# Patient Record
Sex: Female | Born: 1985 | Race: White | Hispanic: No | Marital: Married | Smoking: Never smoker
Health system: Southern US, Community
[De-identification: ages and names within clinical notes are randomized; demographics above are authoritative.]

## PROBLEM LIST (undated history)

## (undated) DIAGNOSIS — Z8659 Personal history of other mental and behavioral disorders: Secondary | ICD-10-CM

## (undated) DIAGNOSIS — F32A Depression, unspecified: Secondary | ICD-10-CM

## (undated) DIAGNOSIS — Z905 Acquired absence of kidney: Secondary | ICD-10-CM

## (undated) DIAGNOSIS — O24419 Gestational diabetes mellitus in pregnancy, unspecified control: Secondary | ICD-10-CM

## (undated) DIAGNOSIS — J353 Hypertrophy of tonsils with hypertrophy of adenoids: Secondary | ICD-10-CM

## (undated) DIAGNOSIS — A3790 Whooping cough, unspecified species without pneumonia: Secondary | ICD-10-CM

## (undated) DIAGNOSIS — F419 Anxiety disorder, unspecified: Secondary | ICD-10-CM

## (undated) DIAGNOSIS — Z862 Personal history of diseases of the blood and blood-forming organs and certain disorders involving the immune mechanism: Secondary | ICD-10-CM

## (undated) DIAGNOSIS — M8580 Other specified disorders of bone density and structure, unspecified site: Secondary | ICD-10-CM

## (undated) DIAGNOSIS — I10 Essential (primary) hypertension: Secondary | ICD-10-CM

## (undated) DIAGNOSIS — M1990 Unspecified osteoarthritis, unspecified site: Secondary | ICD-10-CM

## (undated) DIAGNOSIS — N898 Other specified noninflammatory disorders of vagina: Secondary | ICD-10-CM

## (undated) DIAGNOSIS — J329 Chronic sinusitis, unspecified: Secondary | ICD-10-CM

## (undated) DIAGNOSIS — F329 Major depressive disorder, single episode, unspecified: Secondary | ICD-10-CM

## (undated) DIAGNOSIS — N930 Postcoital and contact bleeding: Secondary | ICD-10-CM

## (undated) HISTORY — PX: PARTIAL NEPHRECTOMY: SHX414

## (undated) HISTORY — DX: Other specified noninflammatory disorders of vagina: N89.8

## (undated) HISTORY — DX: Whooping cough, unspecified species without pneumonia: A37.90

## (undated) HISTORY — DX: Postcoital and contact bleeding: N93.0

## (undated) HISTORY — DX: Gestational diabetes mellitus in pregnancy, unspecified control: O24.419

## (undated) HISTORY — DX: Other specified disorders of bone density and structure, unspecified site: M85.80

---
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
## 2007-05-30 ENCOUNTER — Ambulatory Visit: Payer: Self-pay | Admitting: Gastroenterology

## 2010-04-14 ENCOUNTER — Emergency Department (HOSPITAL_COMMUNITY): Admission: EM | Admit: 2010-04-14 | Discharge: 2010-04-14 | Payer: Self-pay | Admitting: Emergency Medicine

## 2010-04-23 ENCOUNTER — Ambulatory Visit (HOSPITAL_COMMUNITY)
Admission: RE | Admit: 2010-04-23 | Discharge: 2010-04-23 | Payer: Self-pay | Source: Home / Self Care | Admitting: General Surgery

## 2010-04-23 HISTORY — PX: CHOLECYSTECTOMY: SHX55

## 2010-07-01 ENCOUNTER — Other Ambulatory Visit: Admission: RE | Admit: 2010-07-01 | Discharge: 2010-07-01 | Payer: Self-pay | Admitting: Obstetrics and Gynecology

## 2011-01-27 ENCOUNTER — Emergency Department (HOSPITAL_COMMUNITY)
Admission: EM | Admit: 2011-01-27 | Discharge: 2011-01-27 | Disposition: A | Discharge status: Home / Self Care | Payer: PRIVATE HEALTH INSURANCE | Attending: Emergency Medicine | Admitting: Emergency Medicine

## 2011-01-27 DIAGNOSIS — H109 Unspecified conjunctivitis: Secondary | ICD-10-CM | POA: Insufficient documentation

## 2011-01-27 DIAGNOSIS — J40 Bronchitis, not specified as acute or chronic: Secondary | ICD-10-CM | POA: Insufficient documentation

## 2011-01-27 DIAGNOSIS — R059 Cough, unspecified: Secondary | ICD-10-CM | POA: Insufficient documentation

## 2011-01-27 DIAGNOSIS — J029 Acute pharyngitis, unspecified: Secondary | ICD-10-CM | POA: Insufficient documentation

## 2011-01-27 DIAGNOSIS — R05 Cough: Secondary | ICD-10-CM | POA: Insufficient documentation

## 2011-01-27 LAB — RAPID STREP SCREEN (MED CTR MEBANE ONLY): Streptococcus, Group A Screen (Direct): NEGATIVE

## 2011-02-21 LAB — BASIC METABOLIC PANEL
BUN: 9 mg/dL (ref 6–23)
CO2: 28 mEq/L (ref 19–32)
Calcium: 9.8 mg/dL (ref 8.4–10.5)
Chloride: 103 mEq/L (ref 96–112)
Creatinine, Ser: 0.73 mg/dL (ref 0.4–1.2)
GFR calc Af Amer: >60 mL/min (ref >60)
GFR calc non Af Amer: >60 mL/min (ref >60)
Glucose, Bld: 93 mg/dL (ref 70–99)
Potassium: 4.2 mEq/L (ref 3.5–5.1)
Sodium: 138 mEq/L (ref 135–145)

## 2011-02-21 LAB — CBC
HCT: 35.7 % — ABNORMAL LOW (ref 36.0–46.0)
Hemoglobin: 12.6 g/dL (ref 12.0–15.0)
MCHC: 35.4 g/dL (ref 30.0–36.0)
MCV: 88.8 fL (ref 78.0–100.0)
Platelets: 279 10*3/uL (ref 150–400)
RBC: 4.02 MIL/uL (ref 3.87–5.11)
RDW: 13.2 % (ref 11.5–15.5)
WBC: 7.9 10*3/uL (ref 4.0–10.5)

## 2011-02-21 LAB — HCG, QUANTITATIVE, PREGNANCY: hCG, Beta Chain, Quant, S: <2 m[IU]/mL (ref <5)

## 2011-02-22 LAB — DIFFERENTIAL
Basophils Absolute: 0 10*3/uL (ref 0.0–0.1)
Basophils Relative: 0 % (ref 0–1)
Eosinophils Absolute: 0 10*3/uL (ref 0.0–0.7)
Eosinophils Relative: 0 % (ref 0–5)
Lymphocytes Relative: 16 % (ref 12–46)
Lymphs Abs: 1.8 10*3/uL (ref 0.7–4.0)
Monocytes Absolute: 0.8 10*3/uL (ref 0.1–1.0)
Monocytes Relative: 7 % (ref 3–12)
Neutro Abs: 8.6 10*3/uL — ABNORMAL HIGH (ref 1.7–7.7)
Neutrophils Relative %: 77 % (ref 43–77)

## 2011-02-22 LAB — URINALYSIS, ROUTINE W REFLEX MICROSCOPIC
Bilirubin Urine: NEGATIVE
Glucose, UA: NEGATIVE mg/dL
Hgb urine dipstick: NEGATIVE
Ketones, ur: NEGATIVE mg/dL
Nitrite: NEGATIVE
Protein, ur: NEGATIVE mg/dL
Specific Gravity, Urine: 1.02 (ref 1.005–1.030)
Urobilinogen, UA: 0.2 mg/dL (ref 0.0–1.0)
pH: 7 (ref 5.0–8.0)

## 2011-02-22 LAB — COMPREHENSIVE METABOLIC PANEL
ALT: 150 U/L — ABNORMAL HIGH (ref 0–35)
AST: 314 U/L — ABNORMAL HIGH (ref 0–37)
Albumin: 4 g/dL (ref 3.5–5.2)
Alkaline Phosphatase: 185 U/L — ABNORMAL HIGH (ref 39–117)
BUN: 16 mg/dL (ref 6–23)
CO2: 23 mEq/L (ref 19–32)
Calcium: 9.3 mg/dL (ref 8.4–10.5)
Chloride: 107 mEq/L (ref 96–112)
Creatinine, Ser: 0.87 mg/dL (ref 0.4–1.2)
GFR calc Af Amer: >60 mL/min (ref >60)
GFR calc non Af Amer: >60 mL/min (ref >60)
Glucose, Bld: 119 mg/dL — ABNORMAL HIGH (ref 70–99)
Potassium: 3.7 mEq/L (ref 3.5–5.1)
Sodium: 140 mEq/L (ref 135–145)
Total Bilirubin: 0.5 mg/dL (ref 0.3–1.2)
Total Protein: 6.7 g/dL (ref 6.0–8.3)

## 2011-02-22 LAB — CBC
HCT: 36.5 % (ref 36.0–46.0)
Hemoglobin: 12.7 g/dL (ref 12.0–15.0)
MCHC: 34.9 g/dL (ref 30.0–36.0)
MCV: 88.8 fL (ref 78.0–100.0)
Platelets: 231 10*3/uL (ref 150–400)
RBC: 4.11 MIL/uL (ref 3.87–5.11)
RDW: 13 % (ref 11.5–15.5)
WBC: 11.2 10*3/uL — ABNORMAL HIGH (ref 4.0–10.5)

## 2011-02-22 LAB — POCT PREGNANCY, URINE: Preg Test, Ur: NEGATIVE

## 2011-02-22 LAB — LIPASE, BLOOD: Lipase: 32 U/L (ref 11–59)

## 2011-04-19 NOTE — Assessment & Plan Note (Signed)
NAMEFYNLEE, ROWLANDS                CHART#:  [id number]   DATE:  05/30/2007                       DOB:  [birth year]   REASON FOR VISIT:  Constipation.   HISTORY OF PRESENT ILLNESS:  [name] is an 25 year old female who has  had constipation since her early teens.  She has no particular triggers.  She has 1-2 bowel movements a week unless she has her menstrual cycle.  Her menstrual cycle occurs every 4 weeks. It usually lasts 5 days.  During her menstrual cycle she has soft stool to watery stool.  No  particular foods cause her to have watery stool.  She only sees blood in  her stool when she strains very hard to have a bowel movement.  When she  does have a bowel movement it comes out like little itty bitty balls.  She has never been tried on any fiber supplements.  She drinks alot of  juices.  She drinks 2 glasses of water a day.  She eats a lot of fresh  fruit like pineapples, plums, peaches and vegetables as well.  She  denies any weight loss.  She has no problem swallowing. She has  heartburn 2 times a week.  She does not take any medicine for it.  She  has no nausea or vomiting.  The constipation can cause moderate to  severe abdominal cramping.  She can have abdominal cramping at night  time.  She has never tried MiraLax.  She did try Dulcolax once.  She  took 2 at night time and had a watery stool the next day.  Otherwise she  does not have any problems with stools at night time.  She has not had  any change in her level of stress.  She does not have any black stools.   PAST MEDICAL HISTORY:  She denies any medical problems or any history of  thyroid disease.   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   ALLERGIES:  Morphine caused rash.   MEDICATIONS:  1. Ortho Tri-Cyclen lo.  2. Claritin.   She denies any consumption of over-the-counter medicines or herbal  supplements.   FAMILY HISTORY:  Her mother has irritable bowel and diverticulitis.  Her  aunts have irritable bowel.  Her grandmother has diverticulosis.  She is  any only child.  Father does not have any medical problems.   SOCIAL HISTORY:  She is engaged.  Her fiance told her that she should  not take laxatives.  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  She denies  any tobacco or alcohol products.   REVIEW OF SYSTEMS:  Per HPI otherwise all systems are negative.   PHYSICAL EXAMINATION:  VITALS:  Weight 128-1/2 pounds.  Height 5 feet 3  inches.  BMI 22.7 (healthy), temperature 98.5, blood pressure 120/70,  pulse 56.  GENERAL:  She is in no apparent distress.  Alert and oriented x4.  HEENT:  Atraumatic, normocephalic.  Pupils equal and reactive to light.  Mouth, no oral lesions.  Posterior pharynx without erythema or exudate.  Mouth has moist mucosa.  NECK:  Has full range of motion, no  lymphadenopathy.  LUNGS:  Clear to auscultation bilaterally.  CARDIOVASCULAR:  Regular rate and rhythm, no murmur, normal S1/S2.  ABDOMEN:  Bowel sounds are present, soft, nontender, nondistended, no  rebound or guarding.  No hepatosplenomegaly, abdominal bruits or  pulsatile masses.  EXTREMITIES:  Without cyanosis, clubbing or edema.  NEURO:  She has no focal or neurologic deficits.   ASSESSMENT:  [name] is an 25 year old female with constipation which  gets better around her menstrual cycle which is consistent with  irritable bowel syndrome.  The differential diagnosis includes bowel  irregularities associated with thyroid disease and a low likelihood of  celiac disease.  It was a pleasure seeing Ms. Hyler in the office today.   PLAN:  1. [name] is given a hand out on high fiber diet.  She is asked to      achieve a goal of 20-30 grams of fiber a day.  She is warned that      some fiber may cause bloating.  She is asked to avoid those fiber      items that may cause bloating.  She is asked  to initiate MiraLax      daily.  If she is not having a bowel movement every other day then      is to increase it to twice a day.  2. She is to ensure that she is drinking 6-8 cups of water or juice a      day.  3. She is to call me in 3 weeks.  If she is not achieving a      satisfactory bowel movement then I will consider adding Amitiza.  4. She has a return patient visit to see me in 6 weeks.  I will call      her with results  of her thyroid test and her serum quantitative      immunoglobulin as well as tissue transglutaminase IGA.       Kassie Mends, M.D.  Electronically Signed     SM/MEDQ  D:  05/30/2007  T:  05/31/2007  Job:  [id number]

## 2011-12-06 DIAGNOSIS — Z862 Personal history of diseases of the blood and blood-forming organs and certain disorders involving the immune mechanism: Secondary | ICD-10-CM

## 2011-12-06 HISTORY — DX: Personal history of diseases of the blood and blood-forming organs and certain disorders involving the immune mechanism: Z86.2

## 2012-07-03 ENCOUNTER — Other Ambulatory Visit (HOSPITAL_COMMUNITY)
Admission: RE | Admit: 2012-07-03 | Discharge: 2012-07-03 | Disposition: A | Discharge status: Home / Self Care | Payer: PRIVATE HEALTH INSURANCE | Source: Ambulatory Visit | Attending: Obstetrics and Gynecology | Admitting: Obstetrics and Gynecology

## 2012-07-03 ENCOUNTER — Other Ambulatory Visit: Payer: Self-pay | Admitting: Adult Health

## 2012-07-03 DIAGNOSIS — Z01419 Encounter for gynecological examination (general) (routine) without abnormal findings: Secondary | ICD-10-CM | POA: Insufficient documentation

## 2013-01-05 DIAGNOSIS — J353 Hypertrophy of tonsils with hypertrophy of adenoids: Secondary | ICD-10-CM

## 2013-01-05 HISTORY — DX: Hypertrophy of tonsils with hypertrophy of adenoids: J35.3

## 2013-01-17 ENCOUNTER — Ambulatory Visit (INDEPENDENT_AMBULATORY_CARE_PROVIDER_SITE_OTHER): Payer: PRIVATE HEALTH INSURANCE | Admitting: Otolaryngology

## 2013-01-24 ENCOUNTER — Ambulatory Visit (INDEPENDENT_AMBULATORY_CARE_PROVIDER_SITE_OTHER): Payer: No Typology Code available for payment source | Admitting: Otolaryngology

## 2013-01-24 DIAGNOSIS — J3501 Chronic tonsillitis: Secondary | ICD-10-CM

## 2013-01-24 DIAGNOSIS — J0100 Acute maxillary sinusitis, unspecified: Secondary | ICD-10-CM

## 2013-01-24 DIAGNOSIS — J310 Chronic rhinitis: Secondary | ICD-10-CM

## 2013-01-25 ENCOUNTER — Encounter (HOSPITAL_BASED_OUTPATIENT_CLINIC_OR_DEPARTMENT_OTHER): Payer: Self-pay | Admitting: *Deleted

## 2013-01-25 DIAGNOSIS — J329 Chronic sinusitis, unspecified: Secondary | ICD-10-CM

## 2013-01-25 HISTORY — DX: Chronic sinusitis, unspecified: J32.9

## 2013-01-29 ENCOUNTER — Ambulatory Visit (HOSPITAL_BASED_OUTPATIENT_CLINIC_OR_DEPARTMENT_OTHER): Payer: No Typology Code available for payment source | Admitting: Anesthesiology

## 2013-01-29 ENCOUNTER — Encounter (HOSPITAL_BASED_OUTPATIENT_CLINIC_OR_DEPARTMENT_OTHER)
Admission: RE | Disposition: A | Discharge status: Home / Self Care | Payer: Self-pay | Source: Ambulatory Visit | Attending: Otolaryngology

## 2013-01-29 ENCOUNTER — Encounter (HOSPITAL_BASED_OUTPATIENT_CLINIC_OR_DEPARTMENT_OTHER): Payer: Self-pay

## 2013-01-29 ENCOUNTER — Ambulatory Visit (HOSPITAL_BASED_OUTPATIENT_CLINIC_OR_DEPARTMENT_OTHER)
Admission: RE | Admit: 2013-01-29 | Discharge: 2013-01-29 | Disposition: A | Discharge status: Home / Self Care | Payer: No Typology Code available for payment source | Source: Ambulatory Visit | Attending: Otolaryngology | Admitting: Otolaryngology

## 2013-01-29 ENCOUNTER — Encounter (HOSPITAL_BASED_OUTPATIENT_CLINIC_OR_DEPARTMENT_OTHER): Payer: Self-pay | Admitting: Anesthesiology

## 2013-01-29 DIAGNOSIS — F3289 Other specified depressive episodes: Secondary | ICD-10-CM | POA: Insufficient documentation

## 2013-01-29 DIAGNOSIS — J3501 Chronic tonsillitis: Secondary | ICD-10-CM | POA: Insufficient documentation

## 2013-01-29 DIAGNOSIS — J029 Acute pharyngitis, unspecified: Secondary | ICD-10-CM | POA: Insufficient documentation

## 2013-01-29 DIAGNOSIS — F329 Major depressive disorder, single episode, unspecified: Secondary | ICD-10-CM | POA: Insufficient documentation

## 2013-01-29 DIAGNOSIS — D649 Anemia, unspecified: Secondary | ICD-10-CM | POA: Insufficient documentation

## 2013-01-29 DIAGNOSIS — J329 Chronic sinusitis, unspecified: Secondary | ICD-10-CM | POA: Insufficient documentation

## 2013-01-29 DIAGNOSIS — Z9089 Acquired absence of other organs: Secondary | ICD-10-CM

## 2013-01-29 HISTORY — DX: Personal history of other mental and behavioral disorders: Z86.59

## 2013-01-29 HISTORY — DX: Acquired absence of kidney: Z90.5

## 2013-01-29 HISTORY — DX: Hypertrophy of tonsils with hypertrophy of adenoids: J35.3

## 2013-01-29 HISTORY — PX: TONSILLECTOMY AND ADENOIDECTOMY: SHX28

## 2013-01-29 HISTORY — DX: Personal history of diseases of the blood and blood-forming organs and certain disorders involving the immune mechanism: Z86.2

## 2013-01-29 HISTORY — DX: Chronic sinusitis, unspecified: J32.9

## 2013-01-29 LAB — POCT HEMOGLOBIN-HEMACUE: Hemoglobin: 13.6 g/dL (ref 12.0–15.0)

## 2013-01-29 SURGERY — TONSILLECTOMY AND ADENOIDECTOMY
Anesthesia: General | Site: Throat | Wound class: Clean Contaminated

## 2013-01-29 MED ORDER — FENTANYL CITRATE 0.05 MG/ML IJ SOLN
INTRAMUSCULAR | Status: DC | PRN
  Administered 2013-01-29: 100 ug via INTRAVENOUS

## 2013-01-29 MED ORDER — LACTATED RINGERS IV SOLN
INTRAVENOUS | Status: DC
  Administered 2013-01-29: 20 mL/h via INTRAVENOUS
  Administered 2013-01-29: 08:00:00 via INTRAVENOUS

## 2013-01-29 MED ORDER — PROPOFOL 10 MG/ML IV BOLUS
INTRAVENOUS | Status: DC | PRN
  Administered 2013-01-29: 120 mg via INTRAVENOUS

## 2013-01-29 MED ORDER — OXYCODONE HCL 5 MG/5ML PO SOLN
5.0000 mg | Freq: Once | ORAL | Status: AC | PRN
  Administered 2013-01-29: 5 mg via ORAL

## 2013-01-29 MED ORDER — OXYCODONE-ACETAMINOPHEN 5-325 MG/5ML PO SOLN: 5.0000 mL | ORAL | Status: DC | PRN

## 2013-01-29 MED ORDER — SODIUM CHLORIDE 0.9 % IR SOLN
Status: DC | PRN
  Administered 2013-01-29: 1

## 2013-01-29 MED ORDER — BACITRACIN ZINC 500 UNIT/GM EX OINT
TOPICAL_OINTMENT | CUTANEOUS | Status: DC | PRN
  Administered 2013-01-29: 1 via TOPICAL

## 2013-01-29 MED ORDER — MIDAZOLAM HCL 2 MG/ML PO SYRP: 12.0000 mg | ORAL_SOLUTION | Freq: Once | ORAL | Status: DC | PRN

## 2013-01-29 MED ORDER — HYDROMORPHONE HCL PF 1 MG/ML IJ SOLN
0.2500 mg | INTRAMUSCULAR | Status: DC | PRN
  Administered 2013-01-29 (×2): 0.5 mg via INTRAVENOUS

## 2013-01-29 MED ORDER — DEXAMETHASONE SODIUM PHOSPHATE 4 MG/ML IJ SOLN
INTRAMUSCULAR | Status: DC | PRN
  Administered 2013-01-29: 10 mg via INTRAVENOUS

## 2013-01-29 MED ORDER — FENTANYL CITRATE 0.05 MG/ML IJ SOLN: 50.0000 ug | INTRAMUSCULAR | Status: DC | PRN

## 2013-01-29 MED ORDER — ONDANSETRON HCL 4 MG/2ML IJ SOLN: 4.0000 mg | Freq: Once | INTRAMUSCULAR | Status: DC | PRN

## 2013-01-29 MED ORDER — OXYMETAZOLINE HCL 0.05 % NA SOLN
NASAL | Status: DC | PRN
  Administered 2013-01-29: 1 via NASAL

## 2013-01-29 MED ORDER — ONDANSETRON HCL 4 MG/2ML IJ SOLN
INTRAMUSCULAR | Status: DC | PRN
  Administered 2013-01-29: 4 mg via INTRAVENOUS

## 2013-01-29 MED ORDER — MIDAZOLAM HCL 5 MG/5ML IJ SOLN
INTRAMUSCULAR | Status: DC | PRN
  Administered 2013-01-29: 2 mg via INTRAVENOUS

## 2013-01-29 MED ORDER — SUCCINYLCHOLINE CHLORIDE 20 MG/ML IJ SOLN
INTRAMUSCULAR | Status: DC | PRN
  Administered 2013-01-29: 100 mg via INTRAVENOUS

## 2013-01-29 MED ORDER — MIDAZOLAM HCL 2 MG/2ML IJ SOLN: 1.0000 mg | INTRAMUSCULAR | Status: DC | PRN

## 2013-01-29 MED ORDER — OXYCODONE HCL 5 MG PO TABS: 5.0000 mg | ORAL_TABLET | Freq: Once | ORAL | Status: AC | PRN

## 2013-01-29 SURGICAL SUPPLY — 34 items
BANDAGE COBAN STERILE 2 (GAUZE/BANDAGES/DRESSINGS) IMPLANT
CANISTER SUCTION 1200CC (MISCELLANEOUS) ×2 IMPLANT
CATH ROBINSON RED A/P 10FR (CATHETERS) IMPLANT
CATH ROBINSON RED A/P 14FR (CATHETERS) IMPLANT
CLOTH BEACON ORANGE TIMEOUT ST (SAFETY) ×2 IMPLANT
COAGULATOR SUCT SWTCH 10FR 6 (ELECTROSURGICAL) IMPLANT
COVER MAYO STAND STRL (DRAPES) ×2 IMPLANT
ELECT REM PT RETURN 9FT ADLT (ELECTROSURGICAL) ×2
ELECT REM PT RETURN 9FT PED (ELECTROSURGICAL)
ELECTRODE REM PT RETRN 9FT PED (ELECTROSURGICAL) IMPLANT
ELECTRODE REM PT RTRN 9FT ADLT (ELECTROSURGICAL) IMPLANT
GAUZE SPONGE 4X4 12PLY STRL LF (GAUZE/BANDAGES/DRESSINGS) ×2 IMPLANT
GLOVE BIO SURGEON STRL SZ7 (GLOVE) ×1 IMPLANT
GLOVE BIO SURGEON STRL SZ7.5 (GLOVE) ×2 IMPLANT
GLOVE BIOGEL PI IND STRL 7.0 (GLOVE) IMPLANT
GLOVE BIOGEL PI INDICATOR 7.0 (GLOVE) ×1
GLOVE SKINSENSE NS SZ7.0 (GLOVE) ×1
GLOVE SKINSENSE STRL SZ7.0 (GLOVE) IMPLANT
GOWN PREVENTION PLUS XLARGE (GOWN DISPOSABLE) ×4 IMPLANT
IV NS 500ML (IV SOLUTION) ×2
IV NS 500ML BAXH (IV SOLUTION) ×1 IMPLANT
MARKER SKIN DUAL TIP RULER LAB (MISCELLANEOUS) IMPLANT
NS IRRIG 1000ML POUR BTL (IV SOLUTION) ×2 IMPLANT
SHEET MEDIUM DRAPE 40X70 STRL (DRAPES) ×2 IMPLANT
SOLUTION BUTLER CLEAR DIP (MISCELLANEOUS) ×3 IMPLANT
SPONGE TONSIL 1 RF SGL (DISPOSABLE) IMPLANT
SPONGE TONSIL 1.25 RF SGL STRG (GAUZE/BANDAGES/DRESSINGS) ×1 IMPLANT
SYR BULB 3OZ (MISCELLANEOUS) IMPLANT
TOWEL OR 17X24 6PK STRL BLUE (TOWEL DISPOSABLE) ×2 IMPLANT
TUBE CONNECTING 20X1/4 (TUBING) ×2 IMPLANT
TUBE SALEM SUMP 12R W/ARV (TUBING) IMPLANT
TUBE SALEM SUMP 16 FR W/ARV (TUBING) IMPLANT
WAND COBLATOR 70 EVAC XTRA (SURGICAL WAND) ×2 IMPLANT
WATER STERILE IRR 1000ML POUR (IV SOLUTION) ×1 IMPLANT

## 2013-01-29 NOTE — Anesthesia Postprocedure Evaluation (Signed)
  Anesthesia Post-op Note  Patient: Sandra Glover  Procedure(s) Performed: Procedure(s): TONSILLECTOMY AND ADENOIDECTOMY (N/A)  Patient Location: PACU  Anesthesia Type:General  Level of Consciousness: awake and alert   Airway and Oxygen Therapy: Patient Spontanous Breathing and Patient connected to face mask oxygen  Post-op Pain: none  Post-op Assessment: Post-op Vital signs reviewed  Post-op Vital Signs: Reviewed  Complications: No apparent anesthesia complications

## 2013-01-29 NOTE — Transfer of Care (Signed)
Immediate Anesthesia Transfer of Care Note  Patient: Sandra Glover  Procedure(s) Performed: Procedure(s): TONSILLECTOMY AND ADENOIDECTOMY (N/A)  Patient Location: PACU  Anesthesia Type:General  Level of Consciousness: awake, alert  and oriented  Airway & Oxygen Therapy: Patient Spontanous Breathing and Patient connected to face mask oxygen  Post-op Assessment: Report given to PACU RN and Post -op Vital signs reviewed and stable  Post vital signs: Reviewed and stable  Complications: No apparent anesthesia complications

## 2013-01-29 NOTE — H&P (Signed)
  H&P Update  Pt's original H&P dated 01/24/13 reviewed and placed in chart (to be scanned).  I personally examined the patient today.  No change in health. Proceed with adenotonsillectomy.

## 2013-01-29 NOTE — Anesthesia Preprocedure Evaluation (Signed)
Anesthesia Evaluation  Patient identified by MRN, date of birth, ID band Patient awake    Reviewed: Allergy & Precautions, H&P , NPO status , Patient's Chart, lab work & pertinent test results  Airway Mallampati: I TM Distance: >3 FB Neck ROM: Full    Dental  (+) Teeth Intact and Dental Advisory Given   Pulmonary  breath sounds clear to auscultation        Cardiovascular Rhythm:Regular Rate:Normal     Neuro/Psych    GI/Hepatic   Endo/Other    Renal/GU      Musculoskeletal   Abdominal   Peds  Hematology   Anesthesia Other Findings   Reproductive/Obstetrics                           Anesthesia Physical Anesthesia Plan  ASA: I  Anesthesia Plan: General   Post-op Pain Management:    Induction: Intravenous  Airway Management Planned: Oral ETT  Additional Equipment:   Intra-op Plan:   Post-operative Plan: Extubation in OR  Informed Consent: I have reviewed the patients History and Physical, chart, labs and discussed the procedure including the risks, benefits and alternatives for the proposed anesthesia with the patient or authorized representative who has indicated his/her understanding and acceptance.   Dental advisory given  Plan Discussed with: CRNA and Anesthesiologist  Anesthesia Plan Comments:         Anesthesia Quick Evaluation

## 2013-01-29 NOTE — Brief Op Note (Signed)
01/29/2013  9:01 AM  PATIENT:  Sandra Glover  27 y.o. female  PRE-OPERATIVE DIAGNOSIS:  ADENOID/TONSILLAR HYPERTROPHY, CHRONIC TONSILLITIS/PHARYNGITIS  POST-OPERATIVE DIAGNOSIS:  ADENOID/TONSILLAR HYPERTROPHY, CHRONIC TONSILLITIS/PHARYNGITIS  PROCEDURE:  Procedure(s): TONSILLECTOMY AND ADENOIDECTOMY (N/A)  SURGEON:  Surgeon(s) and Role:    * Darletta Moll, MD - Primary  PHYSICIAN ASSISTANT:   ASSISTANTS: none   ANESTHESIA:   general  EBL:  Total I/O In: 700 [I.V.:700] Out: -   BLOOD ADMINISTERED:none  DRAINS: none   LOCAL MEDICATIONS USED:  NONE  SPECIMEN:  Source of Specimen:  bilateral tonsils  DISPOSITION OF SPECIMEN:  PATHOLOGY  COUNTS:  YES  TOURNIQUET:  * No tourniquets in log *  DICTATION: .Note written in EPIC  PLAN OF CARE: Discharge to home after PACU  PATIENT DISPOSITION:  PACU - hemodynamically stable.   Delay start of Pharmacological VTE agent (>24hrs) due to surgical blood loss or risk of bleeding: not applicable

## 2013-01-29 NOTE — Anesthesia Procedure Notes (Signed)
Procedure Name: Intubation Date/Time: 01/29/2013 8:37 AM Performed by: Burna Cash Pre-anesthesia Checklist: Patient identified, Emergency Drugs available, Suction available and Patient being monitored Patient Re-evaluated:Patient Re-evaluated prior to inductionOxygen Delivery Method: Circle System Utilized Preoxygenation: Pre-oxygenation with 100% oxygen Intubation Type: IV induction Ventilation: Mask ventilation without difficulty Laryngoscope Size: Mac and 3 Grade View: Grade I Tube type: Oral Tube size: 7.0 mm Number of attempts: 1 Airway Equipment and Method: stylet and oral airway Placement Confirmation: ETT inserted through vocal cords under direct vision,  positive ETCO2 and breath sounds checked- equal and bilateral Secured at: 20 cm Tube secured with: Tape Dental Injury: Teeth and Oropharynx as per pre-operative assessment

## 2013-01-29 NOTE — Op Note (Signed)
DATE OF PROCEDURE:  01/29/2013                              OPERATIVE REPORT  SURGEON:  Newman Pies, MD  PREOPERATIVE DIAGNOSES: 1. Adenotonsillar hypertrophy. 2. Chronic tonsillitis and pharyngitis  POSTOPERATIVE DIAGNOSES: 1. Adenotonsillar hypertrophy. 2. Chronic tonsillitis and pharyngitis  PROCEDURE PERFORMED:  Adenotonsillectomy.  ANESTHESIA:  General endotracheal tube anesthesia.  COMPLICATIONS:  None.  ESTIMATED BLOOD LOSS:  Minimal.  INDICATION FOR PROCEDURE:  Sandra Glover is a 27 y.o. female with a history of chronic tonsillitis/pharyngitis and halitosis.  According to the patient, she has been experiencing chronic throat discomfort with halitosis for several years. The patient continues to be symptomatic despite medical treatments. On examination, the patient was noted to have bilateral cryptic tonsils, with numerous tonsilloliths. Based on the above findings, the decision was made for the patient to undergo the adenotonsillectomy procedure. Likelihood of success in reducing symptoms was also discussed.  The risks, benefits, alternatives, and details of the procedure were discussed with the mother.  Questions were invited and answered.  Informed consent was obtained.  DESCRIPTION:  The patient was taken to the operating room and placed supine on the operating table.  General endotracheal tube anesthesia was administered by the anesthesiologist.  The patient was positioned and prepped and draped in a standard fashion for adenotonsillectomy.  A Crowe-Davis mouth gag was inserted into the oral cavity for exposure. 2+ cryptic tonsils were noted bilaterally.  No bifidity was noted.  Indirect mirror examination of the nasopharynx revealed mild adenoid hypertrophy. The adenoid was ablated with the Coblator device. Hemostasis was achieved with the Coblator device.  The right tonsil was then grasped with a straight Allis clamp and retracted medially.  It was resected free from the  underlying pharyngeal constrictor muscles with the Coblator device.  The same procedure was repeated on the left side without exception.  The surgical sites were copiously irrigated.  The mouth gag was removed.  The care of the patient was turned over to the anesthesiologist.  The patient was awakened from anesthesia without difficulty.  The patient was extubated and transferred to the recovery room in good condition.  OPERATIVE FINDINGS:  Adenotonsillar hypertrophy.  SPECIMEN:  Bilateral tonsils  FOLLOWUP CARE:  The patient will be discharged home once awake and alert.  She will be placed on amoxicillin 800 mg p.o. b.i.d. for 5 days, and Roxicet 5-1ml po q 4 hours for postop pain control.   The patient will follow up in my office in approximately 2 weeks.  Ananias Kolander,SUI W 01/29/2013 9:03 AM

## 2013-01-30 ENCOUNTER — Encounter (HOSPITAL_BASED_OUTPATIENT_CLINIC_OR_DEPARTMENT_OTHER): Payer: Self-pay | Admitting: Otolaryngology

## 2013-02-14 ENCOUNTER — Ambulatory Visit (INDEPENDENT_AMBULATORY_CARE_PROVIDER_SITE_OTHER): Payer: No Typology Code available for payment source | Admitting: Otolaryngology

## 2013-06-13 ENCOUNTER — Encounter: Payer: Self-pay | Admitting: Obstetrics & Gynecology

## 2013-06-13 ENCOUNTER — Ambulatory Visit (INDEPENDENT_AMBULATORY_CARE_PROVIDER_SITE_OTHER): Payer: No Typology Code available for payment source | Admitting: Obstetrics & Gynecology

## 2013-06-13 VITALS — BP 100/70 | Wt 138.0 lb

## 2013-06-13 DIAGNOSIS — N925 Other specified irregular menstruation: Secondary | ICD-10-CM | POA: Insufficient documentation

## 2013-06-13 DIAGNOSIS — N949 Unspecified condition associated with female genital organs and menstrual cycle: Secondary | ICD-10-CM | POA: Insufficient documentation

## 2013-06-13 DIAGNOSIS — N938 Other specified abnormal uterine and vaginal bleeding: Secondary | ICD-10-CM | POA: Insufficient documentation

## 2013-06-13 LAB — POCT HEMOGLOBIN: Hemoglobin: 11.7 g/dL — AB (ref 12.2–16.2)

## 2013-06-13 MED ORDER — DESOGESTREL-ETHINYL ESTRADIOL 0.15-0.02/0.01 MG (21/5) PO TABS: 1.0000 | ORAL_TABLET | Freq: Every day | ORAL | Status: DC

## 2013-06-13 NOTE — Addendum Note (Signed)
Addended by: Richardson Chiquito on: 06/13/2013 02:52 PM   Modules accepted: Orders

## 2013-06-13 NOTE — Progress Notes (Signed)
Patient ID: Sandra Glover, female   DOB: 03-15-1986, 27 y.o.   MRN: [id number] [name] is in for a followup from bleeding with her nexplanon Essentially for the last 3 months She's been placed on Mircette with positive results over the past 2 weeks She really had been having regular periods prior to that without any problems  She also is having more left lower quadrant pain associated since the bleeding became essentially constant  I can't argue with results if she has not bleeding Mircette or have her continuing for 4 months I would then empirically stop it and see if she'll go back to a more acceptable bleeding pattern  She has any problems with it in the meantime or after she stop she can certainly recontact We will take it out next year in any event at which time she plans and stopping all birth control method

## 2013-06-13 NOTE — Patient Instructions (Signed)

## 2013-07-09 ENCOUNTER — Ambulatory Visit (INDEPENDENT_AMBULATORY_CARE_PROVIDER_SITE_OTHER): Payer: No Typology Code available for payment source | Admitting: Adult Health

## 2013-07-09 ENCOUNTER — Other Ambulatory Visit (HOSPITAL_COMMUNITY)
Admission: RE | Admit: 2013-07-09 | Discharge: 2013-07-09 | Disposition: A | Discharge status: Home / Self Care | Payer: No Typology Code available for payment source | Source: Ambulatory Visit | Attending: Obstetrics and Gynecology | Admitting: Obstetrics and Gynecology

## 2013-07-09 ENCOUNTER — Encounter: Payer: Self-pay | Admitting: Adult Health

## 2013-07-09 VITALS — BP 110/68 | HR 72 | Ht 63.0 in | Wt 138.5 lb

## 2013-07-09 DIAGNOSIS — Z01419 Encounter for gynecological examination (general) (routine) without abnormal findings: Secondary | ICD-10-CM

## 2013-07-09 NOTE — Progress Notes (Signed)
Patient ID: Sandra Glover, female   DOB: 1986/01/27, 27 y.o.   MRN: [id number] History of Present Illness: Sandra Glover is a 27 year old white female in for a pap and physical. No complaints today.   Current Medications, Allergies, Past Medical History, Past Surgical History, Family History and Social History were reviewed in [employer] record.     Review of Systems: Patient denies any headaches, blurred vision, shortness of breath, chest pain, abdominal pain, problems with bowel movements, urination, or intercourse. No joint pain or swelling or mood changes.She had some irregular bleeding with Implanon and was give the pill and the bleeding has stopped.The Implanon is due to be removed in 05/2014.    Physical Exam:BP 110/68  Pulse 72  Ht 5\' 3"  (1.6 m)  Wt 138 lb 8 oz (62.823 kg)  BMI 24.54 kg/m2  LMP 06/22/2013 General:  Well developed, well nourished, no acute distress Skin:  Warm and dry Neck:  Midline trachea, normal thyroid Lungs; Clear to auscultation bilaterally Breast:  No dominant palpable mass, retraction, or nipple discharge Cardiovascular: Regular rate and rhythm Abdomen:  Soft, non tender, no hepatosplenomegaly Pelvic:  External genitalia is normal in appearance.  The vagina is normal in appearance.  The cervix is bulbous.Pap performed with thin prep.  Uterus is felt to be normal size, shape, and contour.  No adnexal masses or tenderness noted. Extremities:  No swelling or varicosities noted Psych: alert and cooperative, seems happy   Impression: Yearly gyn exam    Plan: Physical in 1 year Call prn problems

## 2013-07-09 NOTE — Patient Instructions (Addendum)
Physical in 1 year Get implanon removed 6/15 Call with any problems

## 2013-10-17 ENCOUNTER — Encounter: Payer: Self-pay | Admitting: Obstetrics & Gynecology

## 2013-10-17 ENCOUNTER — Encounter (INDEPENDENT_AMBULATORY_CARE_PROVIDER_SITE_OTHER): Payer: Self-pay

## 2013-10-17 ENCOUNTER — Ambulatory Visit (INDEPENDENT_AMBULATORY_CARE_PROVIDER_SITE_OTHER): Payer: No Typology Code available for payment source | Admitting: Obstetrics & Gynecology

## 2013-10-17 VITALS — BP 100/80 | Wt 138.0 lb

## 2013-10-17 DIAGNOSIS — N925 Other specified irregular menstruation: Secondary | ICD-10-CM

## 2013-10-17 DIAGNOSIS — Z3046 Encounter for surveillance of implantable subdermal contraceptive: Secondary | ICD-10-CM

## 2013-10-17 DIAGNOSIS — N949 Unspecified condition associated with female genital organs and menstrual cycle: Secondary | ICD-10-CM

## 2013-10-17 NOTE — Progress Notes (Signed)
Patient ID: Sandra Glover, female   DOB: [birth year], 27 y.o.   MRN: [id number] Pt wants nexplanon removed, wants to get pregnant!  Left arm prepped 1% lidocaine injected Incision made nexplanon removed without difficulty Dressed and bandaged Follow up prn

## 2013-12-05 NOTE — L&D Delivery Note (Signed)
Attestation of Attending Supervision of Obstetric Fellow: Evaluation and management procedures were performed by the Obstetric Fellow under my supervision and collaboration.  I have reviewed the Obstetric Fellow's note and chart, and I agree with the management and plan.  Williemae Muriel, DO Attending Physician Faculty Practice, [facility]  

## 2013-12-05 NOTE — L&D Delivery Note (Signed)
Delivery Note At 4:03 PM a viable female at [redacted]w[redacted]d after PPROM in clinic was delivered via  (Presentation:LOA  ).  APGAR:  Significant amount of blood noted prior to delivery, concerning for abruption. Placenta status: Intact, Spontaneous Pathology.  Cord:  with the following complications: .   Anesthesia:  None Episiotomy: none Lacerations: none Suture Repair: n/a Est. Blood Loss (mL):   Mom to postpartum.  Baby to NICU. Prior to delivery mother loaded with magnesium, ampicillin started, betamethasone.   Dr. Adrian Blackwater present for entirety of delivery  Kemaria Dedic ROCIO 08/05/2014, 4:21 PM

## 2014-02-17 ENCOUNTER — Other Ambulatory Visit: Payer: Self-pay | Admitting: Obstetrics and Gynecology

## 2014-02-17 DIAGNOSIS — O3680X0 Pregnancy with inconclusive fetal viability, not applicable or unspecified: Secondary | ICD-10-CM

## 2014-02-20 ENCOUNTER — Ambulatory Visit (INDEPENDENT_AMBULATORY_CARE_PROVIDER_SITE_OTHER): Payer: PRIVATE HEALTH INSURANCE

## 2014-02-20 ENCOUNTER — Other Ambulatory Visit: Payer: Self-pay | Admitting: Obstetrics and Gynecology

## 2014-02-20 DIAGNOSIS — O3680X0 Pregnancy with inconclusive fetal viability, not applicable or unspecified: Secondary | ICD-10-CM

## 2014-02-20 NOTE — Progress Notes (Signed)
U/S(6+3wks)-single IUP with +FCA noted, retroverted uterus noted, FHR-119 BPM, cx appears long and closed, bilateral adnexa appears WNL with C.L. Noted on Lt, no free fluid noted within pelvis

## 2014-02-25 ENCOUNTER — Telehealth: Payer: Self-pay | Admitting: Obstetrics and Gynecology

## 2014-02-25 NOTE — Telephone Encounter (Signed)
Pt c/o runny nose and cough, states she has allergies. Per Dr. Despina HiddenEure OK for pt to take claritin at 7 weeks of pregnancy. Pt informed to call our office back if no improvement, verbalized understanding.

## 2014-03-03 ENCOUNTER — Ambulatory Visit (INDEPENDENT_AMBULATORY_CARE_PROVIDER_SITE_OTHER): Payer: PRIVATE HEALTH INSURANCE | Admitting: Women's Health

## 2014-03-03 ENCOUNTER — Encounter: Payer: Self-pay | Admitting: Women's Health

## 2014-03-03 ENCOUNTER — Encounter (INDEPENDENT_AMBULATORY_CARE_PROVIDER_SITE_OTHER): Payer: Self-pay

## 2014-03-03 VITALS — BP 110/68 | Wt 145.0 lb

## 2014-03-03 DIAGNOSIS — Z1389 Encounter for screening for other disorder: Secondary | ICD-10-CM

## 2014-03-03 DIAGNOSIS — Z331 Pregnant state, incidental: Secondary | ICD-10-CM

## 2014-03-03 DIAGNOSIS — F418 Other specified anxiety disorders: Secondary | ICD-10-CM | POA: Insufficient documentation

## 2014-03-03 DIAGNOSIS — Z3480 Encounter for supervision of other normal pregnancy, unspecified trimester: Secondary | ICD-10-CM

## 2014-03-03 LAB — POCT URINALYSIS DIPSTICK
Blood, UA: NEGATIVE
Glucose, UA: NEGATIVE
Ketones, UA: NEGATIVE
Leukocytes, UA: NEGATIVE
Nitrite, UA: NEGATIVE
Protein, UA: NEGATIVE

## 2014-03-03 NOTE — Patient Instructions (Signed)
Pregnancy - First Trimester  During sexual intercourse, millions of sperm go into the vagina. Only 1 sperm will penetrate and fertilize the female egg while it is in the Fallopian tube. One week later, the fertilized egg implants into the wall of the uterus. An embryo begins to develop into a baby. At 6 to 8 weeks, the eyes and face are formed and the heartbeat can be seen on ultrasound. At the end of 12 weeks (first trimester), all the baby's organs are formed. Now that you are pregnant, you will want to do everything you can to have a healthy baby. Two of the most important things are to get good prenatal care and follow your caregiver's instructions. Prenatal care is all the medical care you receive before the baby's birth. It is given to prevent, find, and treat problems during the pregnancy and childbirth.  PRENATAL EXAMS  · During prenatal visits, your weight, blood pressure, and urine are checked. This is done to make sure you are healthy and progressing normally during the pregnancy.  · A pregnant woman should gain 25 to 35 pounds during the pregnancy. However, if you are overweight or underweight, your caregiver will advise you regarding your weight.  · Your caregiver will ask and answer questions for you.  · Blood work, cervical cultures, other necessary tests, and a Pap test are done during your prenatal exams. These tests are done to check on your health and the probable health of your baby. Tests are strongly recommended and done for HIV with your permission. This is the virus that causes AIDS. These tests are done because medicines can be given to help prevent your baby from being born with this infection should you have been infected without knowing it. Blood work is also used to find out your blood type, previous infections, and follow your blood levels (hemoglobin).  · Low hemoglobin (anemia) is common during pregnancy. Iron and vitamins are given to help prevent this. Later in the pregnancy, blood  tests for diabetes will be done along with any other tests if any problems develop.  · You may need other tests to make sure you and the baby are doing well.  CHANGES DURING THE FIRST TRIMESTER   Your body goes through many changes during pregnancy. They vary from person to person. Talk to your caregiver about changes you notice and are concerned about. Changes can include:  · Your menstrual period stops.  · The egg and sperm carry the genes that determine what you look like. Genes from you and your partner are forming a baby. The female genes determine whether the baby is a boy or a girl.  · Your body increases in girth and you may feel bloated.  · Feeling sick to your stomach (nauseous) and throwing up (vomiting). If the vomiting is uncontrollable, call your caregiver.  · Your breasts will begin to enlarge and become tender.  · Your nipples may stick out more and become darker.  · The need to urinate more. Painful urination may mean you have a bladder infection.  · Tiring easily.  · Loss of appetite.  · Cravings for certain kinds of food.  · At first, you may gain or lose a couple of pounds.  · You may have changes in your emotions from day to day (excited to be pregnant or concerned something may go wrong with the pregnancy and baby).  · You may have more vivid and strange dreams.  HOME CARE INSTRUCTIONS   ·   It is very important to avoid all smoking, alcohol and non-prescribed drugs during your pregnancy. These affect the formation and growth of the baby. Avoid chemicals while pregnant to ensure the delivery of a healthy infant.  · Start your prenatal visits by the 12th week of pregnancy. They are usually scheduled monthly at first, then more often in the last 2 months before delivery. Keep your caregiver's appointments. Follow your caregiver's instructions regarding medicine use, blood and lab tests, exercise, and diet.  · During pregnancy, you are providing food for you and your baby. Eat regular, well-balanced  meals. Choose foods such as meat, fish, milk and other low fat dairy products, vegetables, fruits, and whole-grain breads and cereals. Your caregiver will tell you of the ideal weight gain.  · You can help morning sickness by keeping soda crackers at the bedside. Eat a couple before arising in the morning. You may want to use the crackers without salt on them.  · Eating 4 to 5 small meals rather than 3 large meals a day also may help the nausea and vomiting.  · Drinking liquids between meals instead of during meals also seems to help nausea and vomiting.  · A physical sexual relationship may be continued throughout pregnancy if there are no other problems. Problems may be early (premature) leaking of amniotic fluid from the membranes, vaginal bleeding, or belly (abdominal) pain.  · Exercise regularly if there are no restrictions. Check with your caregiver or physical therapist if you are unsure of the safety of some of your exercises. Greater weight gain will occur in the last 2 trimesters of pregnancy. Exercising will help:  · Control your weight.  · Keep you in shape.  · Prepare you for labor and delivery.  · Help you lose your pregnancy weight after you deliver your baby.  · Wear a good support or jogging bra for breast tenderness during pregnancy. This may help if worn during sleep too.  · Ask when prenatal classes are available. Begin classes when they are offered.  · Do not use hot tubs, steam rooms, or saunas.  · Wear your seat belt when driving. This protects you and your baby if you are in an accident.  · Avoid raw meat, uncooked cheese, cat litter boxes, and soil used by cats throughout the pregnancy. These carry germs that can cause birth defects in the baby.  · The first trimester is a good time to visit your dentist for your dental health. Getting your teeth cleaned is okay. Use a softer toothbrush and brush gently during pregnancy.  · Ask for help if you have financial, counseling, or nutritional needs  during pregnancy. Your caregiver will be able to offer counseling for these needs as well as refer you for other special needs.  · Do not take any medicines or herbs unless told by your caregiver.  · Inform your caregiver if there is any mental or physical domestic violence.  · Make a list of emergency phone numbers of family, friends, hospital, and police and fire departments.  · Write down your questions. Take them to your prenatal visit.  · Do not douche.  · Do not cross your legs.  · If you have to stand for long periods of time, rotate you feet or take small steps in a circle.  · You may have more vaginal secretions that may require a sanitary pad. Do not use tampons or scented sanitary pads.  MEDICINES AND DRUG USE IN PREGNANCY  ·   Take prenatal vitamins as directed. The vitamin should contain 1 milligram of folic acid. Keep all vitamins out of reach of children. Only a couple vitamins or tablets containing iron may be fatal to a baby or young child when ingested.  · Avoid use of all medicines, including herbs, over-the-counter medicines, not prescribed or suggested by your caregiver. Only take over-the-counter or prescription medicines for pain, discomfort, or fever as directed by your caregiver. Do not use aspirin, ibuprofen, or naproxen unless directed by your caregiver.  · Let your caregiver also know about herbs you may be using.  · Alcohol is related to a number of birth defects. This includes fetal alcohol syndrome. All alcohol, in any form, should be avoided completely. Smoking will cause low birth rate and premature babies.  · Street or illegal drugs are very harmful to the baby. They are absolutely forbidden. A baby born to an addicted mother will be addicted at birth. The baby will go through the same withdrawal an adult does.  · Let your caregiver know about any medicines that you have to take and for what reason you take them.  SEEK MEDICAL CARE IF:   You have any concerns or worries during your  pregnancy. It is better to call with your questions if you feel they cannot wait, rather than worry about them.  SEEK IMMEDIATE MEDICAL CARE IF:   · An unexplained oral temperature above 102° F (38.9° C) develops, or as your caregiver suggests.  · You have leaking of fluid from the vagina (birth canal). If leaking membranes are suspected, take your temperature and inform your caregiver of this when you call.  · There is vaginal spotting or bleeding. Notify your caregiver of the amount and how many pads are used.  · You develop a bad smelling vaginal discharge with a change in the color.  · You continue to feel sick to your stomach (nauseated) and have no relief from remedies suggested. You vomit blood or coffee ground-like materials.  · You lose more than 2 pounds of weight in 1 week.  · You gain more than 2 pounds of weight in 1 week and you notice swelling of your face, hands, feet, or legs.  · You gain 5 pounds or more in 1 week (even if you do not have swelling of your hands, face, legs, or feet).  · You get exposed to German measles and have never had them.  · You are exposed to fifth disease or chickenpox.  · You develop belly (abdominal) pain. Round ligament discomfort is a common non-cancerous (benign) cause of abdominal pain in pregnancy. Your caregiver still must evaluate this.  · You develop headache, fever, diarrhea, pain with urination, or shortness of breath.  · You fall or are in a car accident or have any kind of trauma.  · There is mental or physical violence in your home.  Document Released: 11/15/2001 Document Revised: 08/15/2012 Document Reviewed: 05/19/2009  [employer]® Patient Information ©2014 [employer].

## 2014-03-03 NOTE — Progress Notes (Signed)
  Subjective:  Sandra Glover is a 28 y.o. 962P1001 Caucasian female at 1730w0d by LMP which correlates exactly w/ 6.3wk u/s, being seen today for her first obstetrical visit.  Her obstetrical history is significant for term uncomplicated SVD x 1.  Pregnancy history fully reviewed. H/O depression/anxiety, on celexa prior to pregnancy, quit w/ +PT. States she feels like she's doing well off meds. Sees mental health provider regularly. To let us know if she feels like she needs to restart.   Patient reports no complaints. Denies vb, cramping, uti s/s, abnormal/malodorous vag d/c, or vulvovaginal itching/irritation.  BP 110/68  Wt 145 lb (65.772 kg)  LMP 01/06/2014  HISTORY: OB History  Gravida Para Term Preterm AB SAB TAB Ectopic Multiple Living  2 1 1       1     # Outcome Date GA Lbr Len/2nd Weight Sex Delivery Anes PTL Lv  2 CUR           1 TRM 02/18/10 2672w0d  7 lb 7 oz (3.374 kg) M SVD EPI  Y     Past Medical History  Diagnosis Date  . History of anemia 2013  . Sinus infection 01/25/2013    started antibiotic 01/24/2013 x 10 days; nasal congestion, clear drainage from nose  . History of depression     states stopped med. in 2013  . Tonsillar and adenoid hypertrophy 01/2013  . 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 Cholecystectomy  04/23/2010    laparoscopic  . Partial nephrectomy Right age 26 mos.  . Tonsillectomy and adenoidectomy N/A 01/29/2013    Procedure: TONSILLECTOMY AND ADENOIDECTOMY;  Surgeon: Darletta MollSui W Teoh, MD;  Location: [facility];  Service: ENT;  Laterality: N/A;   Family History  Problem Relation Age of Onset  . COPD Mother   . Ovarian cancer Maternal Aunt   . Ovarian cancer Maternal Grandmother     Exam   System:     General: Well developed & nourished, no acute distress   Skin: Warm & dry, normal coloration and turgor, no rashes   Neurologic: Alert &  oriented, normal mood   Cardiovascular: Regular rate & rhythm   Respiratory: Effort & rate normal, LCTAB, acyanotic   Abdomen: Soft, non tender   Extremities: normal strength, tone   Thin prep pap smear not obtained, neg 06/2012  FHR: 168 via informal transabdominal u/s   Assessment:   Pregnancy: G2P1001 Patient Active Problem List   Diagnosis Date Noted  . Supervision of other normal pregnancy 03/03/2014    Priority: High    3730w0d G2P1001 New OB visit H/O depression/anxiety- not currently on meds    Plan:  Initial labs drawn Continue prenatal vitamins Problem list reviewed and updated Reviewed n/v relief measures and warning s/s to report Reviewed recommended weight gain based on pre-gravid BMI Encouraged well-balanced diet Genetic Screening discussed Integrated Screen: declined Cystic fibrosis screening discussed requested Ultrasound discussed; fetal survey: requested Follow up in 4 weeks for visit [facility] completed  Marge DuncansBooker, Mikalah Skyles Randall CNM, [facility]NP-BC 03/03/2014 4:34 PM

## 2014-03-04 ENCOUNTER — Encounter: Payer: Self-pay | Admitting: Women's Health

## 2014-03-04 LAB — URINALYSIS
Bilirubin Urine: NEGATIVE
Glucose, UA: NEGATIVE mg/dL
Hgb urine dipstick: NEGATIVE
Ketones, ur: NEGATIVE mg/dL
Leukocytes, UA: NEGATIVE
Nitrite: NEGATIVE
Protein, ur: NEGATIVE mg/dL
Specific Gravity, Urine: 1.029 (ref 1.005–1.030)
Urobilinogen, UA: 1 mg/dL (ref 0.0–1.0)
pH: 7 (ref 5.0–8.0)

## 2014-03-04 LAB — ABO AND RH: Rh Type: POSITIVE

## 2014-03-04 LAB — URINE CULTURE
Colony Count: NO GROWTH
Organism ID, Bacteria: NO GROWTH

## 2014-03-04 LAB — ANTIBODY SCREEN: Antibody Screen: NEGATIVE

## 2014-03-04 LAB — DRUG SCREEN, URINE, NO CONFIRMATION
Amphetamine Screen, Ur: NEGATIVE
Barbiturate Quant, Ur: NEGATIVE
Benzodiazepines.: NEGATIVE
Cocaine Metabolites: NEGATIVE
Creatinine,U: 175.5 mg/dL
Marijuana Metabolite: NEGATIVE
Methadone: NEGATIVE
Opiate Screen, Urine: NEGATIVE
Phencyclidine (PCP): NEGATIVE
Propoxyphene: NEGATIVE

## 2014-03-04 LAB — HIV ANTIBODY (ROUTINE TESTING W REFLEX): HIV: NONREACTIVE

## 2014-03-04 LAB — RPR

## 2014-03-04 LAB — CBC
HCT: 36 % (ref 36.0–46.0)
Hemoglobin: 12.7 g/dL (ref 12.0–15.0)
MCH: 30.9 pg (ref 26.0–34.0)
MCHC: 35.3 g/dL (ref 30.0–36.0)
MCV: 87.6 fL (ref 78.0–100.0)
Platelets: 253 10*3/uL (ref 150–400)
RBC: 4.11 MIL/uL (ref 3.87–5.11)
RDW: 13.3 % (ref 11.5–15.5)
WBC: 11.3 10*3/uL — ABNORMAL HIGH (ref 4.0–10.5)

## 2014-03-04 LAB — GC/CHLAMYDIA PROBE AMP
CT Probe RNA: NEGATIVE
GC Probe RNA: NEGATIVE

## 2014-03-04 LAB — VARICELLA ZOSTER ANTIBODY, IGG: Varicella IgG: 841.7 Index — ABNORMAL HIGH (ref <135.00)

## 2014-03-04 LAB — HEPATITIS B SURFACE ANTIGEN: Hepatitis B Surface Ag: NEGATIVE

## 2014-03-04 LAB — RUBELLA SCREEN: Rubella: 1.91 Index — ABNORMAL HIGH (ref <0.90)

## 2014-03-04 LAB — OXYCODONE SCREEN, UA, RFLX CONFIRM: Oxycodone Screen, Ur: NEGATIVE ng/mL

## 2014-03-05 LAB — CYSTIC FIBROSIS DIAGNOSTIC STUDY: Result Summary:: DETECTED — AB

## 2014-03-08 ENCOUNTER — Encounter: Payer: Self-pay | Admitting: Women's Health

## 2014-03-08 DIAGNOSIS — Z141 Cystic fibrosis carrier: Secondary | ICD-10-CM

## 2014-03-08 DIAGNOSIS — O09899 Supervision of other high risk pregnancies, unspecified trimester: Secondary | ICD-10-CM | POA: Insufficient documentation

## 2014-03-10 ENCOUNTER — Telehealth: Payer: Self-pay | Admitting: Women's Health

## 2014-03-10 DIAGNOSIS — Z141 Cystic fibrosis carrier: Principal | ICD-10-CM

## 2014-03-10 DIAGNOSIS — O09899 Supervision of other high risk pregnancies, unspecified trimester: Secondary | ICD-10-CM

## 2014-03-10 NOTE — Telephone Encounter (Signed)
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  Cheral MarkerKimberly R. Tae Vonada, CNM, [facility]NP-BC 03/10/2014 5:39 PM

## 2014-03-24 ENCOUNTER — Ambulatory Visit (HOSPITAL_COMMUNITY): Payer: No Typology Code available for payment source | Admitting: Psychiatry

## 2014-03-25 ENCOUNTER — Encounter: Payer: Self-pay | Admitting: Adult Health

## 2014-03-25 ENCOUNTER — Ambulatory Visit (INDEPENDENT_AMBULATORY_CARE_PROVIDER_SITE_OTHER): Payer: PRIVATE HEALTH INSURANCE | Admitting: Adult Health

## 2014-03-25 ENCOUNTER — Telehealth: Payer: Self-pay | Admitting: Obstetrics and Gynecology

## 2014-03-25 VITALS — BP 118/76 | Wt 144.5 lb

## 2014-03-25 DIAGNOSIS — Z3480 Encounter for supervision of other normal pregnancy, unspecified trimester: Secondary | ICD-10-CM

## 2014-03-25 DIAGNOSIS — Z141 Cystic fibrosis carrier: Principal | ICD-10-CM

## 2014-03-25 DIAGNOSIS — Z331 Pregnant state, incidental: Secondary | ICD-10-CM

## 2014-03-25 DIAGNOSIS — O09899 Supervision of other high risk pregnancies, unspecified trimester: Secondary | ICD-10-CM

## 2014-03-25 DIAGNOSIS — B373 Candidiasis of vulva and vagina: Secondary | ICD-10-CM

## 2014-03-25 DIAGNOSIS — N898 Other specified noninflammatory disorders of vagina: Secondary | ICD-10-CM | POA: Insufficient documentation

## 2014-03-25 DIAGNOSIS — B3731 Acute candidiasis of vulva and vagina: Secondary | ICD-10-CM

## 2014-03-25 DIAGNOSIS — Z1389 Encounter for screening for other disorder: Secondary | ICD-10-CM

## 2014-03-25 HISTORY — DX: Other specified noninflammatory disorders of vagina: N89.8

## 2014-03-25 LAB — POCT URINALYSIS DIPSTICK
Blood, UA: NEGATIVE
Glucose, UA: NEGATIVE
Ketones, UA: NEGATIVE
Leukocytes, UA: NEGATIVE
Nitrite, UA: NEGATIVE

## 2014-03-25 LAB — POCT WET PREP (WET MOUNT)

## 2014-03-25 NOTE — Telephone Encounter (Signed)
Pt thinks she may have a yeast infection. Pt states that she is having a vaginal discharge for about 3 days. Pt given an appointment for today to see provider.

## 2014-03-25 NOTE — Progress Notes (Signed)
Pt states that she has had a vaginal discharge and irritation for about 2 days. Pt states she is also having burning with urination.

## 2014-03-25 NOTE — Progress Notes (Signed)
Has history of frequent yeast infection in past, has discharge and irritation, on exam labia has some redness and there is a white clumpy discharge, wet prep +few yeast buds and lactobacillus. Try yogurt and bath with bar soap, keep dry,return next week as scheduled.

## 2014-03-25 NOTE — Patient Instructions (Signed)
Monilial Vaginitis Vaginitis in a soreness, swelling and redness (inflammation) of the vagina and vulva. Monilial vaginitis is not a sexually transmitted infection. CAUSES  Yeast vaginitis is caused by yeast (candida) that is normally found in your vagina. With a yeast infection, the candida has overgrown in number to a point that upsets the chemical balance. SYMPTOMS   White, thick vaginal discharge.  Swelling, itching, redness and irritation of the vagina and possibly the lips of the vagina (vulva).  Burning or painful urination.  Painful intercourse. DIAGNOSIS  Things that may contribute to monilial vaginitis are:  Postmenopausal and virginal states.  Pregnancy.  Infections.  Being tired, sick or stressed, especially if you had monilial vaginitis in the past.  Diabetes. Good control will help lower the chance.  Birth control pills.  Tight fitting garments.  Using bubble bath, feminine sprays, douches or deodorant tampons.  Taking certain medications that kill germs (antibiotics).  Sporadic recurrence can occur if you become ill. TREATMENT  Your caregiver will give you medication.  There are several kinds of anti monilial vaginal creams and suppositories specific for monilial vaginitis. For recurrent yeast infections, use a suppository or cream in the vagina 2 times a week, or as directed.  Anti-monilial or steroid cream for the itching or irritation of the vulva may also be used. Get your caregiver's permission.  Painting the vagina with methylene blue solution may help if the monilial cream does not work.  Eating yogurt may help prevent monilial vaginitis. HOME CARE INSTRUCTIONS   Finish all medication as prescribed.  Do not have sex until treatment is completed or after your caregiver tells you it is okay.  Take warm sitz baths.  Do not douche.  Do not use tampons, especially scented ones.  Wear cotton underwear.  Avoid tight pants and panty  hose.  Tell your sexual partner that you have a yeast infection. They should go to their caregiver if they have symptoms such as mild rash or itching.  Your sexual partner should be treated as well if your infection is difficult to eliminate.  Practice safer sex. Use condoms.  Some vaginal medications cause latex condoms to fail. Vaginal medications that harm condoms are:  Cleocin cream.  Butoconazole (Femstat).  Terconazole (Terazol) vaginal suppository.  Miconazole (Monistat) (may be purchased over the counter). SEEK MEDICAL CARE IF:   You have a temperature by mouth above 102 F (38.9 C).  The infection is getting worse after 2 days of treatment.  The infection is not getting better after 3 days of treatment.  You develop blisters in or around your vagina.  You develop vaginal bleeding, and it is not your menstrual period.  You have pain when you urinate.  You develop intestinal problems.  You have pain with sexual intercourse. Document Released: 08/31/2005 Document Revised: 02/13/2012 Document Reviewed: 05/15/2009 [facility] Patient Information 2014 [place], [place]. Eat yogurt Keep dry and use bar soap Return as scheduled

## 2014-03-27 ENCOUNTER — Encounter (HOSPITAL_COMMUNITY): Payer: Self-pay | Admitting: Psychiatry

## 2014-03-27 ENCOUNTER — Ambulatory Visit (INDEPENDENT_AMBULATORY_CARE_PROVIDER_SITE_OTHER): Payer: No Typology Code available for payment source | Admitting: Psychiatry

## 2014-03-27 VITALS — BP 120/84 | Ht 63.0 in | Wt 142.0 lb

## 2014-03-27 DIAGNOSIS — F329 Major depressive disorder, single episode, unspecified: Secondary | ICD-10-CM

## 2014-03-27 DIAGNOSIS — F332 Major depressive disorder, recurrent severe without psychotic features: Secondary | ICD-10-CM

## 2014-03-27 MED ORDER — CITALOPRAM HYDROBROMIDE 20 MG PO TABS: 20.0000 mg | ORAL_TABLET | Freq: Every day | ORAL | Status: DC
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
## 2014-03-31 ENCOUNTER — Ambulatory Visit (INDEPENDENT_AMBULATORY_CARE_PROVIDER_SITE_OTHER): Payer: PRIVATE HEALTH INSURANCE | Admitting: Obstetrics & Gynecology

## 2014-03-31 ENCOUNTER — Telehealth (HOSPITAL_COMMUNITY): Payer: Self-pay | Admitting: *Deleted

## 2014-03-31 VITALS — BP 108/64 | Wt 145.0 lb

## 2014-03-31 DIAGNOSIS — Z331 Pregnant state, incidental: Secondary | ICD-10-CM

## 2014-03-31 DIAGNOSIS — Z1389 Encounter for screening for other disorder: Secondary | ICD-10-CM

## 2014-03-31 DIAGNOSIS — Z3480 Encounter for supervision of other normal pregnancy, unspecified trimester: Secondary | ICD-10-CM

## 2014-03-31 LAB — POCT URINALYSIS DIPSTICK
Blood, UA: NEGATIVE
Glucose, UA: NEGATIVE
Ketones, UA: NEGATIVE
Leukocytes, UA: NEGATIVE
Nitrite, UA: NEGATIVE
Protein, UA: NEGATIVE

## 2014-03-31 NOTE — Progress Notes (Signed)
Dad is doing CF today Declines NT and IT testing  No bleeding no nausea

## 2014-04-17 ENCOUNTER — Ambulatory Visit (INDEPENDENT_AMBULATORY_CARE_PROVIDER_SITE_OTHER): Payer: No Typology Code available for payment source | Admitting: Psychiatry

## 2014-04-17 DIAGNOSIS — F339 Major depressive disorder, recurrent, unspecified: Secondary | ICD-10-CM
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
## 2014-04-17 NOTE — Patient Instructions (Signed)
Discussed orally 

## 2014-04-24 ENCOUNTER — Ambulatory Visit (INDEPENDENT_AMBULATORY_CARE_PROVIDER_SITE_OTHER): Payer: No Typology Code available for payment source | Admitting: Psychiatry

## 2014-04-24 ENCOUNTER — Encounter (HOSPITAL_COMMUNITY): Payer: Self-pay | Admitting: Psychiatry

## 2014-04-24 VITALS — BP 110/68 | Ht 63.0 in | Wt 147.0 lb

## 2014-04-24 DIAGNOSIS — F339 Major depressive disorder, recurrent, unspecified: Secondary | ICD-10-CM

## 2014-04-24 DIAGNOSIS — F332 Major depressive disorder, recurrent severe without psychotic features: Secondary | ICD-10-CM
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
## 2014-04-30 ENCOUNTER — Encounter: Payer: Self-pay | Admitting: Women's Health

## 2014-04-30 ENCOUNTER — Ambulatory Visit (INDEPENDENT_AMBULATORY_CARE_PROVIDER_SITE_OTHER): Payer: PRIVATE HEALTH INSURANCE | Admitting: Women's Health

## 2014-04-30 VITALS — BP 112/60 | Wt 149.0 lb

## 2014-04-30 DIAGNOSIS — Z331 Pregnant state, incidental: Secondary | ICD-10-CM

## 2014-04-30 DIAGNOSIS — F418 Other specified anxiety disorders: Secondary | ICD-10-CM

## 2014-04-30 DIAGNOSIS — Z1389 Encounter for screening for other disorder: Secondary | ICD-10-CM

## 2014-04-30 DIAGNOSIS — O09899 Supervision of other high risk pregnancies, unspecified trimester: Secondary | ICD-10-CM

## 2014-04-30 DIAGNOSIS — Z3480 Encounter for supervision of other normal pregnancy, unspecified trimester: Secondary | ICD-10-CM

## 2014-04-30 DIAGNOSIS — Z141 Cystic fibrosis carrier: Secondary | ICD-10-CM

## 2014-04-30 LAB — POCT URINALYSIS DIPSTICK
Blood, UA: NEGATIVE
Glucose, UA: NEGATIVE
Ketones, UA: NEGATIVE
Leukocytes, UA: NEGATIVE
Nitrite, UA: NEGATIVE
Protein, UA: NEGATIVE

## 2014-04-30 MED ORDER — CONCEPT DHA 53.5-38-1 MG PO CAPS: 1.0000 | ORAL_CAPSULE | Freq: Every day | ORAL | Status: DC

## 2014-04-30 NOTE — Progress Notes (Signed)
Denies cramping, lof, vb, uti s/s.  Feeling some flutters. Occ dizzy spells when in shower. Probable physiological 2nd trimester bp drop combined w/ hot water, to turn water down some.  Reviewed warning s/s to report.  All questions answered. F/U in 4wks for anatomy u/s and visit.

## 2014-04-30 NOTE — Patient Instructions (Signed)
Second Trimester of Pregnancy The second trimester is from week 13 through week 28, months 4 through 6. The second trimester is often a time when you feel your best. Your body has also adjusted to being pregnant, and you begin to feel better physically. Usually, morning sickness has lessened or quit completely, you may have more energy, and you may have an increase in appetite. The second trimester is also a time when the fetus is growing rapidly. At the end of the sixth month, the fetus is about 9 inches long and weighs about 1 pounds. You will likely begin to feel the baby move (quickening) between 18 and 20 weeks of the pregnancy. BODY CHANGES Your body goes through many changes during pregnancy. The changes vary from woman to woman.   Your weight will continue to increase. You will notice your lower abdomen bulging out.  You may begin to get stretch marks on your hips, abdomen, and breasts.  You may develop headaches that can be relieved by medicines approved by your caregiver.  You may urinate more often because the fetus is pressing on your bladder.  You may develop or continue to have heartburn as a result of your pregnancy.  You may develop constipation because certain hormones are causing the muscles that push waste through your intestines to slow down.  You may develop hemorrhoids or swollen, bulging veins (varicose veins).  You may have back pain because of the weight gain and pregnancy hormones relaxing your joints between the bones in your pelvis and as a result of a shift in weight and the muscles that support your balance.  Your breasts will continue to grow and be tender.  Your gums may bleed and may be sensitive to brushing and flossing.  Dark spots or blotches (chloasma, mask of pregnancy) may develop on your face. This will likely fade after the baby is born.  A dark line from your belly button to the pubic area (linea nigra) may appear. This will likely fade after the  baby is born. WHAT TO EXPECT AT YOUR PRENATAL VISITS During a routine prenatal visit:  You will be weighed to make sure you and the fetus are growing normally.  Your blood pressure will be taken.  Your abdomen will be measured to track your baby's growth.  The fetal heartbeat will be listened to.  Any test results from the previous visit will be discussed. Your caregiver may ask you:  How you are feeling.  If you are feeling the baby move.  If you have had any abnormal symptoms, such as leaking fluid, bleeding, severe headaches, or abdominal cramping.  If you have any questions. Other tests that may be performed during your second trimester include:  Blood tests that check for:  Low iron levels (anemia).  Gestational diabetes (between 24 and 28 weeks).  Rh antibodies.  Urine tests to check for infections, diabetes, or protein in the urine.  An ultrasound to confirm the proper growth and development of the baby.  An amniocentesis to check for possible genetic problems.  Fetal screens for spina bifida and Down syndrome. HOME CARE INSTRUCTIONS   Avoid all smoking, herbs, alcohol, and unprescribed drugs. These chemicals affect the formation and growth of the baby.  Follow your caregiver's instructions regarding medicine use. There are medicines that are either safe or unsafe to take during pregnancy.  Exercise only as directed by your caregiver. Experiencing uterine cramps is a good sign to stop exercising.  Continue to eat regular,   healthy meals.  Wear a good support bra for breast tenderness.  Do not use hot tubs, steam rooms, or saunas.  Wear your seat belt at all times when driving.  Avoid raw meat, uncooked cheese, cat litter boxes, and soil used by cats. These carry germs that can cause birth defects in the baby.  Take your prenatal vitamins.  Try taking a stool softener (if your caregiver approves) if you develop constipation. Eat more high-fiber foods,  such as fresh vegetables or fruit and whole grains. Drink plenty of fluids to keep your urine clear or pale yellow.  Take warm sitz baths to soothe any pain or discomfort caused by hemorrhoids. Use hemorrhoid cream if your caregiver approves.  If you develop varicose veins, wear support hose. Elevate your feet for 15 minutes, 3 4 times a day. Limit salt in your diet.  Avoid heavy lifting, wear low heel shoes, and practice good posture.  Rest with your legs elevated if you have leg cramps or low back pain.  Visit your dentist if you have not gone yet during your pregnancy. Use a soft toothbrush to brush your teeth and be gentle when you floss.  A sexual relationship may be continued unless your caregiver directs you otherwise.  Continue to go to all your prenatal visits as directed by your caregiver. SEEK MEDICAL CARE IF:   You have dizziness.  You have mild pelvic cramps, pelvic pressure, or nagging pain in the abdominal area.  You have persistent nausea, vomiting, or diarrhea.  You have a bad smelling vaginal discharge.  You have pain with urination. SEEK IMMEDIATE MEDICAL CARE IF:   You have a fever.  You are leaking fluid from your vagina.  You have spotting or bleeding from your vagina.  You have severe abdominal cramping or pain.  You have rapid weight gain or loss.  You have shortness of breath with chest pain.  You notice sudden or extreme swelling of your face, hands, ankles, feet, or legs.  You have not felt your baby move in over an hour.  You have severe headaches that do not go away with medicine.  You have vision changes. Document Released: 11/15/2001 Document Revised: 07/24/2013 Document Reviewed: 01/22/2013 Exit[employer] Patient Information 2014 [employer].  

## 2014-05-06 ENCOUNTER — Telehealth (HOSPITAL_COMMUNITY): Payer: Self-pay | Admitting: *Deleted

## 2014-05-07 ENCOUNTER — Encounter: Payer: Self-pay | Admitting: Women's Health

## 2014-05-07 DIAGNOSIS — O2342 Unspecified infection of urinary tract in pregnancy, second trimester: Secondary | ICD-10-CM | POA: Insufficient documentation

## 2014-05-16 ENCOUNTER — Ambulatory Visit (INDEPENDENT_AMBULATORY_CARE_PROVIDER_SITE_OTHER): Payer: No Typology Code available for payment source | Admitting: Psychiatry

## 2014-05-16 DIAGNOSIS — F339 Major depressive disorder, recurrent, unspecified: Secondary | ICD-10-CM
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
## 2014-05-16 NOTE — Patient Instructions (Signed)
Discussed orally 

## 2014-05-22 ENCOUNTER — Ambulatory Visit (HOSPITAL_COMMUNITY): Payer: Self-pay | Admitting: Psychiatry

## 2014-05-23 ENCOUNTER — Ambulatory Visit (HOSPITAL_COMMUNITY): Payer: Self-pay | Admitting: Psychiatry

## 2014-05-28 ENCOUNTER — Ambulatory Visit (INDEPENDENT_AMBULATORY_CARE_PROVIDER_SITE_OTHER): Payer: PRIVATE HEALTH INSURANCE

## 2014-05-28 ENCOUNTER — Ambulatory Visit (INDEPENDENT_AMBULATORY_CARE_PROVIDER_SITE_OTHER): Payer: PRIVATE HEALTH INSURANCE | Admitting: Advanced Practice Midwife

## 2014-05-28 ENCOUNTER — Encounter: Payer: Self-pay | Admitting: Advanced Practice Midwife

## 2014-05-28 VITALS — BP 108/60 | Wt 152.0 lb

## 2014-05-28 DIAGNOSIS — Z331 Pregnant state, incidental: Secondary | ICD-10-CM

## 2014-05-28 DIAGNOSIS — Z1389 Encounter for screening for other disorder: Secondary | ICD-10-CM

## 2014-05-28 DIAGNOSIS — Z3480 Encounter for supervision of other normal pregnancy, unspecified trimester: Secondary | ICD-10-CM

## 2014-05-28 DIAGNOSIS — N390 Urinary tract infection, site not specified: Secondary | ICD-10-CM

## 2014-05-28 DIAGNOSIS — O2390 Unspecified genitourinary tract infection in pregnancy, unspecified trimester: Secondary | ICD-10-CM

## 2014-05-28 DIAGNOSIS — Z3482 Encounter for supervision of other normal pregnancy, second trimester: Secondary | ICD-10-CM

## 2014-05-28 LAB — POCT URINALYSIS DIPSTICK
Blood, UA: NEGATIVE
Glucose, UA: NEGATIVE
Ketones, UA: NEGATIVE
Leukocytes, UA: NEGATIVE
Nitrite, UA: NEGATIVE
Protein, UA: NEGATIVE

## 2014-05-28 NOTE — Progress Notes (Signed)
G2P1001 [id number] Estimated Date of Delivery: 10/13/14  Blood pressure 108/60, weight 152 lb (68.947 kg), last menstrual period 01/06/2014.   BP weight and urine results all reviewed and noted.  Please refer to the obstetrical flow sheet for the fundal height and fetal heart rate documentation: Had anatomy scan today, all normal  Patient reports good fetal movement, denies any bleeding and no rupture of membranes symptoms or regular contractions. Patient is without complaints. All questions were answered.  Plan:  Continued routine obstetrical care,   Follow up in 4 weeks for OB appointment,

## 2014-05-28 NOTE — Progress Notes (Signed)
U/S(20+2wks)-single active fetus, meas c/w dates, fluid wnl, anterior Gr 0 placenta, cx appears closed (3.4cm), bilateral adnexa appears WNL, no major abnl noted, FHR-146 bpm, female fetus

## 2014-05-30 LAB — URINE CULTURE
Colony Count: NO GROWTH
Organism ID, Bacteria: NO GROWTH

## 2014-05-31 ENCOUNTER — Encounter: Payer: Self-pay | Admitting: Women's Health

## 2014-06-13 ENCOUNTER — Encounter (HOSPITAL_COMMUNITY): Payer: Self-pay | Admitting: Psychiatry

## 2014-06-13 ENCOUNTER — Ambulatory Visit (INDEPENDENT_AMBULATORY_CARE_PROVIDER_SITE_OTHER): Payer: No Typology Code available for payment source | Admitting: Psychiatry

## 2014-06-13 VITALS — BP 110/62 | Ht 63.0 in | Wt 158.0 lb

## 2014-06-13 DIAGNOSIS — F331 Major depressive disorder, recurrent, moderate: Secondary | ICD-10-CM

## 2014-06-13 MED ORDER — CITALOPRAM HYDROBROMIDE 20 MG PO TABS: 20.0000 mg | ORAL_TABLET | Freq: Every day | ORAL | Status: DC
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
## 2014-06-25 ENCOUNTER — Encounter: Payer: Self-pay | Admitting: Advanced Practice Midwife

## 2014-06-25 ENCOUNTER — Ambulatory Visit (INDEPENDENT_AMBULATORY_CARE_PROVIDER_SITE_OTHER): Payer: PRIVATE HEALTH INSURANCE | Admitting: Advanced Practice Midwife

## 2014-06-25 VITALS — BP 94/58 | Wt 160.5 lb

## 2014-06-25 DIAGNOSIS — Z331 Pregnant state, incidental: Secondary | ICD-10-CM

## 2014-06-25 DIAGNOSIS — Z1389 Encounter for screening for other disorder: Secondary | ICD-10-CM

## 2014-06-25 DIAGNOSIS — Z3480 Encounter for supervision of other normal pregnancy, unspecified trimester: Secondary | ICD-10-CM

## 2014-06-25 LAB — POCT URINALYSIS DIPSTICK
Blood, UA: NEGATIVE
Glucose, UA: NEGATIVE
Ketones, UA: NEGATIVE
Leukocytes, UA: NEGATIVE
Nitrite, UA: NEGATIVE
Protein, UA: NEGATIVE

## 2014-06-25 NOTE — Progress Notes (Signed)
G2P1001 10257w2d Estimated Date of Delivery: 10/13/14  Blood pressure 94/58, weight 160 lb 8 oz (72.802 kg), last menstrual period 01/06/2014.   BP weight and urine results all reviewed and noted.  Please refer to the obstetrical flow sheet for the fundal height and fetal heart rate documentation: Seeing counselor for depression. Feels well  Patient reports good fetal movement, denies any bleeding and no rupture of membranes symptoms or regular contractions. Patient is without complaints. All questions were answered.  Plan:  Continued routine obstetrical care,   Follow up in 4 weeks for OB appointment, PN2

## 2014-06-25 NOTE — Patient Instructions (Signed)
1. Before your test, do not eat or drink anything for 8-10 hours prior to your  appointment (a small amount of water is allowed and you may take any medicines you normally take). Be sure to drink lots of water the day before. 2. When you arrive, your blood will be drawn for a 'fasting' blood sugar level.  Then you will be given a sweetened carbonated beverage to drink. You should  complete drinking this beverage within five minutes. After finishing the  beverage, you will have your blood drawn exactly 1 and 2 hours later. Having  your blood drawn on time is an important part of this test. A total of three blood  samples will be done. 3. The test takes approximately 2  hours. During the test, do not have anything to  eat or drink. Do not smoke, chew gum (not even sugarless gum) or use breath mints.  4. During the test you should remain close by and seated as much as possible and  avoid walking around. You may want to bring a book or something else to  occupy your time.  5. After your test, you may eat and drink as normal. You may want to bring a snack  to eat after the test is finished. Your provider will advise you as to the results of  this test and any follow-up if necessary  You will also be retested for syphilis, HIV and blood levels (anemia):  You were already tested in the first trimester, but [facility] recommends retesting.  Additionally, you will be tested for Type 2 Herpes. MOST people do not know that they have genital herpes, as only around 15% of people have outbreaks.  However, it is still transmittable to other people, including the baby (but only during the birth).  If you test positive for Type 2 Herpes, we place you on a medicine called acyclovir the last 6 weeks of your pregnancy to prevent transmission of the virus to the baby during the birth.    If your sugar test is positive for gestational diabetes, you will be given an phone call and further instructions discussed.   We typically do not call patients with positive herpes results, but will discuss it at your next appointment.  If you wish to know all of your test results before your next appointment, feel free to call the office, or look up your test results on Mychart.  (The range that the lab uses for normal values of the sugar test are not necessarily the range that is used for pregnant women; if your results are within the range, they are definitely normal.  However, if a value is deemed "high" by the lab, it may not be too high for a pregnant woman.  We will need to discuss the normal range if your value(s) fall in the "high" category).     Sometime between 27 and 36 weeks, it is recommended that you and anyone who is going to be in close contact with your baby receive the Tdap booster.  You should receive it EACH pregnancy, regardless of when your last booster was.  You may go to the Health Department (no appointment necessary) or your Primary Care office to receive the vaccine.  If you do not receive the vaccine prior to delivery, it will be offered in the hospital.  However, if you get it at least 2 weeks prior to delivery, you will have the added advantage of passing the immunity to your baby.   

## 2014-06-29 ENCOUNTER — Other Ambulatory Visit (HOSPITAL_COMMUNITY): Payer: Self-pay | Admitting: Psychiatry

## 2014-06-30 ENCOUNTER — Ambulatory Visit (INDEPENDENT_AMBULATORY_CARE_PROVIDER_SITE_OTHER): Payer: No Typology Code available for payment source | Admitting: Psychiatry

## 2014-06-30 DIAGNOSIS — F331 Major depressive disorder, recurrent, moderate: Secondary | ICD-10-CM
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
## 2014-06-30 NOTE — Patient Instructions (Signed)
Discussed orally 

## 2014-07-07 ENCOUNTER — Telehealth: Payer: Self-pay | Admitting: Women's Health

## 2014-07-07 NOTE — Telephone Encounter (Signed)
Pt states keeps getting sinus infections and wanted to know if there was anything to take to help prevent them, she states she is taking Claritin daily but does not seem to help with sinus symptoms.  I suggested she try switching to Zyrtec and see if that helps or can use sudafed or saline nasal spray for congestion and push fluids.  Pt verbalized understanding.

## 2014-07-23 ENCOUNTER — Encounter: Payer: Self-pay | Admitting: Advanced Practice Midwife

## 2014-07-23 ENCOUNTER — Other Ambulatory Visit: Payer: PRIVATE HEALTH INSURANCE

## 2014-07-23 ENCOUNTER — Ambulatory Visit (INDEPENDENT_AMBULATORY_CARE_PROVIDER_SITE_OTHER): Payer: PRIVATE HEALTH INSURANCE | Admitting: Advanced Practice Midwife

## 2014-07-23 VITALS — BP 110/70 | Wt 168.0 lb

## 2014-07-23 DIAGNOSIS — Z331 Pregnant state, incidental: Secondary | ICD-10-CM

## 2014-07-23 DIAGNOSIS — O99810 Abnormal glucose complicating pregnancy: Secondary | ICD-10-CM

## 2014-07-23 DIAGNOSIS — Z1159 Encounter for screening for other viral diseases: Secondary | ICD-10-CM

## 2014-07-23 DIAGNOSIS — Z1389 Encounter for screening for other disorder: Secondary | ICD-10-CM

## 2014-07-23 DIAGNOSIS — Z131 Encounter for screening for diabetes mellitus: Secondary | ICD-10-CM

## 2014-07-23 DIAGNOSIS — Z3402 Encounter for supervision of normal first pregnancy, second trimester: Secondary | ICD-10-CM

## 2014-07-23 DIAGNOSIS — Z0184 Encounter for antibody response examination: Secondary | ICD-10-CM

## 2014-07-23 DIAGNOSIS — Z3480 Encounter for supervision of other normal pregnancy, unspecified trimester: Secondary | ICD-10-CM

## 2014-07-23 DIAGNOSIS — Z3483 Encounter for supervision of other normal pregnancy, third trimester: Secondary | ICD-10-CM

## 2014-07-23 LAB — POCT URINALYSIS DIPSTICK
Blood, UA: NEGATIVE
Glucose, UA: NEGATIVE
Ketones, UA: NEGATIVE
Leukocytes, UA: NEGATIVE
Nitrite, UA: NEGATIVE
Protein, UA: NEGATIVE

## 2014-07-23 LAB — CBC
HCT: 32.4 % — ABNORMAL LOW (ref 36.0–46.0)
Hemoglobin: 11.4 g/dL — ABNORMAL LOW (ref 12.0–15.0)
MCH: 31.7 pg (ref 26.0–34.0)
MCHC: 35.2 g/dL (ref 30.0–36.0)
MCV: 90 fL (ref 78.0–100.0)
Platelets: 196 10*3/uL (ref 150–400)
RBC: 3.6 MIL/uL — ABNORMAL LOW (ref 3.87–5.11)
RDW: 13.4 % (ref 11.5–15.5)
WBC: 14.9 10*3/uL — ABNORMAL HIGH (ref 4.0–10.5)

## 2014-07-23 NOTE — Progress Notes (Signed)
G2P1001 2723w2d Estimated Date of Delivery: 10/13/14  Blood pressure 110/70, weight 168 lb (76.204 kg), last menstrual period 01/06/2014.   BP weight and urine results all reviewed and noted.  Please refer to the obstetrical flow sheet for the fundal height and fetal heart rate documentation:  Patient reports good fetal movement, denies any bleeding and no rupture of membranes symptoms or regular contractions. Patient is without complaints except chronic URI sx.  Nasal drainage thick and green/yellow. Has been seeing an ENT for chronic infections. Feels like it's back.  Will see ENT.  All questions were answered.  Plan:  Continued routine obstetrical care, PN2 today  Follow up in 4 weeks for OB appointment,

## 2014-07-24 DIAGNOSIS — Z8632 Personal history of gestational diabetes: Secondary | ICD-10-CM | POA: Insufficient documentation

## 2014-07-24 LAB — HSV 2 ANTIBODY, IGG: HSV 2 Glycoprotein G Ab, IgG: <0.1 IV

## 2014-07-24 LAB — GLUCOSE TOLERANCE, 2 HOURS W/ 1HR
Glucose, 1 hour: 205 mg/dL — ABNORMAL HIGH (ref 70–170)
Glucose, 2 hour: 116 mg/dL (ref 70–139)
Glucose, Fasting: 72 mg/dL (ref 70–99)

## 2014-07-24 LAB — HIV ANTIBODY (ROUTINE TESTING W REFLEX): HIV 1&2 Ab, 4th Generation: NONREACTIVE

## 2014-07-24 LAB — RPR

## 2014-07-24 LAB — ANTIBODY SCREEN: Antibody Screen: NEGATIVE

## 2014-07-24 NOTE — Progress Notes (Signed)
Failed 2hr gtt.  Pt made aware and dietician referral made

## 2014-07-24 NOTE — Addendum Note (Signed)
Addended by: Jacklyn ShellRESENZO-DISHMON, [place]oe Hazan on: 07/24/2014 01:41 PM   Modules accepted: Orders

## 2014-07-28 ENCOUNTER — Telehealth: Payer: Self-pay | Admitting: Obstetrics and Gynecology

## 2014-07-28 ENCOUNTER — Ambulatory Visit (HOSPITAL_COMMUNITY): Payer: Self-pay | Admitting: Psychiatry

## 2014-07-28 NOTE — Telephone Encounter (Signed)
Pt states over past two days having a thick mucus d/c that is like when she lost her mucus plug with son, no pain, no bleeding, no contractions and no loss of fluids.  Informed pt to monitor dc, if gets odor with it or has color to it or develops any other symptoms notify us or if has sudden gush of fluids report to [facility], pt verbalized understanding.

## 2014-07-30 ENCOUNTER — Encounter: Payer: Self-pay | Admitting: Advanced Practice Midwife

## 2014-07-30 ENCOUNTER — Encounter: Payer: No Typology Code available for payment source | Attending: Advanced Practice Midwife

## 2014-07-30 VITALS — Ht 63.0 in | Wt 168.8 lb

## 2014-07-30 DIAGNOSIS — O99810 Abnormal glucose complicating pregnancy: Secondary | ICD-10-CM | POA: Insufficient documentation

## 2014-07-30 DIAGNOSIS — Z713 Dietary counseling and surveillance: Secondary | ICD-10-CM | POA: Insufficient documentation

## 2014-07-30 DIAGNOSIS — A3790 Whooping cough, unspecified species without pneumonia: Secondary | ICD-10-CM | POA: Insufficient documentation

## 2014-07-31 ENCOUNTER — Telehealth: Payer: Self-pay | Admitting: Obstetrics & Gynecology

## 2014-07-31 NOTE — Telephone Encounter (Signed)
Lancet and stripes called to [facility] [place], [place].

## 2014-07-31 NOTE — Progress Notes (Signed)
  Patient was seen on 07/30/14 for Gestational Diabetes self-management . The following learning objectives were met by the patient :   States the definition of Gestational Diabetes  States why dietary management is important in controlling blood glucose  Describes the effects of carbohydrates on blood glucose levels  Demonstrates ability to create a balanced meal plan  Demonstrates carbohydrate counting   States when to check blood glucose levels  Demonstrates proper blood glucose monitoring techniques  States the effect of stress and exercise on blood glucose levels  States the importance of limiting caffeine and abstaining from alcohol and smoking  Plan:  Aim for 2 Carb Choices per meal (30 grams) +/- 1 either way for breakfast Aim for 3 Carb Choices per meal (45 grams) +/- 1 either way from lunch and dinner Aim for 1-2 Carbs per snack Begin reading food labels for Total Carbohydrate and sugar grams of foods Consider  increasing your activity level by walking daily as tolerated Begin checking BG before breakfast and 1-2 hours after first bit of breakfast, lunch and dinner after  as directed by MD  Take medication  as directed by MD  Blood glucose monitor given: One touch Ultra 2 Lot # [id number] X Exp: 04/2015 Blood glucose reading: 52m/dl  Patient instructed to monitor glucose levels: FBS: 60 - <90 1 hour: <140 2 hour: <120  Patient received the following handouts:  Nutrition Diabetes and Pregnancy  Carbohydrate Counting List  Meal Planning worksheet  Patient will be seen for follow-up as needed.

## 2014-08-01 ENCOUNTER — Telehealth: Payer: Self-pay | Admitting: *Deleted

## 2014-08-01 ENCOUNTER — Ambulatory Visit (INDEPENDENT_AMBULATORY_CARE_PROVIDER_SITE_OTHER): Payer: No Typology Code available for payment source | Admitting: Psychiatry

## 2014-08-01 DIAGNOSIS — F331 Major depressive disorder, recurrent, moderate: Secondary | ICD-10-CM

## 2014-08-01 NOTE — Patient Instructions (Signed)
Discussed orally 

## 2014-08-01 NOTE — Telephone Encounter (Signed)
Accu-check Nano SmartView care kit, 100 ct test strips, lancets called to [facility] [place], [place] per Derrek Monaco, NP.
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
## 2014-08-05 ENCOUNTER — Encounter: Payer: Self-pay | Admitting: Women's Health

## 2014-08-05 ENCOUNTER — Telehealth: Payer: Self-pay | Admitting: Obstetrics & Gynecology

## 2014-08-05 ENCOUNTER — Inpatient Hospital Stay (HOSPITAL_COMMUNITY)
Admission: AD | Admit: 2014-08-05 | Discharge: 2014-08-07 | DRG: 775 | Disposition: A | Discharge status: Home / Self Care | Payer: Medicaid Other | Source: Ambulatory Visit | Attending: Family Medicine | Admitting: Family Medicine

## 2014-08-05 ENCOUNTER — Other Ambulatory Visit: Payer: Self-pay | Admitting: *Deleted

## 2014-08-05 ENCOUNTER — Encounter (HOSPITAL_COMMUNITY): Payer: Self-pay | Admitting: *Deleted

## 2014-08-05 ENCOUNTER — Ambulatory Visit (INDEPENDENT_AMBULATORY_CARE_PROVIDER_SITE_OTHER): Payer: PRIVATE HEALTH INSURANCE | Admitting: Women's Health

## 2014-08-05 VITALS — BP 122/58 | Wt 168.0 lb

## 2014-08-05 DIAGNOSIS — O42019 Preterm premature rupture of membranes, onset of labor within 24 hours of rupture, unspecified trimester: Secondary | ICD-10-CM

## 2014-08-05 DIAGNOSIS — O4290 Premature rupture of membranes, unspecified as to length of time between rupture and onset of labor, unspecified weeks of gestation: Principal | ICD-10-CM | POA: Diagnosis present

## 2014-08-05 DIAGNOSIS — O99810 Abnormal glucose complicating pregnancy: Secondary | ICD-10-CM

## 2014-08-05 DIAGNOSIS — Z1389 Encounter for screening for other disorder: Secondary | ICD-10-CM

## 2014-08-05 DIAGNOSIS — O42013 Preterm premature rupture of membranes, onset of labor within 24 hours of rupture, third trimester: Secondary | ICD-10-CM

## 2014-08-05 DIAGNOSIS — O42919 Preterm premature rupture of membranes, unspecified as to length of time between rupture and onset of labor, unspecified trimester: Secondary | ICD-10-CM | POA: Diagnosis present

## 2014-08-05 DIAGNOSIS — O09891 Supervision of other high risk pregnancies, first trimester: Secondary | ICD-10-CM

## 2014-08-05 DIAGNOSIS — Z141 Cystic fibrosis carrier: Secondary | ICD-10-CM

## 2014-08-05 DIAGNOSIS — A3790 Whooping cough, unspecified species without pneumonia: Secondary | ICD-10-CM

## 2014-08-05 DIAGNOSIS — Z331 Pregnant state, incidental: Secondary | ICD-10-CM

## 2014-08-05 DIAGNOSIS — O2342 Unspecified infection of urinary tract in pregnancy, second trimester: Secondary | ICD-10-CM

## 2014-08-05 DIAGNOSIS — Z8041 Family history of malignant neoplasm of ovary: Secondary | ICD-10-CM

## 2014-08-05 DIAGNOSIS — O42913 Preterm premature rupture of membranes, unspecified as to length of time between rupture and onset of labor, third trimester: Secondary | ICD-10-CM | POA: Insufficient documentation

## 2014-08-05 DIAGNOSIS — O99340 Other mental disorders complicating pregnancy, unspecified trimester: Secondary | ICD-10-CM

## 2014-08-05 DIAGNOSIS — Z3483 Encounter for supervision of other normal pregnancy, third trimester: Secondary | ICD-10-CM

## 2014-08-05 LAB — POCT URINALYSIS DIPSTICK
Blood, UA: NEGATIVE
Glucose, UA: NEGATIVE
Leukocytes, UA: NEGATIVE
Nitrite, UA: NEGATIVE
Protein, UA: NEGATIVE

## 2014-08-05 LAB — GLUCOSE, CAPILLARY: Glucose-Capillary: 170 mg/dL — ABNORMAL HIGH (ref 70–99)

## 2014-08-05 MED ORDER — ZOLPIDEM TARTRATE 5 MG PO TABS: 5.0000 mg | ORAL_TABLET | Freq: Every evening | ORAL | Status: DC | PRN

## 2014-08-05 MED ORDER — LANOLIN HYDROUS EX OINT: TOPICAL_OINTMENT | CUTANEOUS | Status: DC | PRN

## 2014-08-05 MED ORDER — LACTATED RINGERS IV SOLN
INTRAVENOUS | Status: DC
  Administered 2014-08-05: 16:00:00 via INTRAVENOUS

## 2014-08-05 MED ORDER — SODIUM CHLORIDE 0.9 % IV SOLN
2.0000 g | Freq: Once | INTRAVENOUS | Status: AC
  Administered 2014-08-05: 2 g via INTRAVENOUS
  Filled 2014-08-05: qty 2000

## 2014-08-05 MED ORDER — EPHEDRINE 5 MG/ML INJ
10.0000 mg | INTRAVENOUS | Status: DC | PRN
  Filled 2014-08-05: qty 2

## 2014-08-05 MED ORDER — ONDANSETRON HCL 4 MG/2ML IJ SOLN: 4.0000 mg | Freq: Four times a day (QID) | INTRAMUSCULAR | Status: DC | PRN

## 2014-08-05 MED ORDER — CITRIC ACID-SODIUM CITRATE 334-500 MG/5ML PO SOLN: 30.0000 mL | ORAL | Status: DC | PRN

## 2014-08-05 MED ORDER — LIDOCAINE HCL (PF) 1 % IJ SOLN
30.0000 mL | INTRAMUSCULAR | Status: DC | PRN
  Filled 2014-08-05: qty 30

## 2014-08-05 MED ORDER — PHENYLEPHRINE 40 MCG/ML (10ML) SYRINGE FOR IV PUSH (FOR BLOOD PRESSURE SUPPORT)
80.0000 ug | PREFILLED_SYRINGE | INTRAVENOUS | Status: DC | PRN
  Filled 2014-08-05: qty 2

## 2014-08-05 MED ORDER — PHENYLEPHRINE 40 MCG/ML (10ML) SYRINGE FOR IV PUSH (FOR BLOOD PRESSURE SUPPORT)
PREFILLED_SYRINGE | INTRAVENOUS | Status: AC
  Filled 2014-08-05: qty 5

## 2014-08-05 MED ORDER — OXYCODONE-ACETAMINOPHEN 5-325 MG PO TABS: 1.0000 | ORAL_TABLET | ORAL | Status: DC | PRN

## 2014-08-05 MED ORDER — CITALOPRAM HYDROBROMIDE 20 MG PO TABS
20.0000 mg | ORAL_TABLET | Freq: Every day | ORAL | Status: DC
  Administered 2014-08-05 – 2014-08-07 (×3): 20 mg via ORAL
  Filled 2014-08-05 (×3): qty 1

## 2014-08-05 MED ORDER — ONDANSETRON HCL 4 MG PO TABS: 4.0000 mg | ORAL_TABLET | ORAL | Status: DC | PRN

## 2014-08-05 MED ORDER — FENTANYL 2.5 MCG/ML BUPIVACAINE 1/10 % EPIDURAL INFUSION (WH - ANES): 14.0000 mL/h | INTRAMUSCULAR | Status: DC | PRN

## 2014-08-05 MED ORDER — DEXTROSE 5 % IV SOLN
500.0000 mg | INTRAVENOUS | Status: DC
  Filled 2014-08-05: qty 500

## 2014-08-05 MED ORDER — ACETAMINOPHEN 325 MG PO TABS: 650.0000 mg | ORAL_TABLET | ORAL | Status: DC | PRN

## 2014-08-05 MED ORDER — DIBUCAINE 1 % RE OINT: 1.0000 "application " | TOPICAL_OINTMENT | RECTAL | Status: DC | PRN

## 2014-08-05 MED ORDER — BENZOCAINE-MENTHOL 20-0.5 % EX AERO: 1.0000 "application " | INHALATION_SPRAY | CUTANEOUS | Status: DC | PRN

## 2014-08-05 MED ORDER — MAGNESIUM SULFATE 40 G IN LACTATED RINGERS - SIMPLE
2.0000 g/h | INTRAVENOUS | Status: DC
Start: 2014-08-05 — End: 2014-08-05
  Administered 2014-08-05: 2 g/h via INTRAVENOUS
  Filled 2014-08-05: qty 500

## 2014-08-05 MED ORDER — ONDANSETRON HCL 4 MG/2ML IJ SOLN: 4.0000 mg | INTRAMUSCULAR | Status: DC | PRN

## 2014-08-05 MED ORDER — OXYTOCIN 40 UNITS IN LACTATED RINGERS INFUSION - SIMPLE MED
62.5000 mL/h | INTRAVENOUS | Status: DC
  Administered 2014-08-05: 62.5 mL/h via INTRAVENOUS
  Filled 2014-08-05: qty 1000

## 2014-08-05 MED ORDER — FENTANYL 2.5 MCG/ML BUPIVACAINE 1/10 % EPIDURAL INFUSION (WH - ANES)
INTRAMUSCULAR | Status: AC
  Filled 2014-08-05: qty 125

## 2014-08-05 MED ORDER — PRENATAL MULTIVITAMIN CH
1.0000 | ORAL_TABLET | Freq: Every day | ORAL | Status: DC
  Administered 2014-08-06 – 2014-08-07 (×2): 1 via ORAL
  Filled 2014-08-05 (×2): qty 1

## 2014-08-05 MED ORDER — BUDESONIDE-FORMOTEROL FUMARATE 160-4.5 MCG/ACT IN AERO
2.0000 | INHALATION_SPRAY | Freq: Two times a day (BID) | RESPIRATORY_TRACT | Status: DC
  Administered 2014-08-05 – 2014-08-07 (×4): 2 via RESPIRATORY_TRACT
  Filled 2014-08-05: qty 6

## 2014-08-05 MED ORDER — SIMETHICONE 80 MG PO CHEW: 80.0000 mg | CHEWABLE_TABLET | ORAL | Status: DC | PRN

## 2014-08-05 MED ORDER — DIPHENHYDRAMINE HCL 50 MG/ML IJ SOLN: 12.5000 mg | INTRAMUSCULAR | Status: DC | PRN

## 2014-08-05 MED ORDER — LORATADINE 10 MG PO TABS
10.0000 mg | ORAL_TABLET | Freq: Every day | ORAL | Status: DC
  Administered 2014-08-05 – 2014-08-07 (×3): 10 mg via ORAL
  Filled 2014-08-05 (×3): qty 1

## 2014-08-05 MED ORDER — TETANUS-DIPHTH-ACELL PERTUSSIS 5-2.5-18.5 LF-MCG/0.5 IM SUSP
0.5000 mL | Freq: Once | INTRAMUSCULAR | Status: AC
  Administered 2014-08-06: 0.5 mL via INTRAMUSCULAR

## 2014-08-05 MED ORDER — IBUPROFEN 600 MG PO TABS
600.0000 mg | ORAL_TABLET | Freq: Four times a day (QID) | ORAL | Status: DC
  Administered 2014-08-05 – 2014-08-07 (×8): 600 mg via ORAL
  Filled 2014-08-05 (×8): qty 1

## 2014-08-05 MED ORDER — LACTATED RINGERS IV SOLN: 500.0000 mL | INTRAVENOUS | Status: DC | PRN

## 2014-08-05 MED ORDER — AZITHROMYCIN 500 MG PO TABS: 500.0000 mg | ORAL_TABLET | Freq: Every day | ORAL | Status: DC

## 2014-08-05 MED ORDER — DIPHENHYDRAMINE HCL 25 MG PO CAPS: 25.0000 mg | ORAL_CAPSULE | Freq: Four times a day (QID) | ORAL | Status: DC | PRN

## 2014-08-05 MED ORDER — SENNOSIDES-DOCUSATE SODIUM 8.6-50 MG PO TABS
2.0000 | ORAL_TABLET | ORAL | Status: DC
  Administered 2014-08-05 – 2014-08-06 (×2): 2 via ORAL
  Filled 2014-08-05 (×2): qty 2

## 2014-08-05 MED ORDER — WITCH HAZEL-GLYCERIN EX PADS: 1.0000 "application " | MEDICATED_PAD | CUTANEOUS | Status: DC | PRN

## 2014-08-05 MED ORDER — OXYTOCIN BOLUS FROM INFUSION
500.0000 mL | INTRAVENOUS | Status: DC
  Administered 2014-08-05: 500 mL via INTRAVENOUS

## 2014-08-05 MED ORDER — LACTATED RINGERS IV SOLN: 500.0000 mL | Freq: Once | INTRAVENOUS | Status: DC

## 2014-08-05 MED ORDER — BETAMETHASONE SOD PHOS & ACET 6 (3-3) MG/ML IJ SUSP
12.0000 mg | Freq: Once | INTRAMUSCULAR | Status: AC
  Administered 2014-08-05: 12 mg via INTRAMUSCULAR

## 2014-08-05 NOTE — Progress Notes (Signed)
Pt presents with complaints of active contractions x 3 hrs, and decreased FM Exam by [name] booker to evaluate cervix accompanied by SROM, clear fluid Noted. Pt will be transported via Ems To [facility] L&D for evaluation, tocolysis with Mag Sulfate, initiation of latency antibiotics.  She has not had GBS /GC/CHL collected here. Labor & Delivery to be notified, Dr Adrian Blackwater informed of transport plans. jvf

## 2014-08-05 NOTE — Patient Instructions (Signed)
Transport via EMS to [facility].

## 2014-08-05 NOTE — H&P (Signed)
LABOR ADMISSION HISTORY AND PHYSICAL  Sandra Glover is a 28 y.o. female G2P1001 with IUP at [redacted]w[redacted]d by LMP cw 6w sono presenting after patient PROM'd in clinic during cervical exam performed 2/2 complaints of contractions. Contractions started at 1200.  En route to hospital from clinic patient was loaded with magnesium, received betamethasone in clinic.  No blurry vision, headaches or peripheral edema, and RUQ pain. She desires an epidural for labor pain control. She plans on breast and bottle feeding.  Dating: By LMP cw 6w sono --->  Estimated Date of Delivery: 10/13/14   Prenatal History/Complications:  Past Medical History: Past Medical History  Diagnosis Date  . History of anemia 2013  . Sinus infection 01/25/2013    started antibiotic 01/24/2013 x 10 days; nasal congestion, clear drainage from nose  . History of depression     states stopped med. in 2013  . Tonsillar and adenoid hypertrophy 01/2013  . 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 Vaginal irritation 03/25/2014    Mild yeast but has history of yeast, will not treat til after first trimester,try yogurt  . Gestational diabetes mellitus, antepartum   . Pertussis     Past Surgical History: Past Surgical History  Procedure Laterality Date  . Cholecystectomy  04/23/2010    laparoscopic  . Partial nephrectomy Right age 58 mos.  . Tonsillectomy and adenoidectomy N/A 01/29/2013    Procedure: TONSILLECTOMY AND ADENOIDECTOMY;  Surgeon: Darletta Moll, MD;  Location: [facility];  Service: ENT;  Laterality: N/A;    Obstetrical History: OB History   Grav Para Term Preterm Abortions TAB SAB Ect Mult Living   Social History: History   Social History  . Marital Status: Married    Spouse Name: N/A    Number of Children: N/A  . Years of Education: N/A   Social History Main Topics  . Smoking status: Never Smoker   . Smokeless tobacco: Never Used  .  Alcohol Use: No     Comment: socially; not now  . Drug Use: No  . Sexual Activity: Yes    Birth Control/ Protection: None     Comment: nexplanon implant   Other Topics Concern  . None   Social History Narrative  . None    Family History: Family History  Problem Relation Age of Onset  . COPD Mother   . Depression Mother   . Anxiety disorder Mother   . Ovarian cancer Maternal Aunt   . Ovarian cancer Maternal Grandmother   . Bipolar disorder Paternal Uncle   . Anxiety disorder Cousin   . Drug abuse Cousin   . Bipolar disorder Cousin     Allergies: Allergies  Allergen Reactions  . Lortab [Hydrocodone-Acetaminophen] Nausea And Vomiting  . Morphine And Related Rash    Prescriptions prior to admission  Medication Sig Dispense Refill  . Budesonide-Formoterol Fumarate (SYMBICORT IN) Inhale into the lungs.      . cetirizine (ZYRTEC) 5 MG tablet Take 10 mg by mouth daily.      . citalopram (CELEXA) 20 MG tablet Take 1 tablet (20 mg total) by mouth daily.  30 tablet  2  . Prenat-FeFum-FePo-FA-Omega 3 (CONCEPT DHA) 53.5-38-1 MG CAPS Take 1 capsule by mouth daily.  30 capsule  11     Review of Systems   All systems reviewed and negative  except as stated in HPI  Last menstrual period 01/06/2014. General appearance: alert and moderate distress Lungs: clear to auscultation bilaterally Heart: regular rate and rhythm Abdomen: soft, non-tender; bowel sounds normal Extremities: Homans sign is negative, no sign of DVT DTR's not examined Presentation: cephalic Fetal monitoringBaseline: 170 bpm, Variability: Fair (1-6 bpm), Accelerations: Non-reactive but appropriate for gestational age and Decelerations: Variable: severe Uterine activity q37min     Prenatal labs: ABO, Rh: O/POS/-- (03/30 1645) Antibody: NEG (08/19 0908) Rubella:   RPR: NON REAC (08/19 0908)  HBsAg: NEGATIVE (03/30 1645)  HIV: NONREACTIVE (08/19 0908)  GBS:    1 hr Glucola 205 Genetic screening  Declined,  is CF carrier Anatomy US normal   Results for orders placed in visit on 08/05/14 (from the past 24 hour(s))  POCT URINALYSIS DIPSTICK   Collection Time    08/05/14  2:26 PM      Result Value Ref Range   Color, UA       Clarity, UA       Glucose, UA neg     Bilirubin, UA       Ketones, UA large     Spec Grav, UA       Blood, UA neg     pH, UA       Protein, UA neg     Urobilinogen, UA       Nitrite, UA neg     Leukocytes, UA Negative      Patient Active Problem List   Diagnosis Date Noted  . Preterm premature rupture of membranes in third trimester 08/05/2014  . Active preterm labor 08/05/2014  . Preterm premature rupture of membranes 08/05/2014  . Pertussis 07/30/2014  . Impaired glucose in pregnancy, antepartum 07/24/2014  . UTI (urinary tract infection) in pregnancy in second trimester 05/07/2014  . Vaginal irritation 03/25/2014  . 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 Supervision of other normal pregnancy 03/03/2014  . Depression with anxiety 03/03/2014    Assessment: Sandra Glover is a 28 y.o. G2P1001 at [redacted]w[redacted]d here for Premature preterm rupture of membranes   #Labor: no augmentation #Pain: Hold pain medications as FHT minimal variability with deep variables #FWB: Cat II, continue magnesium, betamethasone 2nd dose tomorrow #ID:  Amp, azithromycin for latency antibiotics #MOF: breast/bottle #MOC: will address with patient  Sandra Glover 08/05/2014, 4:25 PM

## 2014-08-05 NOTE — Telephone Encounter (Signed)
Pt states having "Braxton Hicks" contractions and a lot of pressure. Pt states she is in [place], [place] and it would be 1 hour and 15 minutes or more to be seen at [facility]. Pt informed to come in now to be seen at our office. Pt verbalized understanding.

## 2014-08-05 NOTE — Lactation Note (Signed)
This note was copied from the chart of Sandra Glover. Lactation Consultation Note    Initial consult with this mom of a NICU baby, now 2 hours old, and 30 1/[redacted] weeks gestation. This is mom's second baby, and she did both breast and formula with her first, because she was not able to pump at work. She is hoping it will be better this time. I showed mom how to hand express, and she has lots of colostrum, and expressed 4 mls with pumping in premie setting, and hand expression. I showed mom and dad how and why to set the premie setting, reviewed the NICU booklet, lactation services,  Mom and dad very receptive to teaching, and eager to go visit their baby in the nICU. Mom knows to call for alctaion help as needed.  Patient Name: Sandra Radha Coggins [id number] Date: 08/05/2014 Reason for consult: Initial assessment;NICU baby   Maternal Data Formula Feeding for Exclusion: Yes (baby in NICU) Has patient been taught Hand Expression?: Yes Does the patient have breastfeeding experience prior to this delivery?: Yes  Feeding    LATCH Score/Interventions                      Lactation Tools Discussed/Used Tools: Pump Breast pump type: Double-Electric Breast Pump WIC Program[place] infr[place] on mom and baby for DEP) Pump Review: Setup, frequency, and cleaning;Milk Storage;Other (comment) (premie setting, hand expression, review of NICU booklet on providing EBm for a NICU baby) Initiated by:: clee rn at 2 hours post partum Date initiated:: 08/05/14   Consult Status Consult Status: Follow-up Date: 08/06/14 Follow-up type: In-patient    Alfred Levins 08/05/2014, 6:37 PM

## 2014-08-05 NOTE — Progress Notes (Signed)
Pt presented as work-in for uc's that began 3hrs ago while she was working in [place]- started off slow, then progressively worsened. +pressure. Decreased fm. Mucousy d/c x last few wks. Denies uti s/s or malodorous d/c, itching/irritation. Pt breathing w/ uc's, very uncomfortable, water broke right before I walked into room- large amount clear fluid. SVE 3/80/-2, vtx, bloody show. UCs q . FHR 150, mod variability, occ mild variable, no accels. BMZ  given IM x 1 @ 1445. No tocolytics her in office. Last labor 4 yrs ago was 5hrs at term, had ptl @ 34wks w/ that pregnancy. EMS called to transport pt emergency traffic to [facility]. Dr. Emelda Fear in office- notified- EMS arrived w/in - they have magnesium on board- per Dr. Emelda Fear will give loading dose while en route. Dr. Adrian Blackwater @ [facility] notified by Dr. Emelda Fear to be expecting pt.  Pt has had chronic URIs followed by ENT, states she was dx w/ pertussis about 2wks ago.

## 2014-08-05 NOTE — H&P (Signed)
Attestation of Attending Supervision of Obstetric Fellow: Evaluation and management procedures were performed by the Obstetric Fellow under my supervision and collaboration.  I have reviewed the Obstetric Fellow's note and chart, and I agree with the management and plan.  Jacob Stinson, DO Attending Physician Faculty Practice, [facility]  

## 2014-08-06 LAB — CBC
HCT: 30.8 % — ABNORMAL LOW (ref 36.0–46.0)
Hemoglobin: 10.7 g/dL — ABNORMAL LOW (ref 12.0–15.0)
MCH: 31.8 pg (ref 26.0–34.0)
MCHC: 34.7 g/dL (ref 30.0–36.0)
MCV: 91.4 fL (ref 78.0–100.0)
Platelets: 207 10*3/uL (ref 150–400)
RBC: 3.37 MIL/uL — ABNORMAL LOW (ref 3.87–5.11)
RDW: 13.2 % (ref 11.5–15.5)
WBC: 41.5 10*3/uL — ABNORMAL HIGH (ref 4.0–10.5)

## 2014-08-06 NOTE — Progress Notes (Signed)
Post Partum Day 1 Subjective: no complaints, up ad lib, voiding, tolerating PO and + flatus  Objective: Blood pressure 110/75, pulse 78, temperature 97.6 F (36.4 C), temperature source Oral, resp. rate 18, height  (1.6 m), weight 76.204 kg (168 lb), last menstrual period 01/06/2014, SpO2 98.00%, unknown if currently breastfeeding.  Physical Exam:  General: alert, cooperative and no distress Lochia: appropriate Uterine Fundus: firm Incision: n/a  DVT Evaluation: No evidence of DVT seen on physical exam. Negative Homan's sign. No cords or calf tenderness. No significant calf/ankle edema.   Recent Labs  08/06/14 0537  HGB 10.7*  HCT 30.8*    Assessment/Plan: Plan for discharge tomorrow Infant in NICU Pumping breastmilk Plans Nexplanon for contraception, f/u at [facility]   LOS: 1 day   LEFTWICH-KIRBY, Terilyn Sano 08/06/2014, 7:31 AM

## 2014-08-06 NOTE — Progress Notes (Signed)
Ur chart review completed.  

## 2014-08-07 MED ORDER — IBUPROFEN 600 MG PO TABS: 600.0000 mg | ORAL_TABLET | Freq: Four times a day (QID) | ORAL | Status: DC

## 2014-08-07 NOTE — Lactation Note (Signed)
This note was copied from the chart of Girl Alexiah Koroma. Lactation Consultation Note    Follow up consult witht his mom of a NICU baby, now 39 hours old and 30 3/7 weeks CGA. Mom and dad have not gotten to hold the baby yet. I told them to ask their baby's nurse  How much longer she would have the arterial line, and see when they could hold her and so STS. Mom being discharged to home tday, and getting a DEP from [facility].  Patient Name: Girl Okla Qazi [id number] Date: 08/07/2014 Reason for consult: Follow-up assessment;NICU baby   Maternal Data    Feeding    LATCH Score/Interventions                      Lactation Tools Discussed/Used Pump Review: Setup, frequency, and cleaning (milk transitioning in- switch to standard setting, 15-30 minutes, to fet DEP today)   Consult Status Consult Status: PRN Follow-up type:  (NICU)    Alfred Levins 08/07/2014, 10:24 AM

## 2014-08-07 NOTE — Progress Notes (Signed)
Clinical Social Work Department PSYCHOSOCIAL ASSESSMENT - MATERNAL/CHILD 08/06/2014  Patient:  Sandra Glover,Sandra Glover  Account Number:  [id number]  Admit Date:  08/05/2014  Childs Name:   Olivia Grey Hawthorne    Clinical Social Worker:  Danish Ruffins, LCSW   Date/Time:  08/06/2014 01:30 PM  Date Referred:        Other referral source:   No referral/NICU admission  Uopn chart review, CSW notes that MOB has a history of depression    I:  FAMILY / HOME ENVIRONMENT Child's legal guardian:  PARENT  Guardian - Name Guardian - Age Guardian - Address  Tanayah Dena [address], [place], [place] [address]  Josh Ferriss  same   Other household support members/support persons Name Relationship DOB  Logan Swaggerty SON 02/18/10   Other support:   Parents report that both sets of grandparents live in [place], which is about 10 minutes from their home.  MOB states their son is currently staying with her parents while they are in the hospital.    II  PSYCHOSOCIAL DATA Information Source:  Family Interview  Financial and Community Resources Employment:   MOB works for [employer] in [place]  FOB works for [employer] in [place]   Financial resources:  Medicaid If Medicaid - County:  [place]  School / Grade:   Maternity Care Coordinator / Child Services Coordination / Early Interventions:   Baby will qualify for CC4C  Cultural issues impacting care:   Parents report support through their church [employer].    III  STRENGTHS Strengths  Adequate Resources  Compliance with medical plan  Other - See comment  Supportive family/friends  Understanding of illness   Strength comment:  Pediatric follow up will be at [facility] in [place]. MOB states her baby shower is scheduled for 08/30/14 and reports being able to get the necessary supplies prior to baby's discharge.   IV  RISK FACTORS AND CURRENT PROBLEMS Current Problem:  YES   Risk Factor & Current  Problem Patient Issue Family Issue Risk Factor / Current Problem Comment  Mental Illness Y N MOB-Dep/Anx         V  SOCIAL WORK ASSESSMENT  CSW met with parents in MOB's third floor room/317 to introduce myself, complete assessment due to baby's admission to NICU at 30 weeks, and offer support.  Parents were pleasant and welcoming of CSW's visit.  They report doing well at this time.  MOB was open to sharing her birth story with CSW and reports that baby came very unexpectedly.  CSW assisted parents in processing their feelings surrounding baby's delivery as well as educated them on signs and symptoms of PPD.  CSW discussed common emotions related to a NICU admission, especially on the day of MOB's discharge tomorrow.  Parents were very receptive to information and engaged in conversation.  MOB reports a hx of Anx/Dep and states she takes 20mg of Celexa daily.  She states she may consider increasing her dose during the post partum period.  She denies PPD after her son, but is aware that she is at risk given her hx and baby's premature birth/separation from baby.  MOB states [facility] [place] [facility] and has a counselor at [employer], whom she sees as needed.  CSW asked if parents have an understanding of the milestones baby must reach in order to be ready for discharge.  Parents state they do not and feel a   discussion about this would be helpful.  CSW explained that the conversation would be in general terms and not specific to their baby.  CSW explained what to expect and an estimated timeline, encouraging them to be patient with baby and not focus solely on discharge.  Parents were understanding and appeared appreciative of the information.  They report no questions, concerns or needs at this time.  They state they have good supports.  They live 45 minutes from the hospital, but state transportation will not be an issue in order to visit baby.  CSW  offered gas cards and parents will let CSW know if they would like to utilize this resource.  CSW has no social concerns at this time.   VI SOCIAL WORK PLAN Social Work Plan  Psychosocial Support/Ongoing Assessment of Needs  Patient/Family Education   Type of pt/family education:   PPD signs and symptoms/Common emotions related to a NICU admission  Ongoing support services offered by NICU CSW  What to expect from a NICU admission, in general terms.   If child protective services report - county:   If child protective services report - date:   Information/referral to community resources comment:   No referral needs noted at this time.  MOB is well linked to mental health services.   Other social work plan:    

## 2014-08-07 NOTE — Discharge Instructions (Signed)

## 2014-08-07 NOTE — Progress Notes (Signed)
Discharge teaching complete. Pt understood all instructions and did not have any questions. Pt ambulated out of the hospital and discharged home with family.

## 2014-08-09 ENCOUNTER — Encounter: Payer: Self-pay | Admitting: Advanced Practice Midwife

## 2014-08-14 ENCOUNTER — Ambulatory Visit (HOSPITAL_COMMUNITY): Payer: Self-pay | Admitting: Psychiatry

## 2014-08-18 ENCOUNTER — Ambulatory Visit (HOSPITAL_COMMUNITY): Payer: Self-pay | Admitting: Psychiatry

## 2014-08-20 ENCOUNTER — Encounter: Payer: PRIVATE HEALTH INSURANCE | Admitting: Advanced Practice Midwife

## 2014-08-27 ENCOUNTER — Ambulatory Visit (HOSPITAL_COMMUNITY): Payer: Self-pay | Admitting: Psychiatry

## 2014-09-02 ENCOUNTER — Ambulatory Visit (INDEPENDENT_AMBULATORY_CARE_PROVIDER_SITE_OTHER): Payer: Medicaid Other | Admitting: Psychiatry

## 2014-09-02 DIAGNOSIS — F331 Major depressive disorder, recurrent, moderate: Secondary | ICD-10-CM | POA: Diagnosis not present

## 2014-09-04 NOTE — Patient Instructions (Signed)
Discussed orally 

## 2014-09-04 NOTE — Progress Notes (Signed)
   THERAPIST PROGRESS NOTE  Session Time: Tuesday 09/02/2014 9:15 AM - 10:00 AM  Participation Level: Active  Behavioral Response: CasualAlertAnxious  Type of Therapy: Individual Therapy  Treatment Goals addressed: Increase self acceptance  Improve ability to manage stress and anxiety in healthy ways  Process and resolve issues and feelings of anger, and appropriate guilt, and loss related to past issues in marriage   Interventions: CBT and Supportive  Summary: Sandra HomansJessica H Glover is a 28 y.o. female who presents with a history of symptoms of depression beginning in 2009 a few days after her wedding when her aunt with whom she was very close died suddenly. She began taking an anti-depressant at that time but discontinued after about 2 1/2 years and reports doing well without the medication for about 1 1/2 years. She began to experience marital stress as husband was not very involved when they had their first child 4 years ago. Patient reports husband left her at the hospital on the night of son's birth and didn't really assist her with parenting responsibilities. He also wouldn't go anywhere with patient and son in public as husband has self-esteem issues and fear of failure per patient's report. Patient reports eventually having an affair with a coworker after husband shut down and shut out patient when she confronted him about their marriage. However, she ended the affair and recommitted to the marriage about 3 years ago. She reports learning last year during the process of buying their first home, husband had been having an affair with his co Advertising account planner-worker. Patient states she felt like she relapsed with depression at that time. She states having feelings of worthlessness, not wanting to get out of bed, no interest in make-up or doing her hair, and difficulty concentrating at work. She also reports periods of screaming, yelling, and throwing items feeling as though she is losing a grip. Patient and  husband currently are working on their marriage and have started marital counseling with their pastor's wife. Husband also is receiving individual therapy. Patient continues to have trust issues dating back to husband cheating on her when they were dating.   Since last session, patient reports having her baby 10 weeks prematurely on 08/25/2014. The baby remains in NICU but is progressing well. She has returned to work and reports stress regarding balancing work and visiting her baby. She reports crying daily for the first two weeks after delivering baby and still crying every time she leaves from visiting. She sometimes blames self for baby being born prematurely although nothing indicates she did anything to cause this. She reports strong support from her husband and her employer. Patient reports improved relationship with husband as he has been very supportive. She states baby's premature birth has brought she and husband closer and that she has forgiven husband for the past. Patient is using support services for parents of premature parents provided through the hospital. Shereports being more assertive in relationship with her mother.  Suicidal/Homicidal: No  Therapist Response: Therapist worked with patient to process feelings, identify and challenge cognitive distortioins, identify coping statements, praise use of assertiveness skills, identify realistic expectations of self during this transition, and identify relaxation techniques.  Plan: Return again in 4 weeks.  Diagnosis: Axis I: MDD, Recurrent, Moderate    Axis II: No diagnosis    Wlliam Grosso, LCSW 09/04/2014

## 2014-09-08 ENCOUNTER — Telehealth: Payer: Self-pay | Admitting: *Deleted

## 2014-09-08 NOTE — Telephone Encounter (Signed)
Angelique [facility]ise [facility], states will fax a release of information to our office.

## 2014-09-09 ENCOUNTER — Encounter: Payer: Self-pay | Admitting: Women's Health

## 2014-09-09 ENCOUNTER — Ambulatory Visit (INDEPENDENT_AMBULATORY_CARE_PROVIDER_SITE_OTHER): Payer: PRIVATE HEALTH INSURANCE | Admitting: Women's Health

## 2014-09-09 DIAGNOSIS — Z8751 Personal history of pre-term labor: Secondary | ICD-10-CM

## 2014-09-09 DIAGNOSIS — F418 Other specified anxiety disorders: Secondary | ICD-10-CM

## 2014-09-09 DIAGNOSIS — R300 Dysuria: Secondary | ICD-10-CM

## 2014-09-09 DIAGNOSIS — O8620 Urinary tract infection following delivery, unspecified: Secondary | ICD-10-CM

## 2014-09-09 LAB — POCT URINALYSIS DIPSTICK
Glucose, UA: NEGATIVE
Ketones, UA: NEGATIVE
Nitrite, UA: POSITIVE
Protein, UA: NEGATIVE

## 2014-09-09 MED ORDER — FLUCONAZOLE 150 MG PO TABS: 150.0000 mg | ORAL_TABLET | Freq: Once | ORAL | Status: DC

## 2014-09-09 MED ORDER — NITROFURANTOIN MONOHYD MACRO 100 MG PO CAPS: 100.0000 mg | ORAL_CAPSULE | Freq: Two times a day (BID) | ORAL | Status: DC

## 2014-09-09 NOTE — Progress Notes (Signed)
Patient ID: Sandra Glover, female   DOB: 08/12/1986, 28 y.o.   MRN: [id number] Subjective:    Sandra HomansJessica H Roberge is a 28 y.o. [id number] Caucasian female who presents for a postpartum visit. She is 4 weeks postpartum following a spontaneous vaginal delivery at 30.1 gestational weeks d/t spontaneous labor. Presented to FT that day was 3cm w/ srom, transferred via ems to whog and birthed 15-4830mins after arrival. Anesthesia: none. I have fully reviewed the prenatal and intrapartum course. Postpartum course has been complicated by urinary frequency and dysuria since Friday. Baby's course has been complicated by prematurity, still in NICU, doing well. Baby is feeding by pumped breastmilk, latched on twice so far. Bleeding no bleeding. Bowel function is normal. Bladder function is normal. Patient is not sexually active. Last sexual activity: prior to birth of baby. Contraception method is abstinence and planning nexplanon, has appt Monday for placement. Postpartum depression screening: positive. Score 13, h/o depression/anxiety, currently on celexa 20mg  daily, feels like she's doing well. Sees psychologist Florencia Reasonseggy Bynum here in [place] q month, last saw last Tuesday. Denies SI/HI/II.  Last pap 06/2012 and was neg. Cough from pertussis resolved 1wk after birth of baby.   The following portions of the patient's history were reviewed and updated as appropriate: allergies, current medications, past medical history, past surgical history and problem list.  Review of Systems Pertinent items are noted in HPI.   Filed Vitals:   09/09/14 0917  BP: 112/68  Height: 5\' 3"  (1.6 m)  Weight: 160 lb (72.576 kg)   No LMP recorded.  Objective:   General:  alert, cooperative and no distress   Breasts:  deferred, no complaints  Lungs: clear to auscultation bilaterally  Heart:  regular rate and rhythm  Abdomen: soft, nontender   Vulva: normal  Vagina: normal vagina  Cervix:  closed  Corpus: Well-involuted  Adnexa:   Non-palpable  Rectal Exam: No hemorrhoids        Assessment:   Postpartum exam 4 wks s/p 30.1wk spontaneous vag PTB Breastfeeding Depression screening Depression/anxiety, on celexa Contraception counseling A1DM during pregnancy UTI  Plan:  Contraception: abstinence until Nexplanon placed on Monday Follow up in: 4 weeks for pap & physical and 2hr gtt  Rx macrobid for uti, states she always gets yeast infection w/ antibiotics so requested diflucan as well  Marge DuncansBooker, Perrin Eddleman Randall CNM, [facility]NP-BC 09/09/2014 9:36 AM

## 2014-09-12 LAB — URINE CULTURE: Colony Count: >=100000

## 2014-09-15 ENCOUNTER — Ambulatory Visit (INDEPENDENT_AMBULATORY_CARE_PROVIDER_SITE_OTHER): Payer: PRIVATE HEALTH INSURANCE | Admitting: Women's Health

## 2014-09-15 ENCOUNTER — Encounter: Payer: Self-pay | Admitting: Women's Health

## 2014-09-15 VITALS — BP 118/70 | Ht 63.0 in | Wt 160.0 lb

## 2014-09-15 DIAGNOSIS — Z3009 Encounter for other general counseling and advice on contraception: Secondary | ICD-10-CM

## 2014-09-15 DIAGNOSIS — Z3202 Encounter for pregnancy test, result negative: Secondary | ICD-10-CM

## 2014-09-15 LAB — POCT URINE PREGNANCY: Preg Test, Ur: NEGATIVE

## 2014-09-15 NOTE — Progress Notes (Signed)
Patient ID: Sandra Glover, female   DOB: 09/06/1986, 28 y.o.   MRN: [id number]   [facility]  Patient name: Sandra HomansJessica H Glover MRN [id number]  Date of birth: 09/06/1986  CC & HPI:  Sandra Glover is a 28 y.o. Caucasian female presenting today for nexplanon placement, however she had sex last night, used a condom.  Feels better from uti last week.   Pertinent History Reviewed:  Medical & Surgical Hx:   Past Medical History  Diagnosis Date  . History of anemia 2013  . Sinus infection 01/25/2013    started antibiotic 01/24/2013 x 10 days; nasal congestion, clear drainage from nose  . History of depression     states stopped med. in 2013  . Tonsillar and adenoid hypertrophy 01/2013  . 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 Vaginal irritation 03/25/2014    Mild yeast but has history of yeast, will not treat til after first trimester,try yogurt  . Gestational diabetes mellitus, antepartum   . Pertussis    Past Surgical History  Procedure Laterality Date  . Cholecystectomy  04/23/2010    laparoscopic  . Partial nephrectomy Right age 26 mos.  . Tonsillectomy and adenoidectomy N/A 01/29/2013    Procedure: TONSILLECTOMY AND ADENOIDECTOMY;  Surgeon: Darletta MollSui W Teoh, MD;  Location: [facility];  Service: ENT;  Laterality: N/A;   Medications: Reviewed & Updated - see associated section Social History: Reviewed -  reports that she has never smoked. She has never used smokeless tobacco.  Objective Findings:  Vitals: BP 118/70  Ht 5\' 3"  (1.6 m)  Wt 160 lb (72.576 kg)  BMI 28.35 kg/m2  Breastfeeding? Yes  Physical Examination: General appearance - alert, well appearing, and in no distress  Results for orders placed in visit on 09/15/14 (from the past 24 hour(s))  POCT URINE PREGNANCY   Collection Time    09/15/14  9:07 AM      Result Value Ref Range   Preg Test, Ur Negative       Assessment & Plan:  A:    Unable to place nexplanon today d/t sex last night P:  Offered 10d appt w/ [facility] am/insertion pm or 2wk appt w/ upt & insertion, pt preferred 2wk.   Abstinence until nexplanon placed  F/U 2wks for upt & nexplanon insertion   Marge DuncansBooker, Eliese Kerwood Randall CNM, [facility] 09/15/2014 9:23 AM

## 2014-09-19 ENCOUNTER — Other Ambulatory Visit: Payer: Self-pay

## 2014-09-24 ENCOUNTER — Other Ambulatory Visit (HOSPITAL_COMMUNITY): Payer: Self-pay | Admitting: Psychiatry

## 2014-09-29 ENCOUNTER — Ambulatory Visit (INDEPENDENT_AMBULATORY_CARE_PROVIDER_SITE_OTHER): Payer: PRIVATE HEALTH INSURANCE | Admitting: Women's Health

## 2014-09-29 ENCOUNTER — Encounter: Payer: Self-pay | Admitting: Women's Health

## 2014-09-29 VITALS — BP 122/64 | Ht 63.0 in | Wt 158.0 lb

## 2014-09-29 DIAGNOSIS — Z3202 Encounter for pregnancy test, result negative: Secondary | ICD-10-CM

## 2014-09-29 DIAGNOSIS — Z3049 Encounter for surveillance of other contraceptives: Secondary | ICD-10-CM

## 2014-09-29 DIAGNOSIS — Z30017 Encounter for initial prescription of implantable subdermal contraceptive: Secondary | ICD-10-CM

## 2014-09-29 DIAGNOSIS — Z308 Encounter for other contraceptive management: Secondary | ICD-10-CM

## 2014-09-29 LAB — POCT URINE PREGNANCY: Preg Test, Ur: NEGATIVE

## 2014-09-29 NOTE — Patient Instructions (Signed)
You will have your sugar test next visit.  Please do not eat or drink anything after midnight the night before you come, not even water.  You will be here for at least two hours.     Keep the area clean and dry.  You can remove the big bandage in 24 hours, and the small steri-strip bandage in 3-5 days.  A back up method, such as condoms, should be used for two weeks. You may have irregular vaginal bleeding for the first 6 months after the Nexplanon is placed, then the bleeding usually lightens and it is possible that you may not have any periods.  If you have any concerns, please give us a call.    Etonogestrel implant- Nexplanon What is this medicine? ETONOGESTREL (et oh noe JES trel) is a contraceptive (birth control) device. It is used to prevent pregnancy. It can be used for up to 3 years. This medicine may be used for other purposes; ask your health care provider or pharmacist if you have questions. COMMON BRAND NAME(S): Implanon, Nexplanon What should I tell my health care provider before I take this medicine? They need to know if you have any of these conditions: -abnormal vaginal bleeding -blood vessel disease or blood clots -cancer of the breast, cervix, or liver -depression -diabetes -gallbladder disease -headaches -heart disease or recent heart attack -high blood pressure -high cholesterol -kidney disease -liver disease -renal disease -seizures -tobacco smoker -an unusual or allergic reaction to etonogestrel, other hormones, anesthetics or antiseptics, medicines, foods, dyes, or preservatives -pregnant or trying to get pregnant -breast-feeding How should I use this medicine? This device is inserted just under the skin on the inner side of your upper arm by a health care professional. Talk to your pediatrician regarding the use of this medicine in children. Special care may be needed. Overdosage: If you think you've taken too much of this medicine contact a poison control  center or emergency room at once. Overdosage: If you think you have taken too much of this medicine contact a poison control center or emergency room at once. NOTE: This medicine is only for you. Do not share this medicine with others. What if I miss a dose? This does not apply. What may interact with this medicine? Do not take this medicine with any of the following medications: -amprenavir -bosentan -fosamprenavir This medicine may also interact with the following medications: -barbiturate medicines for inducing sleep or treating seizures -certain medicines for fungal infections like ketoconazole and itraconazole -griseofulvin -medicines to treat seizures like carbamazepine, felbamate, oxcarbazepine, phenytoin, topiramate -modafinil -phenylbutazone -rifampin -some medicines to treat HIV infection like atazanavir, indinavir, lopinavir, nelfinavir, tipranavir, ritonavir -St. John's wort This list may not describe all possible interactions. Give your health care provider a list of all the medicines, herbs, non-prescription drugs, or dietary supplements you use. Also tell them if you smoke, drink alcohol, or use illegal drugs. Some items may interact with your medicine. What should I watch for while using this medicine? This product does not protect you against HIV infection (AIDS) or other sexually transmitted diseases. You should be able to feel the implant by pressing your fingertips over the skin where it was inserted. Tell your doctor if you cannot feel the implant. What side effects may I notice from receiving this medicine? Side effects that you should report to your doctor or health care professional as soon as possible: -allergic reactions like skin rash, itching or hives, swelling of the face, lips, or tongue -breast  lumps -changes in vision -confusion, trouble speaking or understanding -dark urine -depressed mood -general ill feeling or flu-like symptoms -light-colored  stools -loss of appetite, nausea -right upper belly pain -severe headaches -severe pain, swelling, or tenderness in the abdomen -shortness of breath, chest pain, swelling in a leg -signs of pregnancy -sudden numbness or weakness of the face, arm or leg -trouble walking, dizziness, loss of balance or coordination -unusual vaginal bleeding, discharge -unusually weak or tired -yellowing of the eyes or skin Side effects that usually do not require medical attention (Report these to your doctor or health care professional if they continue or are bothersome.): -acne -breast pain -changes in weight -cough -fever or chills -headache -irregular menstrual bleeding -itching, burning, and vaginal discharge -pain or difficulty passing urine -sore throat This list may not describe all possible side effects. Call your doctor for medical advice about side effects. You may report side effects to FDA at 1-800-FDA-1088. Where should I keep my medicine? This drug is given in a hospital or clinic and will not be stored at home. NOTE: This sheet is a summary. It may not cover all possible information. If you have questions about this medicine, talk to your doctor, pharmacist, or health care provider.  2015, Elsevier/Gold Standard. (2012-05-28 15:37:45)

## 2014-09-29 NOTE — Progress Notes (Signed)
Patient ID: Sandra Glover, female   DOB: 1986-08-06, 28 y.o.   MRN: [id number] Sandra HomansJessica H Glover is a 28 y.o. year old Caucasian female here for Nexplanon insertion.  No LMP recorded., last sexual intercourse was 09/14/14, and her pregnancy test today was negative.  Risks/benefits/side effects of Nexplanon have been discussed and her questions have been answered.  Specifically, a failure rate of 12/998 has been reported, with an increased failure rate if pt takes St. John's Wort and/or antiseizure medicaitons.  Sandra Glover is aware of the common side effect of irregular bleeding, which the incidence of decreases over time.  She is also aware of potential for worsening depression, she is currently on celexa 20mg  daily and sees pyschologist monthly- understands to let one of us know if feels like depression worsens.   BP 122/64  Ht 5\' 3"  (1.6 m)  Wt 158 lb (71.668 kg)  BMI 28.00 kg/m2  Breastfeeding? Yes  Results for orders placed in visit on 09/29/14 (from the past 24 hour(s))  POCT URINE PREGNANCY   Collection Time    09/29/14  8:52 AM      Result Value Ref Range   Preg Test, Ur Negative       She is right-handed, so her left arm, approximately 4 inches proximal from the elbow, was cleansed with alcohol and anesthetized with 2cc of 2% Lidocaine.  The area was cleansed again with betadine and the Nexplanon was inserted per manufacturer's recommendations without difficulty.  A steri-strip and pressure bandage were applied.  Pt was instructed to keep the area clean and dry, remove pressure bandage in 24 hours, and keep insertion site covered with the steri-strip for 3-5 days.  Back up contraception was recommended for 2 weeks.  She was given a card indicating date Nexplanon was inserted and date it needs to be removed. Follow-up 11/3 as scheduled for pap & physical and 2hr gtt for h/o A1DM. Has f/u w/ psychologist on Friday.   Marge DuncansBooker, Daylani Deblois Randall CNM, [facility] 09/29/2014 9:03 AM

## 2014-10-02 ENCOUNTER — Ambulatory Visit (HOSPITAL_COMMUNITY): Payer: Self-pay | Admitting: Psychiatry

## 2014-10-03 ENCOUNTER — Encounter (HOSPITAL_COMMUNITY): Payer: Self-pay | Admitting: Psychiatry

## 2014-10-03 ENCOUNTER — Ambulatory Visit (INDEPENDENT_AMBULATORY_CARE_PROVIDER_SITE_OTHER): Payer: Medicaid Other | Admitting: Psychiatry

## 2014-10-03 VITALS — BP 132/80 | HR 96 | Ht 63.0 in | Wt 157.6 lb

## 2014-10-03 DIAGNOSIS — F332 Major depressive disorder, recurrent severe without psychotic features: Secondary | ICD-10-CM | POA: Diagnosis not present

## 2014-10-03 DIAGNOSIS — F331 Major depressive disorder, recurrent, moderate: Secondary | ICD-10-CM

## 2014-10-03 MED ORDER — CITALOPRAM HYDROBROMIDE 40 MG PO TABS: 40.0000 mg | ORAL_TABLET | Freq: Every day | ORAL | Status: DC

## 2014-10-03 MED ORDER — CLONAZEPAM 0.5 MG PO TABS: 0.5000 mg | ORAL_TABLET | Freq: Every day | ORAL | Status: DC | PRN
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
## 2014-10-06 ENCOUNTER — Encounter (HOSPITAL_COMMUNITY): Payer: Self-pay | Admitting: Psychiatry

## 2014-10-07 ENCOUNTER — Encounter: Payer: PRIVATE HEALTH INSURANCE | Admitting: Women's Health

## 2014-10-07 ENCOUNTER — Other Ambulatory Visit: Payer: PRIVATE HEALTH INSURANCE

## 2014-10-07 ENCOUNTER — Encounter: Payer: Self-pay | Admitting: Women's Health

## 2014-10-07 DIAGNOSIS — O24429 Gestational diabetes mellitus in childbirth, unspecified control: Secondary | ICD-10-CM

## 2014-10-08 ENCOUNTER — Ambulatory Visit (INDEPENDENT_AMBULATORY_CARE_PROVIDER_SITE_OTHER): Payer: No Typology Code available for payment source | Admitting: Psychiatry

## 2014-10-08 DIAGNOSIS — F331 Major depressive disorder, recurrent, moderate: Secondary | ICD-10-CM | POA: Diagnosis not present

## 2014-10-08 LAB — GLUCOSE TOLERANCE, 2 HOURS W/ 1HR
Glucose, 1 hour: 176 mg/dL — ABNORMAL HIGH (ref 70–170)
Glucose, 2 hour: 70 mg/dL (ref 70–139)
Glucose, Fasting: 70 mg/dL (ref 70–99)
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
## 2014-10-08 NOTE — Progress Notes (Signed)
This encounter was created in error - please disregard.

## 2014-10-08 NOTE — Patient Instructions (Signed)
Discussed orally 

## 2014-10-29 ENCOUNTER — Encounter (HOSPITAL_COMMUNITY): Payer: Self-pay | Admitting: Psychiatry

## 2014-10-29 ENCOUNTER — Ambulatory Visit (INDEPENDENT_AMBULATORY_CARE_PROVIDER_SITE_OTHER): Payer: No Typology Code available for payment source | Admitting: Psychiatry

## 2014-10-29 VITALS — BP 100/68 | Ht 63.0 in | Wt 154.0 lb

## 2014-10-29 DIAGNOSIS — F331 Major depressive disorder, recurrent, moderate: Secondary | ICD-10-CM

## 2014-10-29 DIAGNOSIS — F332 Major depressive disorder, recurrent severe without psychotic features: Secondary | ICD-10-CM

## 2014-10-29 MED ORDER — CITALOPRAM HYDROBROMIDE 40 MG PO TABS: 40.0000 mg | ORAL_TABLET | Freq: Every day | ORAL | Status: DC
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
## 2014-11-11 ENCOUNTER — Ambulatory Visit (INDEPENDENT_AMBULATORY_CARE_PROVIDER_SITE_OTHER): Payer: No Typology Code available for payment source | Admitting: Psychiatry

## 2014-11-11 DIAGNOSIS — F330 Major depressive disorder, recurrent, mild: Secondary | ICD-10-CM
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
## 2014-11-11 NOTE — Patient Instructions (Signed)
Discussed orally 

## 2014-12-25 ENCOUNTER — Telehealth: Payer: Self-pay | Admitting: Gastroenterology

## 2014-12-25 ENCOUNTER — Encounter: Payer: Self-pay | Admitting: Gastroenterology

## 2014-12-25 ENCOUNTER — Encounter (INDEPENDENT_AMBULATORY_CARE_PROVIDER_SITE_OTHER): Payer: Medicaid Other | Admitting: Gastroenterology

## 2014-12-25 NOTE — Progress Notes (Signed)
   Subjective:    Patient ID: Sandra HomansJessica H Sumlin, female    DOB: [birth year], 29 y.o.   MRN: [id number]  HPI   Past Medical History  Diagnosis Date  . History of anemia 2013  . Sinus infection 01/25/2013    started antibiotic 01/24/2013 x 10 days; nasal congestion, clear drainage from nose  . History of depression     states stopped med. in 2013  . Tonsillar and adenoid hypertrophy 01/2013  . History of partial nephrectomy age 27 mos.    right - due to kidney infection  . Vaginal irritation 03/25/2014    Mild yeast but has history of yeast, will not treat til after first trimester,try yogurt  . Gestational diabetes mellitus, antepartum   . Pertussis     Review of Systems     Objective:   Physical Exam        Assessment & Plan:

## 2014-12-25 NOTE — Telephone Encounter (Signed)
PATIENT WAS A NO SHOW 12/25/14 AND LETTER WAS SENT °

## 2014-12-25 NOTE — Telephone Encounter (Signed)
REVIEWED-NO ADDITIONAL RECOMMENDATIONS. 

## 2015-01-03 ENCOUNTER — Other Ambulatory Visit: Payer: Self-pay | Admitting: Women's Health

## 2015-01-05 NOTE — Telephone Encounter (Signed)
Pt states she did not order a refill of the Diflucan, she did order another one of her medications so she states it must have been a pharmacy error, she is not requesting any Diflucan.

## 2015-01-05 NOTE — Telephone Encounter (Signed)
-----   Message from Marge DuncansKimberly Randall Booker, [place] sent at 01/05/2015 10:31 AM EST ----- Will you see why she needs the diflucan? If thinks has yeast, will need appt. Last time I gave it to her was prophylactically w/ antibiotic for uti.  Thanks,  Selena BattenKim

## 2015-01-05 NOTE — Telephone Encounter (Signed)
-----   Message from Kimberly Randall Booker, CNM sent at 01/05/2015 10:31 AM EST ----- Will you see why she needs the diflucan? If thinks has yeast, will need appt. Last time I gave it to her was prophylactically w/ antibiotic for uti.  Thanks,  Kim 

## 2015-01-29 ENCOUNTER — Encounter (HOSPITAL_COMMUNITY): Payer: Self-pay | Admitting: Psychiatry

## 2015-01-29 ENCOUNTER — Ambulatory Visit (INDEPENDENT_AMBULATORY_CARE_PROVIDER_SITE_OTHER): Payer: No Typology Code available for payment source | Admitting: Psychiatry

## 2015-01-29 DIAGNOSIS — F331 Major depressive disorder, recurrent, moderate: Secondary | ICD-10-CM

## 2015-01-29 MED ORDER — CITALOPRAM HYDROBROMIDE 40 MG PO TABS: 40.0000 mg | ORAL_TABLET | Freq: Every day | ORAL | Status: DC

## 2015-01-29 MED ORDER — CLONAZEPAM 0.5 MG PO TABS: 0.5000 mg | ORAL_TABLET | Freq: Every day | ORAL | Status: DC | PRN
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
## 2015-03-11 ENCOUNTER — Telehealth (HOSPITAL_COMMUNITY): Payer: Self-pay | Admitting: *Deleted

## 2015-03-13 ENCOUNTER — Ambulatory Visit (INDEPENDENT_AMBULATORY_CARE_PROVIDER_SITE_OTHER): Payer: No Typology Code available for payment source | Admitting: Psychiatry

## 2015-03-13 ENCOUNTER — Encounter (HOSPITAL_COMMUNITY): Payer: Self-pay | Admitting: Psychiatry

## 2015-03-13 VITALS — BP 132/84 | HR 90 | Ht 63.0 in | Wt 152.0 lb

## 2015-03-13 DIAGNOSIS — F331 Major depressive disorder, recurrent, moderate: Secondary | ICD-10-CM

## 2015-03-13 DIAGNOSIS — F332 Major depressive disorder, recurrent severe without psychotic features: Secondary | ICD-10-CM

## 2015-03-13 MED ORDER — FLUOXETINE HCL 40 MG PO CAPS: 40.0000 mg | ORAL_CAPSULE | Freq: Every day | ORAL | Status: DC
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
## 2015-03-16 ENCOUNTER — Ambulatory Visit (INDEPENDENT_AMBULATORY_CARE_PROVIDER_SITE_OTHER): Payer: No Typology Code available for payment source | Admitting: Psychiatry

## 2015-03-16 DIAGNOSIS — F331 Major depressive disorder, recurrent, moderate: Secondary | ICD-10-CM

## 2015-03-16 NOTE — Progress Notes (Signed)
THERAPIST PROGRESS NOTE  Session Time: Monday 03/16/2015 3:05 PM - 3:58 PM Participation Level: Active    Behavioral Response: CasualAlert/Euthymic  Type of Therapy: Individual Therapy  Treatment Goals addressed:  Increase self acceptance  Improve ability to manage stress and anxiety in healthy ways  Process and resolve issues and feelings of anger, and appropriate guilt, and loss related to past issues in marriage   Interventions: CBT and Supportive  Summary: Sandra Glover is a 29 y.o. female who presents with a history of symptoms of depression beginning in 2009 a few days after her wedding when her aunt with whom she was very close died suddenly. She began taking an anti-depressant at that time but discontinued after about 2 1/2 years and reports doing well without the medication for about 1 1/2 years. She began to experience marital stress as husband was not very involved when they had their first child 4 years ago. Patient reports husband left her at the hospital on the night of son's birth and didn't really assist her with parenting responsibilities. He also wouldn't go anywhere with patient and son in public as husband has self-esteem issues and fear of failure per patient's report. Patient reports eventually having an affair with a coworker after husband shut down and shut out patient when she confronted him about their marriage. However, she ended the affair and recommitted to the marriage about 3 years ago. She reports learning last year during the process of buying their first home, husband had been having an affair with his co Advertising account planner. Patient states she felt like she relapsed with depression at that time. She states having feelings of worthlessness, not wanting to get out of bed, no interest in make-up or doing her hair, and difficulty concentrating at work. She also reports periods of screaming, yelling, and throwing items feeling as though she is losing a grip. Patient  and husband currently are working on their marriage and have started marital counseling with their pastor's wife. Husband also is receiving individual therapy. Patient continues to have trust issues dating back to husband cheating on her when they were dating.    The patient last was seen in December 2015. She reports increased stress in recent weeks due to husband losing benefits and having a reduced salary as he is going from a full time position to a part time position in order to continue attending school. She reports husband has been more irritable and argumentative and states he also suffers from depression. He has begun taking Wellbutrin and patient is hopeful his behavior and mood will improve. She also reports stress related to her job as her benefits are being discontinued effective May 1 as her company is losing money. Patient now has to look for an individual health plan. She also reports stress related to job responsibilities and duties as her coworkers often call out or leave work expecting her to cover their responsibilities. She is looking for another job and has gone on one job interview which went well. Patient reports feeling overwhelmed and depressed. She admits having fleeting suicidal ideations this past weekend but reports thinking about her children helped her not to do well on these thoughts. She also talked with her husband. Patient reports poor self care patterns and experiencing low energy. She also is experiencing sleep difficulty   Suicidal/Homicidal: Patient admits having fleeting suicidal ideations last weekend but denies any current suicidal ideations. Patient agrees to call this practice, 911, or have someone take  her to the emergency room should symptoms worsen.  Therapist Response: Therapist works with patient to process feelings, identify ways to improve assertiveness skills and to set and maintain boundaries, identifying ways to improve self-care, review relaxation  techniques   Plan: Patient agrees to return for an appointment in 2-3 weeks.  Diagnosis: Axis I: MDD    Axis II: No diagnosis    Novia Lansberry, LCSW 03/16/2015

## 2015-03-16 NOTE — Patient Instructions (Signed)
Discussed orally 

## 2015-04-03 ENCOUNTER — Ambulatory Visit (INDEPENDENT_AMBULATORY_CARE_PROVIDER_SITE_OTHER): Payer: No Typology Code available for payment source | Admitting: Psychiatry

## 2015-04-03 ENCOUNTER — Encounter (HOSPITAL_COMMUNITY): Payer: Self-pay | Admitting: Psychiatry

## 2015-04-03 VITALS — BP 106/65 | HR 68 | Ht 63.0 in | Wt 153.0 lb

## 2015-04-03 DIAGNOSIS — F331 Major depressive disorder, recurrent, moderate: Secondary | ICD-10-CM | POA: Diagnosis not present

## 2015-04-03 MED ORDER — CLONAZEPAM 0.5 MG PO TABS: 0.5000 mg | ORAL_TABLET | Freq: Every day | ORAL | Status: DC | PRN
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
## 2015-04-08 ENCOUNTER — Encounter (HOSPITAL_COMMUNITY): Payer: Self-pay | Admitting: *Deleted

## 2015-04-08 ENCOUNTER — Ambulatory Visit (HOSPITAL_COMMUNITY): Payer: Self-pay | Admitting: Psychiatry

## 2015-04-29 ENCOUNTER — Ambulatory Visit (HOSPITAL_COMMUNITY): Payer: Self-pay | Admitting: Psychiatry

## 2015-06-01 ENCOUNTER — Other Ambulatory Visit: Payer: Self-pay

## 2015-06-03 ENCOUNTER — Encounter (HOSPITAL_COMMUNITY): Payer: Self-pay | Admitting: Psychiatry

## 2015-06-03 ENCOUNTER — Ambulatory Visit (INDEPENDENT_AMBULATORY_CARE_PROVIDER_SITE_OTHER): Payer: BLUE CROSS/BLUE SHIELD | Admitting: Psychiatry

## 2015-06-03 VITALS — BP 125/79 | HR 75 | Ht 63.0 in | Wt 153.6 lb

## 2015-06-03 DIAGNOSIS — F332 Major depressive disorder, recurrent severe without psychotic features: Secondary | ICD-10-CM

## 2015-06-03 DIAGNOSIS — F331 Major depressive disorder, recurrent, moderate: Secondary | ICD-10-CM

## 2015-06-03 MED ORDER — CLONAZEPAM 0.5 MG PO TABS: 0.5000 mg | ORAL_TABLET | Freq: Every day | ORAL | Status: DC | PRN

## 2015-06-03 MED ORDER — FLUOXETINE HCL 40 MG PO CAPS: 40.0000 mg | ORAL_CAPSULE | Freq: Every day | ORAL | Status: DC
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
## 2015-06-05 ENCOUNTER — Other Ambulatory Visit: Payer: Self-pay | Admitting: Women's Health

## 2015-06-09 ENCOUNTER — Other Ambulatory Visit (HOSPITAL_COMMUNITY): Payer: Self-pay | Admitting: Psychiatry

## 2015-06-18 ENCOUNTER — Ambulatory Visit (INDEPENDENT_AMBULATORY_CARE_PROVIDER_SITE_OTHER): Payer: BLUE CROSS/BLUE SHIELD | Admitting: Psychiatry

## 2015-06-18 DIAGNOSIS — F331 Major depressive disorder, recurrent, moderate: Secondary | ICD-10-CM | POA: Diagnosis not present

## 2015-06-18 NOTE — Patient Instructions (Signed)
Discussed orally 
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
## 2015-06-23 ENCOUNTER — Encounter: Payer: Self-pay | Admitting: Women's Health

## 2015-06-23 ENCOUNTER — Ambulatory Visit (INDEPENDENT_AMBULATORY_CARE_PROVIDER_SITE_OTHER): Payer: BLUE CROSS/BLUE SHIELD | Admitting: Women's Health

## 2015-06-23 VITALS — BP 118/70 | HR 76 | Wt 159.0 lb

## 2015-06-23 DIAGNOSIS — N930 Postcoital and contact bleeding: Secondary | ICD-10-CM | POA: Diagnosis not present

## 2015-06-23 DIAGNOSIS — N760 Acute vaginitis: Secondary | ICD-10-CM

## 2015-06-23 DIAGNOSIS — B9689 Other specified bacterial agents as the cause of diseases classified elsewhere: Secondary | ICD-10-CM

## 2015-06-23 DIAGNOSIS — A499 Bacterial infection, unspecified: Secondary | ICD-10-CM

## 2015-06-23 LAB — POCT WET PREP (WET MOUNT): Clue Cells Wet Prep Whiff POC: POSITIVE

## 2015-06-23 MED ORDER — METRONIDAZOLE 500 MG PO TABS: 500.0000 mg | ORAL_TABLET | Freq: Two times a day (BID) | ORAL | Status: DC

## 2015-06-23 NOTE — Progress Notes (Signed)
Patient ID: Sandra Glover, female   DOB: October 19, 1986, 29 y.o.   MRN: [id number]   [facility]  Patient name: Sandra Glover MRN [id number]  Date of birth: October 19, 1986  CC & HPI:  Sandra Glover is a 29 y.o. Caucasian female presenting today for report of postcoital bleeding x ~191mth, recent self-dx yeast infection otc monistat helped. Most recent episode of postcoital vb started thurs night and has continued- heavy like a period- thinks it could be her period, but a little early. Denies dyspareunia. Has Nexplanon- usually bleeds around same time monthly. Last pap 2014 neg.   Pertinent History Reviewed:  Medical & Surgical Hx:   Past Medical History  Diagnosis Date  . History of anemia 2013  . Sinus infection 01/25/2013    started antibiotic 01/24/2013 x 10 days; nasal congestion, clear drainage from nose  . History of depression     states stopped med. in 2013  . Tonsillar and adenoid hypertrophy 01/2013  . History of partial nephrectomy age 27 mos.    right - due to kidney infection  . Vaginal irritation 03/25/2014    Mild yeast but has history of yeast, will not treat til after first trimester,try yogurt  . Gestational diabetes mellitus, antepartum   . Pertussis    Past Surgical History  Procedure Laterality Date  . Cholecystectomy  04/23/2010    laparoscopic  . Partial nephrectomy Right age 27 mos.  . Tonsillectomy and adenoidectomy N/A 01/29/2013    Procedure: TONSILLECTOMY AND ADENOIDECTOMY;  Surgeon: Darletta MollSui W Teoh, MD;  Location: [facility];  Service: ENT;  Laterality: N/A;   Medications: Reviewed & Updated - see associated section Social History: Reviewed -  reports that she has never smoked. She has never used smokeless tobacco.  Objective Findings:  Vitals: BP 118/70 mmHg  Pulse 76  Wt 159 lb (72.122 kg)  LMP 06/18/2015  Breastfeeding? Yes  Physical Examination: General appearance - alert, well appearing, and in no distress Pelvic -  menstrual type bleeding- slightly malodorous, no polyps noted  Results for orders placed or performed in visit on 06/23/15 (from the past 24 hour(s))  POCT Wet Prep [facility] Drown([facility])     Status: Abnormal   Collection Time: 06/23/15 12:05 PM  Result Value Ref Range   Source Wet Prep POC vaginal    WBC, Wet Prep HPF POC few    Bacteria Wet Prep HPF POC None None, Few   BACTERIA WET PREP MORPHOLOGY POC     Clue Cells Wet Prep HPF POC Many (A) None   Clue Cells Wet Prep Whiff POC Positive Whiff    Yeast Wet Prep HPF POC None    KOH Wet Prep POC     Trichomonas Wet Prep HPF POC none      Assessment & Plan:  A:   Poistcoital bleeding  BV P:  GC/CT from urine  Rx flagyl 500mg  bid x 7d, no sex or etoh while taking   F/U 4265yr for pap & physical   Marge DuncansBooker, Joeline Freer Randall CNM, [facility]NP-BC 06/23/2015 11:55 AM

## 2015-06-23 NOTE — Patient Instructions (Signed)
Bacterial Vaginosis Bacterial vaginosis is a vaginal infection that occurs when the normal balance of bacteria in the vagina is disrupted. It results from an overgrowth of certain bacteria. This is the most common vaginal infection in women of childbearing age. Treatment is important to prevent complications, especially in pregnant women, as it can cause a premature delivery. CAUSES  Bacterial vaginosis is caused by an increase in harmful bacteria that are normally present in smaller amounts in the vagina. Several different kinds of bacteria can cause bacterial vaginosis. However, the reason that the condition develops is not fully understood. RISK FACTORS Certain activities or behaviors can put you at an increased risk of developing bacterial vaginosis, including:  Having a new sex partner or multiple sex partners.  Douching.  Using an intrauterine device (IUD) for contraception. Women do not get bacterial vaginosis from toilet seats, bedding, swimming pools, or contact with objects around them. SIGNS AND SYMPTOMS  Some women with bacterial vaginosis have no signs or symptoms. Common symptoms include:  Grey vaginal discharge.  A fishlike odor with discharge, especially after sexual intercourse.  Itching or burning of the vagina and vulva.  Burning or pain with urination. DIAGNOSIS  Your health care provider will take a medical history and examine the vagina for signs of bacterial vaginosis. A sample of vaginal fluid may be taken. Your health care provider will look at this sample under a microscope to check for bacteria and abnormal cells. A vaginal pH test may also be done.  TREATMENT  Bacterial vaginosis may be treated with antibiotic medicines. These may be given in the form of a pill or a vaginal cream. A second round of antibiotics may be prescribed if the condition comes back after treatment.  HOME CARE INSTRUCTIONS   Only take over-the-counter or prescription medicines as  directed by your health care provider.  If antibiotic medicine was prescribed, take it as directed. Make sure you finish it even if you start to feel better.  Do not have sex until treatment is completed.  Tell all sexual partners that you have a vaginal infection. They should see their health care provider and be treated if they have problems, such as a mild rash or itching.  Practice safe sex by using condoms and only having one sex partner. SEEK MEDICAL CARE IF:   Your symptoms are not improving after 3 days of treatment.  You have increased discharge or pain.  You have a fever. MAKE SURE YOU:   Understand these instructions.  Will watch your condition.  Will get help right away if you are not doing well or get worse. FOR MORE INFORMATION  Centers for Disease Control and Prevention, Division of STD Prevention: www.cdc.gov/std American Sexual Health Association (ASHA): www.ashastd.org  Document Released: 11/21/2005 Document Revised: 09/11/2013 Document Reviewed: 07/03/2013 ExitCare Patient Information 2015 [employer]. This information is not intended to replace advice given to you by your health care provider. Make sure you discuss any questions you have with your health care provider.  

## 2015-06-24 LAB — GC/CHLAMYDIA PROBE AMP
Chlamydia trachomatis, NAA: NEGATIVE
Neisseria gonorrhoeae by PCR: NEGATIVE

## 2015-06-26 ENCOUNTER — Telehealth: Payer: Self-pay | Admitting: Women's Health

## 2015-06-26 NOTE — Telephone Encounter (Signed)
Pt aware of results 

## 2015-07-08 ENCOUNTER — Telehealth: Payer: Self-pay | Admitting: *Deleted

## 2015-07-08 NOTE — Telephone Encounter (Signed)
Pt states continues to have vaginal bleeding after sex. Pt finished Flagyl on 06/30/15 for BV. Pt given an appt with Cyril Mourning, NP for Aug. 9, 2016.

## 2015-07-14 ENCOUNTER — Ambulatory Visit (INDEPENDENT_AMBULATORY_CARE_PROVIDER_SITE_OTHER): Payer: BLUE CROSS/BLUE SHIELD | Admitting: Adult Health

## 2015-07-14 ENCOUNTER — Encounter: Payer: Self-pay | Admitting: Adult Health

## 2015-07-14 VITALS — BP 102/78 | HR 64 | Ht 63.0 in | Wt 159.0 lb

## 2015-07-14 DIAGNOSIS — N930 Postcoital and contact bleeding: Secondary | ICD-10-CM | POA: Diagnosis not present

## 2015-07-14 HISTORY — DX: Postcoital and contact bleeding: N93.0

## 2015-07-14 NOTE — Progress Notes (Signed)
Subjective:     Patient ID: Sandra Glover, female   DOB: November 08, 1986, 29 y.o.   MRN: [id number]  HPI Sandra Glover is a 29 year old white female, in complaining of continued bleeding after sex, may bleed 1-3 days and its like a period.This has been happening about 2 months now,she saw [name] about 2 weeks ago and was treated for BV and she has seen her PCP, who is [name] in [place] and labs were normal.  Review of Systems Patient denies any headaches, hearing loss, fatigue, blurred vision, shortness of breath, chest pain, abdominal pain, problems with bowel movements, urination. No joint pain or mood swings. See HPI for positives. Reviewed past medical,surgical, social and family history. Reviewed medications and allergies.     Objective:   Physical Exam BP 102/78 mmHg  Pulse 64  Ht  (1.6 m)  Wt 159 lb (72.122 kg)  BMI 28.17 kg/m2  LMP 06/18/2015  Breastfeeding? Yes Skin warm and dry.Pelvic: external genitalia is normal in appearance no lesions, vagina: period like blood with out odor,urethra has no lesions or masses noted, cervix:smooth and bulbous, with irregular os, no CMT, uterus: normal size, shape and contour, mildly tender, no masses felt, adnexa: no masses or tenderness noted. Bladder is non tender and no masses felt. Since uterus is tender and bleeding is from uterus will get [place] to assess.    Assessment:     Postcoital bleeding    Plan:     Return in 3 days for gyn [place] No sex for now

## 2015-07-14 NOTE — Patient Instructions (Signed)
Return in 3 days for [place] Will talk after [place]

## 2015-07-17 ENCOUNTER — Ambulatory Visit (INDEPENDENT_AMBULATORY_CARE_PROVIDER_SITE_OTHER): Payer: BLUE CROSS/BLUE SHIELD

## 2015-07-17 DIAGNOSIS — N930 Postcoital and contact bleeding: Secondary | ICD-10-CM

## 2015-07-17 NOTE — Progress Notes (Signed)
US PELVIC TA/TV: normal anteverted uterus,normal ov's bilat(mobile), 2cm dominate follicle lt ov,EEC 4.60mm,no free fluid seen

## 2015-07-20 ENCOUNTER — Telehealth: Payer: Self-pay | Admitting: Adult Health

## 2015-07-20 NOTE — Telephone Encounter (Signed)
Pt aware US normal  

## 2015-07-28 ENCOUNTER — Ambulatory Visit (HOSPITAL_COMMUNITY): Payer: Self-pay | Admitting: Psychiatry

## 2015-08-18 ENCOUNTER — Other Ambulatory Visit: Payer: Self-pay | Admitting: Advanced Practice Midwife

## 2015-08-18 ENCOUNTER — Other Ambulatory Visit (HOSPITAL_COMMUNITY): Payer: Self-pay | Admitting: Psychiatry

## 2015-09-03 ENCOUNTER — Ambulatory Visit (INDEPENDENT_AMBULATORY_CARE_PROVIDER_SITE_OTHER): Payer: BLUE CROSS/BLUE SHIELD | Admitting: Psychiatry

## 2015-09-03 ENCOUNTER — Encounter (HOSPITAL_COMMUNITY): Payer: Self-pay | Admitting: Psychiatry

## 2015-09-03 VITALS — BP 122/83 | HR 64 | Ht 63.0 in | Wt 160.2 lb

## 2015-09-03 DIAGNOSIS — F331 Major depressive disorder, recurrent, moderate: Secondary | ICD-10-CM | POA: Diagnosis not present

## 2015-09-03 MED ORDER — CLONAZEPAM 0.5 MG PO TABS: 0.5000 mg | ORAL_TABLET | Freq: Every day | ORAL | Status: DC | PRN

## 2015-09-03 MED ORDER — FLUOXETINE HCL 40 MG PO CAPS: 40.0000 mg | ORAL_CAPSULE | Freq: Every day | ORAL | Status: DC
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
## 2015-09-06 ENCOUNTER — Emergency Department (HOSPITAL_COMMUNITY)
Admission: EM | Admit: 2015-09-06 | Discharge: 2015-09-07 | Disposition: A | Discharge status: Home / Self Care | Payer: BLUE CROSS/BLUE SHIELD | Attending: Emergency Medicine | Admitting: Emergency Medicine

## 2015-09-06 ENCOUNTER — Emergency Department (HOSPITAL_COMMUNITY): Payer: BLUE CROSS/BLUE SHIELD

## 2015-09-06 ENCOUNTER — Encounter (HOSPITAL_COMMUNITY): Payer: Self-pay | Admitting: *Deleted

## 2015-09-06 DIAGNOSIS — Z8742 Personal history of other diseases of the female genital tract: Secondary | ICD-10-CM | POA: Diagnosis not present

## 2015-09-06 DIAGNOSIS — Z8632 Personal history of gestational diabetes: Secondary | ICD-10-CM | POA: Insufficient documentation

## 2015-09-06 DIAGNOSIS — Y998 Other external cause status: Secondary | ICD-10-CM | POA: Diagnosis not present

## 2015-09-06 DIAGNOSIS — S0990XA Unspecified injury of head, initial encounter: Secondary | ICD-10-CM | POA: Diagnosis not present

## 2015-09-06 DIAGNOSIS — Z79899 Other long term (current) drug therapy: Secondary | ICD-10-CM | POA: Diagnosis not present

## 2015-09-06 DIAGNOSIS — Z8619 Personal history of other infectious and parasitic diseases: Secondary | ICD-10-CM | POA: Diagnosis not present

## 2015-09-06 DIAGNOSIS — Y9389 Activity, other specified: Secondary | ICD-10-CM | POA: Insufficient documentation

## 2015-09-06 DIAGNOSIS — S199XXA Unspecified injury of neck, initial encounter: Secondary | ICD-10-CM | POA: Diagnosis not present

## 2015-09-06 DIAGNOSIS — Y92410 Unspecified street and highway as the place of occurrence of the external cause: Secondary | ICD-10-CM | POA: Insufficient documentation

## 2015-09-06 DIAGNOSIS — Z8709 Personal history of other diseases of the respiratory system: Secondary | ICD-10-CM | POA: Diagnosis not present

## 2015-09-06 DIAGNOSIS — Z862 Personal history of diseases of the blood and blood-forming organs and certain disorders involving the immune mechanism: Secondary | ICD-10-CM | POA: Insufficient documentation

## 2015-09-06 DIAGNOSIS — F329 Major depressive disorder, single episode, unspecified: Secondary | ICD-10-CM | POA: Diagnosis not present

## 2015-09-06 DIAGNOSIS — Z3202 Encounter for pregnancy test, result negative: Secondary | ICD-10-CM | POA: Insufficient documentation
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
## 2015-09-07 LAB — URINE MICROSCOPIC-ADD ON

## 2015-09-07 LAB — URINALYSIS, ROUTINE W REFLEX MICROSCOPIC
Bilirubin Urine: NEGATIVE
Glucose, UA: NEGATIVE mg/dL
Ketones, ur: NEGATIVE mg/dL
Leukocytes, UA: NEGATIVE
Nitrite: NEGATIVE
Protein, ur: NEGATIVE mg/dL
Specific Gravity, Urine: <1.005 — ABNORMAL LOW (ref 1.005–1.030)
Urobilinogen, UA: 0.2 mg/dL (ref 0.0–1.0)
pH: 6 (ref 5.0–8.0)

## 2015-09-07 LAB — PREGNANCY, URINE: Preg Test, Ur: NEGATIVE

## 2015-09-07 MED ORDER — IBUPROFEN 800 MG PO TABS: 800.0000 mg | ORAL_TABLET | Freq: Three times a day (TID) | ORAL | Status: DC

## 2015-09-07 NOTE — Discharge Instructions (Signed)
Head Injury Follow up with your doctor. Use ibuprofen as needed for muscle soreness. Return to the ED if you develop new or worsening symptoms. You have received a head injury. It does not appear serious at this time. Headaches and vomiting are common following head injury. It should be easy to awaken from sleeping. Sometimes it is necessary for you to stay in the emergency department for a while for observation. Sometimes admission to the hospital may be needed. After injuries such as yours, most problems occur within the first 24 hours, but side effects may occur up to 7-10 days after the injury. It is important for you to carefully monitor your condition and contact your health care provider or seek immediate medical care if there is a change in your condition. WHAT ARE THE TYPES OF HEAD INJURIES? Head injuries can be as minor as a bump. Some head injuries can be more severe. More severe head injuries include:  A jarring injury to the brain (concussion).  A bruise of the brain (contusion). This mean there is bleeding in the brain that can cause swelling.  A cracked skull (skull fracture).  Bleeding in the brain that collects, clots, and forms a bump (hematoma). WHAT CAUSES A HEAD INJURY? A serious head injury is most likely to happen to someone who is in a car wreck and is not wearing a seat belt. Other causes of major head injuries include bicycle or motorcycle accidents, sports injuries, and falls. HOW ARE HEAD INJURIES DIAGNOSED? A complete history of the event leading to the injury and your current symptoms will be helpful in diagnosing head injuries. Many times, pictures of the brain, such as CT or MRI are needed to see the extent of the injury. Often, an overnight hospital stay is necessary for observation.  WHEN SHOULD I SEEK IMMEDIATE MEDICAL CARE?  You should get help right away if:  You have confusion or drowsiness.  You feel sick to your stomach (nauseous) or have continued,  forceful vomiting.  You have dizziness or unsteadiness that is getting worse.  You have severe, continued headaches not relieved by medicine. Only take over-the-counter or prescription medicines for pain, fever, or discomfort as directed by your health care provider.  You do not have normal function of the arms or legs or are unable to walk.  You notice changes in the black spots in the center of the colored part of your eye (pupil).  You have a clear or bloody fluid coming from your nose or ears.  You have a loss of vision. During the next 24 hours after the injury, you must stay with someone who can watch you for the warning signs. This person should contact local emergency services (911 in the [place] if you have seizures, you become unconscious, or you are unable to wake up. HOW CAN I PREVENT A HEAD INJURY IN THE FUTURE? The most important factor for preventing major head injuries is avoiding motor vehicle accidents. To minimize the potential for damage to your head, it is crucial to wear seat belts while riding in motor vehicles. Wearing helmets while bike riding and playing collision sports (like football) is also helpful. Also, avoiding dangerous activities around the house will further help reduce your risk of head injury.  WHEN CAN I RETURN TO NORMAL ACTIVITIES AND ATHLETICS? You should be reevaluated by your health care provider before returning to these activities. If you have any of the following symptoms, you should not return to activities or contact sports  until 1 week after the symptoms have stopped:  Persistent headache.  Dizziness or vertigo.  Poor attention and concentration.  Confusion.  Memory problems.  Nausea or vomiting.  Fatigue or tire easily.  Irritability.  Intolerant of bright lights or loud noises.  Anxiety or depression.  Disturbed sleep. MAKE SURE YOU:   Understand these instructions.  Will watch your condition.  Will get help right away if  you are not doing well or get worse. Document Released: 11/21/2005 Document Revised: 11/26/2013 Document Reviewed: 07/29/2013 [facility] Patient Information 2015 [place], [place]. This information is not intended to replace advice given to you by your health care provider. Make sure you discuss any questions you have with your health care provider.

## 2015-09-07 NOTE — ED Notes (Signed)
Ambulated pt in hall, tolerated well. No dizziness, minimal pain.  Tolerated fluid challenge as well. No concerns voiced at this time.

## 2015-09-07 NOTE — ED Notes (Signed)
Discharge instructions given, pt demonstrated teach back and verbal understanding. No concerns voiced.  

## 2015-11-25 ENCOUNTER — Other Ambulatory Visit (HOSPITAL_COMMUNITY): Payer: Self-pay | Admitting: Psychiatry

## 2015-12-01 ENCOUNTER — Ambulatory Visit (INDEPENDENT_AMBULATORY_CARE_PROVIDER_SITE_OTHER): Payer: Self-pay | Admitting: Psychiatry

## 2015-12-01 ENCOUNTER — Telehealth (HOSPITAL_COMMUNITY): Payer: Self-pay | Admitting: *Deleted

## 2015-12-01 ENCOUNTER — Encounter (HOSPITAL_COMMUNITY): Payer: Self-pay | Admitting: *Deleted

## 2015-12-01 ENCOUNTER — Ambulatory Visit (HOSPITAL_COMMUNITY): Payer: Self-pay | Admitting: Psychiatry

## 2015-12-01 ENCOUNTER — Encounter (HOSPITAL_COMMUNITY): Payer: Self-pay | Admitting: Psychiatry

## 2015-12-01 VITALS — BP 107/77 | HR 61 | Ht 63.0 in | Wt 154.6 lb

## 2015-12-01 DIAGNOSIS — F331 Major depressive disorder, recurrent, moderate: Secondary | ICD-10-CM

## 2015-12-01 MED ORDER — CLONAZEPAM 0.5 MG PO TABS: 0.5000 mg | ORAL_TABLET | Freq: Every day | ORAL | Status: DC | PRN

## 2015-12-01 MED ORDER — FLUOXETINE HCL 40 MG PO CAPS
40.0000 mg | ORAL_CAPSULE | Freq: Every day | ORAL | Status: DC
Start: 2015-12-01 — End: 2016-02-01

## 2015-12-01 NOTE — Telephone Encounter (Signed)
Pt called asking if Dr. Tenny Crawoss could write her a note for her Sandra MccallumJury Duty for Jan 10 due to her anxiety and depression. Pt was suppose to come into office to discuss this today 12-01-15 due cancelled late stating she is having car problems. Pt asked if Dr. Tenny Crawoss decides to, if office could fax letter to her at her work today. Pt number is [phone].

## 2015-12-01 NOTE — Telephone Encounter (Signed)
She will need to be seen, but i doubt she meets criteria to be excused
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
## 2015-12-01 NOTE — Telephone Encounter (Signed)
Lmtcb, number provided 

## 2015-12-01 NOTE — Telephone Encounter (Signed)
Informed pt and she will be coming into office for f/u this afternoon

## 2015-12-24 ENCOUNTER — Ambulatory Visit (HOSPITAL_COMMUNITY): Payer: Self-pay | Admitting: Psychiatry

## 2016-01-20 ENCOUNTER — Encounter (HOSPITAL_COMMUNITY): Payer: Self-pay | Admitting: *Deleted

## 2016-01-20 ENCOUNTER — Encounter (HOSPITAL_COMMUNITY): Payer: Self-pay | Admitting: Psychiatry

## 2016-01-20 ENCOUNTER — Ambulatory Visit (INDEPENDENT_AMBULATORY_CARE_PROVIDER_SITE_OTHER): Payer: BLUE CROSS/BLUE SHIELD | Admitting: Psychiatry

## 2016-01-20 DIAGNOSIS — F331 Major depressive disorder, recurrent, moderate: Secondary | ICD-10-CM | POA: Diagnosis not present
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
## 2016-01-20 NOTE — Patient Instructions (Signed)
Discussed orally 

## 2016-01-28 ENCOUNTER — Other Ambulatory Visit (HOSPITAL_COMMUNITY): Payer: Self-pay | Admitting: Psychiatry

## 2016-02-01 ENCOUNTER — Ambulatory Visit (INDEPENDENT_AMBULATORY_CARE_PROVIDER_SITE_OTHER): Payer: BLUE CROSS/BLUE SHIELD | Admitting: Psychiatry

## 2016-02-01 ENCOUNTER — Encounter (HOSPITAL_COMMUNITY): Payer: Self-pay | Admitting: Psychiatry

## 2016-02-01 VITALS — BP 123/84 | HR 63 | Ht 63.0 in | Wt 151.4 lb

## 2016-02-01 DIAGNOSIS — F331 Major depressive disorder, recurrent, moderate: Secondary | ICD-10-CM

## 2016-02-01 MED ORDER — CLONAZEPAM 0.5 MG PO TABS: 0.5000 mg | ORAL_TABLET | Freq: Every day | ORAL | Status: DC | PRN

## 2016-02-01 MED ORDER — FLUOXETINE HCL 40 MG PO CAPS: 40.0000 mg | ORAL_CAPSULE | Freq: Every day | ORAL | Status: DC
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
## 2016-02-09 ENCOUNTER — Ambulatory Visit (INDEPENDENT_AMBULATORY_CARE_PROVIDER_SITE_OTHER): Payer: BLUE CROSS/BLUE SHIELD | Admitting: Psychiatry

## 2016-02-09 DIAGNOSIS — F331 Major depressive disorder, recurrent, moderate: Secondary | ICD-10-CM

## 2016-02-09 DIAGNOSIS — F4310 Post-traumatic stress disorder, unspecified: Secondary | ICD-10-CM | POA: Diagnosis not present
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
## 2016-02-09 NOTE — Patient Instructions (Signed)
Discussed orally 

## 2016-02-23 ENCOUNTER — Encounter (HOSPITAL_COMMUNITY): Payer: Self-pay | Admitting: Psychiatry

## 2016-02-23 ENCOUNTER — Ambulatory Visit (INDEPENDENT_AMBULATORY_CARE_PROVIDER_SITE_OTHER): Payer: BLUE CROSS/BLUE SHIELD | Admitting: Psychiatry

## 2016-02-23 DIAGNOSIS — F4310 Post-traumatic stress disorder, unspecified: Secondary | ICD-10-CM | POA: Diagnosis not present

## 2016-02-23 DIAGNOSIS — F331 Major depressive disorder, recurrent, moderate: Secondary | ICD-10-CM

## 2016-02-23 NOTE — Progress Notes (Signed)
THERAPIST PROGRESS NOTE  Session Time:  Tuesday 02/23/2016 3:10 PM - 4:07 PM     Participation Level: Active    Behavioral Response: CasualAlert/Anxious/Depressed  Type of Therapy: Individual Therapy  Treatment Goals addressed:   1. Learn and implement calming skills.       2. Verbalize an accurate understanding of PTSD, how it develops, and treatment rationale for PTSD.      3. Participate in cognitive processing therapy to process trauma and reduce its impact.       Interventions: CBT and Supportive  Summary: Sandra Glover is a 30 y.o. female who presents with a history of symptoms of depression beginning in 2009 a few days after her wedding when her aunt with whom she was very close died suddenly. She began taking an anti-depressant at that time but discontinued after about 2 1/2 years and reports doing well without the medication for about 1 1/2 years. She began to experience marital stress as husband was not very involved when they had their first child 4 years ago. Patient reports husband left her at the hospital on the night of son's birth and didn't really assist her with parenting responsibilities. He also wouldn't go anywhere with patient and son in public as husband has self-esteem issues and fear of failure per patient's report. Patient reports eventually having an affair with a coworker after husband shut down and shut out patient when she confronted him about their marriage. However, she ended the affair and recommitted to the marriage about 3 years ago. She reports learning last year during the process of buying their first home, husband had been having an affair with his co Advertising account planner-worker. Patient states she felt like she relapsed with depression at that time. She states having feelings of worthlessness, not wanting to get out of bed, no interest in make-up or doing her hair, and difficulty concentrating at work. She also reports periods of screaming, yelling, and throwing  items feeling as though she is losing a grip. Patient and husband currently are working on their marriage and have started marital counseling with their pastor's wife. Husband also is receiving individual therapy. Patient continues to have trust issues dating back to husband cheating on her when they were dating.    The patient reports continued stress, anxiety, and increased depressed mood since last session. She reports husband has become more depressed as he has been rejected for several jobs in the past few weeks. This has had an adverse effect on his relationship with patient and the family. Patient reports being so distressed by this that she had fleeting passive suicidal ideations this past weekend. She denies having any since that time. She also denies having any desire to cut since last session. She also reports husband's interaction with family especially their son has improved in the past couple of days. Patient continues to experience intrusive memories, flashbacks, avoidant behaviors and hypervigilance. Nightmares and panic attacks have decreased. Patient reports she has begun to implement calming skills. She also has begun to reach out more to her support system, mainly her mother which has been helpful per patient's report.   Suicidal/Homicidal: No. Patient agrees to call this practice, call 911, or have someone take her to the emergency room should symptoms worsen  Therapist Response: Therapist works with patient to review symptoms, administer and discuss responses to the PLC-5 weekly, identify strengths, identify supports, develop treatment plan, provide psychoeducation regarding PTSD,  Plan: Patient agrees to return for  an appointment in  1-2 weeks. Patient agrees to practice relaxation breathing 5-10 minutes 2 times per day. Patient also is scheduled to see psychiatrist Dr. Tenny Craw next week.  Diagnosis: Axis I: MDD    Axis II: No diagnosis    Quisha Mabie, LCSW 02/23/2016

## 2016-02-23 NOTE — Patient Instructions (Signed)
Discussed orally 

## 2016-02-24 ENCOUNTER — Encounter (HOSPITAL_COMMUNITY): Payer: Self-pay | Admitting: Psychiatry

## 2016-02-24 ENCOUNTER — Ambulatory Visit (INDEPENDENT_AMBULATORY_CARE_PROVIDER_SITE_OTHER): Payer: BLUE CROSS/BLUE SHIELD | Admitting: Psychiatry

## 2016-02-24 ENCOUNTER — Encounter (HOSPITAL_COMMUNITY): Payer: Self-pay | Admitting: *Deleted

## 2016-02-24 VITALS — BP 115/74 | HR 61 | Ht 63.0 in | Wt 151.4 lb

## 2016-02-24 DIAGNOSIS — F4310 Post-traumatic stress disorder, unspecified: Secondary | ICD-10-CM | POA: Diagnosis not present

## 2016-02-24 DIAGNOSIS — F331 Major depressive disorder, recurrent, moderate: Secondary | ICD-10-CM

## 2016-02-24 MED ORDER — CLONAZEPAM 1 MG PO TABS: 1.0000 mg | ORAL_TABLET | Freq: Two times a day (BID) | ORAL | Status: DC | PRN

## 2016-02-24 MED ORDER — FLUOXETINE HCL 40 MG PO CAPS: 40.0000 mg | ORAL_CAPSULE | Freq: Every day | ORAL | Status: DC

## 2016-02-24 MED ORDER — TRAZODONE HCL 50 MG PO TABS: 50.0000 mg | ORAL_TABLET | Freq: Every day | ORAL | Status: DC

## 2016-02-24 MED ORDER — ARIPIPRAZOLE 2 MG PO TABS: 2.0000 mg | ORAL_TABLET | Freq: Every day | ORAL | Status: DC
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
## 2016-02-29 ENCOUNTER — Telehealth: Payer: Self-pay | Admitting: Gastroenterology

## 2016-02-29 NOTE — Telephone Encounter (Signed)
I called the pt and she said she has not had a BM since 02/19/2016. She said it is normal for her to go 3-4 days with no BM because of her IBS.  She said she is having pain in her lower mid abdomen and rates it a 7 now. We have not seen her in a couple of years and I told her we could not make any recommendations. But she should either see her PCP or go to the ED if pain worsens. She is scheduling a future appt with Dr. Darrick PennaFields but aware she needs to see PCP or ED now.  I spoke to Gerrit HallsAnna Sams, NP and she agrees.

## 2016-02-29 NOTE — Telephone Encounter (Signed)
Pt called asking to speak with SF or nurse. She said that she has IBS-Constipation and hasn't had a BM in one week. She didn't know if she needed to go to the ED or make OV with us. She wanted to speak with the nurse before scheduling OV. [phone] or [phone]

## 2016-03-01 NOTE — Telephone Encounter (Signed)
Agree 

## 2016-03-08 ENCOUNTER — Ambulatory Visit (INDEPENDENT_AMBULATORY_CARE_PROVIDER_SITE_OTHER): Payer: No Typology Code available for payment source | Admitting: Psychiatry

## 2016-03-08 ENCOUNTER — Encounter (HOSPITAL_COMMUNITY): Payer: Self-pay | Admitting: Psychiatry

## 2016-03-08 ENCOUNTER — Encounter (HOSPITAL_COMMUNITY): Payer: Self-pay | Admitting: *Deleted

## 2016-03-08 DIAGNOSIS — F4310 Post-traumatic stress disorder, unspecified: Secondary | ICD-10-CM

## 2016-03-08 DIAGNOSIS — F331 Major depressive disorder, recurrent, moderate: Secondary | ICD-10-CM | POA: Diagnosis not present

## 2016-03-08 NOTE — Progress Notes (Signed)
THERAPIST PROGRESS NOTE  Session Time:  Tuesday 03/08/2016 3:00 PM - 3 :57 PM    Participation Level: Active    Behavioral Response: CasualAlert/Anxious/Depressed  Type of Therapy: Individual Therapy  Treatment Goals addressed:   1. Learn and implement calming skills.       2. Verbalize an accurate understanding of PTSD, how it develops, and treatment rationale for PTSD.      3. Participate in cognitive processing therapy to process trauma and reduce its impact.       Interventions: CBT and Supportive  Summary: Sandra Glover is a 30 y.o. female who presents with a history of symptoms of depression beginning in 2009 a few days after her wedding when her aunt with whom she was very close died suddenly. She began taking an anti-depressant at that time but discontinued after about 2 1/2 years and reports doing well without the medication for about 1 1/2 years. She began to experience marital stress as husband was not very involved when they had their first child 4 years ago. Patient reports husband left her at the hospital on the night of son's birth and didn't really assist her with parenting responsibilities. He also wouldn't go anywhere with patient and son in public as husband has self-esteem issues and fear of failure per patient's report. Patient reports eventually having an affair with a coworker after husband shut down and shut out patient when she confronted him about their marriage. However, she ended the affair and recommitted to the marriage about 3 years ago. She reports learning last year during the process of buying their first home, husband had been having an affair with his co Advertising account planner-worker. Patient states she felt like she relapsed with depression at that time. She states having feelings of worthlessness, not wanting to get out of bed, no interest in make-up or doing her hair, and difficulty concentrating at work. She also reports periods of screaming, yelling, and throwing  items feeling as though she is losing a grip. Patient and husband currently are working on their marriage and have started marital counseling with their pastor's wife. Husband also is receiving individual therapy. Patient continues to have trust issues dating back to husband cheating on her when they were dating.    The patient reports feeling better since last session and states feeling less depressed since taking Abilify with Prozac as instructed by psychiatrist Dr. Tenny Crawoss. She also reports improved sleep pattern and decreased nightmares since taking trazodone as instructed by Dr. Tenny Crawoss. She continues to experience stress regarding the relationship with her husband and reports having fleeting suicidal ideations and a desire to cut about 2 weeks ago. She has expressed her concerns to husband who has agreed he will seek professional help. She reports continued panic attacks, intrusive memories and flashbacks of traumatic event. Her responses to the PLC-5 weekly indicate symptoms have decreased in duration and intensity. Patient continues to have strong support from her mother.     Suicidal/Homicidal: No. Patient agrees to call this practice, call 911, or have someone take her to the emergency room should symptoms worsen  Therapist Response: Therapist works with patient to review symptoms, administer and discuss responses to the PLC-5 weekly, provide an overview of PTSD symptoms and a cognitive explanation of the development and maintenance of PTSD, discussed rationale for treatment including the use of cognitive restructuring to alleviate stuck points, patient provided a brief description of the traumatic event, patient was given a practice assignment  to write a 1 page impact statement describing the impact her traumatic experience on her thoughts and beliefs about herself, others, and the world  Plan: Patient agrees to return for an appointment in  1-2 weeks. Patient agrees to write Impact  Statement  Diagnosis: Axis I: MDD/PTSD    Axis II: No diagnosis    Kodah Maret, LCSW 03/08/2016

## 2016-03-08 NOTE — Patient Instructions (Signed)
Discussed orally 

## 2016-03-22 ENCOUNTER — Encounter (HOSPITAL_COMMUNITY): Payer: Self-pay | Admitting: Psychiatry

## 2016-03-22 ENCOUNTER — Encounter (HOSPITAL_COMMUNITY): Payer: Self-pay | Admitting: *Deleted

## 2016-03-22 ENCOUNTER — Ambulatory Visit (INDEPENDENT_AMBULATORY_CARE_PROVIDER_SITE_OTHER): Payer: BLUE CROSS/BLUE SHIELD | Admitting: Psychiatry

## 2016-03-22 DIAGNOSIS — F331 Major depressive disorder, recurrent, moderate: Secondary | ICD-10-CM

## 2016-03-22 DIAGNOSIS — F4310 Post-traumatic stress disorder, unspecified: Secondary | ICD-10-CM

## 2016-03-22 NOTE — Progress Notes (Signed)
THERAPIST PROGRESS NOTE  Session Time:  Tuesday 03/22/2016 9:15 AM - 10:10 AM     Participation Level: Active    Behavioral Response: CasualAlert/Anxious/Depressed  Type of Therapy: Individual Therapy  Treatment Goals addressed:   1. Learn and implement calming skills.       2. Verbalize an accurate understanding of PTSD, how it develops, and treatment rationale for PTSD.      3. Participate in cognitive processing therapy to process trauma and reduce its impact.       Interventions: CBT and Supportive  Summary: Sandra Glover is a 29 y.o. female who presents with a history of symptoms of depression beginning in 2009 a few days after her wedding when her aunt with whom she was very close died suddenly. She began taking an anti-depressant at that time but discontinued after about 2 1/2 years and reports doing well without the medication for about 1 1/2 years. She began to experience marital stress as husband was not very involved when they had their first child 4 years ago. Patient reports husband left her at the hospital on the night of son's birth and didn't really assist her with parenting responsibilities. He also wouldn't go anywhere with patient and son in public as husband has self-esteem issues and fear of failure per patient's report. Patient reports eventually having an affair with a coworker after husband shut down and shut out patient when she confronted him about their marriage. However, she ended the affair and recommitted to the marriage about 3 years ago. She reports learning last year during the process of buying their first home, husband had been having an affair with his co Advertising account planner. Patient states she felt like she relapsed with depression at that time. She states having feelings of worthlessness, not wanting to get out of bed, no interest in make-up or doing her hair, and difficulty concentrating at work. She also reports periods of screaming, yelling, and  throwing items feeling as though she is losing a grip. Patient and husband currently are working on their marriage and have started marital counseling with their pastor's wife. Husband also is receiving individual therapy. Patient continues to have trust issues dating back to husband cheating on her when they were dating.    The patient reports increased stress, anxiety, panic attacks, and intrusive memories since last session. Increased symptoms were triggered by patient learning over the weekend her best friend's 42-month-old stepson died after hitting his head when he fell in the shower after he was left unattended. Her friend now has been charged with negligent homicide. Patient used healthy coping skills by using her support system. She talked with her mother and her husband to express her feelings and her concerns. Both were very supportive and understanding per patient's report. She also used controlled breathing technique which also was helpful. Patient denies any suicidal ideations and any desire to cut since last session.    Suicidal/Homicidal: No.   Therapist Response: Reviewed symptoms, praise and reinforced patient's use of healthy coping skills, administered and discussed responses to the PLC-5 weekly, patient read impact statement, therapist and patient discussed the meaning of trauma, began to identify stuck points and problematic areas, and added to  stuck points log, discussed the relationship among thoughts/feelings/and behaviors, used an example from patient's discussion about the impact of trauma on her life to illustrate the cognitive model, introduced and assigned the patient A-B-C Worksheets daily to monitor her thoughts, feelings, and behaviors until  the next session.  Plan: Patient agrees to return for an appointment in  1-2 weeks. Patient agrees to complete A-B-C worksheets daily  Diagnosis: Axis I: MDD/PTSD    Axis II: No diagnosis    Namon Villarin, LCSW 03/22/2016

## 2016-03-22 NOTE — Patient Instructions (Signed)
Discussed orally 

## 2016-03-23 ENCOUNTER — Ambulatory Visit (HOSPITAL_COMMUNITY): Payer: Self-pay | Admitting: Psychiatry

## 2016-03-24 ENCOUNTER — Other Ambulatory Visit: Payer: Self-pay

## 2016-03-24 ENCOUNTER — Ambulatory Visit (INDEPENDENT_AMBULATORY_CARE_PROVIDER_SITE_OTHER): Payer: BLUE CROSS/BLUE SHIELD | Admitting: Gastroenterology

## 2016-03-24 ENCOUNTER — Encounter: Payer: Self-pay | Admitting: Gastroenterology

## 2016-03-24 VITALS — BP 122/82 | HR 77 | Temp 99.4°F | Ht 63.0 in | Wt 153.0 lb

## 2016-03-24 DIAGNOSIS — K5901 Slow transit constipation: Secondary | ICD-10-CM | POA: Diagnosis not present

## 2016-03-24 DIAGNOSIS — K625 Hemorrhage of anus and rectum: Secondary | ICD-10-CM | POA: Diagnosis not present

## 2016-03-24 DIAGNOSIS — K5900 Constipation, unspecified: Secondary | ICD-10-CM | POA: Insufficient documentation

## 2016-03-24 MED ORDER — LIDOCAINE-HYDROCORTISONE ACE 3-2.5 % RE KIT: PACK | RECTAL | Status: DC

## 2016-03-24 MED ORDER — LINACLOTIDE 145 MCG PO CAPS: ORAL_CAPSULE | ORAL | Status: DC

## 2016-03-24 NOTE — Progress Notes (Signed)
CC'ED TO PCP 

## 2016-03-24 NOTE — Assessment & Plan Note (Signed)
SYMPTOMS NOT IDEALLY CONTROLLED ON MIRALAX.  DRINK WATER TO KEEP YOUR URINE LIGHT YELLOW. FOLLOW A HIGH FIBER DIET. AVOID ITEMS THAT CAUSE BLOATING & GAS.  ADD LINZESS 30 MINS PRIOR TO BREAKFAST. IT MAY CAUSE EXPLOSIVE DIARRHEA. FOLLOW UP IN 4 MOS.

## 2016-03-24 NOTE — Patient Instructions (Addendum)
DRINK WATER TO KEEP YOUR URINE LIGHT YELLOW.  FOLLOW A HIGH FIBER DIET. AVOID ITEMS THAT CAUSE BLOATING & GAS.   ADD LINZESS 30 MINS PRIOR TO BREAKFAST. IT MAY CAUSE EXPLOSIVE DIARRHEA.   USE APOTHECARY HEMORRHOID CREAM  FOUR TIMES  A DAY FOR 2 WEEKS TO RELIEVE RECTAL PAIN/PRESSURE/BLEEDING.  WE WILL SCHEDULE YOUR COLONOSCOPY/POSSIBLE HEMORRHOID BANDING IN 2-3 WEEKS WITH PROPOFOL.   ON DAY PRIOR TO COLONOSCOPY, FULL LIQUID DIET(SEE INFO BELOW) AND DRINK PLENTY OF LIQUIDS TO HELP FLUSH OUT YOUR BOWELS AND AS WELL TAKE MOVIPREP.  TWO  HOUR AS AFTER STARTING MOVIPREP, TAKE DULCOLAX 10 MG PO AND REPEAT 4 HOURS AFTER MOVIPREP. NO NEED FOR AN ENEMA IF HEMORRHOID PAIN IS STILL PRESENT.  FOLLOW UP IN 4 MOS.    HEMORRHOIDAL BANDING COMPLICATIONS:  COMMON: 1. MINOR PAIN  UNCOMMON: 1. ABSCESS 2. BAND FALLS OFF 3. PROLAPSE OF HEMORRHOIDS AND PAIN 4. ULCER BLEEDING  A. USUALLY SELF-LIMITED: MAY LAST 3-5 DAYS  B. MAY REQUIRE INTERVENTION: 1-2 WEEKS AFTER INTERACTIONS 5. NECROTIZING PELVIC SEPSIS  A. SYMPTOMS: FEVER, PAIN, DIFFICULTY URINATING  Full Liquid Diet A high-calorie, high-protein supplement should be used to meet your nutritional requirements when the full liquid diet is continued for more than 2 or 3 days. If this diet is to be used for an extended period of time (more than 7 days), a multivitamin should be considered.  Breads and Starches  Allowed: None are allowed except crackers pureed (made into a thick, smooth soup) in soup.   Avoid: Any others.    Potatoes/Pasta/Rice  Allowed: ANY ITEM AS A SOUP OR SMALL PLATE OF MASHED POTATOES OR SCRAMBLED EGGS.       Vegetables  Allowed: Strained tomato or vegetable juice. Vegetables pureed in soup.   Avoid: Any others.    Fruit  Allowed: Any strained fruit juices and fruit drinks. Include 1 serving of citrus or vitamin C-enriched fruit juice daily.   Avoid: Any others.  Meat and Meat Substitutes  Allowed:  Egg  Avoid: Any meat, fish, or fowl. All cheese.  Milk  Allowed: SOY Milk beverages, including milk shakes and instant breakfast mixes. Smooth yogurt.   Avoid: Any others. Avoid dairy products if not tolerated.    Soups and Combination Foods  Allowed: Broth, strained cream soups. Strained, broth-based soups.   Avoid: Any others.    Desserts and Sweets  Allowed: flavored gelatin, tapioca, ice cream, sherbet, smooth pudding, junket, fruit ices, frozen ice pops, pudding pops, frozen fudge pops, chocolate syrup. Sugar, honey, jelly, syrup.   Avoid: Any others.  Fats and Oils  Allowed: Margarine, butter, cream, sour cream, oils.   Avoid: Any others.  Beverages  Allowed: All.   Avoid: None.  Condiments  Allowed: Iodized salt, pepper, spices, flavorings. Cocoa powder.   Avoid: Any others.    SAMPLE MEAL PLAN Breakfast   cup orange juice.   1 OR 2 EGGS  1 cup milk.   1 cup beverage (coffee or tea).   Cream or sugar, if desired.    Midmorning Snack  2 SCRAMBLED OR HARD BOILED EGG   Lunch  1 cup cream soup.    cup fruit juice.   1 cup milk.    cup custard.   1 cup beverage (coffee or tea).   Cream or sugar, if desired.    Midafternoon Snack  1 cup milk shake.  Dinner  1 cup cream soup.    cup fruit juice.   1 cup MILK  cup pudding.   1 cup beverage (coffee or tea).   Cream or sugar, if desired.  Evening Snack  1 cup supplement.  To increase calories, add sugar, cream, butter, or margarine if possible. Nutritional supplements will also increase the total calories.

## 2016-03-24 NOTE — Progress Notes (Signed)
ON RECALL  °

## 2016-03-24 NOTE — Assessment & Plan Note (Addendum)
ASSOCIATED WITH RECTAL PAIN AND MOST LIKELY DUE TO HEMORRHOIDS. DIFFERENTIAL DIAGNOSIS INCLUDES COLON POLYPS, AVMs, & LESS LIKELY COLON CA.  DRINK WATER TO KEEP YOUR URINE LIGHT YELLOW. FOLLOW A HIGH FIBER DIET. AVOID ITEMS THAT CAUSE BLOATING & GAS.  ADD LINZESS 30 MINS PRIOR TO BREAKFAST. IT MAY CAUSE EXPLOSIVE DIARRHEA. USE APOTHECARY HEMORRHOID CREAM  FOUR TIMES  A DAY FOR 2 WEEKS TO RELIEVE RECTAL PAIN/PRESSURE/BLEEDING.  SCHEDULE COLONOSCOPY/POSSIBLE HEMORRHOID BANDING IN 2-3 WEEKS. TCS WITH MAC DUE TO POLYPHARMACY. ON DAY PRIOR TO COLONOSCOPY, FULL LIQUID DIET(SEE INFO BELOW) AND DRINK PLENTY OF LIQUIDS TO HELP FLUSH OUT YOUR BOWELS AND AS WELL TAKE MOVIPREP.  TWO  HOUR AS AFTER STARTING MOVIPREP, TAKE DULCOLAX 10 MG PO AND REPEAT 4 HOURS AFTER MOVIPREP. NO NEED FOR AN ENEMA IF HEMORRHOID PAIN IS STILL PRESENT. DISCUSSED PROCEDURE, BENEFITS, & RISKS: < 1% chance of medication reaction, bleeding, perforation, PELVIC VEIN SEPSIS, or rupture of spleen/liver. FOLLOW UP IN 4 MOS.

## 2016-03-24 NOTE — Progress Notes (Signed)
Subjective:    Patient ID: Sandra Glover, female    DOB: [birth year], 30 y.o.   MRN: [id number]  Aniceto BossSETTLE,PAUL C, MD  HPI TROUBLE WITH IBS-C AND USING MIRALAX PRN TO RELIEVE CONSTIPATION. BMS: Q5-6 DAYS AND WITH MIRALAX QOD. NEVER FEELS LIKE SHE COMPLETELY EMPTIES HER RECTUM. PAIN IN HEMORRHOIDS. RARE ABDOMINAL PAIN IN LOWER ABDOMEN(SHARP). BETTER WITH BMs.SEE BRBPR 1-2X/WEEK FOR 5 YEARS. WEIGHT LOSS: NO. APPETITE: GOOD. NEVER BEEN REGULAR. TROUBLE MOVING BOWEL SINCE SHE WAS A CHILD.HEMORRHOID SYMPTOMS: PRESSURE: DAILY, PAIN, ITCHING: DAILY, BURNING: DAILY, SOILING: WITH EVERY BM. TRIED WITCH HAZEL PADS AND PREP H WHEN ITS REALLY BAD.   PT DENIES FEVER, CHILLS, HEMATEMESIS, nausea, vomiting, melena, diarrhea, CHEST PAIN, SHORTNESS OF BREATH, CHANGE IN BOWEL IN HABITS,  problems swallowing, problems with sedation, OR heartburn or indigestion.  Past Medical History  Diagnosis Date  . History of anemia 2013  . Sinus infection 01/25/2013    started antibiotic 01/24/2013 x 10 days; nasal congestion, clear drainage from nose  . History of depression     states stopped med. in 2013  . Tonsillar and adenoid hypertrophy 01/2013  . 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 Vaginal irritation 03/25/2014    Mild yeast but has history of yeast, will not treat til after first trimester,try yogurt  . Gestational diabetes mellitus, antepartum   . Pertussis   . Postcoital bleeding 07/14/2015    Past Surgical History  Procedure Laterality Date  . Cholecystectomy  04/23/2010    laparoscopic  . Partial nephrectomy Right age 30 mos.  . Tonsillectomy and adenoidectomy N/A 01/29/2013    Procedure: TONSILLECTOMY AND ADENOIDECTOMY;  Surgeon: Darletta MollSui W Teoh, MD;  Location: [facility];  Service: ENT;  Laterality: N/A;    Allergies  Allergen Reactions  . Lortab [Hydrocodone-Acetaminophen] Nausea And Vomiting  . Morphine And Related Rash    Current  Outpatient Prescriptions  Medication Sig Dispense Refill  . ARIPiprazole (ABILIFY) 2 MG tablet Take 1 tablet (2 mg total) by mouth daily.    . clonazePAM (KLONOPIN) 1 MG tablet Take 1 tablet (1 mg total) by mouth 2 (two) times daily as needed for anxiety.    Marland Kitchen. etonogestrel (NEXPLANON) 68 MG IMPL implant 1 each by Subdermal route once.    Marland Kitchen. FLUoxetine (PROZAC) 40 MG capsule Take 1 capsule (40 mg total) by mouth daily.    Marland Kitchen. ibuprofen (ADVIL,MOTRIN) 800 MG tablet Take 1 tablet (800 mg total) by mouth 3 (three) times daily.    . traZODone (DESYREL) 50 MG tablet Take 1 tablet (50 mg total) by mouth at bedtime.     Family History  Problem Relation Age of Onset  . COPD Mother   . Depression Mother   . Anxiety disorder Mother   . Ovarian cancer Maternal Aunt   . Ovarian cancer Maternal Grandmother   . Bipolar disorder Paternal Uncle   . Anxiety disorder Cousin   . Drug abuse Cousin   . Bipolar disorder Cousin    Social History   Social History  . Marital Status: Married    Spouse Name: N/A  . Number of Children: 2 CHILDREN: AGE 59 AND 19 MOS  . Years of Education: N/A   Social History Main Topics  . Smoking status: Never Smoker   . Smokeless tobacco: Never Used     Comment: Never smoked  . Alcohol Use: No     Comment: socially; not now  .  Drug Use: No  . 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    Objective:   Physical Exam  Constitutional: She is oriented to person, place, and time. She appears well-developed and well-nourished. No distress.  HENT:  Head: Normocephalic and atraumatic.  Mouth/Throat: Oropharynx is clear and moist. No oropharyngeal exudate.  Eyes: Pupils are equal, round, and reactive to light. No scleral icterus.  Neck: Normal range of motion. Neck supple.  Cardiovascular:  Normal rate, regular rhythm and normal heart sounds.   Pulmonary/Chest: Effort normal and breath sounds normal. No respiratory distress.  Abdominal: Soft. Bowel sounds are normal. She exhibits no distension. There is no tenderness.  Genitourinary: Rectal exam shows internal hemorrhoid (grade III-ONE PINK AND ONE BLUE, POSSIBLE SLIGHT ULCERATION ON PINK IH).     Musculoskeletal: She exhibits no edema.  Lymphadenopathy:    She has no cervical adenopathy.  Neurological: She is alert and oriented to person, place, and time.  NO FOCAL DEFICITS  Psychiatric:  FLAT AFFECT, NL MOOD  Vitals reviewed.     Assessment & Plan:

## 2016-03-31 ENCOUNTER — Ambulatory Visit (HOSPITAL_COMMUNITY): Payer: Self-pay | Admitting: Psychiatry

## 2016-04-04 ENCOUNTER — Ambulatory Visit (INDEPENDENT_AMBULATORY_CARE_PROVIDER_SITE_OTHER): Payer: BLUE CROSS/BLUE SHIELD | Admitting: Psychiatry

## 2016-04-04 ENCOUNTER — Encounter (HOSPITAL_COMMUNITY): Payer: Self-pay | Admitting: Psychiatry

## 2016-04-04 VITALS — BP 123/80 | HR 70 | Ht 63.0 in | Wt 155.6 lb

## 2016-04-04 DIAGNOSIS — F331 Major depressive disorder, recurrent, moderate: Secondary | ICD-10-CM

## 2016-04-04 MED ORDER — FLUOXETINE HCL 40 MG PO CAPS
40.0000 mg | ORAL_CAPSULE | Freq: Every day | ORAL | Status: DC
Start: 2016-04-04 — End: 2016-04-04

## 2016-04-04 MED ORDER — CLONAZEPAM 1 MG PO TABS: 1.0000 mg | ORAL_TABLET | Freq: Two times a day (BID) | ORAL | Status: DC | PRN

## 2016-04-04 MED ORDER — TRAZODONE HCL 50 MG PO TABS: 50.0000 mg | ORAL_TABLET | Freq: Every day | ORAL | Status: DC

## 2016-04-04 MED ORDER — FLUOXETINE HCL 40 MG PO CAPS: 40.0000 mg | ORAL_CAPSULE | Freq: Every day | ORAL | Status: DC

## 2016-04-04 MED ORDER — ARIPIPRAZOLE 2 MG PO TABS: 2.0000 mg | ORAL_TABLET | Freq: Every day | ORAL | Status: DC
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
## 2016-04-05 ENCOUNTER — Ambulatory Visit (INDEPENDENT_AMBULATORY_CARE_PROVIDER_SITE_OTHER): Payer: BLUE CROSS/BLUE SHIELD | Admitting: Psychiatry

## 2016-04-05 ENCOUNTER — Encounter (HOSPITAL_COMMUNITY): Payer: Self-pay | Admitting: Psychiatry

## 2016-04-05 ENCOUNTER — Ambulatory Visit (HOSPITAL_COMMUNITY): Payer: Self-pay | Admitting: Psychiatry

## 2016-04-05 DIAGNOSIS — F4310 Post-traumatic stress disorder, unspecified: Secondary | ICD-10-CM | POA: Diagnosis not present

## 2016-04-05 DIAGNOSIS — F331 Major depressive disorder, recurrent, moderate: Secondary | ICD-10-CM

## 2016-04-05 NOTE — Patient Instructions (Signed)
Discussed orally 

## 2016-04-05 NOTE — Progress Notes (Signed)
        THERAPIST PROGRESS NOTE  Session Time:  Tuesday 04/05/2016 10:27 AM - 11:20 AM     Participation Level: Active    Behavioral Response: CasualAlert/Anxious/Depressed  Type of Therapy: Individual Therapy  Treatment Goals addressed:   1. Learn and implement calming skills.       2. Verbalize an accurate understanding of PTSD, how it develops, and treatment rationale for PTSD.      3. Participate in cognitive processing therapy to process trauma and reduce its impact.       Interventions: CBT and Supportive  Summary: Sandra HomansJessica H Humphries is a 30 y.o. female who presents with a history of symptoms of depression beginning in 2009 a few days after her wedding when her aunt with whom she was very close died suddenly. She began taking an anti-depressant at that time but discontinued after about 2 1/2 years and reports doing well without the medication for about 1 1/2 years. She began to experience marital stress as husband was not very involved when they had their first child 4 years ago. Patient reports husband left her at the hospital on the night of son's birth and didn't really assist her with parenting responsibilities. He also wouldn't go anywhere with patient and son in public as husband has self-esteem issues and fear of failure per patient's report. Patient reports eventually having an affair with a coworker after husband shut down and shut out patient when she confronted him about their marriage. However, she ended the affair and recommitted to the marriage about 3 years ago. She reports learning last year during the process of buying their first home, husband had been having an affair with his co Advertising account planner-worker. Patient states she felt like she relapsed with depression at that time. She states having feelings of worthlessness, not wanting to get out of bed, no interest in make-up or doing her hair, and difficulty concentrating at work. She also reports periods of screaming, yelling, and  throwing items feeling as though she is losing a grip. Patient and husband currently are working on their marriage and have started marital counseling with their pastor's wife. Husband also is receiving individual therapy. Patient continues to have trust issues dating back to husband cheating on her when they were dating.    The patient reports decreased feelings of guilt, anxiety, panic attacks, and intrusive memories since last session. She remains protective of her children She continues to express sadness about her friend. She also reports concerns about her son's behavior but is thankful he has begun receiving treatment in this office. She also reports less stress regarding marriage as husband has quit his job and has begun looking for another job. Husband has been more supportive and positive. Patient does express anxiety regarding financial issues.     Suicidal/Homicidal: No.   Therapist Response: Reviewed symptoms, praise and reinforced patient's use of healthy coping skills, administered and discussed responses to the PLC-5 weekly, reviewed patient's completed A-B-C worksheets, discussed stuck points, reviewed the traumatic event and discussed acceptance/self-blame issues, assigned patient to read written account daily, and to continue completing A-B-C worksheets daily.   Plan: Patient agrees to return for an appointment in  1-2 weeks. Patient agrees to complete A-B-C worksheets daily  Diagnosis: Axis I: MDD/PTSD    Axis II: No diagnosis    Vasili Fok, LCSW 04/05/2016

## 2016-04-13 ENCOUNTER — Encounter (HOSPITAL_COMMUNITY): Payer: Self-pay | Admitting: Psychiatry

## 2016-04-13 ENCOUNTER — Ambulatory Visit (INDEPENDENT_AMBULATORY_CARE_PROVIDER_SITE_OTHER): Payer: BLUE CROSS/BLUE SHIELD | Admitting: Psychiatry

## 2016-04-13 DIAGNOSIS — F331 Major depressive disorder, recurrent, moderate: Secondary | ICD-10-CM

## 2016-04-13 DIAGNOSIS — F4310 Post-traumatic stress disorder, unspecified: Secondary | ICD-10-CM | POA: Diagnosis not present

## 2016-04-13 NOTE — Progress Notes (Signed)
        THERAPIST PROGRESS NOTE  Session Time:  Wednesday 04/13/2016 4:02 PM -   4:47 PM  Participation Level: Active    Behavioral Response: CasualAlert/Anxious/Depressed  Type of Therapy: Individual Therapy  Treatment Goals addressed:   1. Learn and implement calming skills.       2. Verbalize an accurate understanding of PTSD, how it develops, and treatment rationale for PTSD.      3. Participate in cognitive processing therapy to process trauma and reduce its impact.       Interventions: CBT and Supportive  Summary: Sandra Glover is a 30 y.o. female who presents with a history of symptoms of depression beginning in 2009 a few days after her wedding when her aunt with whom she was very close died suddenly. She began taking an anti-depressant at that time but discontinued after about 2 1/2 years and reports doing well without the medication for about 1 1/2 years. She began to experience marital stress as husband was not very involved when they had their first child 4 years ago. Patient reports husband left her at the hospital on the night of son's birth and didn't really assist her with parenting responsibilities. He also wouldn't go anywhere with patient and son in public as husband has self-esteem issues and fear of failure per patient's report. Patient reports eventually having an affair with a coworker after husband shut down and shut out patient when she confronted him about their marriage. However, she ended the affair and recommitted to the marriage about 3 years ago. She reports learning last year during the process of buying their first home, husband had been having an affair with his co Advertising account planner-worker. Patient states she felt like she relapsed with depression at that time. She states having feelings of worthlessness, not wanting to get out of bed, no interest in make-up or doing her hair, and difficulty concentrating at work. She also reports periods of screaming, yelling, and  throwing items feeling as though she is losing a grip. Patient and husband currently are working on their marriage and have started marital counseling with their pastor's wife. Husband also is receiving individual therapy. Patient continues to have trust issues dating back to husband cheating on her when they were dating.    The patient reports continued decreased feelings of guilt, anxiety, panic attacks, and intrusive memories since last session.  She has been reading trauma account daily and reports decreased anxiety and fear when reading the account but also allowing herself to experience her emotions while reading the account. She has successfully being using relaxation techniques to manage anxiety. She remains protective of her children and continues to have stuck points related to their future and safety. She reports improvement in marriage. and is looking forward to celebrating Mother's Day with husband.   Suicidal/Homicidal: No.   Therapist Response: Reviewed symptoms, praise and reinforced patient's use of healthy coping skills, administered and discussed responses to the PLC-5 weekly, reviewed patient's completed A-B-C worksheets, assisted patient identify stuck points,  introduced challenging questions worksheet and practiced completing worksheet to address one of patient's stuck points, assigned patient to read written account daily and to use challenging questions to address stuck points.  Plan: Patient agrees to return for an appointment in  2-3 weeks.  Diagnosis: Axis I: MDD/PTSD    Axis II: No diagnosis    Sandra Kemler, LCSW 04/13/2016

## 2016-04-13 NOTE — Patient Instructions (Signed)
Discussed orally 

## 2016-04-15 ENCOUNTER — Ambulatory Visit (HOSPITAL_COMMUNITY): Payer: Self-pay | Admitting: Psychiatry

## 2016-04-19 ENCOUNTER — Ambulatory Visit (HOSPITAL_COMMUNITY): Payer: Self-pay | Admitting: Psychiatry

## 2016-05-03 ENCOUNTER — Encounter (HOSPITAL_COMMUNITY): Payer: Self-pay | Admitting: Psychiatry

## 2016-05-03 ENCOUNTER — Ambulatory Visit (INDEPENDENT_AMBULATORY_CARE_PROVIDER_SITE_OTHER): Payer: BLUE CROSS/BLUE SHIELD | Admitting: Psychiatry

## 2016-05-03 DIAGNOSIS — F331 Major depressive disorder, recurrent, moderate: Secondary | ICD-10-CM

## 2016-05-03 DIAGNOSIS — F4310 Post-traumatic stress disorder, unspecified: Secondary | ICD-10-CM | POA: Diagnosis not present

## 2016-05-03 NOTE — Patient Instructions (Signed)
Discussed orally 

## 2016-05-03 NOTE — Progress Notes (Signed)
THERAPIST PROGRESS NOTE  Session Time:  Tuesday 05/03/2016 -10:06 AM -  10:43 AM     Participation Level: Active    Behavioral Response: CasualAlert/Sad  Type of Therapy: Individual Therapy  Treatment Goals addressed:   1. Learn and implement calming skills.       2. Verbalize an accurate understanding of PTSD, how it develops, and treatment rationale for PTSD.      3. Participate in cognitive processing therapy to process trauma and reduce its impact.       Interventions: CBT and Supportive  Summary: Sandra Glover is a 30 y.o. female who presents with a history of symptoms of depression beginning in 2009 a few days after her wedding when her aunt with whom she was very close died suddenly. She began taking an anti-depressant at that time but discontinued after about 2 1/2 years and reports doing well without the medication for about 1 1/2 years. She began to experience marital stress as husband was not very involved when they had their first child 4 years ago. Patient reports husband left her at the hospital on the night of son's birth and didn't really assist her with parenting responsibilities. He also wouldn't go anywhere with patient and son in public as husband has self-esteem issues and fear of failure per patient's report. Patient reports eventually having an affair with a coworker after husband shut down and shut out patient when she confronted him about their marriage. However, she ended the affair and recommitted to the marriage about 3 years ago. She reports learning last year during the process of buying their first home, husband had been having an affair with his co Advertising account planner-worker. Patient states she felt like she relapsed with depression at that time. She states having feelings of worthlessness, not wanting to get out of bed, no interest in make-up or doing her hair, and difficulty concentrating at work. She also reports periods of screaming, yelling, and throwing  items feeling as though she is losing a grip. Patient and husband currently are working on their marriage and have started marital counseling with their pastor's wife. Husband also is receiving individual therapy. Patient continues to have trust issues dating back to husband cheating on her when they were dating.    The patient reports doing very well since last session. She denies any incidents of reexperiencing, hypervigilance, or avoidant behaviors in the past week. She has gone places with her children and doing activities with them without anxiety or being overprotective. She also has gone to the parking lot several times where the traumatic event occurred without having the anxiety response. She forgot to bring homework but successfully has addressed each of her stuck points. She cites example of the most prevalent stuck point of something happening to her children being resolved. She states feeling accomplished regarding her progress and says has been feeling really good in the past few weeks. She reports continued improvement in her marriage. She does express sadness today regarding a co-worker dying suddenly yesterday.    Suicidal/Homicidal: No.   Therapist Response: Reviewed symptoms, praised and reinforced patient's use of healthy coping skills, discussed stuck points and ways patient addressed with healthy thought patterns, began to process grief/loss issues regarding co-worker and began to discuss stages of grief particularly denial/shock   Plan: Patient agrees to return for an appointment in  2-3 weeks. She agrees to use coping skills discussed in session and review handout on stages of  grief  Diagnosis: Axis I: MDD/PTSD    Axis II: No diagnosis    Isaias Dowson, LCSW 05/03/2016

## 2016-05-05 ENCOUNTER — Other Ambulatory Visit (HOSPITAL_COMMUNITY): Payer: Self-pay

## 2016-05-11 NOTE — Patient Instructions (Signed)
Sandra HomansJessica H Glover  05/11/2016     @PREFPERIOPPHARMACY @   Your procedure is scheduled on 05/17/2016.  Report to [facility] at 8:45 A.M.  Call this number if you have problems the morning of surgery:  [phone]   Remember:  Do not eat food or drink liquids after midnight.  Take these medicines the morning of surgery with A SIP OF WATER Abilify, Klonopin, Prozac   Do not wear jewelry, make-up or nail polish.  Do not wear lotions, powders, or perfumes.  You may wear deodorant.  Do not shave 48 hours prior to surgery.  Men may shave face and neck.  Do not bring valuables to the hospital.  [facility] belongings or valuables.  Contacts, dentures or bridgework may not be worn into surgery.  Leave your suitcase in the car.  After surgery it may be brought to your room.  For patients admitted to the hospital, discharge time will be determined by your treatment team.  Patients discharged the day of surgery will not be allowed to drive home.    Please read over the following fact sheets that you were given. Anesthesia Post-op Instructions     PATIENT INSTRUCTIONS POST-ANESTHESIA  IMMEDIATELY FOLLOWING SURGERY:  Do not drive or operate machinery for the first twenty four hours after surgery.  Do not make any important decisions for twenty four hours after surgery or while taking narcotic pain medications or sedatives.  If you develop intractable nausea and vomiting or a severe headache please notify your doctor immediately.  FOLLOW-UP:  Please make an appointment with your surgeon as instructed. You do not need to follow up with anesthesia unless specifically instructed to do so.  WOUND CARE INSTRUCTIONS (if applicable):  Keep a dry clean dressing on the anesthesia/puncture wound site if there is drainage.  Once the wound has quit draining you may leave it open to air.  Generally you should leave the bandage intact for twenty four hours unless there is  drainage.  If the epidural site drains for more than 36-48 hours please call the anesthesia department.  QUESTIONS?:  Please feel free to call your physician or the hospital operator if you have any questions, and they will be happy to assist you.      Hemorrhoids Hemorrhoids are puffy (swollen) veins around the rectum or anus. Hemorrhoids can cause pain, itching, bleeding, or irritation. HOME CARE  Eat foods with fiber, such as whole grains, beans, nuts, fruits, and vegetables. Ask your doctor about taking products with added fiber in them (fibersupplements).  Drink enough fluid to keep your pee (urine) clear or pale yellow.  Exercise often.  Go to the bathroom when you have the urge to poop. Do not wait.  Avoid straining to poop (bowel movement).  Keep the butt area dry and clean. Use wet toilet paper or moist paper towels.  Medicated creams and medicine inserted into the anus (anal suppository) may be used or applied as told.  Only take medicine as told by your doctor.  Take a warm water bath (sitz bath) for 15-20 minutes to ease pain. Do this 3-4 times a day.  Place ice packs on the area if it is tender or puffy. Use the ice packs between the warm water baths.  Put ice in a plastic bag.  Place a towel between your skin and the bag.  Leave the ice on for 15-20 minutes, 03-04 times a day.  Do not use a donut-shaped  pillow or sit on the toilet for a long time. GET HELP RIGHT AWAY IF:   You have more pain that is not controlled by treatment or medicine.  You have bleeding that will not stop.  You have trouble or are unable to poop (bowel movement).  You have pain or puffiness outside the area of the hemorrhoids. MAKE SURE YOU:   Understand these instructions.  Will watch your condition.  Will get help right away if you are not doing well or get worse.   This information is not intended to replace advice given to you by your health care provider. Make sure you  discuss any questions you have with your health care provider.   Document Released: 08/30/2008 Document Revised: 11/07/2012 Document Reviewed: 10/02/2012 Elsevier Interactive Patient Education 2016 [employer]. Colonoscopy A colonoscopy is an exam to look at the entire large intestine (colon). This exam can help find problems such as tumors, polyps, inflammation, and areas of bleeding. The exam takes about 1 hour.  LET [facility] CARE PROVIDER KNOW ABOUT:   Any allergies you have.  All medicines you are taking, including vitamins, herbs, eye drops, creams, and over-the-counter medicines.  Previous problems you or members of your family have had with the use of anesthetics.  Any blood disorders you have.  Previous surgeries you have had.  Medical conditions you have. RISKS AND COMPLICATIONS  Generally, this is a safe procedure. However, as with any procedure, complications can occur. Possible complications include:  Bleeding.  Tearing or rupture of the colon wall.  Reaction to medicines given during the exam.  Infection (rare). BEFORE THE PROCEDURE   Ask your health care provider about changing or stopping your regular medicines.  You may be prescribed an oral bowel prep. This involves drinking a large amount of medicated liquid, starting the day before your procedure. The liquid will cause you to have multiple loose stools until your stool is almost clear or light green. This cleans out your colon in preparation for the procedure.  Do not eat or drink anything else once you have started the bowel prep, unless your health care provider tells you it is safe to do so.  Arrange for someone to drive you home after the procedure. PROCEDURE   You will be given medicine to help you relax (sedative).  You will lie on your side with your knees bent.  A long, flexible tube with a light and camera on the end (colonoscope) will be inserted through the rectum and into the colon. The  camera sends video back to a computer screen as it moves through the colon. The colonoscope also releases carbon dioxide gas to inflate the colon. This helps your health care provider see the area better.  During the exam, your health care provider may take a small tissue sample (biopsy) to be examined under a microscope if any abnormalities are found.  The exam is finished when the entire colon has been viewed. AFTER THE PROCEDURE   Do not drive for 24 hours after the exam.  You may have a small amount of blood in your stool.  You may pass moderate amounts of gas and have mild abdominal cramping or bloating. This is caused by the gas used to inflate your colon during the exam.  Ask when your test results will be ready and how you will get your results. Make sure you get your test results.   This information is not intended to replace advice given to you  by your health care provider. Make sure you discuss any questions you have with your health care provider.   Document Released: 11/18/2000 Document Revised: 09/11/2013 Document Reviewed: 07/29/2013 Elsevier Interactive Patient Education [employer].

## 2016-05-13 ENCOUNTER — Other Ambulatory Visit (HOSPITAL_COMMUNITY): Payer: Self-pay

## 2016-05-13 ENCOUNTER — Encounter (HOSPITAL_COMMUNITY)
Admission: RE | Admit: 2016-05-13 | Discharge: 2016-05-13 | Disposition: A | Discharge status: Home / Self Care | Payer: BLUE CROSS/BLUE SHIELD | Source: Ambulatory Visit | Attending: Gastroenterology | Admitting: Gastroenterology

## 2016-05-13 ENCOUNTER — Encounter (HOSPITAL_COMMUNITY): Payer: Self-pay

## 2016-05-13 DIAGNOSIS — Z01812 Encounter for preprocedural laboratory examination: Secondary | ICD-10-CM | POA: Insufficient documentation

## 2016-05-13 HISTORY — DX: Major depressive disorder, single episode, unspecified: F32.9

## 2016-05-13 HISTORY — DX: Anxiety disorder, unspecified: F41.9

## 2016-05-13 HISTORY — DX: Depression, unspecified: F32.A

## 2016-05-13 LAB — CBC WITH DIFFERENTIAL/PLATELET
Basophils Absolute: 0 10*3/uL (ref 0.0–0.1)
Basophils Relative: 0 %
Eosinophils Absolute: 0.2 10*3/uL (ref 0.0–0.7)
Eosinophils Relative: 3 %
HCT: 36.7 % (ref 36.0–46.0)
Hemoglobin: 12.6 g/dL (ref 12.0–15.0)
Lymphocytes Relative: 21 %
Lymphs Abs: 1.5 10*3/uL (ref 0.7–4.0)
MCH: 31.2 pg (ref 26.0–34.0)
MCHC: 34.3 g/dL (ref 30.0–36.0)
MCV: 90.8 fL (ref 78.0–100.0)
Monocytes Absolute: 0.5 10*3/uL (ref 0.1–1.0)
Monocytes Relative: 7 %
Neutro Abs: 4.9 10*3/uL (ref 1.7–7.7)
Neutrophils Relative %: 69 %
Platelets: 226 10*3/uL (ref 150–400)
RBC: 4.04 MIL/uL (ref 3.87–5.11)
RDW: 12.6 % (ref 11.5–15.5)
WBC: 7.1 10*3/uL (ref 4.0–10.5)

## 2016-05-13 LAB — BASIC METABOLIC PANEL
Anion gap: 7 (ref 5–15)
BUN: 11 mg/dL (ref 6–20)
CO2: 23 mmol/L (ref 22–32)
Calcium: 8.8 mg/dL — ABNORMAL LOW (ref 8.9–10.3)
Chloride: 105 mmol/L (ref 101–111)
Creatinine, Ser: 0.65 mg/dL (ref 0.44–1.00)
GFR calc Af Amer: >60 mL/min (ref >60)
GFR calc non Af Amer: >60 mL/min (ref >60)
Glucose, Bld: 103 mg/dL — ABNORMAL HIGH (ref 65–99)
Potassium: 3.8 mmol/L (ref 3.5–5.1)
Sodium: 135 mmol/L (ref 135–145)

## 2016-05-13 LAB — HCG, SERUM, QUALITATIVE: Preg, Serum: NEGATIVE

## 2016-05-17 ENCOUNTER — Encounter (HOSPITAL_COMMUNITY)
Admission: RE | Disposition: A | Discharge status: Home / Self Care | Payer: Self-pay | Source: Ambulatory Visit | Attending: Gastroenterology

## 2016-05-17 ENCOUNTER — Other Ambulatory Visit: Payer: Self-pay

## 2016-05-17 ENCOUNTER — Encounter (HOSPITAL_COMMUNITY): Payer: Self-pay | Admitting: *Deleted

## 2016-05-17 ENCOUNTER — Ambulatory Visit (HOSPITAL_COMMUNITY): Payer: BLUE CROSS/BLUE SHIELD | Admitting: Anesthesiology

## 2016-05-17 ENCOUNTER — Telehealth: Payer: Self-pay | Admitting: Gastroenterology

## 2016-05-17 ENCOUNTER — Ambulatory Visit (HOSPITAL_COMMUNITY)
Admission: RE | Admit: 2016-05-17 | Discharge: 2016-05-17 | Disposition: A | Discharge status: Home / Self Care | Payer: BLUE CROSS/BLUE SHIELD | Source: Ambulatory Visit | Attending: Gastroenterology | Admitting: Gastroenterology

## 2016-05-17 DIAGNOSIS — K644 Residual hemorrhoidal skin tags: Secondary | ICD-10-CM | POA: Insufficient documentation

## 2016-05-17 DIAGNOSIS — Z905 Acquired absence of kidney: Secondary | ICD-10-CM | POA: Insufficient documentation

## 2016-05-17 DIAGNOSIS — K648 Other hemorrhoids: Secondary | ICD-10-CM

## 2016-05-17 DIAGNOSIS — Q438 Other specified congenital malformations of intestine: Secondary | ICD-10-CM | POA: Diagnosis not present

## 2016-05-17 DIAGNOSIS — K6289 Other specified diseases of anus and rectum: Secondary | ICD-10-CM | POA: Diagnosis not present

## 2016-05-17 DIAGNOSIS — Z8041 Family history of malignant neoplasm of ovary: Secondary | ICD-10-CM | POA: Insufficient documentation

## 2016-05-17 DIAGNOSIS — K921 Melena: Secondary | ICD-10-CM | POA: Insufficient documentation

## 2016-05-17 DIAGNOSIS — Z79899 Other long term (current) drug therapy: Secondary | ICD-10-CM | POA: Insufficient documentation

## 2016-05-17 DIAGNOSIS — K649 Unspecified hemorrhoids: Secondary | ICD-10-CM

## 2016-05-17 DIAGNOSIS — F418 Other specified anxiety disorders: Secondary | ICD-10-CM | POA: Insufficient documentation

## 2016-05-17 HISTORY — PX: COLONOSCOPY WITH PROPOFOL: SHX5780

## 2016-05-17 HISTORY — PX: HEMORRHOID BANDING: SHX5850

## 2016-05-17 LAB — GLUCOSE, CAPILLARY: Glucose-Capillary: 78 mg/dL (ref 65–99)

## 2016-05-17 SURGERY — COLONOSCOPY WITH PROPOFOL
Anesthesia: Monitor Anesthesia Care

## 2016-05-17 MED ORDER — FENTANYL CITRATE (PF) 100 MCG/2ML IJ SOLN
25.0000 ug | INTRAMUSCULAR | Status: AC
  Administered 2016-05-17 (×2): 25 ug via INTRAVENOUS

## 2016-05-17 MED ORDER — ONDANSETRON HCL 4 MG/2ML IJ SOLN: 4.0000 mg | Freq: Once | INTRAMUSCULAR | Status: DC | PRN

## 2016-05-17 MED ORDER — FENTANYL CITRATE (PF) 100 MCG/2ML IJ SOLN
INTRAMUSCULAR | Status: DC | PRN
  Administered 2016-05-17 (×2): 12.5 ug via INTRAVENOUS
  Administered 2016-05-17: 25 ug via INTRAVENOUS

## 2016-05-17 MED ORDER — MIDAZOLAM HCL 2 MG/2ML IJ SOLN
INTRAMUSCULAR | Status: AC
  Filled 2016-05-17: qty 2

## 2016-05-17 MED ORDER — LACTATED RINGERS IV SOLN
INTRAVENOUS | Status: DC
  Administered 2016-05-17 (×2): via INTRAVENOUS

## 2016-05-17 MED ORDER — MIDAZOLAM HCL 2 MG/2ML IJ SOLN
1.0000 mg | INTRAMUSCULAR | Status: DC | PRN
  Administered 2016-05-17 (×2): 2 mg via INTRAVENOUS
  Filled 2016-05-17: qty 2

## 2016-05-17 MED ORDER — ONDANSETRON HCL 4 MG/2ML IJ SOLN
INTRAMUSCULAR | Status: AC
  Filled 2016-05-17: qty 2

## 2016-05-17 MED ORDER — FENTANYL CITRATE (PF) 100 MCG/2ML IJ SOLN: 25.0000 ug | INTRAMUSCULAR | Status: DC | PRN

## 2016-05-17 MED ORDER — PROPOFOL 10 MG/ML IV BOLUS
INTRAVENOUS | Status: DC | PRN
  Administered 2016-05-17 (×2): 10 mg via INTRAVENOUS

## 2016-05-17 MED ORDER — ONDANSETRON HCL 4 MG/2ML IJ SOLN
4.0000 mg | Freq: Once | INTRAMUSCULAR | Status: AC
  Administered 2016-05-17: 4 mg via INTRAVENOUS

## 2016-05-17 MED ORDER — FENTANYL CITRATE (PF) 100 MCG/2ML IJ SOLN
INTRAMUSCULAR | Status: AC
  Filled 2016-05-17: qty 2

## 2016-05-17 MED ORDER — PROPOFOL 500 MG/50ML IV EMUL
INTRAVENOUS | Status: DC | PRN
  Administered 2016-05-17: 50 ug/kg/min via INTRAVENOUS

## 2016-05-17 MED ORDER — MIDAZOLAM HCL 5 MG/5ML IJ SOLN
INTRAMUSCULAR | Status: DC | PRN
  Administered 2016-05-17 (×2): 1 mg via INTRAVENOUS

## 2016-05-17 MED ORDER — LIDOCAINE HCL (CARDIAC) 10 MG/ML IV SOLN
INTRAVENOUS | Status: DC | PRN
  Administered 2016-05-17: 50 mg via INTRAVENOUS

## 2016-05-17 NOTE — Transfer of Care (Signed)
Immediate Anesthesia Transfer of Care Note  Patient: Sandra Glover  Procedure(s) Performed: Procedure(s) with comments: COLONOSCOPY WITH PROPOFOL (N/A) - 1000 HEMORRHOID BANDING (N/A)  Patient Location: PACU  Anesthesia Type:MAC  Level of Consciousness: awake and patient cooperative  Airway & Oxygen Therapy: Patient Spontanous Breathing  Post-op Assessment: Report given to RN, Post -op Vital signs reviewed and stable and Patient moving all extremities  Post vital signs: Reviewed and stable  Last Vitals:  Filed Vitals:   05/17/16 1030 05/17/16 1118  BP: 116/71 100/59  Pulse:  65  Temp:  36.6 C  Resp: 16 23    Last Pain: There were no vitals filed for this visit.    Patients Stated Pain Goal: 7 (05/17/16 [id number])  Complications: No apparent anesthesia complications

## 2016-05-17 NOTE — Anesthesia Postprocedure Evaluation (Signed)
Anesthesia Post Note  Patient: Penni HomansJessica H Iser  Procedure(s) Performed: Procedure(s) (LRB): COLONOSCOPY WITH PROPOFOL (N/A) HEMORRHOID BANDING (N/A)  Patient location during evaluation: PACU Anesthesia Type: MAC Level of consciousness: awake and alert Pain management: pain level controlled Vital Signs Assessment: post-procedure vital signs reviewed and stable Respiratory status: spontaneous breathing Cardiovascular status: stable Anesthetic complications: no    Last Vitals:  Filed Vitals:   05/17/16 1130 05/17/16 1145  BP: 100/73 114/71  Pulse: 72 66  Temp:    Resp: 14 17    Last Pain: There were no vitals filed for this visit.               Minerva AreolaYATES,Stanford Strauch

## 2016-05-17 NOTE — Discharge Instructions (Signed)
You have SMALL internal  AND LARGE EXTERNAL hemorrhoids. YOU DID NOT HAVE ANY POLYPS.   DRINK WATER TO KEEP YOUR URINE LIGHT YELLOW.  FOLLOW A HIGH FIBER DIET. AVOID ITEMS THAT CAUSE BLOATING. SEE INFO BELOW.  USE APOTHECARY HEMORRHOID CREAM 2 TO 4 TIMES A DAY IF NEEDED TO RELIEVE RECTAL PRESSURE. DO NOT USE MORE THAN 10 DAYS IN A ROW.  SEE SURGERY TO FIX YOUR HEMORRHOIDS.  FOLLOW UP IN 4 MOS.    Colonoscopy Care After Read the instructions outlined below and refer to this sheet in the next week. These discharge instructions provide you with general information on caring for yourself after you leave the hospital. While your treatment has been planned according to the most current medical practices available, unavoidable complications occasionally occur. If you have any problems or questions after discharge, call DR. Yulian Gosney, [phone].  ACTIVITY  You may resume your regular activity, but move at a slower pace for the next 24 hours.   Take frequent rest periods for the next 24 hours.   Walking will help get rid of the air and reduce the bloated feeling in your belly (abdomen).   No driving for 24 hours (because of the medicine (anesthesia) used during the test).   You may shower.   Do not sign any important legal documents or operate any machinery for 24 hours (because of the anesthesia used during the test).    NUTRITION  Drink plenty of fluids.   You may resume your normal diet as instructed by your doctor.   Begin with a light meal and progress to your normal diet. Heavy or fried foods are harder to digest and may make you feel sick to your stomach (nauseated).   Avoid alcoholic beverages for 24 hours or as instructed.    MEDICATIONS  You may resume your normal medications.   WHAT YOU CAN EXPECT TODAY  Some feelings of bloating in the abdomen.   Passage of more gas than usual.   Spotting of blood in your stool or on the toilet paper  .  IF YOU HAD POLYPS  REMOVED DURING THE COLONOSCOPY:  Eat a soft diet IF YOU HAVE NAUSEA, BLOATING, ABDOMINAL PAIN, OR VOMITING.    FINDING OUT THE RESULTS OF YOUR TEST Not all test results are available during your visit. DR. Darrick PennaFIELDS WILL CALL YOU WITHIN 7 DAYS OF YOUR PROCEDUE WITH YOUR RESULTS. Do not assume everything is normal if you have not heard from DR. Yuniel Blaney IN ONE WEEK, CALL HER OFFICE AT [phone].  SEEK IMMEDIATE MEDICAL ATTENTION AND CALL THE OFFICE: [phone] IF:  You have more than a spotting of blood in your stool.   Your belly is swollen (abdominal distention).   You are nauseated or vomiting.   You have a temperature over 101F.   You have abdominal pain or discomfort that is severe or gets worse throughout the day.  High-Fiber Diet A high-fiber diet changes your normal diet to include more whole grains, legumes, fruits, and vegetables. Changes in the diet involve replacing refined carbohydrates with unrefined foods. The calorie level of the diet is essentially unchanged. The Dietary Reference Intake (recommended amount) for adult males is 38 grams per day. For adult females, it is 25 grams per day. Pregnant and lactating women should consume 28 grams of fiber per day. Fiber is the intact part of a plant that is not broken down during digestion. Functional fiber is fiber that has been isolated from the plant to provide a  beneficial effect in the body. PURPOSE  Increase stool bulk.   Ease and regulate bowel movements.   Lower cholesterol.  REDUCE RISK OF COLON CANCER  INDICATIONS THAT YOU NEED MORE FIBER  Constipation and hemorrhoids.   Uncomplicated diverticulosis (intestine condition) and irritable bowel syndrome.   Weight management.   As a protective measure against hardening of the arteries (atherosclerosis), diabetes, and cancer.   GUIDELINES FOR INCREASING FIBER IN THE DIET  Start adding fiber to the diet slowly. A gradual increase of about 5 more grams (2 slices  of whole-wheat bread, 2 servings of most fruits or vegetables, or 1 bowl of high-fiber cereal) per day is best. Too rapid an increase in fiber may result in constipation, flatulence, and bloating.   Drink enough water and fluids to keep your urine clear or pale yellow. Water, juice, or caffeine-free drinks are recommended. Not drinking enough fluid may cause constipation.   Eat a variety of high-fiber foods rather than one type of fiber.   Try to increase your intake of fiber through using high-fiber foods rather than fiber pills or supplements that contain small amounts of fiber.   The goal is to change the types of food eaten. Do not supplement your present diet with high-fiber foods, but replace foods in your present diet.   INCLUDE A VARIETY OF FIBER SOURCES  Replace refined and processed grains with whole grains, canned fruits with fresh fruits, and incorporate other fiber sources. White rice, white breads, and most bakery goods contain little or no fiber.   Brown whole-grain rice, buckwheat oats, and many fruits and vegetables are all good sources of fiber. These include: broccoli, Brussels sprouts, cabbage, cauliflower, beets, sweet potatoes, white potatoes (skin on), carrots, tomatoes, eggplant, squash, berries, fresh fruits, and dried fruits.   Cereals appear to be the richest source of fiber. Cereal fiber is found in whole grains and bran. Bran is the fiber-rich outer coat of cereal grain, which is largely removed in refining. In whole-grain cereals, the bran remains. In breakfast cereals, the largest amount of fiber is found in those with "bran" in their names. The fiber content is sometimes indicated on the label.   You may need to include additional fruits and vegetables each day.   In baking, for 1 cup white flour, you may use the following substitutions:   1 cup whole-wheat flour minus 2 tablespoons.   1/2 cup white flour plus 1/2 cup whole-wheat flour.    Hemorrhoids Hemorrhoids are dilated (enlarged) veins around the rectum. Sometimes clots will form in the veins. This makes them swollen and painful. These are called thrombosed hemorrhoids. Causes of hemorrhoids include:  Constipation.   Straining to have a bowel movement.   HEAVY LIFTING  HOME CARE INSTRUCTIONS  Eat a well balanced diet and drink 6 to 8 glasses of water every day to avoid constipation. You may also use a bulk laxative.   Avoid straining to have bowel movements.   Keep anal area dry and clean.   Do not use a donut shaped pillow or sit on the toilet for long periods. This increases blood pooling and pain.   Move your bowels when your body has the urge; this will require less straining and will decrease pain and pressure.

## 2016-05-17 NOTE — Anesthesia Procedure Notes (Signed)
Procedure Name: MAC Date/Time: 05/17/2016 10:29 AM Performed by: Franco NonesYATES, Verlan Grotz S Pre-anesthesia Checklist: Patient identified, Emergency Drugs available, Suction available, Timeout performed and Patient being monitored Patient Re-evaluated:Patient Re-evaluated prior to inductionOxygen Delivery Method: Non-rebreather mask

## 2016-05-17 NOTE — H&P (Signed)
Primary Care Physician:  Josem Kaufmann, MD Primary Gastroenterologist:  Dr. Oneida Alar  Pre-Procedure History & Physical: HPI:  Sandra Glover is a 30 y.o. female here for  RECTAL BLEEDING/pain.  Past Medical History  Diagnosis Date  . History of anemia 2013  . Sinus infection 01/25/2013    started antibiotic 01/24/2013 x 10 days; nasal congestion, clear drainage from nose  . History of depression     states stopped med. in 2013  . Tonsillar and adenoid hypertrophy 01/2013  . 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 Vaginal irritation 03/25/2014    Mild yeast but has history of yeast, will not treat til after first trimester,try yogurt  . Pertussis   . Postcoital bleeding 07/14/2015  . Gestational diabetes mellitus, antepartum     gestational only  . Anxiety   . Depression     Past Surgical History  Procedure Laterality Date  . Cholecystectomy  04/23/2010    laparoscopic  . Partial nephrectomy Right age 74 mos.  . Tonsillectomy and adenoidectomy N/A 01/29/2013    Procedure: TONSILLECTOMY AND ADENOIDECTOMY;  Surgeon: Ascencion Dike, MD;  Location: [facility];  Service: ENT;  Laterality: N/A;    Prior to Admission medications   Medication Sig Start Date End Date Taking? Authorizing Provider  ARIPiprazole (ABILIFY) 2 MG tablet Take 1 tablet (2 mg total) by mouth daily. 04/04/16  Yes Cloria Spring, MD  clonazePAM (KLONOPIN) 1 MG tablet Take 1 tablet (1 mg total) by mouth 2 (two) times daily as needed for anxiety. 04/04/16 04/04/17 Yes Cloria Spring, MD  FLUoxetine (PROZAC) 40 MG capsule Take 1 capsule (40 mg total) by mouth daily. 04/04/16 04/04/17 Yes Cloria Spring, MD  ibuprofen (ADVIL,MOTRIN) 800 MG tablet Take 1 tablet (800 mg total) by mouth 3 (three) times daily. Patient taking differently: Take 800 mg by mouth every 8 (eight) hours as needed for moderate pain.  09/07/15  Yes Ezequiel Essex, MD  linaclotide [facility]) 145 MCG CAPS  capsule 1 PO 30 mins prior to your first meal 03/24/16  Yes Danie Binder, MD  traZODone (DESYREL) 50 MG tablet Take 1 tablet (50 mg total) by mouth at bedtime. 04/04/16  Yes Cloria Spring, MD  etonogestrel (NEXPLANON) 68 MG IMPL implant 1 each by Subdermal route once.    Historical Provider, MD  Lidocaine-Hydrocortisone Ace 3-2.5 % KIT APPLY TO RECTUM QID FOR 2 WEEKS Patient not taking: Reported on 05/09/2016 03/24/16   Danie Binder, MD    Allergies as of 03/24/2016 - Review Complete 03/24/2016  Allergen Reaction Noted  . Lortab [hydrocodone-acetaminophen] Nausea And Vomiting 01/29/2013  . Morphine and related Rash 01/25/2013    Family History  Problem Relation Age of Onset  . COPD Mother   . Depression Mother   . Anxiety disorder Mother   . Ovarian cancer Maternal Aunt   . Ovarian cancer Maternal Grandmother   . Bipolar disorder Paternal Uncle   . Anxiety disorder Cousin   . Drug abuse Cousin   . Bipolar disorder Cousin     Social History   Social History  . Marital Status: Married    Spouse Name: N/A  . Number of Children: N/A  . Years of Education: N/A   Occupational History  . Not on file.   Social History Main Topics  . Smoking status: Never Smoker   . Smokeless tobacco: Never Used  Comment: Never smoked  . Alcohol Use: No     Comment: socially  . Drug Use: No  . Sexual Activity: Yes    Birth Control/ Protection: None, Implant     Comment: nexplanon implant   Other Topics Concern  . Not on file   Social History Narrative    Review of Systems: See HPI, otherwise negative ROS   Physical Exam: BP 105/69 mmHg  Pulse 76  Temp(Src) 98.2 F (36.8 C) (Oral)  Resp 20  Ht _0  (1.6 m)  Wt 163 lb (73.936 kg)  BMI 28.88 kg/m2  SpO2 99%  LMP 05/13/2016 General:   Alert,  pleasant and cooperative in NAD Head:  Normocephalic and atraumatic. Neck:  Supple; Lungs:  Clear throughout to auscultation.    Heart:  Regular rate and rhythm. Abdomen:  Soft,  nontender and nondistended. Normal bowel sounds, without guarding, and without rebound.   Neurologic:  Alert and  oriented x4;  grossly normal neurologically.  Impression/Plan:     RECTAL BLEEDING  PLAN:  1. TCS/? Hemorrhoid banding TODAY

## 2016-05-17 NOTE — Anesthesia Preprocedure Evaluation (Signed)
Anesthesia Evaluation  Patient identified by MRN, date of birth, ID band Patient awake    Reviewed: Allergy & Precautions, NPO status , Patient's Chart, lab work & pertinent test results  Airway Mallampati: II  TM Distance: >3 FB     Dental  (+) Teeth Intact   Pulmonary neg pulmonary ROS,    breath sounds clear to auscultation       Cardiovascular negative cardio ROS   Rhythm:Regular Rate:Normal     Neuro/Psych PSYCHIATRIC DISORDERS Anxiety Depression    GI/Hepatic   Endo/Other  diabetes (gestational)  Renal/GU      Musculoskeletal   Abdominal   Peds  Hematology   Anesthesia Other Findings   Reproductive/Obstetrics                             Anesthesia Physical Anesthesia Plan  ASA: II  Anesthesia Plan: MAC   Post-op Pain Management:    Induction: Intravenous  Airway Management Planned: Simple Face Mask  Additional Equipment:   Intra-op Plan:   Post-operative Plan:   Informed Consent: I have reviewed the patients History and Physical, chart, labs and discussed the procedure including the risks, benefits and alternatives for the proposed anesthesia with the patient or authorized representative who has indicated his/her understanding and acceptance.     Plan Discussed with:   Anesthesia Plan Comments:         Anesthesia Quick Evaluation

## 2016-05-17 NOTE — Telephone Encounter (Signed)
SURGERY  FOR HEMORRHOIDECTOMY.

## 2016-05-18 NOTE — Telephone Encounter (Signed)
Referral sent 

## 2016-05-18 NOTE — Op Note (Signed)
[facility] Patient Name: Sandra Glover Procedure Date: 05/17/2016 10:29 AM MRN: [id number] Date of Birth: Jun 22, 1986 Attending MD: Jonette Eva , MD CSN: [id number] Age: 30 Admit Type: Outpatient Procedure:                Colonoscopy, DIAGNOSTIC Indications:              Hematochezia, Rectal pain Providers:                Jonette Eva, MD, Loma Messing B. Patsy Lager, RN, Burke Keels, Technician Referring MD:              Medicines:                Propofol per Anesthesia Complications:            No immediate complications. Estimated Blood Loss:     Estimated blood loss: none. Procedure:                Pre-Anesthesia Assessment:                           - Prior to the procedure, a History and Physical                            was performed, and patient medications and                            allergies were reviewed. The patient's tolerance of                            previous anesthesia was also reviewed. The risks                            and benefits of the procedure and the sedation                            options and risks were discussed with the patient.                            All questions were answered, and informed consent                            was obtained. Prior Anticoagulants: The patient has                            taken ibuprofen, last dose was day of procedure.                            ASA Grade Assessment: II - A patient with mild                            systemic disease. After reviewing the risks and  benefits, the patient was deemed in satisfactory                            condition to undergo the procedure.                           After obtaining informed consent, the colonoscope                            was passed under direct vision. Throughout the                            procedure, the patient's blood pressure, pulse, and                            oxygen saturations  were monitored continuously. The                            EC-[id number] ([id number] scope was introduced through                            the anus and advanced to the 10 cm into the ileum.                            The terminal ileum, ileocecal valve, appendiceal                            orifice, and rectum were photographed. The                            colonoscopy was somewhat difficult due to a                            tortuous colon. The patient tolerated the procedure                            fairly well. The quality of the bowel preparation                            was good. Scope In: 10:53:00 AM Scope Out: 11:12:41 AM Scope Withdrawal Time: 0 hours 14 minutes 47 seconds  Total Procedure Duration: 0 hours 19 minutes 41 seconds  Findings:      The digital rectal exam findings include non-thrombosed external       hemorrhoids.      The terminal ileum appeared normal.      The recto-sigmoid colon and descending colon were moderately redundant.       Advancing the scope required using manual pressure.      LARGE Non-bleeding external and SMALL internal hemorrhoids were found. Impression:               - The ileum was normal.                           - MODERATELY Redundant colon.                           -  RECTAL BLEEDING/PAIN DUE TO Non-bleeding external                            and internal hemorrhoids.                           - Moderate Sedation:      Per Anesthesia Care Recommendation:           - High fiber diet.                           - Continue present medications.                           - Repeat colonoscopy AT AGE 30                           DRINK WATER TO KEEP YOUR URINE LIGHT YELLOW.                           USE APOTHECARY HEMORRHOID CREAM 2 TO 4 TIMES A DAY                            IF NEEDED TO RELIEVE RECTAL PRESSURE. DO NOT USE                            MORE THAN 10 DAYS IN A ROW.                           SEE SURGERY TO FIX YOUR  HEMORRHOIDS.                           - Patient has a contact number available for                            emergencies. The signs and symptoms of potential                            delayed complications were discussed with the                            patient. Return to normal activities tomorrow.                            Written discharge instructions were provided to the                            patient.                           - Return to my office in 4 months. Procedure Code(s):        --- Professional ---                           [phone], Colonoscopy, flexible; diagnostic, including  collection of specimen(s) by brushing or washing,                            when performed (separate procedure) Diagnosis Code(s):        --- Professional ---                           K64.4, Residual hemorrhoidal skin tags                           K64.8, Other hemorrhoids                           K92.1, Melena (includes Hematochezia)                           K62.89, Other specified diseases of anus and rectum                           Q43.8, Other specified congenital malformations of                            intestine CPT copyright 2016 [employer]. All rights reserved. The codes documented in this report are preliminary and upon coder review may  be revised to meet current compliance requirements. Jonette Eva, MD Jonette Eva, MD 05/18/2016 9:02:21 PM This report has been signed electronically. Number of Addenda: 0

## 2016-05-19 ENCOUNTER — Encounter (HOSPITAL_COMMUNITY): Payer: Self-pay | Admitting: Gastroenterology

## 2016-06-02 NOTE — Patient Instructions (Signed)
Sandra Glover  06/02/2016     @PREFPERIOPPHARMACY @   Your procedure is scheduled on 06/08/2016.  [facility]  Call this number if you have problems the morning of surgery:  [phone]   Remember:  Do not eat food or drink liquids after midnight.  Take these medicines the morning of surgery with A SIP OF WATER Abilify, Klonopin, Prozac, Linzess   Do not wear jewelry, make-up or nail polish.  Do not wear lotions, powders, or perfumes.  You may wear deoderant.  Do not shave 48 hours prior to surgery.  Men may shave face and neck.  Do not bring valuables to the hospital.  [facility] not responsible for any belongings or valuables.  Contacts, dentures or bridgework may not be worn into surgery.  Leave your suitcase in the car.  After surgery it may be brought to your room.  For patients admitted to the hospital, discharge time will be determined by your treatment team.  Patients discharged the day of surgery will not be allowed to drive home.    Please read over the following fact sheets that you were given. Surgical Site Infection Prevention and Anesthesia Post-op Instructions     PATIENT INSTRUCTIONS POST-ANESTHESIA  IMMEDIATELY FOLLOWING SURGERY:  Do not drive or operate machinery for the first twenty four hours after surgery.  Do not make any important decisions for twenty four hours after surgery or while taking narcotic pain medications or sedatives.  If you develop intractable nausea and vomiting or a severe headache please notify your doctor immediately.  FOLLOW-UP:  Please make an appointment with your surgeon as instructed. You do not need to follow up with anesthesia unless specifically instructed to do so.  WOUND CARE INSTRUCTIONS (if applicable):  Keep a dry clean dressing on the anesthesia/puncture wound site if there is drainage.  Once the wound has quit draining you may leave it open to air.  Generally you should leave the bandage  intact for twenty four hours unless there is drainage.  If the epidural site drains for more than 36-48 hours please call the anesthesia department.  QUESTIONS?:  Please feel free to call your physician or the hospital operator if you have any questions, and they will be happy to assist you.      Surgical Procedures for Hemorrhoids Surgical procedures can be used to treat hemorrhoids. Hemorrhoids are swollen veins that are inside the rectum (internal hemorrhoids) or around the anus (external hemorrhoids). They are caused by increased pressure in the anal area. This pressure may result from straining to have a bowel movement (constipation), diarrhea, pregnancy, obesity, anal sex, or sitting for long periods of time. Hemorrhoids can cause symptoms such as pain and bleeding. Surgery may be needed if diet changes, lifestyle changes, and other treatments do not help your symptoms. Various surgical methods may be used. Three common methods are:  Closed hemorrhoidectomy. The hemorrhoids are surgically removed, and the surgical cuts (incisions) are closed with stitches (sutures).  Open hemorrhoidectomy. The hemorrhoids are surgically removed, but the incisions are allowed to heal without sutures.  Stapled hemorrhoidopexy. The hemorrhoids are removed using a device that takes out a ring of excess tissue. LET [facility] KNOW ABOUT:  Any allergies you have.  All medicines you are taking, including vitamins, herbs, eye drops, creams, and over-the-counter medicines.  Previous problems you or members of your family have had with the use of anesthetics.  Any blood disorders  you have.  Previous surgeries you have had.  Any medical conditions you have.  Whether you are pregnant or may be pregnant. RISKS AND COMPLICATIONS Generally, this is a safe procedure. However, problems may occur, including:  Infection.  Bleeding.  Allergic reactions to medicines.  Damage to other structures  or organs.  Pain.  Constipation.  Difficulty passing urine.  Narrowing of the anal canal (stenosis).  Difficulty controlling bowel movements (incontinence). BEFORE THE PROCEDURE  Ask your health care provider about:  Changing or stopping your regular medicines. This is especially important if you are taking diabetes medicines or blood thinners.  Taking medicines such as aspirin and ibuprofen. These medicines can thin your blood. Do not take these medicines before your procedure if your health care provider instructs you not to.  You may need to have a procedure to examine the inside of your colon with a scope (colonoscopy). Your health care provider may do this to make sure that there are no other causes for your bleeding or pain.  Follow instructions from your health care provider about eating or drinking restrictions.  You may be instructed to take a laxative and an enema to clean out your colon before surgery (bowel prep). Carefully follow instructions from your health care provider about bowel prep.  Ask your health care provider how your surgical site will be marked or identified.  You may be given antibiotic medicine to help prevent infection.  Plan to have someone take you home after the procedure. PROCEDURE  To reduce your risk of infection:  Your health care team will wash or sanitize their hands.  Your skin will be washed with soap.  An IV tube will be inserted into one of your veins.  You will be given one or more of the following:  A medicine to help you relax (sedative).  A medicine to numb the area (local anesthetic).  A medicine to make you fall asleep (general anesthetic).  A medicine that is injected into an area of your body to numb everything below the injection site (regional anesthetic).  A lubricating jelly may be placed into your rectum.  Your surgeon will insert a short scope (anoscope) into your rectum to examine the hemorrhoids.  One of  the following hemorrhoid procedures will be performed. Closed Hemorrhoidectomy  Your surgeon will use surgical instruments to open the tissue around the hemorrhoids.  The veins that supply the hemorrhoids will be tied off with a suture.  The hemorrhoids will be removed.  The tissue that surrounds the hemorrhoids will be closed with sutures that your body can absorb (absorbable sutures). Open Hemorrhoidectomy  The hemorrhoids will be removed with surgical instruments.  The incisions will be left open to heal without sutures. Stapled Hemorrhoidopexy  Your surgeon will use a circular stapling device to remove the hemorrhoids.  The device will be inserted into your anus. It will remove a circular ring of tissue that includes hemorrhoid tissue and some tissue above the hemorrhoids.  The staples in the device will close the edges of removed tissue. This will cut off the blood supply to the hemorrhoids and will pull any remaining hemorrhoids back into place. Each of these procedures may vary among health care providers and hospitals. AFTER THE PROCEDURE  Your blood pressure, heart rate, breathing rate, and blood oxygen level will be monitored often until the medicines you were given have worn off.  You will be given pain medicine as needed.   This information is not intended  to replace advice given to you by your health care provider. Make sure you discuss any questions you have with your health care provider.   Document Released: 09/18/2009 Document Revised: 08/12/2015 Document Reviewed: 02/16/2015 Elsevier Interactive Patient Education [employer].

## 2016-06-03 ENCOUNTER — Encounter (HOSPITAL_COMMUNITY): Payer: Self-pay

## 2016-06-03 ENCOUNTER — Encounter (HOSPITAL_COMMUNITY): Payer: Self-pay | Admitting: *Deleted

## 2016-06-03 ENCOUNTER — Encounter (HOSPITAL_COMMUNITY)
Admission: RE | Admit: 2016-06-03 | Discharge: 2016-06-03 | Disposition: A | Discharge status: Home / Self Care | Payer: BLUE CROSS/BLUE SHIELD | Source: Ambulatory Visit | Attending: Surgery | Admitting: Surgery

## 2016-06-03 ENCOUNTER — Encounter (HOSPITAL_COMMUNITY): Payer: Self-pay | Admitting: Psychiatry

## 2016-06-03 ENCOUNTER — Ambulatory Visit (INDEPENDENT_AMBULATORY_CARE_PROVIDER_SITE_OTHER): Payer: BLUE CROSS/BLUE SHIELD | Admitting: Psychiatry

## 2016-06-03 DIAGNOSIS — K642 Third degree hemorrhoids: Secondary | ICD-10-CM | POA: Diagnosis not present

## 2016-06-03 DIAGNOSIS — Z01812 Encounter for preprocedural laboratory examination: Secondary | ICD-10-CM | POA: Diagnosis present

## 2016-06-03 DIAGNOSIS — F331 Major depressive disorder, recurrent, moderate: Secondary | ICD-10-CM

## 2016-06-03 LAB — BASIC METABOLIC PANEL
Anion gap: 4 — ABNORMAL LOW (ref 5–15)
BUN: 8 mg/dL (ref 6–20)
CO2: 26 mmol/L (ref 22–32)
Calcium: 9 mg/dL (ref 8.9–10.3)
Chloride: 109 mmol/L (ref 101–111)
Creatinine, Ser: 0.77 mg/dL (ref 0.44–1.00)
GFR calc Af Amer: >60 mL/min (ref >60)
GFR calc non Af Amer: >60 mL/min (ref >60)
Glucose, Bld: 96 mg/dL (ref 65–99)
Potassium: 3.4 mmol/L — ABNORMAL LOW (ref 3.5–5.1)
Sodium: 139 mmol/L (ref 135–145)

## 2016-06-03 LAB — CBC
HCT: 37.9 % (ref 36.0–46.0)
Hemoglobin: 13 g/dL (ref 12.0–15.0)
MCH: 30.3 pg (ref 26.0–34.0)
MCHC: 34.3 g/dL (ref 30.0–36.0)
MCV: 88.3 fL (ref 78.0–100.0)
Platelets: 244 10*3/uL (ref 150–400)
RBC: 4.29 MIL/uL (ref 3.87–5.11)
RDW: 12.5 % (ref 11.5–15.5)
WBC: 5.7 10*3/uL (ref 4.0–10.5)

## 2016-06-03 LAB — HCG, SERUM, QUALITATIVE: Preg, Serum: NEGATIVE

## 2016-06-03 MED ORDER — CHLORHEXIDINE GLUCONATE CLOTH 2 % EX PADS
6.0000 | MEDICATED_PAD | Freq: Once | CUTANEOUS | Status: DC
Start: 2016-06-03 — End: 2016-06-04

## 2016-06-03 MED ORDER — CHLORHEXIDINE GLUCONATE CLOTH 2 % EX PADS: 6.0000 | MEDICATED_PAD | Freq: Once | CUTANEOUS | Status: DC

## 2016-06-03 NOTE — Patient Instructions (Signed)
Discussed orally 
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
## 2016-06-04 ENCOUNTER — Other Ambulatory Visit (HOSPITAL_COMMUNITY): Payer: Self-pay | Admitting: Psychiatry

## 2016-06-04 ENCOUNTER — Encounter (HOSPITAL_COMMUNITY): Payer: Self-pay | Admitting: Surgery

## 2016-06-04 NOTE — H&P (Addendum)
SURGICAL HISTORY & PHYSICAL   HISTORY OF PRESENT ILLNESS (HPI):  30 y.o. female presented for pain, itching, and burning with defecation and the sensation that she never completely empties her rectum. Following bowel movements, patient states she occasionally sees blood on the toilet paper and needs to manually press hemorrhoids back inside after nearly every bowel movement. Patient also reports she has had difficulty with constipation, is being treated for IBS-C, using Miralax prn to relieve constipation, and recently (6/13) underwent colonoscopy that was otherwise normal except for hemorrhoids.  PAST MEDICAL HISTORY (PMH):  Past Medical History  Diagnosis Date  . History of anemia 2013  . Sinus infection 01/25/2013    started antibiotic 01/24/2013 x 10 days; nasal congestion, clear drainage from nose  . History of depression     states stopped med. in 2013  . Tonsillar and adenoid hypertrophy 01/2013  . 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 Vaginal irritation 03/25/2014    Mild yeast but has history of yeast, will not treat til after first trimester,try yogurt  . Pertussis   . Postcoital bleeding 07/14/2015  . Gestational diabetes mellitus, antepartum     gestational only  . Anxiety   . Depression     Reviewed. Otherwise negative.   PAST SURGICAL HISTORY [facility]):  Past Surgical History  Procedure Laterality Date  . Cholecystectomy  04/23/2010    laparoscopic  . Partial nephrectomy Right age 54 mos.  . Tonsillectomy and adenoidectomy N/A 01/29/2013    Procedure: TONSILLECTOMY AND ADENOIDECTOMY;  Surgeon: Ascencion Dike, MD;  Location: [place];  Service: ENT;  Laterality: N/A;  . Colonoscopy with propofol N/A 05/17/2016    Procedure: COLONOSCOPY WITH PROPOFOL;  Surgeon: Danie Binder, MD;  [facility];  Service: Endoscopy;  Laterality: N/A;  1000  . Hemorrhoid banding N/A 05/17/2016    Procedure: HEMORRHOID BANDING;   Surgeon: Danie Binder, MD;  Location: [facility];  Service: Endoscopy;  Laterality: N/A;    Reviewed. Otherwise negative.   MEDICATIONS:  Prior to Admission medications   Medication Sig Start Date End Date Taking? Authorizing Provider  ARIPiprazole (ABILIFY) 2 MG tablet Take 1 tablet (2 mg total) by mouth daily. 04/04/16  Yes Cloria Spring, MD  clonazePAM (KLONOPIN) 1 MG tablet Take 1 tablet (1 mg total) by mouth 2 (two) times daily as needed for anxiety. 04/04/16 04/04/17 Yes Cloria Spring, MD  etonogestrel (NEXPLANON) 68 MG IMPL implant 1 each by Subdermal route once.   Yes Historical Provider, MD  FLUoxetine (PROZAC) 40 MG capsule Take 1 capsule (40 mg total) by mouth daily. 04/04/16 04/04/17 Yes Cloria Spring, MD  ibuprofen (ADVIL,MOTRIN) 800 MG tablet Take 1 tablet (800 mg total) by mouth 3 (three) times daily. Patient taking differently: Take 800 mg by mouth every 8 (eight) hours as needed for moderate pain.  09/07/15  Yes Ezequiel Essex, MD  linaclotide [facility]) 145 MCG CAPS capsule 1 PO 30 mins prior to your first meal Patient taking differently: Take 145 mcg by mouth daily before breakfast. 1 PO 30 mins prior to your first meal 03/24/16  Yes Danie Binder, MD  traZODone (DESYREL) 50 MG tablet Take 1 tablet (50 mg total) by mouth at bedtime. 04/04/16  Yes Cloria Spring, MD  Lidocaine-Hydrocortisone Ace 3-2.5 % KIT APPLY TO RECTUM QID FOR 2 WEEKS Patient not taking: Reported on 05/09/2016 03/24/16   [facility]  Rexene Edison, MD     ALLERGIES:  Allergies  Allergen Reactions  . Lortab [Hydrocodone-Acetaminophen] Nausea And Vomiting  . Morphine And Related Rash     SOCIAL HISTORY:  Social History   Social History  . Marital Status: Married    Spouse Name: N/A  . Number of Children: N/A  . Years of Education: N/A   Occupational History  . Not on file.   Social History Main Topics  . Smoking status: Never Smoker   . Smokeless tobacco: Never Used     Comment: Never smoked  . Alcohol Use:  0.0 oz/week    0 Standard drinks or equivalent per week     Comment: socially  . Drug Use: No  . Sexual Activity: Yes    Birth Control/ Protection: None, Implant     Comment: nexplanon implant   Other Topics Concern  . Not on file   Social History Narrative    The patient currently resides (home / rehab facility / nursing home): Home with husband, children  The patient normally is (ambulatory / bedbound) : Ambulatory   FAMILY HISTORY:  Family History  Problem Relation Age of Onset  . COPD Mother   . Depression Mother   . Anxiety disorder Mother   . Ovarian cancer Maternal Aunt   . Ovarian cancer Maternal Grandmother   . Bipolar disorder Paternal Uncle   . Anxiety disorder Cousin   . Drug abuse Cousin   . Bipolar disorder Cousin   . Hypertension Mother   . Irritable bowel syndrome Mother   . Arthritis Mother   . Hypertension Father   . GER disease Father   . Skin cancer Mother     Otherwise negative.   REVIEW OF SYSTEMS:  Constitutional: denies any other weight loss, fever, chills, or sweats  Eyes: denies any other vision changes, history of eye injury  ENT: denies sore throat, hearing problems  Respiratory: denies shortness of breath, wheezing  Cardiovascular: denies chest pain, palpitations  Gastrointestinal: denies N/V, diarrhea  Musculoskeletal: denies any other joint pains or cramps  Skin: Denies any other rashes or skin discolorations  Neurological: denies any other headache, dizziness, weakness  Psychiatric: denies any other depression, anxiety   All other review of systems were otherwise negative.  VITAL SIGNS:                INTAKE/OUTPUT:  This shift:    Last 2 shifts: '@IOLAST2SHIFTS' @  PHYSICAL EXAM:  Constitutional:  -- Normal body habitus  -- Awake, alert, and oriented x3  Eyes:  -- Pupils equally round and reactive to light  -- No scleral icterus  Ear, nose, throat:  -- No jugular venous distension  Pulmonary:  -- No crackles  -- Equal  breath sounds bilaterally  Cardiovascular:  -- S1, S2 present  -- No pericardial rubs  Gastrointestinal:  -- Soft, nontender, nondistended, no guarding/rebound  -- No abdominal masses appreciated, pulsatile or otherwise  Rectal: -- Anal tenderness and tight sphincter tone associated with external and single visualized posterior internal hemorrhoids Musculoskeletal / Integumentary:  -- Wounds or skin discoloration: None -- Extremities: B/L UE and LE FROM, hands and feet warm, no edema  Neurologic:  -- Motor function: Intact and symmetric -- Sensation: Intact and symmetric  Labs:  CBC:  Lab Results  Component Value Date   WBC 5.7 06/03/2016   RBC 4.29 06/03/2016   BMP:  Lab Results  Component Value Date   GLUCOSE 96 06/03/2016   CO2 26  06/03/2016   BUN 8 06/03/2016   CREATININE 0.77 06/03/2016   CALCIUM 9.0 06/03/2016     Imaging studies:  No pertinent imaging studies   Assessment/Plan:  30 y.o. female with symptomatic 3rd degree internal hemorrhoid and external hemorrhoids, complicated by pertinent comorbidities including IBS-C/chronic constipation, anxiety and depression.    - continue current hemorrhoidal pain regimen   - ensure adequate hydration and current bowel regimen   - all risks, benefits, and alternatives to elective hemorrhoidectomy were discussed, and informed consent was obtained  - will plan for elective hemorrhoidectomy Wednesday, 06/08/2016  - DVT prophylaxis  All of the above findings and recommendations were discussed with the patient, and all of her questions were answered to her expressed satisfaction.  -- Marilynne Drivers Rosana Hoes, MD, [place]: [facility] Office: [phone]

## 2016-06-08 ENCOUNTER — Encounter (HOSPITAL_COMMUNITY)
Admission: RE | Disposition: A | Discharge status: Home / Self Care | Payer: Self-pay | Source: Ambulatory Visit | Attending: Surgery

## 2016-06-08 ENCOUNTER — Ambulatory Visit (HOSPITAL_COMMUNITY)
Admission: RE | Admit: 2016-06-08 | Discharge: 2016-06-08 | Disposition: A | Discharge status: Home / Self Care | Payer: BLUE CROSS/BLUE SHIELD | Source: Ambulatory Visit | Attending: Surgery | Admitting: Surgery

## 2016-06-08 ENCOUNTER — Ambulatory Visit (HOSPITAL_COMMUNITY): Payer: BLUE CROSS/BLUE SHIELD | Admitting: Anesthesiology

## 2016-06-08 ENCOUNTER — Encounter (HOSPITAL_COMMUNITY): Payer: Self-pay

## 2016-06-08 DIAGNOSIS — K642 Third degree hemorrhoids: Secondary | ICD-10-CM | POA: Insufficient documentation

## 2016-06-08 DIAGNOSIS — F419 Anxiety disorder, unspecified: Secondary | ICD-10-CM | POA: Insufficient documentation

## 2016-06-08 DIAGNOSIS — E119 Type 2 diabetes mellitus without complications: Secondary | ICD-10-CM | POA: Insufficient documentation

## 2016-06-08 DIAGNOSIS — F329 Major depressive disorder, single episode, unspecified: Secondary | ICD-10-CM | POA: Diagnosis not present

## 2016-06-08 DIAGNOSIS — K644 Residual hemorrhoidal skin tags: Secondary | ICD-10-CM | POA: Insufficient documentation

## 2016-06-08 HISTORY — PX: HEMORRHOID SURGERY: SHX153

## 2016-06-08 SURGERY — HEMORRHOIDECTOMY
Anesthesia: General

## 2016-06-08 MED ORDER — PHENYLEPHRINE 40 MCG/ML (10ML) SYRINGE FOR IV PUSH (FOR BLOOD PRESSURE SUPPORT)
PREFILLED_SYRINGE | INTRAVENOUS | Status: AC
  Filled 2016-06-08: qty 10

## 2016-06-08 MED ORDER — LACTATED RINGERS IV SOLN
INTRAVENOUS | Status: DC
  Administered 2016-06-08: 1000 mL via INTRAVENOUS

## 2016-06-08 MED ORDER — MIDAZOLAM HCL 2 MG/2ML IJ SOLN
1.0000 mg | INTRAMUSCULAR | Status: DC | PRN
  Administered 2016-06-08: 2 mg via INTRAVENOUS

## 2016-06-08 MED ORDER — FENTANYL CITRATE (PF) 100 MCG/2ML IJ SOLN
25.0000 ug | INTRAMUSCULAR | Status: DC | PRN
  Administered 2016-06-08 (×2): 50 ug via INTRAVENOUS

## 2016-06-08 MED ORDER — BUPIVACAINE HCL (PF) 0.5 % IJ SOLN
INTRAMUSCULAR | Status: AC
  Filled 2016-06-08: qty 30

## 2016-06-08 MED ORDER — ONDANSETRON HCL 4 MG/2ML IJ SOLN
4.0000 mg | Freq: Once | INTRAMUSCULAR | Status: AC
  Administered 2016-06-08: 4 mg via INTRAVENOUS

## 2016-06-08 MED ORDER — FENTANYL CITRATE (PF) 100 MCG/2ML IJ SOLN
25.0000 ug | INTRAMUSCULAR | Status: AC | PRN
  Administered 2016-06-08 (×2): 25 ug via INTRAVENOUS

## 2016-06-08 MED ORDER — PROPOFOL 10 MG/ML IV BOLUS
INTRAVENOUS | Status: DC | PRN
  Administered 2016-06-08: 150 mg via INTRAVENOUS

## 2016-06-08 MED ORDER — EPHEDRINE SULFATE 50 MG/ML IJ SOLN
INTRAMUSCULAR | Status: AC
  Filled 2016-06-08: qty 1

## 2016-06-08 MED ORDER — EPHEDRINE SULFATE 50 MG/ML IJ SOLN
INTRAMUSCULAR | Status: DC | PRN
  Administered 2016-06-08: 5 mg via INTRAVENOUS

## 2016-06-08 MED ORDER — FLEET ENEMA 7-19 GM/118ML RE ENEM
1.0000 | ENEMA | Freq: Once | RECTAL | Status: AC
  Administered 2016-06-08: 1 via RECTAL
  Filled 2016-06-08: qty 1

## 2016-06-08 MED ORDER — FENTANYL CITRATE (PF) 100 MCG/2ML IJ SOLN
INTRAMUSCULAR | Status: DC | PRN
  Administered 2016-06-08: 50 ug via INTRAVENOUS
  Administered 2016-06-08 (×2): 25 ug via INTRAVENOUS

## 2016-06-08 MED ORDER — SUCCINYLCHOLINE CHLORIDE 20 MG/ML IJ SOLN
INTRAMUSCULAR | Status: AC
  Filled 2016-06-08: qty 1

## 2016-06-08 MED ORDER — PROPOFOL 10 MG/ML IV BOLUS
INTRAVENOUS | Status: AC
  Filled 2016-06-08: qty 40

## 2016-06-08 MED ORDER — PROMETHAZINE HCL 25 MG/ML IJ SOLN
INTRAMUSCULAR | Status: AC
  Filled 2016-06-08: qty 1

## 2016-06-08 MED ORDER — CEFAZOLIN SODIUM-DEXTROSE 2-4 GM/100ML-% IV SOLN
2.0000 g | INTRAVENOUS | Status: AC
  Administered 2016-06-08: 2 g via INTRAVENOUS

## 2016-06-08 MED ORDER — FENTANYL CITRATE (PF) 100 MCG/2ML IJ SOLN
INTRAMUSCULAR | Status: AC
  Filled 2016-06-08: qty 2

## 2016-06-08 MED ORDER — CEFAZOLIN SODIUM-DEXTROSE 2-4 GM/100ML-% IV SOLN
INTRAVENOUS | Status: AC
  Filled 2016-06-08: qty 100

## 2016-06-08 MED ORDER — SODIUM CHLORIDE 0.9 % IJ SOLN
INTRAMUSCULAR | Status: AC
  Filled 2016-06-08: qty 10

## 2016-06-08 MED ORDER — SURGILUBE EX GEL
CUTANEOUS | Status: DC | PRN
  Administered 2016-06-08: 1 via TOPICAL

## 2016-06-08 MED ORDER — OXYCODONE-ACETAMINOPHEN 5-325 MG PO TABS: 1.0000 | ORAL_TABLET | ORAL | Status: DC | PRN

## 2016-06-08 MED ORDER — ONDANSETRON HCL 4 MG/2ML IJ SOLN
INTRAMUSCULAR | Status: AC
Start: 2016-06-08 — End: 2016-06-08
  Filled 2016-06-08: qty 2

## 2016-06-08 MED ORDER — LIDOCAINE HCL (PF) 1 % IJ SOLN
INTRAMUSCULAR | Status: AC
  Filled 2016-06-08: qty 30

## 2016-06-08 MED ORDER — SODIUM CHLORIDE 0.9% FLUSH
INTRAVENOUS | Status: AC
  Filled 2016-06-08: qty 10

## 2016-06-08 MED ORDER — LIDOCAINE HCL 1 % IJ SOLN
INTRAMUSCULAR | Status: DC | PRN
  Administered 2016-06-08: 3 mL

## 2016-06-08 MED ORDER — LIDOCAINE VISCOUS 2 % MT SOLN
OROMUCOSAL | Status: DC | PRN
  Administered 2016-06-08: 3 mL via OROMUCOSAL

## 2016-06-08 MED ORDER — LIDOCAINE HCL (CARDIAC) 20 MG/ML IV SOLN
INTRAVENOUS | Status: DC | PRN
  Administered 2016-06-08: 50 mg via INTRAVENOUS

## 2016-06-08 MED ORDER — ARTIFICIAL TEARS OP OINT
TOPICAL_OINTMENT | OPHTHALMIC | Status: AC
  Filled 2016-06-08: qty 3.5

## 2016-06-08 MED ORDER — ONDANSETRON HCL 4 MG/2ML IJ SOLN
INTRAMUSCULAR | Status: AC
  Filled 2016-06-08: qty 2

## 2016-06-08 MED ORDER — MIDAZOLAM HCL 2 MG/2ML IJ SOLN
INTRAMUSCULAR | Status: AC
  Filled 2016-06-08: qty 2

## 2016-06-08 MED ORDER — LIDOCAINE VISCOUS 2 % MT SOLN
OROMUCOSAL | Status: AC
  Filled 2016-06-08: qty 15

## 2016-06-08 MED ORDER — LIDOCAINE HCL (PF) 1 % IJ SOLN
INTRAMUSCULAR | Status: AC
  Filled 2016-06-08: qty 5

## 2016-06-08 MED ORDER — ONDANSETRON HCL 4 MG/2ML IJ SOLN
4.0000 mg | Freq: Once | INTRAMUSCULAR | Status: AC | PRN
  Administered 2016-06-08: 4 mg via INTRAVENOUS

## 2016-06-08 MED ORDER — 0.9 % SODIUM CHLORIDE (POUR BTL) OPTIME
TOPICAL | Status: DC | PRN
  Administered 2016-06-08: 1000 mL

## 2016-06-08 SURGICAL SUPPLY — 32 items
BAG HAMPER (MISCELLANEOUS) ×3 IMPLANT
CLOTH BEACON ORANGE TIMEOUT ST (SAFETY) ×3 IMPLANT
COVER LIGHT HANDLE STERIS (MISCELLANEOUS) ×6 IMPLANT
DECANTER SPIKE VIAL GLASS SM (MISCELLANEOUS) ×6 IMPLANT
DRAPE PROXIMA HALF (DRAPES) ×5 IMPLANT
ELECT REM PT RETURN 9FT ADLT (ELECTROSURGICAL) ×3
ELECTRODE REM PT RTRN 9FT ADLT (ELECTROSURGICAL) ×1 IMPLANT
FORMALIN 10 PREFIL 120ML (MISCELLANEOUS) ×3 IMPLANT
GAUZE SPONGE 4X4 12PLY STRL (GAUZE/BANDAGES/DRESSINGS) ×3 IMPLANT
GLOVE BIOGEL M 7.0 STRL (GLOVE) ×2 IMPLANT
GLOVE BIOGEL PI IND STRL 7.0 (GLOVE) IMPLANT
GLOVE BIOGEL PI IND STRL 7.5 (GLOVE) ×1 IMPLANT
GLOVE BIOGEL PI INDICATOR 7.0 (GLOVE) ×2
GLOVE BIOGEL PI INDICATOR 7.5 (GLOVE) ×2
GLOVE ECLIPSE 7.0 STRL STRAW (GLOVE) ×3 IMPLANT
GOWN STRL REUS W/TWL LRG LVL3 (GOWN DISPOSABLE) ×6 IMPLANT
HEMOSTAT SURGICEL 4X8 (HEMOSTASIS) ×3 IMPLANT
KIT ROOM TURNOVER APOR (KITS) ×3 IMPLANT
LIGASURE 5MM LAPAROSCOPIC (INSTRUMENTS) ×2 IMPLANT
LIGASURE IMPACT 36 18CM CVD LR (INSTRUMENTS) ×3 IMPLANT
MANIFOLD NEPTUNE II (INSTRUMENTS) ×3 IMPLANT
NDL HYPO 25X1 1.5 SAFETY (NEEDLE) ×1 IMPLANT
NEEDLE HYPO 25X1 1.5 SAFETY (NEEDLE) ×3 IMPLANT
NS IRRIG 1000ML POUR BTL (IV SOLUTION) ×3 IMPLANT
PACK PERI GYN (CUSTOM PROCEDURE TRAY) ×3 IMPLANT
PAD ARMBOARD 7.5X6 YLW CONV (MISCELLANEOUS) ×3 IMPLANT
SET BASIN LINEN APH (SET/KITS/TRAYS/PACK) ×3 IMPLANT
SUT CHROMIC 3 0 SH 27 (SUTURE) IMPLANT
SUT SILK 0 FSL (SUTURE) ×3 IMPLANT
SYR CONTROL 10ML LL (SYRINGE) ×3 IMPLANT
TUBING SUCTION BULK 100 FT (MISCELLANEOUS) ×2 IMPLANT
YANKAUER SUCT BULB TIP NO VENT (SUCTIONS) ×2 IMPLANT

## 2016-06-08 NOTE — Progress Notes (Signed)
Patient reports she was told she had hives after receiving morphine as a baby, but she states that she has taken Percocet before as an adult with no adverse reactions. Prescription for Percocet was given in this context.  -- Sandra GerlachJason E. Earlene Plateravis, MD, RPVI [place]: [facility] Office #: [phone]

## 2016-06-08 NOTE — Interval H&P Note (Signed)
History and Physical Interval Note:  06/08/2016 7:25 AM  Sandra HomansJessica H Mortensen  has presented today for surgery, with the diagnosis of internal hemorrhoids  The various methods of treatment have been discussed with the patient and family. After consideration of risks, benefits and other options for treatment, the patient has consented to  Procedure(s): SIMPLE HEMORRHOIDECTOMY (N/A) as a surgical intervention .  The patient's history has been reviewed, patient examined, no change in status, stable for surgery.  I have reviewed the patient's chart and labs.  Questions were answered to the patient's satisfaction.     Ancil LinseyJason Evan Chattie Greeson

## 2016-06-08 NOTE — Op Note (Signed)
SURGICAL OPERATIVE REPORT   ATTENDING Surgeon(s): Ancil LinseyJason Evan Christy Ehrsam, MD  ANESTHESIA: GETA  PRE-OPERATIVE DIAGNOSIS: Symptomatic 3rd degree internal hemorrhoid and external hemorrhoid not well-controlled with medical management  POST-OPERATIVE DIAGNOSIS: Symptomatic 3rd degree internal hemorrhoid and external hemorrhoid not well-controlled with medical management  PROCEDURE(S):  1.) Anoscopy 2.) Simple hemorrhectomy (midline anterior internal and posterior external hemorrhoid  INTRAOPERATIVE FINDINGS: Large anterior internal hemorrhoid and posterior external hemorrhoid  INTRAVENOUS FLUIDS: 700 mL crystalloid   ESTIMATED BLOOD LOSS: Minimal (<20 mL)  URINE OUTPUT: No foley   SPECIMENS: Midline anterior internal hemorrhoid and posterior external hemorrhoid  IMPLANTS: None  DRAINS: None   COMPLICATIONS: None apparent   CONDITION AT END OF PROCEDURE: Hemodynamically stable and extubated   DISPOSITION OF PATIENT: PACU  INDICATIONS FOR PROCEDURE:  Patient is a 30 y.o. female who presented with symptomatic 3rd degree internal hemorrhoid and external hemorrhoids not well-controlled with medical management. Patient underwent a recent colonoscopy, which revealed a 3rd degree anterior internal hemorrhoid and external hemorrhoid. All risks, benefits, and alternatives to above procedure were discussed with the patient, all of patient's questions were answered to her expressed satisfaction, and informed consent was obtained and documented.  DETAILS OF PROCEDURE: Patient was brought to the operative suite and appropriately identified. General anesthesia was administered along with appropriate pre-operative antibiotics, and LMA intubation was performed by anesthetist. In high lithotomy position, operative site was prepped and draped in the usual sterile fashion, and following a brief time out, rigid anoscopy was performed with findings of a large anterior internal hemorrhoid and a posterior  external hemorrhoid. Local anesthetic was injected, and three hemorrhoidal pedicles were identified. An Alice clamp was placed near the base of first the anterior internal hemorrhoidal pedicle near the dentate line and retracted externally to exteriorize the hemorrhoidal pedicle. Retracted hemorrhoid was carefully dissected from underlying/adherent tissues, and Ligasure was advanced across the base of the hemorrhoidal pedicle and fired, and the above was repeated for the posterior external hemorrhoid. Hemostasis was confirmed, additional local anesthetic was injected, and lidocaine gel-soaked Surgicel wrapped around 4x4" gauze was inserted into the anal canal for additional hemostasis and pain control.  Patient was then safely able to be extubated, awaken, and transferred to PACU for post-operative monitoring and care.  I was present for all aspects of the above procedure, and there were no complications apparent.

## 2016-06-08 NOTE — Transfer of Care (Signed)
Immediate Anesthesia Transfer of Care Note  Patient: Sandra Glover  Procedure(s) Performed: Procedure(s): SIMPLE HEMORRHOIDECTOMY (N/A)  Patient Location: PACU  Anesthesia Type:General  Level of Consciousness: awake, oriented and patient cooperative  Airway & Oxygen Therapy: Patient Spontanous Breathing and Patient connected to face mask oxygen  Post-op Assessment: Report given to RN and Post -op Vital signs reviewed and stable  Post vital signs: Reviewed and stable  Last Vitals:  Filed Vitals:   06/08/16 0720 06/08/16 0725  BP: 93/55 96/61  Pulse:    Temp:    Resp: 10 12    Last Pain: There were no vitals filed for this visit.    Patients Stated Pain Goal: 7 (06/08/16 0617)  Complications: No apparent anesthesia complications

## 2016-06-08 NOTE — Anesthesia Procedure Notes (Signed)
Procedure Name: LMA Insertion Date/Time: 06/08/2016 7:37 AM Performed by: Pernell DupreADAMS, AMY A Pre-anesthesia Checklist: Patient identified, Timeout performed, Emergency Drugs available, Suction available and Patient being monitored Patient Re-evaluated:Patient Re-evaluated prior to inductionOxygen Delivery Method: Circle system utilized Preoxygenation: Pre-oxygenation with 100% oxygen Intubation Type: IV induction Ventilation: Mask ventilation without difficulty LMA: LMA inserted LMA Size: 4.0 Number of attempts: 1 Placement Confirmation: positive ETCO2 and breath sounds checked- equal and bilateral Tube secured with: Tape Dental Injury: Teeth and Oropharynx as per pre-operative assessment

## 2016-06-08 NOTE — Anesthesia Postprocedure Evaluation (Signed)
Anesthesia Post Note  Patient: Penni HomansJessica H Cockrum  Procedure(s) Performed: Procedure(s) (LRB): SIMPLE HEMORRHOIDECTOMY (N/A)  Patient location during evaluation: PACU Anesthesia Type: General Level of consciousness: awake and alert Pain management: pain level controlled Vital Signs Assessment: post-procedure vital signs reviewed and stable Respiratory status: spontaneous breathing and patient connected to face mask oxygen Cardiovascular status: stable Postop Assessment: no signs of nausea or vomiting Anesthetic complications: no    Last Vitals:  Filed Vitals:   06/08/16 0720 06/08/16 0725  BP: 93/55 96/61  Pulse:    Temp:    Resp: 10 12    Last Pain: There were no vitals filed for this visit.               ADAMS, AMY A

## 2016-06-08 NOTE — Progress Notes (Signed)
Rectal packing d/c'd intact. No active bleeding or drainage. Tolerated well.

## 2016-06-08 NOTE — Anesthesia Preprocedure Evaluation (Signed)
Anesthesia Evaluation  Patient identified by MRN, date of birth, ID band Patient awake    Reviewed: Allergy & Precautions, NPO status , Patient's Chart, lab work & pertinent test results  Airway Mallampati: II  TM Distance: >3 FB     Dental  (+) Teeth Intact   Pulmonary neg pulmonary ROS,    breath sounds clear to auscultation       Cardiovascular negative cardio ROS   Rhythm:Regular Rate:Normal     Neuro/Psych PSYCHIATRIC DISORDERS Anxiety Depression    GI/Hepatic   Endo/Other  diabetes  Renal/GU      Musculoskeletal   Abdominal   Peds  Hematology   Anesthesia Other Findings   Reproductive/Obstetrics                             Anesthesia Physical Anesthesia Plan  ASA: II  Anesthesia Plan: General   Post-op Pain Management:    Induction: Intravenous  Airway Management Planned: LMA  Additional Equipment:   Intra-op Plan:   Post-operative Plan: Extubation in OR  Informed Consent: I have reviewed the patients History and Physical, chart, labs and discussed the procedure including the risks, benefits and alternatives for the proposed anesthesia with the patient or authorized representative who has indicated his/her understanding and acceptance.     Plan Discussed with:   Anesthesia Plan Comments:         Anesthesia Quick Evaluation

## 2016-06-08 NOTE — Discharge Instructions (Signed)
In addition to included general post-operative instructions for Hemorrhoidectomy,  Diet: Resume home heart healthy diet.   Activity: No heavy lifting (children, pets, laundry) or strenuous activity until follow-up, but light activity and walking are encouraged. Do not drive or drink alcohol if taking narcotic pain medications.   Wound care: May shower with water/soapy water, shallow hot water baths +/- salt water may also help to provide pain relief   Medications: Resume all home medications. For mild to moderate pain: acetaminophen (Tylenol) or ibuprofen (if no kidney disease). Narcotic pain medications, if prescribed, can be used for severe pain, though may cause nausea, constipation, and drowsiness. Do not combine Tylenol and Percocet within a 6 hour period as Percocet contains Tylenol.  Call office [phone]) at any time if any questions, worsening pain, fevers/chills, bleeding, drainage from incision site, or other concerns.

## 2016-06-14 ENCOUNTER — Ambulatory Visit: Payer: BLUE CROSS/BLUE SHIELD | Admitting: Women's Health

## 2016-06-16 ENCOUNTER — Encounter (HOSPITAL_COMMUNITY): Payer: Self-pay | Admitting: Surgery

## 2016-06-22 ENCOUNTER — Encounter (HOSPITAL_COMMUNITY): Payer: Self-pay | Admitting: *Deleted

## 2016-06-22 ENCOUNTER — Ambulatory Visit (INDEPENDENT_AMBULATORY_CARE_PROVIDER_SITE_OTHER): Payer: BLUE CROSS/BLUE SHIELD | Admitting: Psychiatry

## 2016-06-22 DIAGNOSIS — F331 Major depressive disorder, recurrent, moderate: Secondary | ICD-10-CM

## 2016-06-22 NOTE — Patient Instructions (Signed)
Discussed orally 
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
## 2016-07-05 ENCOUNTER — Telehealth (HOSPITAL_COMMUNITY): Payer: Self-pay | Admitting: *Deleted

## 2016-07-05 ENCOUNTER — Ambulatory Visit (HOSPITAL_COMMUNITY): Payer: Self-pay | Admitting: Psychiatry

## 2016-07-05 NOTE — Telephone Encounter (Signed)
Pt was scheduled to f/u with provider 07-05-16 due was resch due to provider being out of office. Pt need refills for her Abilify QD 04-04-16 30 tabs 2 refills, Klonopin BID 04-04-16 60 tabs 2 refills, Prozac QD 04-04-16 30 tabs 2 refills and Trazodone QHS 04-04-16 30 tabs 2 refills. Pt pharmacy number is [phone].

## 2016-07-06 ENCOUNTER — Ambulatory Visit (HOSPITAL_COMMUNITY): Payer: Self-pay | Admitting: Psychiatry

## 2016-07-06 ENCOUNTER — Other Ambulatory Visit (HOSPITAL_COMMUNITY): Payer: Self-pay | Admitting: Psychiatry

## 2016-07-06 MED ORDER — TRAZODONE HCL 50 MG PO TABS: 50.0000 mg | ORAL_TABLET | Freq: Every day | ORAL | 2 refills | Status: DC

## 2016-07-06 MED ORDER — FLUOXETINE HCL 40 MG PO CAPS: 40.0000 mg | ORAL_CAPSULE | Freq: Every day | ORAL | 2 refills | Status: DC

## 2016-07-06 MED ORDER — ARIPIPRAZOLE 2 MG PO TABS: 2.0000 mg | ORAL_TABLET | Freq: Every day | ORAL | 2 refills | Status: DC

## 2016-07-06 NOTE — Telephone Encounter (Signed)
You may call in clonazepam, other sent

## 2016-07-11 ENCOUNTER — Telehealth (HOSPITAL_COMMUNITY): Payer: Self-pay | Admitting: *Deleted

## 2016-07-11 MED ORDER — CLONAZEPAM 1 MG PO TABS: 1.0000 mg | ORAL_TABLET | Freq: Two times a day (BID) | ORAL | 0 refills | Status: DC | PRN

## 2016-07-11 NOTE — Telephone Encounter (Signed)
Per Dr. Tenny Crawoss to call in pt Klonopin to her pharmacy. Called pt pharmacy and spoke with Harrold DonathNathan. Per Harrold DonathNathan they will get pt medication ready.

## 2016-07-11 NOTE — Telephone Encounter (Signed)
Called in pt medication and spoke with Nathan 
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
## 2016-07-26 ENCOUNTER — Ambulatory Visit (INDEPENDENT_AMBULATORY_CARE_PROVIDER_SITE_OTHER): Payer: BLUE CROSS/BLUE SHIELD | Admitting: Obstetrics & Gynecology

## 2016-07-26 ENCOUNTER — Encounter: Payer: Self-pay | Admitting: Obstetrics & Gynecology

## 2016-07-26 VITALS — BP 120/70 | HR 80 | Ht 63.0 in | Wt 167.0 lb

## 2016-07-26 DIAGNOSIS — B373 Candidiasis of vulva and vagina: Secondary | ICD-10-CM | POA: Diagnosis not present

## 2016-07-26 DIAGNOSIS — N898 Other specified noninflammatory disorders of vagina: Secondary | ICD-10-CM | POA: Diagnosis not present

## 2016-07-26 DIAGNOSIS — B3731 Acute candidiasis of vulva and vagina: Secondary | ICD-10-CM

## 2016-07-26 MED ORDER — TERCONAZOLE 0.4 % VA CREA: 1.0000 | TOPICAL_CREAM | Freq: Every day | VAGINAL | 0 refills | Status: DC

## 2016-07-26 NOTE — Progress Notes (Signed)
       Chief Complaint  Patient presents with  . Vaginal Discharge    white in color    Blood pressure 120/70, pulse 80, height 5\' 3"  (1.6 m), weight 167 lb (75.8 kg), last menstrual period 07/13/2016, not currently breastfeeding.  30 y.o. [id number] Patient's last menstrual period was 07/13/2016. The current method of family planning is Nexplanon. Patient states she has had these recurrently since the time she began her cycles and has had Diflucan and over-the-counter Monistat she uses an almost a monthly basis right before and after her period She has not been on any antibiotics and she is not diabetic Subjective Vaginal discharge for 1days white curd like Itching yes Irritation yes Odor no Similar to previous yes  Previous treatment Diflucan and monistat  Objective Vulva:  normal appearing vulva with no masses, tenderness or lesions Vagina:  normal mucosa, curd-like discharge Cervix:  no cervical motion tenderness and no lesions Uterus:   Adnexa: ovaries:,       Pertinent ROS Per HPI No burning with urination, frequency or urgency No nausea, vomiting or diarrhea Nor fever chills or other constitutional symptoms    Labs or studies      Impression Diagnoses this Encounter::   ICD-9-CM ICD-10-CM   1. Yeast vaginitis 112.1 B37.3     Established relevant diagnosis(es): Recurrent, associated with menses  Plan/Recommendations: Meds ordered this encounter  Medications  . terconazole (TERAZOL 7) 0.4 % vaginal cream    Sig: Place 1 applicator vaginally at bedtime.    Dispense:  45 g    Refill:  0    Labs or Scans Ordered: No orders of the defined types were placed in this encounter.   Management:: Painted vagina and vulva with gentian violet Recommend using Jarrow Femdophilus as a vagianl health probiotic  Follow up Return in about 1 month (around 08/26/2016) for Follow up, with Dr Despina HiddenEure.      All questions were answered.

## 2016-08-04 ENCOUNTER — Ambulatory Visit (INDEPENDENT_AMBULATORY_CARE_PROVIDER_SITE_OTHER): Payer: BLUE CROSS/BLUE SHIELD | Admitting: Psychiatry

## 2016-08-04 ENCOUNTER — Encounter (HOSPITAL_COMMUNITY): Payer: Self-pay | Admitting: *Deleted

## 2016-08-04 ENCOUNTER — Encounter (HOSPITAL_COMMUNITY): Payer: Self-pay | Admitting: Psychiatry

## 2016-08-04 VITALS — BP 114/65 | HR 71 | Ht 63.0 in | Wt 167.2 lb

## 2016-08-04 DIAGNOSIS — F332 Major depressive disorder, recurrent severe without psychotic features: Secondary | ICD-10-CM | POA: Diagnosis not present

## 2016-08-04 DIAGNOSIS — F331 Major depressive disorder, recurrent, moderate: Secondary | ICD-10-CM

## 2016-08-04 MED ORDER — CLONAZEPAM 1 MG PO TABS: 1.0000 mg | ORAL_TABLET | Freq: Two times a day (BID) | ORAL | 2 refills | Status: DC | PRN

## 2016-08-04 MED ORDER — ARIPIPRAZOLE 2 MG PO TABS: 2.0000 mg | ORAL_TABLET | Freq: Every day | ORAL | 2 refills | Status: DC

## 2016-08-04 MED ORDER — TRAZODONE HCL 50 MG PO TABS: 50.0000 mg | ORAL_TABLET | Freq: Every day | ORAL | 2 refills | Status: DC

## 2016-08-04 MED ORDER — FLUOXETINE HCL 40 MG PO CAPS: 40.0000 mg | ORAL_CAPSULE | Freq: Every day | ORAL | 2 refills | Status: DC
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
## 2016-08-25 ENCOUNTER — Ambulatory Visit: Payer: BLUE CROSS/BLUE SHIELD | Admitting: Obstetrics & Gynecology

## 2016-09-19 ENCOUNTER — Ambulatory Visit (INDEPENDENT_AMBULATORY_CARE_PROVIDER_SITE_OTHER): Payer: BLUE CROSS/BLUE SHIELD | Admitting: Nurse Practitioner

## 2016-09-19 ENCOUNTER — Encounter: Payer: Self-pay | Admitting: Nurse Practitioner

## 2016-09-19 VITALS — BP 112/70 | HR 75 | Temp 97.8°F | Ht 63.0 in | Wt 168.8 lb

## 2016-09-19 DIAGNOSIS — K5901 Slow transit constipation: Secondary | ICD-10-CM

## 2016-09-19 DIAGNOSIS — K649 Unspecified hemorrhoids: Secondary | ICD-10-CM | POA: Diagnosis not present

## 2016-09-19 NOTE — Assessment & Plan Note (Signed)
Patient with internal and external hemorrhoids, as large as grade 3. Colonoscopy completed, repeat at age 30. She was referred to surgery who performed a simple hemorrhoidectomy. She is currently asymptomatic at this point from her hemorrhoids. No longer using apothecary cream. Return for follow-up as needed for any recurrent symptoms. I will ensure the patient is on the recall list for screening colonoscopy.

## 2016-09-19 NOTE — Patient Instructions (Signed)
1. Keep taking Linzess for constipation. 2. We will switch you on a recall list so that you'll receive a letter when it is time for your next colonoscopy. 3. Return for follow-up as needed. Call us with any worsening or recurrent symptoms.

## 2016-09-19 NOTE — Progress Notes (Signed)
Referring Provider: Aniceto BossSettle, Paul C, MD Primary Care Physician:  Aniceto BossSETTLE,PAUL C, MD Primary GI:  Dr. Darrick PennaFields  Chief Complaint  Patient presents with  . Follow-up    HPI:   Sandra Glover is a 30 y.o. female who presents For postprocedure follow-up. The patient was last seen in our office 03/24/2016 for rectal bleeding and constipation. Notable internal hemorrhoid grade 3. Recommended increased water, high-fiber diet, add Linzess, apothecary hemorrhoid cream 4 times a day for 2 weeks, schedule colonoscopy with possible banding on propofol.  Colonoscopy completed 05/17/2016 which found normal ileum, moderately redundant colon, rectal bleeding/pain due to nonbleeding external and internal hemorrhoids. Recommended high-fiber diet, continue current medications, repeat colonoscopy age 30, pop the [place] hemorrhoid cream 2-4 times a day for no more than 10 days in a row. Recommended surgical referral for hemorrhoids. Four-month follow-up in our office.  The patient's all surgery 06/08/2016 for simple hemorrhoidectomy which was completed without complication.  Today she states she's doing well overall. Denies abdominal pain, N/V, hematochezia, melena. No longer using Apothecary cream and hemorrhoid symptoms have resolved after surgery. Constipation resolved on Linzess.  Denies chest pain, dyspnea, dizziness, lightheadedness, syncope, near syncope. Denies any other upper or lower GI symptoms.  Past Medical History:  Diagnosis Date  . Anxiety   . Depression   . Gestational diabetes mellitus, antepartum    gestational only  . History of anemia 2013  . History of depression    states stopped med. in 2013  . 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 Pertussis   . Postcoital bleeding 07/14/2015  . Sinus infection 01/25/2013   started antibiotic 01/24/2013 x 10 days; nasal congestion, clear drainage from nose  . Tonsillar and adenoid hypertrophy 01/2013  .  Vaginal irritation 03/25/2014   Mild yeast but has history of yeast, will not treat til after first trimester,try yogurt    Past Surgical History:  Procedure Laterality Date  . CHOLECYSTECTOMY  04/23/2010   laparoscopic  . COLONOSCOPY WITH PROPOFOL N/A 05/17/2016   Procedure: COLONOSCOPY WITH PROPOFOL;  Surgeon: West BaliSandi L Fields, MD;  Location: [facility];  Service: Endoscopy;  Laterality: N/A;  1000  . HEMORRHOID BANDING N/A 05/17/2016   Procedure: HEMORRHOID BANDING;  Surgeon: West BaliSandi L Fields, MD;  Location: [facility];  Service: Endoscopy;  Laterality: N/A;  . HEMORRHOID SURGERY N/A 06/08/2016   Procedure: SIMPLE HEMORRHOIDECTOMY;  Surgeon: Ancil LinseyJason Evan Davis, MD;  Location: [facility];  Service: General;  Laterality: N/A;  . PARTIAL NEPHRECTOMY Right age 30 mos.  . TONSILLECTOMY AND ADENOIDECTOMY N/A 01/29/2013   Procedure: TONSILLECTOMY AND ADENOIDECTOMY;  Surgeon: Darletta MollSui W Teoh, MD;  Location: [facility];  Service: ENT;  Laterality: N/A;    Current Outpatient Prescriptions  Medication Sig Dispense Refill  . ARIPiprazole (ABILIFY) 2 MG tablet Take 1 tablet (2 mg total) by mouth daily. 30 tablet 2  . clonazePAM (KLONOPIN) 1 MG tablet Take 1 tablet (1 mg total) by mouth 2 (two) times daily as needed for anxiety. 60 tablet 2  . etonogestrel (NEXPLANON) 68 MG IMPL implant 1 each by Subdermal route once.    Marland Kitchen. FLUoxetine (PROZAC) 40 MG capsule Take 1 capsule (40 mg total) by mouth daily. 30 capsule 2  . ibuprofen (ADVIL,MOTRIN) 800 MG tablet Take 1 tablet (800 mg total) by mouth 3 (three) times daily. 21 tablet 0  . linaclotide (LINZESS) 145 MCG CAPS capsule 1 PO 30  mins prior to your first meal (Patient taking differently: Take 145 mcg by mouth daily before breakfast. 1 PO 30 mins prior to your first meal) 30 capsule 11  . terconazole (TERAZOL 7) 0.4 % vaginal cream Place 1 applicator vaginally at bedtime. 45 g 0  . traZODone (DESYREL) 50 MG tablet Take 1 tablet (50 mg total) by mouth  at bedtime. 30 tablet 2   No current facility-administered medications for this visit.     Allergies as of 09/19/2016 - Review Complete 09/19/2016  Allergen Reaction Noted  . Lortab [hydrocodone-acetaminophen] Nausea And Vomiting 01/29/2013  . Morphine and related Rash 01/25/2013    Family History  Problem Relation Age of Onset  . COPD Mother   . Depression Mother   . Anxiety disorder Mother   . Hypertension Mother   . Irritable bowel syndrome Mother   . Arthritis Mother   . Skin cancer Mother   . Ovarian cancer Maternal Aunt   . Ovarian cancer Maternal Grandmother   . Hypertension Father   . GER disease Father   . Bipolar disorder Paternal Uncle   . Anxiety disorder Cousin   . Drug abuse Cousin   . Bipolar disorder Cousin   . Colon cancer Neg Hx     Social History   Social History  . Marital status: Married    Spouse name: N/A  . Number of children: N/A  . Years of education: N/A   Social History Main Topics  . Smoking status: Never Smoker  . Smokeless tobacco: Never Used     Comment: Never smoked  . Alcohol use 0.0 oz/week     Comment: socially  . Drug use: No  . Sexual activity: Yes    Birth control/ protection: None, Implant     Comment: nexplanon implant   Other Topics Concern  . None   Social History Narrative  . None    Review of Systems: Complete ROS negative except as per HPI.   Physical Exam: BP 112/70   Pulse 75   Temp 97.8 F (36.6 C) (Oral)   Ht 5\' 3"  (1.6 m)   Wt 168 lb 12.8 oz (76.6 kg)   BMI 29.90 kg/m  General:   Alert and oriented. Pleasant and cooperative. Well-nourished and well-developed.  Ears:  Normal auditory acuity. Cardiovascular:  S1, S2 present without murmurs appreciated. Extremities without clubbing or edema. Respiratory:  Clear to auscultation bilaterally. No wheezes, rales, or rhonchi. No distress.  Gastrointestinal:  +BS, rounded but soft, non-tender and non-distended. No HSM noted. No guarding or rebound. No  masses appreciated.  Rectal:  Deferred - asymptomatic at this time.  Musculoskalatal:  Symmetrical without gross deformities. Neurologic:  Alert and oriented x4;  grossly normal neurologically. Psych:  Alert and cooperative. Normal mood and affect. Heme/Lymph/Immune: No excessive bruising noted.    09/19/2016 2:42 PM   Disclaimer: This note was dictated with voice recognition software. Similar sounding words can inadvertently be transcribed and may not be corrected upon review.

## 2016-09-19 NOTE — Assessment & Plan Note (Signed)
Constipation much improved on Linzess 145 g a day. Recommend she continue Linzess. Return for follow-up as needed for any recurrent symptoms.

## 2016-10-19 ENCOUNTER — Ambulatory Visit (INDEPENDENT_AMBULATORY_CARE_PROVIDER_SITE_OTHER): Payer: BLUE CROSS/BLUE SHIELD | Admitting: Psychiatry

## 2016-10-19 ENCOUNTER — Encounter (HOSPITAL_COMMUNITY): Payer: Self-pay | Admitting: Psychiatry

## 2016-10-19 VITALS — BP 113/74 | HR 63 | Ht 63.0 in | Wt 169.6 lb

## 2016-10-19 DIAGNOSIS — Z79899 Other long term (current) drug therapy: Secondary | ICD-10-CM

## 2016-10-19 DIAGNOSIS — Z808 Family history of malignant neoplasm of other organs or systems: Secondary | ICD-10-CM

## 2016-10-19 DIAGNOSIS — Z818 Family history of other mental and behavioral disorders: Secondary | ICD-10-CM | POA: Diagnosis not present

## 2016-10-19 DIAGNOSIS — Z8249 Family history of ischemic heart disease and other diseases of the circulatory system: Secondary | ICD-10-CM

## 2016-10-19 DIAGNOSIS — F331 Major depressive disorder, recurrent, moderate: Secondary | ICD-10-CM | POA: Diagnosis not present

## 2016-10-19 DIAGNOSIS — Z888 Allergy status to other drugs, medicaments and biological substances status: Secondary | ICD-10-CM | POA: Diagnosis not present

## 2016-10-19 DIAGNOSIS — Z8261 Family history of arthritis: Secondary | ICD-10-CM

## 2016-10-19 DIAGNOSIS — Z8041 Family history of malignant neoplasm of ovary: Secondary | ICD-10-CM

## 2016-10-19 DIAGNOSIS — Z813 Family history of other psychoactive substance abuse and dependence: Secondary | ICD-10-CM

## 2016-10-19 MED ORDER — CLONAZEPAM 1 MG PO TABS
1.0000 mg | ORAL_TABLET | Freq: Two times a day (BID) | ORAL | 2 refills | Status: DC | PRN
Start: 2016-10-19 — End: 2017-01-19

## 2016-10-19 MED ORDER — TRAZODONE HCL 50 MG PO TABS: 50.0000 mg | ORAL_TABLET | Freq: Every day | ORAL | 2 refills | Status: DC

## 2016-10-19 MED ORDER — FLUOXETINE HCL 40 MG PO CAPS: 40.0000 mg | ORAL_CAPSULE | Freq: Every day | ORAL | 2 refills | Status: DC

## 2016-10-19 MED ORDER — ARIPIPRAZOLE 2 MG PO TABS: 2.0000 mg | ORAL_TABLET | Freq: Every day | ORAL | 2 refills | Status: DC
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
## 2016-11-03 ENCOUNTER — Ambulatory Visit (HOSPITAL_COMMUNITY): Payer: Self-pay | Admitting: Psychiatry

## 2017-01-19 ENCOUNTER — Encounter (HOSPITAL_COMMUNITY): Payer: Self-pay | Admitting: Psychiatry

## 2017-01-19 ENCOUNTER — Ambulatory Visit (INDEPENDENT_AMBULATORY_CARE_PROVIDER_SITE_OTHER): Payer: BLUE CROSS/BLUE SHIELD | Admitting: Psychiatry

## 2017-01-19 VITALS — BP 109/74 | HR 74 | Ht 63.0 in | Wt 175.8 lb

## 2017-01-19 DIAGNOSIS — Z808 Family history of malignant neoplasm of other organs or systems: Secondary | ICD-10-CM

## 2017-01-19 DIAGNOSIS — Z888 Allergy status to other drugs, medicaments and biological substances status: Secondary | ICD-10-CM | POA: Diagnosis not present

## 2017-01-19 DIAGNOSIS — Z8249 Family history of ischemic heart disease and other diseases of the circulatory system: Secondary | ICD-10-CM

## 2017-01-19 DIAGNOSIS — Z8489 Family history of other specified conditions: Secondary | ICD-10-CM

## 2017-01-19 DIAGNOSIS — Z79899 Other long term (current) drug therapy: Secondary | ICD-10-CM | POA: Diagnosis not present

## 2017-01-19 DIAGNOSIS — F331 Major depressive disorder, recurrent, moderate: Secondary | ICD-10-CM

## 2017-01-19 DIAGNOSIS — Z8041 Family history of malignant neoplasm of ovary: Secondary | ICD-10-CM

## 2017-01-19 DIAGNOSIS — Z818 Family history of other mental and behavioral disorders: Secondary | ICD-10-CM

## 2017-01-19 MED ORDER — ARIPIPRAZOLE 2 MG PO TABS: 2.0000 mg | ORAL_TABLET | Freq: Every day | ORAL | 2 refills | Status: DC

## 2017-01-19 MED ORDER — TRAZODONE HCL 50 MG PO TABS: 50.0000 mg | ORAL_TABLET | Freq: Every day | ORAL | 2 refills | Status: DC

## 2017-01-19 MED ORDER — CLONAZEPAM 1 MG PO TABS: 1.0000 mg | ORAL_TABLET | Freq: Two times a day (BID) | ORAL | 2 refills | Status: DC | PRN

## 2017-01-19 MED ORDER — FLUOXETINE HCL 40 MG PO CAPS: 40.0000 mg | ORAL_CAPSULE | Freq: Every day | ORAL | 2 refills | Status: DC
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
## 2017-01-25 ENCOUNTER — Other Ambulatory Visit (HOSPITAL_COMMUNITY): Payer: Self-pay | Admitting: Psychiatry

## 2017-03-24 ENCOUNTER — Encounter: Payer: Self-pay | Admitting: Obstetrics & Gynecology

## 2017-03-24 ENCOUNTER — Other Ambulatory Visit (INDEPENDENT_AMBULATORY_CARE_PROVIDER_SITE_OTHER): Payer: BLUE CROSS/BLUE SHIELD

## 2017-03-24 ENCOUNTER — Other Ambulatory Visit: Payer: Self-pay | Admitting: Obstetrics & Gynecology

## 2017-03-24 ENCOUNTER — Ambulatory Visit (INDEPENDENT_AMBULATORY_CARE_PROVIDER_SITE_OTHER): Payer: BLUE CROSS/BLUE SHIELD | Admitting: Obstetrics & Gynecology

## 2017-03-24 VITALS — BP 112/72 | HR 80 | Wt 181.0 lb

## 2017-03-24 DIAGNOSIS — N8311 Corpus luteum cyst of right ovary: Secondary | ICD-10-CM

## 2017-03-24 DIAGNOSIS — Z1389 Encounter for screening for other disorder: Secondary | ICD-10-CM

## 2017-03-24 DIAGNOSIS — R1032 Left lower quadrant pain: Secondary | ICD-10-CM | POA: Diagnosis not present

## 2017-03-24 LAB — POCT URINALYSIS DIPSTICK
Blood, UA: NEGATIVE
Glucose, UA: NEGATIVE
Ketones, UA: NEGATIVE
Leukocytes, UA: NEGATIVE
Nitrite, UA: NEGATIVE
Protein, UA: NEGATIVE

## 2017-03-24 MED ORDER — KETOROLAC TROMETHAMINE 10 MG PO TABS: 10.0000 mg | ORAL_TABLET | Freq: Three times a day (TID) | ORAL | 0 refills | Status: DC | PRN

## 2017-03-24 NOTE — Progress Notes (Signed)
PELVIC US TA/TV: Homogeneous anteverted uterus,normal left ovary,hemorrhagic right ovarian cyst 2.8 x 2.8 x 2.1 cm  w/arterial and venous flow,no free fluid,bilat adnexal discomfort,EEC 3.7 mm

## 2017-03-24 NOTE — Progress Notes (Signed)
Follow up appointment for results  Chief Complaint  Patient presents with  . stabbing pain left ovary    Blood pressure 112/72, pulse 80, weight 181 lb (82.1 kg), last menstrual period 02/28/2017.  US Transvaginal Non-ob  Result Date: 03/24/2017 GYNECOLOGIC SONOGRAM Sandra Glover is a 31 y.o. [id number] LMP 02/28/2017 for a pelvic sonogram for LLQ pain. Uterus                      8.7 x 3.5 x 4.3 cm, homogeneous anteverted uterus,WNL Endometrium          3.7 mm, symmetrical, wnl Right ovary             3.2 x 2.5 x 3.1 cm, hemorrhagic right ovarian cyst 2.8 x 2.8 x 2.1 cm  w/arterial and venous flow Left ovary                1.7 x 1.8 x 1.8 cm, wnl No free fluid Technician Comments: PELVIC US TA/TV: Homogeneous anteverted uterus,normal left ovary,hemorrhagic right ovarian cyst 2.8 x 2.8 x 2.1 cm  w/arterial and venous flow,no free fluid,bilat adnexal discomfort,EEC 3.7 mm [name] 03/24/2017 11:08 AM Clinical Impression and recommendations: I have reviewed the sonogram results above, combined with the patient's current clinical course, below are my impressions and any appropriate recommendations for management based on the sonographic findings. Normal uterus and endometrium is thin, consistent with Nexplanon Right ovary with physiologic cyst, she does have light regular menses on the nexplanon Left ovary with any cysts or pathology No anatomical source of LLQ pain is identified Lantz Hermann H 03/24/2017 11:11 AM   US Pelvis Complete  Result Date: 03/24/2017 GYNECOLOGIC SONOGRAM Sandra Glover is a 31 y.o. [id number] LMP 02/28/2017 for a pelvic sonogram for LLQ pain. Uterus                      8.7 x 3.5 x 4.3 cm, homogeneous anteverted uterus,WNL Endometrium          3.7 mm, symmetrical, wnl Right ovary             3.2 x 2.5 x 3.1 cm, hemorrhagic right ovarian cyst 2.8 x 2.8 x 2.1 cm  w/arterial and venous flow Left ovary                1.7 x 1.8 x 1.8 cm, wnl No free fluid Technician Comments:  PELVIC US TA/TV: Homogeneous anteverted uterus,normal left ovary,hemorrhagic right ovarian cyst 2.8 x 2.8 x 2.1 cm  w/arterial and venous flow,no free fluid,bilat adnexal discomfort,EEC 3.7 mm [name] 03/24/2017 11:08 AM Clinical Impression and recommendations: I have reviewed the sonogram results above, combined with the patient's current clinical course, below are my impressions and any appropriate recommendations for management based on the sonographic findings. Normal uterus and endometrium is thin, consistent with Nexplanon Right ovary with physiologic cyst, she does have light regular menses on the nexplanon Left ovary with any cysts or pathology No anatomical source of LLQ pain is identified Dominique Calvey H 03/24/2017 11:11 AM   [place] Art/ven Flow Abd Pelv Doppler Limited  Result Date: 03/24/2017 GYNECOLOGIC SONOGRAM Sandra Glover is a 31 y.o. [id number] LMP 02/28/2017 for a pelvic sonogram for LLQ pain. Uterus                      8.7 x 3.5 x 4.3 cm, homogeneous anteverted uterus,WNL Endometrium  3.7 mm, symmetrical, wnl Right ovary             3.2 x 2.5 x 3.1 cm, hemorrhagic right ovarian cyst 2.8 x 2.8 x 2.1 cm  w/arterial and venous flow Left ovary                1.7 x 1.8 x 1.8 cm, wnl No free fluid Technician Comments: PELVIC US TA/TV: Homogeneous anteverted uterus,normal left ovary,hemorrhagic right ovarian cyst 2.8 x 2.8 x 2.1 cm  w/arterial and venous flow,no free fluid,bilat adnexal discomfort,EEC 3.7 mm [name] 03/24/2017 11:08 AM Clinical Impression and recommendations: I have reviewed the sonogram results above, combined with the patient's current clinical course, below are my impressions and any appropriate recommendations for management based on the sonographic findings. Normal uterus and endometrium is thin, consistent with Nexplanon Right ovary with physiologic cyst, she does have light regular menses on the nexplanon Left ovary with any cysts or pathology No anatomical source  of LLQ pain is identified Sandra Glover H 03/24/2017 11:11 AM    Sudden onset of LLQ pain this am, no bleeding no fever no back pain no urinary symptoms No history of kidney stones, never had similar pain No NV diarrhea no constipation  General WDWN female NAD Vulva:  normal appearing vulva with no masses, tenderness or lesions Vagina:  normal mucosa, no discharge Cervix:  no cervical motion tenderness and no lesions Uterus:  normal size, contour, position, consistency, mobility, non-tender Adnexa: ovaries:present,  normal adnexa in size, nontender and no masses   MEDS ordered this encounter: Meds ordered this encounter  Medications  . ketorolac (TORADOL) 10 MG tablet    Sig: Take 1 tablet (10 mg total) by mouth every 8 (eight) hours as needed.    Dispense:  15 tablet    Refill:  0    Orders for this encounter: Orders Placed This Encounter  Procedures  . US Pelvis Complete  . US Transvaginal Non-OB  . POCT urinalysis dipstick    Impression: 1. Left lower quadrant pain  - US Pelvis Complete; Future - US Transvaginal Non-OB; Future  2. Screening for genitourinary condition  - POCT urinalysis dipstick  3.  Corpus luteum of right ovary   Plan: No specific source of pain is noted, in such circumstances would generally lean toward a musculoskeletal source although exam is unremarkable and non contriubutory  Will try a course of toradol  Follow Up: Return if symptoms worsen or fail to improve.      All questions were answered.  Past Medical History:  Diagnosis Date  . Anxiety   . Depression   . Gestational diabetes mellitus, antepartum    gestational only  . History of anemia 2013  . History of depression    states stopped med. in 2013  . 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 Pertussis   . Postcoital bleeding 07/14/2015  . Sinus infection 01/25/2013   started antibiotic 01/24/2013 x 10 days; nasal congestion, clear  drainage from nose  . Tonsillar and adenoid hypertrophy 01/2013  . Vaginal irritation 03/25/2014   Mild yeast but has history of yeast, will not treat til after first trimester,try yogurt    Past Surgical History:  Procedure Laterality Date  . CHOLECYSTECTOMY  04/23/2010   laparoscopic  . COLONOSCOPY WITH PROPOFOL N/A 05/17/2016   Procedure: COLONOSCOPY WITH PROPOFOL;  Surgeon: West Bali, MD;  Location: [facility];  Service: Endoscopy;  Laterality: N/A;  1000  . HEMORRHOID BANDING N/A 05/17/2016   Procedure: HEMORRHOID BANDING;  Surgeon: West Bali, MD;  Location: [facility];  Service: Endoscopy;  Laterality: N/A;  . HEMORRHOID SURGERY N/A 06/08/2016   Procedure: SIMPLE HEMORRHOIDECTOMY;  Surgeon: Ancil Linsey, MD;  Location: [facility];  Service: General;  Laterality: N/A;  . PARTIAL NEPHRECTOMY Right age 52 mos.  . TONSILLECTOMY AND ADENOIDECTOMY N/A 01/29/2013   Procedure: TONSILLECTOMY AND ADENOIDECTOMY;  Surgeon: Darletta Moll, MD;  Location: [facility];  Service: ENT;  Laterality: N/A;    OB History    Gravida Para Term Preterm AB Living   SAB TAB Ectopic Multiple Live Births           2      Allergies  Allergen Reactions  . Lortab [Hydrocodone-Acetaminophen] Nausea And Vomiting  . Morphine And Related Rash    Social History   Social History  . Marital status: Married    Spouse name: N/A  . Number of children: N/A  . Years of education: N/A   Social History Main Topics  . Smoking status: Never Smoker  . Smokeless tobacco: Never Used     Comment: Never smoked  . Alcohol use 0.0 oz/week     Comment: socially  . Drug use: No  . Sexual activity: Yes    Birth control/ protection: None, Implant     Comment: nexplanon implant   Other Topics Concern  . None   Social History Narrative  . None    Family History  Problem Relation Age of Onset  . COPD Mother   . Depression Mother   . Anxiety disorder Mother   . Hypertension  Mother   . Irritable bowel syndrome Mother   . Arthritis Mother   . Skin cancer Mother   . Ovarian cancer Maternal Aunt   . Ovarian cancer Maternal Grandmother   . Hypertension Father   . GER disease Father   . Bipolar disorder Paternal Uncle   . Anxiety disorder Cousin   . Drug abuse Cousin   . Bipolar disorder Cousin   . Colon cancer Neg Hx

## 2017-04-17 ENCOUNTER — Other Ambulatory Visit: Payer: Self-pay | Admitting: Gastroenterology

## 2017-04-18 ENCOUNTER — Encounter (HOSPITAL_COMMUNITY): Payer: Self-pay | Admitting: *Deleted

## 2017-04-18 ENCOUNTER — Encounter (HOSPITAL_COMMUNITY): Payer: Self-pay | Admitting: Psychiatry

## 2017-04-18 ENCOUNTER — Ambulatory Visit (INDEPENDENT_AMBULATORY_CARE_PROVIDER_SITE_OTHER): Payer: BLUE CROSS/BLUE SHIELD | Admitting: Psychiatry

## 2017-04-18 VITALS — BP 110/64 | HR 82 | Ht 63.0 in | Wt 180.6 lb

## 2017-04-18 DIAGNOSIS — F331 Major depressive disorder, recurrent, moderate: Secondary | ICD-10-CM

## 2017-04-18 DIAGNOSIS — Z811 Family history of alcohol abuse and dependence: Secondary | ICD-10-CM

## 2017-04-18 DIAGNOSIS — Z818 Family history of other mental and behavioral disorders: Secondary | ICD-10-CM

## 2017-04-18 MED ORDER — CLONAZEPAM 1 MG PO TABS: 1.0000 mg | ORAL_TABLET | Freq: Two times a day (BID) | ORAL | 2 refills | Status: DC | PRN

## 2017-04-18 MED ORDER — TRAZODONE HCL 50 MG PO TABS: 50.0000 mg | ORAL_TABLET | Freq: Every day | ORAL | 2 refills | Status: DC

## 2017-04-18 MED ORDER — ARIPIPRAZOLE 2 MG PO TABS: 2.0000 mg | ORAL_TABLET | Freq: Every day | ORAL | 2 refills | Status: DC

## 2017-04-18 MED ORDER — FLUOXETINE HCL 40 MG PO CAPS: 40.0000 mg | ORAL_CAPSULE | Freq: Every day | ORAL | 2 refills | Status: DC
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
## 2017-05-04 ENCOUNTER — Other Ambulatory Visit: Payer: Self-pay | Admitting: Obstetrics & Gynecology

## 2017-05-04 ENCOUNTER — Telehealth: Payer: Self-pay | Admitting: *Deleted

## 2017-05-04 MED ORDER — FLUCONAZOLE 150 MG PO TABS: 150.0000 mg | ORAL_TABLET | Freq: Once | ORAL | 0 refills | Status: AC

## 2017-05-04 NOTE — Telephone Encounter (Signed)
Patient is having vaginal itching and discharge, requesting prescription for Diflucan. Please advise.

## 2017-05-04 NOTE — Telephone Encounter (Signed)
Informed patient prescription sent for Diflucan.

## 2017-07-17 ENCOUNTER — Ambulatory Visit (HOSPITAL_COMMUNITY): Payer: BLUE CROSS/BLUE SHIELD | Admitting: Psychiatry

## 2017-07-20 ENCOUNTER — Ambulatory Visit (INDEPENDENT_AMBULATORY_CARE_PROVIDER_SITE_OTHER): Payer: BLUE CROSS/BLUE SHIELD | Admitting: Psychiatry

## 2017-07-20 ENCOUNTER — Encounter (HOSPITAL_COMMUNITY): Payer: Self-pay | Admitting: Psychiatry

## 2017-07-20 VITALS — BP 107/74 | HR 56 | Ht 63.0 in | Wt 194.0 lb

## 2017-07-20 DIAGNOSIS — Z79899 Other long term (current) drug therapy: Secondary | ICD-10-CM

## 2017-07-20 DIAGNOSIS — Z818 Family history of other mental and behavioral disorders: Secondary | ICD-10-CM

## 2017-07-20 DIAGNOSIS — F332 Major depressive disorder, recurrent severe without psychotic features: Secondary | ICD-10-CM

## 2017-07-20 DIAGNOSIS — G479 Sleep disorder, unspecified: Secondary | ICD-10-CM

## 2017-07-20 DIAGNOSIS — F419 Anxiety disorder, unspecified: Secondary | ICD-10-CM | POA: Diagnosis not present

## 2017-07-20 DIAGNOSIS — F331 Major depressive disorder, recurrent, moderate: Secondary | ICD-10-CM

## 2017-07-20 DIAGNOSIS — Z813 Family history of other psychoactive substance abuse and dependence: Secondary | ICD-10-CM | POA: Diagnosis not present

## 2017-07-20 MED ORDER — TRAZODONE HCL 50 MG PO TABS: 50.0000 mg | ORAL_TABLET | Freq: Every day | ORAL | 2 refills | Status: DC

## 2017-07-20 MED ORDER — CLONAZEPAM 1 MG PO TABS: 1.0000 mg | ORAL_TABLET | Freq: Two times a day (BID) | ORAL | 2 refills | Status: DC | PRN

## 2017-07-20 MED ORDER — FLUOXETINE HCL 40 MG PO CAPS: 40.0000 mg | ORAL_CAPSULE | Freq: Every day | ORAL | 2 refills | Status: DC

## 2017-07-20 MED ORDER — ARIPIPRAZOLE 2 MG PO TABS: 2.0000 mg | ORAL_TABLET | Freq: Every day | ORAL | 2 refills | Status: DC
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
## 2017-09-04 ENCOUNTER — Telehealth: Payer: Self-pay | Admitting: Women's Health

## 2017-09-04 NOTE — Telephone Encounter (Signed)
Patient asked if she needed to refrain from intercourse before procedure. Informed patient that since her Nexplanon was not expired, she did not have to refrain. Verbalized understanding.

## 2017-09-04 NOTE — Telephone Encounter (Signed)
pateint called stating that she would like to speak with [name] or a nurse, Patient did not state the reason why. Please contact pt

## 2017-09-25 ENCOUNTER — Encounter: Payer: Self-pay | Admitting: Women's Health

## 2017-09-25 ENCOUNTER — Ambulatory Visit (INDEPENDENT_AMBULATORY_CARE_PROVIDER_SITE_OTHER): Payer: BLUE CROSS/BLUE SHIELD | Admitting: Women's Health

## 2017-09-25 VITALS — BP 112/70 | HR 87 | Ht 63.0 in | Wt 204.6 lb

## 2017-09-25 DIAGNOSIS — Z3046 Encounter for surveillance of implantable subdermal contraceptive: Secondary | ICD-10-CM | POA: Diagnosis not present

## 2017-09-25 DIAGNOSIS — Z30017 Encounter for initial prescription of implantable subdermal contraceptive: Secondary | ICD-10-CM

## 2017-09-25 DIAGNOSIS — Z3202 Encounter for pregnancy test, result negative: Secondary | ICD-10-CM

## 2017-09-25 LAB — POCT URINE PREGNANCY: Preg Test, Ur: NEGATIVE

## 2017-09-25 MED ORDER — ETONOGESTREL 68 MG ~~LOC~~ IMPL
68.0000 mg | DRUG_IMPLANT | Freq: Once | SUBCUTANEOUS | Status: AC
  Administered 2017-09-25: 68 mg via SUBCUTANEOUS

## 2017-09-25 NOTE — Progress Notes (Signed)
   NEXPLANON REMOVAL AND RE-INSERTION Patient name: Sandra Glover Koeneman MRN [id number]  Date of birth: 07-18-1986 Subjective Findings:   Sandra Glover Brasil is a 31 y.o. [id number] Caucasian female being seen today for Nexplanon removal and re-insertion. Her Nexplanon was placed 09/15/14.   Patient's last menstrual period was 09/06/2017 (exact date). Last pap 07/09/13. Results were:  normal  Risks/benefits/side effects of Nexplanon have been discussed and her questions have been answered.  Specifically, a failure rate of 12/998 has been reported, with an increased failure rate if pt takes St. John's Wort and/or antiseizure medicaitons.  She is aware of the common side effect of irregular bleeding, which the incidence of decreases over time. Signed copy of informed consent in chart.  Pertinent History Reviewed:   Reviewed past medical,surgical, social, obstetrical and family history.  Reviewed problem list, medications and allergies. Objective Findings & Procedure:    Vitals:   09/25/17 0918  BP: 112/70  Pulse: 87  Weight: 204 lb 9.6 oz (92.8 kg)  Height: 5\' 3"  (1.6 m)  Body mass index is 36.24 kg/m.  Results for orders placed or performed in visit on 09/25/17 (from the past 24 hour(s))  POCT urine pregnancy   Collection Time: 09/25/17  9:20 AM  Result Value Ref Range   Preg Test, Ur Negative Negative     Time out was performed.  Nexplanon site identified.  Area prepped in usual sterile fashon. Two cc's of 2% lidocaine was used to anesthetize the area. A small stab incision was made right beside the implant on the distal portion.  The Nexplanon rod was grasped using hemostats and removed intact without difficulty.  The area was cleansed again with betadine and the Nexplanon was inserted per manufacturer's recommendations without difficulty.  Steri-strips and a pressure bandage was applied.  There was less than 3 cc blood loss. There were no complications.  The patient tolerated the procedure  well. Assessment & Plan:   1) Nexplanon removal & re-insertion She was instructed to keep the area clean and dry, remove pressure bandage in 24 hours, and keep insertion site covered with the steri-strips for 3-5 days.  She was given a card indicating date Nexplanon was inserted and date it needs to be removed.  Follow-up PRN problems.  Orders Placed This Encounter  Procedures  . POCT urine pregnancy    Follow-up: Return for within next 3wks for , Pap & physical.  Marge DuncansBooker, Coston Mandato Randall CNM, [facility]NP[facility]BC 09/25/2017 9:43 AM

## 2017-09-25 NOTE — Addendum Note (Signed)
Addended by: Colen DarlingYOUNG, Vallarie Fei S on: 09/25/2017 10:13 AM   Modules accepted: Orders

## 2017-09-25 NOTE — Patient Instructions (Signed)

## 2017-10-13 ENCOUNTER — Encounter (HOSPITAL_COMMUNITY): Payer: Self-pay

## 2017-10-13 ENCOUNTER — Emergency Department (HOSPITAL_COMMUNITY)
Admission: EM | Admit: 2017-10-13 | Discharge: 2017-10-13 | Disposition: A | Discharge status: Home / Self Care | Payer: BLUE CROSS/BLUE SHIELD | Attending: Emergency Medicine | Admitting: Emergency Medicine

## 2017-10-13 ENCOUNTER — Encounter (HOSPITAL_COMMUNITY): Payer: Self-pay | Admitting: Psychiatry

## 2017-10-13 ENCOUNTER — Ambulatory Visit (HOSPITAL_COMMUNITY): Payer: BLUE CROSS/BLUE SHIELD | Admitting: Psychiatry

## 2017-10-13 ENCOUNTER — Other Ambulatory Visit: Payer: Self-pay

## 2017-10-13 DIAGNOSIS — Z79899 Other long term (current) drug therapy: Secondary | ICD-10-CM | POA: Diagnosis not present

## 2017-10-13 DIAGNOSIS — F331 Major depressive disorder, recurrent, moderate: Secondary | ICD-10-CM | POA: Diagnosis not present

## 2017-10-13 DIAGNOSIS — F329 Major depressive disorder, single episode, unspecified: Secondary | ICD-10-CM | POA: Insufficient documentation

## 2017-10-13 DIAGNOSIS — R5383 Other fatigue: Secondary | ICD-10-CM | POA: Diagnosis not present

## 2017-10-13 LAB — CBC WITH DIFFERENTIAL/PLATELET
Basophils Absolute: 0 10*3/uL (ref 0.0–0.1)
Basophils Relative: 0 %
Eosinophils Absolute: 0.3 10*3/uL (ref 0.0–0.7)
Eosinophils Relative: 3 %
HCT: 40.4 % (ref 36.0–46.0)
Hemoglobin: 13.8 g/dL (ref 12.0–15.0)
Lymphocytes Relative: 23 %
Lymphs Abs: 2.8 10*3/uL (ref 0.7–4.0)
MCH: 31 pg (ref 26.0–34.0)
MCHC: 34.2 g/dL (ref 30.0–36.0)
MCV: 90.8 fL (ref 78.0–100.0)
Monocytes Absolute: 0.5 10*3/uL (ref 0.1–1.0)
Monocytes Relative: 5 %
Neutro Abs: 8.3 10*3/uL — ABNORMAL HIGH (ref 1.7–7.7)
Neutrophils Relative %: 69 %
Platelets: 285 10*3/uL (ref 150–400)
RBC: 4.45 MIL/uL (ref 3.87–5.11)
RDW: 12.7 % (ref 11.5–15.5)
WBC: 12 10*3/uL — ABNORMAL HIGH (ref 4.0–10.5)

## 2017-10-13 LAB — COMPREHENSIVE METABOLIC PANEL
ALT: 15 U/L (ref 14–54)
AST: 22 U/L (ref 15–41)
Albumin: 4.4 g/dL (ref 3.5–5.0)
Alkaline Phosphatase: 154 U/L — ABNORMAL HIGH (ref 38–126)
Anion gap: 11 (ref 5–15)
BUN: 9 mg/dL (ref 6–20)
CO2: 23 mmol/L (ref 22–32)
Calcium: 9.3 mg/dL (ref 8.9–10.3)
Chloride: 104 mmol/L (ref 101–111)
Creatinine, Ser: 0.78 mg/dL (ref 0.44–1.00)
GFR calc Af Amer: >60 mL/min (ref >60)
GFR calc non Af Amer: >60 mL/min (ref >60)
Glucose, Bld: 82 mg/dL (ref 65–99)
Potassium: 3.7 mmol/L (ref 3.5–5.1)
Sodium: 138 mmol/L (ref 135–145)
Total Bilirubin: 0.7 mg/dL (ref 0.3–1.2)
Total Protein: 7.8 g/dL (ref 6.5–8.1)

## 2017-10-13 LAB — URINALYSIS, ROUTINE W REFLEX MICROSCOPIC
Bacteria, UA: NONE SEEN
Bilirubin Urine: NEGATIVE
Glucose, UA: NEGATIVE mg/dL
Ketones, ur: NEGATIVE mg/dL
Leukocytes, UA: NEGATIVE
Nitrite: NEGATIVE
Protein, ur: NEGATIVE mg/dL
Specific Gravity, Urine: 1.015 (ref 1.005–1.030)
pH: 5 (ref 5.0–8.0)

## 2017-10-13 LAB — LIPASE, BLOOD: Lipase: 28 U/L (ref 11–51)

## 2017-10-13 LAB — PREGNANCY, URINE: Preg Test, Ur: NEGATIVE

## 2017-10-13 LAB — TSH: TSH: 2.553 u[IU]/mL (ref 0.350–4.500)

## 2017-10-13 MED ORDER — SODIUM CHLORIDE 0.9 % IV SOLN: INTRAVENOUS | Status: DC

## 2017-10-13 MED ORDER — SODIUM CHLORIDE 0.9 % IV BOLUS (SEPSIS)
1000.0000 mL | Freq: Once | INTRAVENOUS | Status: AC
  Administered 2017-10-13: 1000 mL via INTRAVENOUS

## 2017-10-13 NOTE — ED Provider Notes (Signed)
[facility]   CSN: [id number] Arrival date & time: 10/13/17  1023     History   Chief Complaint Chief Complaint  Patient presents with  . Fatigue    HPI Sandra Glover is a 31 y.o. female.  Patient with a history of depression.  Followed by behavioral health.  Had an appointment for that earlier today.  [facility] counselor thought that she looked more fatigued and had the puffiness and dark circles under eyes.  Patient states that she feels like she is dehydrated.  She has not had much energy.  Symptoms have been present for the past week.  Denies any specific upper respiratory infection symptoms dysuria or fevers abdominal pain shortness of breath.      Past Medical History:  Diagnosis Date  . Anxiety   . Depression   . Gestational diabetes mellitus, antepartum    gestational only  . History of anemia 2013  . History of depression    states stopped med. in 2013  . 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 Pertussis   . Postcoital bleeding 07/14/2015  . Sinus infection 01/25/2013   started antibiotic 01/24/2013 x 10 days; nasal congestion, clear drainage from nose  . Tonsillar and adenoid hypertrophy 01/2013  . Vaginal irritation 03/25/2014   Mild yeast but has history of yeast, will not treat til after first trimester,try yogurt    Patient Active Problem List   Diagnosis Date Noted  . Nexplanon insertion 09/25/2017  . Hemorrhoids 09/19/2016  . Rectal bleeding 03/24/2016  . Constipation 03/24/2016  . Postcoital bleeding 07/14/2015  . History of preterm delivery 09/09/2014  . Postpartum UTI 09/09/2014  . Pertussis 07/30/2014  . History of gestational diabetes 07/24/2014  . 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 Depression with anxiety 03/03/2014    Past Surgical History:  Procedure Laterality Date  . CHOLECYSTECTOMY  04/23/2010   laparoscopic  . PARTIAL NEPHRECTOMY Right  age 31 mos.    OB History    Gravida Para Term Preterm AB Living   2 2 1 1   2    SAB TAB Ectopic Multiple Live Births           2       Home Medications    Prior to Admission medications   Medication Sig Start Date End Date Taking? Authorizing Provider  ARIPiprazole (ABILIFY) 2 MG tablet Take 1 tablet (2 mg total) by mouth daily. 07/20/17  Yes Myrlene Brokeross, Deborah R, MD  clonazePAM (KLONOPIN) 1 MG tablet Take 1 tablet (1 mg total) by mouth 2 (two) times daily as needed for anxiety. 07/20/17 07/20/18 Yes Myrlene Brokeross, Deborah R, MD  etonogestrel (NEXPLANON) 68 MG IMPL implant 1 each by Subdermal route once.   Yes [provider]  FLUoxetine (PROZAC) 40 MG capsule Take 1 capsule (40 mg total) by mouth daily. 07/20/17 07/20/18 Yes Myrlene Brokeross, Deborah R, MD  LINZESS 145 MCG CAPS capsule TAKE 1 CAPSULE BY MOUTH PRIOR TO FIRST MEAL OF THE DAY 04/17/17  Yes Gelene MinkBoone, Anna W, NP  traZODone (DESYREL) 50 MG tablet Take 1 tablet (50 mg total) by mouth at bedtime. 07/20/17  Yes Myrlene Brokeross, Deborah R, MD    Family History Family History  Problem Relation Age of Onset  . COPD Mother   . Depression Mother   . Anxiety disorder Mother   . Hypertension Mother   . Irritable bowel syndrome Mother   .  Arthritis Mother   . Skin cancer Mother   . Ovarian cancer Maternal Aunt   . Ovarian cancer Maternal Grandmother   . Hypertension Father   . GER disease Father   . Bipolar disorder Paternal Uncle   . Anxiety disorder Cousin   . Drug abuse Cousin   . Bipolar disorder Cousin   . Colon cancer Neg Hx     Social History Social History   Tobacco Use  . Smoking status: Never Smoker  . Smokeless tobacco: Never Used  . Tobacco comment: Never smoked  Substance Use Topics  . Alcohol use: Yes    Alcohol/week: 0.0 oz    Comment: socially  . Drug use: No     Allergies   Lortab [hydrocodone-acetaminophen] and Morphine and related   Review of Systems Review of Systems  Constitutional: Positive for activity change and  fatigue. Negative for fever.  HENT: Negative for congestion and sore throat.   Eyes: Negative for redness and visual disturbance.  Respiratory: Negative for shortness of breath.   Cardiovascular: Negative for chest pain.  Gastrointestinal: Negative for abdominal pain, diarrhea, nausea and vomiting.  Genitourinary: Negative for dysuria.  Musculoskeletal: Negative for back pain and myalgias.  Skin: Negative for rash.  Neurological: Negative for headaches.  Hematological: Does not bruise/bleed easily.  Psychiatric/Behavioral: Negative for confusion.     Physical Exam Updated Vital Signs BP (!) 144/97 (BP Location: Right Arm)   Pulse 76   Temp 98.9 F (37.2 C) (Oral)   Resp 15   Ht 1.6 m (5\' 3" )   Wt 90.3 kg (199 lb)   LMP 09/30/2017   SpO2 99%   BMI 35.25 kg/m   Physical Exam  Constitutional: She is oriented to person, place, and time. She appears well-developed and well-nourished. No distress.  HENT:  Head: Normocephalic and atraumatic.  Mouth/Throat: No oropharyngeal exudate.  Mucous membranes slightly dry.  Eyes: EOM are normal. Pupils are equal, round, and reactive to light.  Neck: Normal range of motion. Neck supple.  Cardiovascular: Normal rate, regular rhythm and normal heart sounds.  Pulmonary/Chest: Effort normal and breath sounds normal. No respiratory distress.  Abdominal: Soft. Bowel sounds are normal. There is no tenderness.  Musculoskeletal: Normal range of motion. She exhibits no edema.  Neurological: She is alert and oriented to person, place, and time. No cranial nerve deficit or sensory deficit. She exhibits normal muscle tone. Coordination normal.  Skin: Skin is warm.  Nursing note and vitals reviewed.    ED Treatments / Results  Labs (all labs ordered are listed, but only abnormal results are displayed) Labs Reviewed  COMPREHENSIVE METABOLIC PANEL - Abnormal; Notable for the following components:      Result Value   Alkaline Phosphatase 154 (*)      All other components within normal limits  CBC WITH DIFFERENTIAL/PLATELET - Abnormal; Notable for the following components:   WBC 12.0 (*)    Neutro Abs 8.3 (*)    All other components within normal limits  URINALYSIS, ROUTINE W REFLEX MICROSCOPIC - Abnormal; Notable for the following components:   Hgb urine dipstick MODERATE (*)    Squamous Epithelial / LPF 0-5 (*)    All other components within normal limits  LIPASE, BLOOD  PREGNANCY, URINE  TSH    EKG  EKG Interpretation None       Radiology No results found.  Procedures Procedures (including critical care time)  Medications Ordered in ED Medications  0.9 %  sodium chloride infusion (not administered)  sodium chloride 0.9 % bolus 1,000 mL (1,000 mLs Intravenous New Bag/Given 10/13/17 1316)     Initial Impression / Assessment and Plan / ED Course  I have reviewed the triage vital signs and the nursing notes.  Pertinent labs & imaging results that were available during my care of the patient were reviewed by me and considered in my medical decision making (see chart for details).    Patient's vital signs without any significant abnormalities.  Exam without any specific findings.  Patient underwent a general lab screen.  Only abnormal finding was a mild leukocytosis a white count of 12,000.  Otherwise urinalysis negative pregnancy test negative electrolytes without any significant abnormalities.  Thyroid-stimulating hormone was checked and was a normal range.  Patient received 1 L of IV fluids here for hydration purposes.  Patient stable for discharge home and follow-up with her doctor.  To return for any new or worse symptoms.   Final Clinical Impressions(s) / ED Diagnoses   Final diagnoses:  Fatigue, unspecified type    ED Discharge Orders    None       Vanetta MuldersZackowski, Kippy Melena, MD 10/13/17 1448
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
## 2017-10-13 NOTE — ED Triage Notes (Signed)
Patient states she woke up today with dark circles under eyes and decreased fluid intake over the past week and fatigue. States she has been depressed which she is seeing outpatient therapist for. Does not want to be seen for depression per patient.

## 2017-10-13 NOTE — Discharge Instructions (Signed)
Lab workup here without any acute findings.  No evidence of any thyroid abnormalities.  Basic labs without any significant findings.  Follow-up with your doctors.  The only exception would be a slight elevation in the white blood cell count.  Could be that your body is fighting some type of mild infection off.  Return for any new or worse symptoms.  Work note provided.

## 2017-10-16 ENCOUNTER — Ambulatory Visit (HOSPITAL_COMMUNITY): Payer: BLUE CROSS/BLUE SHIELD | Admitting: Psychiatry

## 2017-10-16 ENCOUNTER — Ambulatory Visit (INDEPENDENT_AMBULATORY_CARE_PROVIDER_SITE_OTHER): Payer: BLUE CROSS/BLUE SHIELD | Admitting: Women's Health

## 2017-10-16 ENCOUNTER — Encounter: Payer: Self-pay | Admitting: Women's Health

## 2017-10-16 ENCOUNTER — Encounter (HOSPITAL_COMMUNITY): Payer: Self-pay | Admitting: Psychiatry

## 2017-10-16 ENCOUNTER — Other Ambulatory Visit (HOSPITAL_COMMUNITY)
Admission: RE | Admit: 2017-10-16 | Discharge: 2017-10-16 | Disposition: A | Discharge status: Home / Self Care | Payer: BLUE CROSS/BLUE SHIELD | Source: Ambulatory Visit | Attending: Obstetrics & Gynecology | Admitting: Obstetrics & Gynecology

## 2017-10-16 VITALS — BP 128/80 | HR 85 | Ht 62.25 in | Wt 200.5 lb

## 2017-10-16 VITALS — BP 122/84 | HR 77 | Ht 62.25 in | Wt 198.5 lb

## 2017-10-16 DIAGNOSIS — F419 Anxiety disorder, unspecified: Secondary | ICD-10-CM

## 2017-10-16 DIAGNOSIS — Z818 Family history of other mental and behavioral disorders: Secondary | ICD-10-CM | POA: Diagnosis not present

## 2017-10-16 DIAGNOSIS — Z630 Problems in relationship with spouse or partner: Secondary | ICD-10-CM

## 2017-10-16 DIAGNOSIS — Z6379 Other stressful life events affecting family and household: Secondary | ICD-10-CM | POA: Diagnosis not present

## 2017-10-16 DIAGNOSIS — F4310 Post-traumatic stress disorder, unspecified: Secondary | ICD-10-CM | POA: Diagnosis not present

## 2017-10-16 DIAGNOSIS — R450 Nervousness: Secondary | ICD-10-CM | POA: Diagnosis not present

## 2017-10-16 DIAGNOSIS — Z813 Family history of other psychoactive substance abuse and dependence: Secondary | ICD-10-CM | POA: Diagnosis not present

## 2017-10-16 DIAGNOSIS — Z01419 Encounter for gynecological examination (general) (routine) without abnormal findings: Secondary | ICD-10-CM | POA: Diagnosis not present

## 2017-10-16 DIAGNOSIS — F331 Major depressive disorder, recurrent, moderate: Secondary | ICD-10-CM

## 2017-10-16 MED ORDER — FLUOXETINE HCL 40 MG PO CAPS: 40.0000 mg | ORAL_CAPSULE | Freq: Every day | ORAL | 2 refills | Status: DC

## 2017-10-16 MED ORDER — ALPRAZOLAM 1 MG PO TABS: 1.0000 mg | ORAL_TABLET | Freq: Two times a day (BID) | ORAL | 2 refills | Status: DC | PRN

## 2017-10-16 MED ORDER — TRAZODONE HCL 50 MG PO TABS: 50.0000 mg | ORAL_TABLET | Freq: Every day | ORAL | 2 refills | Status: DC

## 2017-10-16 MED ORDER — ARIPIPRAZOLE 2 MG PO TABS: 2.0000 mg | ORAL_TABLET | Freq: Every day | ORAL | 2 refills | Status: DC
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
## 2017-10-16 NOTE — Progress Notes (Signed)
WELL-WOMAN EXAMINATION Patient name: Sandra Glover Kutsch MRN [id number]  Date of birth: November 28, 1986 Chief Complaint:   Gynecologic Exam  History of Present Illness:   Sandra Glover Urenda is a 31 y.o. [id number] Caucasian female being seen today for a routine well-woman exam.  Current complaints: has depression/anxiety, sees therapist today to adjust doses of depression meds. Denies SI/HI.   Depression screen [facility] 2/9 10/16/2017 07/31/2014  Decreased Interest 3 0  Down, Depressed, Hopeless 3 0  PHQ - 2 Score 6 0  Altered sleeping 1 -  Tired, decreased energy 3 -  Change in appetite 1 -  Feeling bad or failure about yourself  1 -  Trouble concentrating 3 -  Moving slowly or fidgety/restless 1 -  Suicidal thoughts 0 -  PHQ-9 Score 16 -  Difficult doing work/chores Very difficult -  Some encounter information is confidential and restricted. Go to Review Flowsheets activity to see all data.    PCP: Dr. Salvatore DecentPaul Settle, [place]      does not desire labs Patient's last menstrual period was 09/30/2017. The current method of family planning is Nexplanon, placed 09/25/17 Last pap 2014. Results were: normal Last mammogram: never. Results were: n/a Last colonoscopy: last year d/t hemorrhoids. Results were: normal  Review of Systems:   Pertinent items are noted in HPI Denies any headaches, blurred vision, fatigue, shortness of breath, chest pain, abdominal pain, abnormal vaginal discharge/itching/odor/irritation, problems with periods, bowel movements, urination, or intercourse unless otherwise stated above. Pertinent History Reviewed:  Reviewed past medical,surgical, social and family history.  Reviewed problem list, medications and allergies. Physical Assessment:   Vitals:   10/16/17 0845  BP: 128/80  Pulse: 85  Weight: 200 lb 8 oz (90.9 kg)  Height: 5' 2.25" (1.581 m)  Body mass index is 36.38 kg/m.        Physical Examination:   General appearance - well appearing, and in no  distress  Mental status - alert, oriented to person, place, and time  Psych:  She has a normal mood and affect  Skin - warm and dry, normal color, no suspicious lesions noted  Chest - effort normal, all lung fields clear to auscultation bilaterally  Heart - normal rate and regular rhythm  Neck:  midline trachea, no thyromegaly or nodules  Breasts - breasts appear normal, no suspicious masses, no skin or nipple changes or  axillary nodes  Abdomen - soft, nontender, nondistended, no masses or organomegaly  Pelvic - VULVA: normal appearing vulva with no masses, tenderness or lesions  VAGINA: normal appearing vagina with normal color and discharge, no lesions  CERVIX: normal appearing cervix without discharge or lesions, no CMT  Thin prep pap is done w/ reflex HR HPV cotesting  UTERUS: uterus is felt to be normal size, shape, consistency and nontender   ADNEXA: No adnexal masses or tenderness noted.  Extremities:  No swelling or varicosities noted  No results found for this or any previous visit (from the past 24 hour(s)).  Assessment & Plan:  1) Well-Woman Exam  2) Depression/anxiety>sees therapist today to adjust meds, denies SI/HI. Discussed dep/anx can potentially worsen w/ nexplanon- just had removed/reinserted 09/25/13, if gets to point where feels like meds not helping sx, can let us know and we can remove nexplanon to see if it will help  Labs/procedures today: pap  Mammogram @29yo  or sooner if problems Colonoscopy @29yo  or sooner if problems  No orders of the defined types were placed in this encounter.   Follow-up: Return  in about 1 year (around 10/16/2018) for Physical.  Marge DuncansBooker, Jennika Ringgold Randall CNM, [facility] 10/16/2017 9:02 AM

## 2017-10-18 LAB — CYTOLOGY - PAP
Diagnosis: NEGATIVE
HPV: NOT DETECTED

## 2017-10-19 ENCOUNTER — Ambulatory Visit (HOSPITAL_COMMUNITY): Payer: Self-pay | Admitting: Psychiatry

## 2017-10-20 ENCOUNTER — Telehealth (HOSPITAL_COMMUNITY): Payer: Self-pay | Admitting: *Deleted

## 2017-10-20 NOTE — Telephone Encounter (Signed)
phone call from patient, said Dr. Tenny Crawoss took her out of work and wrote a Physicist, medicalletter for her school.   She wants to know if Gigi Gineggy will also write a letter.

## 2017-10-23 ENCOUNTER — Telehealth (HOSPITAL_COMMUNITY): Payer: Self-pay | Admitting: Psychiatry

## 2017-10-23 NOTE — Telephone Encounter (Signed)
Therapist returned call to patient who is requesting letter to present to school to support her request for withdrawal for the semester due to stress and her diagnosis. Patient and therapist agreed to discuss this at next appointment on 11/07/2017.

## 2017-11-07 ENCOUNTER — Encounter (HOSPITAL_COMMUNITY): Payer: Self-pay | Admitting: Psychiatry

## 2017-11-07 ENCOUNTER — Ambulatory Visit (HOSPITAL_COMMUNITY): Payer: BLUE CROSS/BLUE SHIELD | Admitting: Psychiatry

## 2017-11-07 DIAGNOSIS — F331 Major depressive disorder, recurrent, moderate: Secondary | ICD-10-CM

## 2017-11-07 DIAGNOSIS — F4310 Post-traumatic stress disorder, unspecified: Secondary | ICD-10-CM
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
## 2017-11-15 ENCOUNTER — Ambulatory Visit (HOSPITAL_COMMUNITY): Payer: BLUE CROSS/BLUE SHIELD | Admitting: Psychiatry

## 2017-11-15 ENCOUNTER — Encounter (HOSPITAL_COMMUNITY): Payer: Self-pay | Admitting: Psychiatry

## 2017-11-15 VITALS — BP 120/76 | HR 110 | Ht 62.0 in | Wt 209.0 lb

## 2017-11-15 DIAGNOSIS — Z813 Family history of other psychoactive substance abuse and dependence: Secondary | ICD-10-CM

## 2017-11-15 DIAGNOSIS — F331 Major depressive disorder, recurrent, moderate: Secondary | ICD-10-CM | POA: Diagnosis not present

## 2017-11-15 DIAGNOSIS — F4310 Post-traumatic stress disorder, unspecified: Secondary | ICD-10-CM | POA: Diagnosis not present

## 2017-11-15 DIAGNOSIS — Z818 Family history of other mental and behavioral disorders: Secondary | ICD-10-CM

## 2017-11-15 MED ORDER — ARIPIPRAZOLE 2 MG PO TABS: 2.0000 mg | ORAL_TABLET | Freq: Every day | ORAL | 2 refills | Status: DC

## 2017-11-15 MED ORDER — TRAZODONE HCL 50 MG PO TABS: 50.0000 mg | ORAL_TABLET | Freq: Every day | ORAL | 2 refills | Status: DC

## 2017-11-15 MED ORDER — FLUOXETINE HCL 40 MG PO CAPS: 40.0000 mg | ORAL_CAPSULE | Freq: Every day | ORAL | 2 refills | Status: DC

## 2017-11-15 MED ORDER — ALPRAZOLAM 1 MG PO TABS: 1.0000 mg | ORAL_TABLET | Freq: Two times a day (BID) | ORAL | 2 refills | Status: DC | PRN
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
## 2017-11-21 ENCOUNTER — Ambulatory Visit (HOSPITAL_COMMUNITY): Payer: BLUE CROSS/BLUE SHIELD | Admitting: Psychiatry

## 2017-11-21 ENCOUNTER — Encounter (HOSPITAL_COMMUNITY): Payer: Self-pay | Admitting: Psychiatry

## 2017-11-21 DIAGNOSIS — F331 Major depressive disorder, recurrent, moderate: Secondary | ICD-10-CM | POA: Diagnosis not present
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
## 2018-01-29 ENCOUNTER — Ambulatory Visit (HOSPITAL_COMMUNITY): Payer: BLUE CROSS/BLUE SHIELD | Admitting: Psychiatry

## 2018-02-07 ENCOUNTER — Ambulatory Visit (INDEPENDENT_AMBULATORY_CARE_PROVIDER_SITE_OTHER): Payer: BLUE CROSS/BLUE SHIELD | Admitting: Psychiatry

## 2018-02-07 ENCOUNTER — Encounter (HOSPITAL_COMMUNITY): Payer: Self-pay | Admitting: Psychiatry

## 2018-02-07 VITALS — BP 126/70 | HR 77 | Ht 62.0 in | Wt 215.0 lb

## 2018-02-07 DIAGNOSIS — F331 Major depressive disorder, recurrent, moderate: Secondary | ICD-10-CM

## 2018-02-07 DIAGNOSIS — R450 Nervousness: Secondary | ICD-10-CM | POA: Diagnosis not present

## 2018-02-07 DIAGNOSIS — Z818 Family history of other mental and behavioral disorders: Secondary | ICD-10-CM

## 2018-02-07 DIAGNOSIS — Z813 Family history of other psychoactive substance abuse and dependence: Secondary | ICD-10-CM | POA: Diagnosis not present

## 2018-02-07 DIAGNOSIS — Z630 Problems in relationship with spouse or partner: Secondary | ICD-10-CM | POA: Diagnosis not present

## 2018-02-07 DIAGNOSIS — F419 Anxiety disorder, unspecified: Secondary | ICD-10-CM | POA: Diagnosis not present

## 2018-02-07 MED ORDER — FLUOXETINE HCL 40 MG PO CAPS: 40.0000 mg | ORAL_CAPSULE | Freq: Every day | ORAL | 2 refills | Status: DC

## 2018-02-07 MED ORDER — TRAZODONE HCL 50 MG PO TABS: 50.0000 mg | ORAL_TABLET | Freq: Every day | ORAL | 2 refills | Status: DC

## 2018-02-07 MED ORDER — LISDEXAMFETAMINE DIMESYLATE 30 MG PO CAPS: 30.0000 mg | ORAL_CAPSULE | ORAL | 0 refills | Status: DC

## 2018-02-07 MED ORDER — ARIPIPRAZOLE 2 MG PO TABS: 2.0000 mg | ORAL_TABLET | Freq: Every day | ORAL | 2 refills | Status: DC

## 2018-02-07 MED ORDER — ALPRAZOLAM 1 MG PO TABS: 1.0000 mg | ORAL_TABLET | Freq: Two times a day (BID) | ORAL | 2 refills | Status: DC | PRN
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
## 2018-02-13 ENCOUNTER — Ambulatory Visit (HOSPITAL_COMMUNITY): Payer: Self-pay | Admitting: Psychiatry

## 2018-03-07 ENCOUNTER — Ambulatory Visit (INDEPENDENT_AMBULATORY_CARE_PROVIDER_SITE_OTHER): Payer: BLUE CROSS/BLUE SHIELD | Admitting: Psychiatry

## 2018-03-07 ENCOUNTER — Encounter (HOSPITAL_COMMUNITY): Payer: Self-pay | Admitting: Psychiatry

## 2018-03-07 VITALS — BP 125/79 | HR 96 | Ht 62.0 in | Wt 206.0 lb

## 2018-03-07 DIAGNOSIS — Z818 Family history of other mental and behavioral disorders: Secondary | ICD-10-CM | POA: Diagnosis not present

## 2018-03-07 DIAGNOSIS — Z813 Family history of other psychoactive substance abuse and dependence: Secondary | ICD-10-CM

## 2018-03-07 DIAGNOSIS — F331 Major depressive disorder, recurrent, moderate: Secondary | ICD-10-CM

## 2018-03-07 MED ORDER — LISDEXAMFETAMINE DIMESYLATE 30 MG PO CAPS: 30.0000 mg | ORAL_CAPSULE | ORAL | 0 refills | Status: DC

## 2018-03-07 NOTE — Progress Notes (Signed)
BH MD/PA/NP OP Progress Note  03/07/2018 9:05 AM Sandra Glover  MRN:  [id number]  Chief Complaint:  Chief Complaint    Depression; Anxiety; ADD     HPI: This patient is a 32 year old married white female who lives with her husband, 72-year-old son and 3-year-old daughter in [place].  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  Last time she stated that her mood was fairly stable but she was very stressed at work.  They were having her multitask between several jobs and she could not get anything completed.  She was told that her work performance was dropping and she was at risk of losing her job.  She was having a great deal of trouble getting focused.  We decided to try Vyvanse 30 mg daily in addition to her other medications.  This month the patient states she is doing much better.  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  The medication is helping her stay alert and focused through the day and she is also eating less and has lost 9 pounds.  Her mood has been good her energy is good and she is sleeping well denies being depressed Visit Diagnosis:    ICD-10-CM   1. Major depressive disorder, recurrent episode, moderate (HCC) F33.1     Past Psychiatric History: none  Past Medical History:  Past Medical History:  Diagnosis Date  . Anxiety   . Anxiety   . Depression   . Gestational diabetes mellitus, antepartum    gestational only  . History of anemia 2013  . History of depression    states stopped med. in 2013  . History of partial nephrectomy age 79 mos.   right - due to kidney infection  . Pertussis   . Postcoital bleeding 07/14/2015  . Sinus infection 01/25/2013   started antibiotic 01/24/2013 x 10 days; nasal congestion, clear drainage from nose  . Tonsillar and adenoid hypertrophy 01/2013  . Vaginal irritation 03/25/2014   Mild yeast but has  history of yeast, will not treat til after first trimester,try yogurt    Past Surgical History:  Procedure Laterality Date  . CHOLECYSTECTOMY  04/23/2010   laparoscopic  . COLONOSCOPY WITH PROPOFOL N/A 05/17/2016   Procedure: COLONOSCOPY WITH PROPOFOL;  Surgeon: West Bali, MD;  [facility];  Service: Endoscopy;  Laterality: N/A;  1000  . HEMORRHOID BANDING N/A 05/17/2016   Procedure: HEMORRHOID BANDING;  Surgeon: West Bali, MD;  [facility];  Service: Endoscopy;  Laterality: N/A;  . HEMORRHOID SURGERY N/A 06/08/2016   Procedure: SIMPLE HEMORRHOIDECTOMY;  Surgeon: Ancil Linsey, MD;  Location: [facility];  Service: General;  Laterality: N/A;  . PARTIAL NEPHRECTOMY Right age 78 mos.  . TONSILLECTOMY AND ADENOIDECTOMY N/A 01/29/2013   Procedure: TONSILLECTOMY AND ADENOIDECTOMY;  Surgeon: Darletta Moll, MD;  Location:  [facility];  Service: ENT;  Laterality: N/A;    Family Psychiatric History: See below  Family History:  Family History  Problem Relation Age of Onset  . COPD Mother   . Depression Mother   . Anxiety disorder Mother   . Hypertension Mother   . Irritable bowel syndrome Mother   . Arthritis Mother   . Skin cancer Mother   . Ovarian cancer Maternal Aunt   . Ovarian cancer Maternal Grandmother   . Hypertension Father   .  GER disease Father   . Bipolar disorder Paternal Uncle   . Anxiety disorder Cousin   . Drug abuse Cousin   . Bipolar disorder Cousin   . Colon cancer Neg Hx     Social History:  Social History   Socioeconomic History  . Marital status: Married    Spouse name: Not on file  . Number of children: Not on file  . Years of education: Not on file  . Highest education level: Not on file  Occupational History  . Not on file  Social Needs  . Financial resource strain: Not on file  . Food insecurity:    Worry: Not on file    Inability: Not on file  . Transportation needs:    Medical: Not on file     Non-medical: Not on file  Tobacco Use  . Smoking status: Never Smoker  . Smokeless tobacco: Never Used  . Tobacco comment: Never smoked  Substance and Sexual Activity  . Alcohol use: Yes    Alcohol/week: 0.0 oz    Comment: socially  . Drug use: No  . Sexual activity: Yes    Birth control/protection: Implant    Comment: nexplanon implant  Lifestyle  . Physical activity:    Days per week: Not on file    Minutes per session: Not on file  . Stress: Not on file  Relationships  . Social connections:    Talks on phone: Not on file    Gets together: Not on file    Attends religious service: Not on file    Active member of club or organization: Not on file    Attends meetings of clubs or organizations: Not on file    Relationship status: Not on file  Other Topics Concern  . Not on file  Social History Narrative  . Not on file    Allergies:  Allergies  Allergen Reactions  . Lortab [Hydrocodone-Acetaminophen] Nausea And Vomiting  . Morphine And Related Rash    Metabolic Disorder Labs: No results found for: HGBA1C, MPG No results found for: PROLACTIN No results found for: CHOL, TRIG, HDL, CHOLHDL, VLDL, LDLCALC Lab Results  Component Value Date   TSH 2.553 10/13/2017    Therapeutic Level Labs: No results found for: LITHIUM No results found for: VALPROATE No components found for:  CBMZ  Current Medications: Current Outpatient Medications  Medication Sig Dispense Refill  . ALPRAZolam (XANAX) 1 MG tablet Take 1 tablet (1 mg total) by mouth 2 (two) times daily as needed for anxiety. 60 tablet 2  . ARIPiprazole (ABILIFY) 2 MG tablet Take 1 tablet (2 mg total) by mouth daily. 30 tablet 2  . etonogestrel (NEXPLANON) 68 MG IMPL implant 1 each by Subdermal route once.    Marland Kitchen. FLUoxetine (PROZAC) 40 MG capsule Take 1 capsule (40 mg total) by mouth daily. 30 capsule 2  . LINZESS 145 MCG CAPS capsule TAKE 1 CAPSULE BY MOUTH PRIOR TO FIRST MEAL OF THE DAY 90 capsule 3  .  lisdexamfetamine (VYVANSE) 30 MG capsule Take 1 capsule (30 mg total) by mouth every morning. 30 capsule 0  . traZODone (DESYREL) 50 MG tablet Take 1 tablet (50 mg total) by mouth at bedtime. 30 tablet 2  . lisdexamfetamine (VYVANSE) 30 MG capsule Take 1 capsule (30 mg total) by mouth every morning. 30 capsule 0   No current facility-administered medications for this visit.      Musculoskeletal: Strength & Muscle Tone: within normal limits Gait & Station: normal Patient  leans: N/A  Psychiatric Specialty Exam: Review of Systems  All other systems reviewed and are negative.   Blood pressure 125/79, pulse 96, height 5\' 2"  (1.575 m), weight 206 lb (93.4 kg), SpO2 96 %.Body mass index is 37.68 kg/m.  General Appearance: Casual, Neat and Well Groomed  Eye Contact:  Good  Speech:  Clear and Coherent  Volume:  Normal  Mood:  Euthymic  Affect:  Congruent  Thought Process:  Goal Directed  Orientation:  Full (Time, Place, and Person)  Thought Content: WDL   Suicidal Thoughts:  No  Homicidal Thoughts:  No  Memory:  Immediate;   Good Recent;   Good Remote;   Good  Judgement:  Fair  Insight:  Fair  Psychomotor Activity:  Normal  Concentration:  Concentration: Good and Attention Span: Good  Recall:  Good  Fund of Knowledge: Good  Language: Good  Akathisia:  No  Handed:  Right  AIMS (if indicated): not done  Assets:  Communication Skills Desire for Improvement Physical Health Resilience Social Support Talents/Skills  ADL's:  Intact  Cognition: WNL  Sleep:  Good   Screenings: PHQ2-9     Office Visit from 10/16/2017 in [facility] OB-GYN Counselor from 10/13/2017 in [facility] [facility] from 07/30/2014 in [facility]  PHQ-2 Total Score  6  6  (Pended)   0  PHQ-9 Total Score  16  21  (Pended)   -       Assessment and Plan: This patient is a 32 year old female with a history of depression anxiety and  possible ADD.  She will continue Prozac 40 mg daily for depression, Abilify 2 mg daily for augmentation, Xanax 1 mg 2 times daily as needed for anxiety and Vyvanse 30 mg each morning for ADD.  She will also continue trazodone 50 mg at bedtime for sleep.  She will return to see me in 2 months   [name], MD 03/07/2018, 9:05 AM

## 2018-03-30 ENCOUNTER — Other Ambulatory Visit (HOSPITAL_COMMUNITY): Payer: Self-pay | Admitting: Psychiatry

## 2018-04-17 ENCOUNTER — Other Ambulatory Visit: Payer: Self-pay | Admitting: Gastroenterology

## 2018-04-17 ENCOUNTER — Encounter: Payer: Self-pay | Admitting: Gastroenterology

## 2018-04-17 NOTE — Telephone Encounter (Signed)
PATIENT SCHEDULED AND LETTER SENT  °

## 2018-04-17 NOTE — Telephone Encounter (Signed)
Patient needs ov prior to 09/2018 to continue refills.

## 2018-05-07 ENCOUNTER — Ambulatory Visit (HOSPITAL_COMMUNITY): Payer: BLUE CROSS/BLUE SHIELD | Admitting: Psychiatry

## 2018-05-07 ENCOUNTER — Ambulatory Visit (INDEPENDENT_AMBULATORY_CARE_PROVIDER_SITE_OTHER): Payer: BLUE CROSS/BLUE SHIELD | Admitting: Psychiatry

## 2018-05-07 ENCOUNTER — Encounter (HOSPITAL_COMMUNITY): Payer: Self-pay | Admitting: Psychiatry

## 2018-05-07 VITALS — BP 107/65 | HR 78 | Ht 62.0 in | Wt 207.0 lb

## 2018-05-07 DIAGNOSIS — Z818 Family history of other mental and behavioral disorders: Secondary | ICD-10-CM | POA: Diagnosis not present

## 2018-05-07 DIAGNOSIS — Z814 Family history of other substance abuse and dependence: Secondary | ICD-10-CM | POA: Diagnosis not present

## 2018-05-07 DIAGNOSIS — F331 Major depressive disorder, recurrent, moderate: Secondary | ICD-10-CM | POA: Diagnosis not present

## 2018-05-07 DIAGNOSIS — Z79899 Other long term (current) drug therapy: Secondary | ICD-10-CM | POA: Diagnosis not present

## 2018-05-07 MED ORDER — FLUOXETINE HCL 40 MG PO CAPS: ORAL_CAPSULE | ORAL | 2 refills | Status: DC

## 2018-05-07 MED ORDER — ARIPIPRAZOLE 2 MG PO TABS: 2.0000 mg | ORAL_TABLET | Freq: Every day | ORAL | 2 refills | Status: DC

## 2018-05-07 MED ORDER — LISDEXAMFETAMINE DIMESYLATE 30 MG PO CAPS: 30.0000 mg | ORAL_CAPSULE | ORAL | 0 refills | Status: DC

## 2018-05-07 MED ORDER — ALPRAZOLAM 1 MG PO TABS: 1.0000 mg | ORAL_TABLET | Freq: Two times a day (BID) | ORAL | 2 refills | Status: DC | PRN

## 2018-05-07 NOTE — Progress Notes (Signed)
BH MD/PA/NP OP Progress Note  05/07/2018 3:41 PM Sandra Glover  MRN:  [id number]  Chief Complaint:  Chief Complaint    Depression; Anxiety; Follow-up     HPI: This patient is a 32 year old married white female who lives with her husband, 71-year-old son and 65-year-old daughter in [place].  She works as an Airline pilot for a [employer]  The patient returns after 2 months for treatment of depression and anxiety.  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  She states they are downsizing and could not afford to keep her.  She was pretty devastated and sad.  She has been looking and actually had a job interview today which she thinks went well.  She has been more anxious lately and has been taking the Xanax twice a day.  She is able to sleep okay denies serious depression suicidal ideation. Visit Diagnosis:    ICD-10-CM   1. Major depressive disorder, recurrent episode, moderate (HCC) F33.1     Past Psychiatric History: none  Past Medical History:  Past Medical History:  Diagnosis Date  . Anxiety   . Anxiety   . Depression   . Gestational diabetes mellitus, antepartum    gestational only  . History of anemia 2013  . History of depression    states stopped med. in 2013  . 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   right - due to kidney infection  . Pertussis   . Postcoital bleeding 07/14/2015  . Sinus infection 01/25/2013   started antibiotic 01/24/2013 x 10 days; nasal congestion, clear drainage from nose  . Tonsillar and adenoid hypertrophy 01/2013  . Vaginal irritation 03/25/2014   Mild yeast but has history of yeast, will not treat til after first trimester,try yogurt    Past Surgical History:  Procedure Laterality Date  . CHOLECYSTECTOMY  04/23/2010   laparoscopic  . COLONOSCOPY WITH PROPOFOL N/A 05/17/2016   Procedure: COLONOSCOPY WITH PROPOFOL;  Surgeon: West Bali, MD;  [facility];  Service:  Endoscopy;  Laterality: N/A;  1000  . HEMORRHOID BANDING N/A 05/17/2016   Procedure: HEMORRHOID BANDING;  Surgeon: West Bali, MD;  [facility];  Service: Endoscopy;  Laterality: N/A;  . HEMORRHOID SURGERY N/A 06/08/2016   Procedure: SIMPLE HEMORRHOIDECTOMY;  Surgeon: Ancil Linsey, MD;  Location: [facility];  Service: General;  Laterality: N/A;  . PARTIAL NEPHRECTOMY Right age 51 mos.  . TONSILLECTOMY AND ADENOIDECTOMY N/A 01/29/2013   Procedure: TONSILLECTOMY AND ADENOIDECTOMY;  Surgeon: Darletta Moll, MD;  Location: [facility];  Service: ENT;  Laterality: N/A;    Family Psychiatric History: See below  Family History:  Family History  Problem Relation Age of Onset  . COPD Mother   . Depression Mother   . Anxiety disorder Mother   . Hypertension Mother   . Irritable bowel syndrome Mother   . Arthritis Mother   . Skin cancer Mother   . Ovarian cancer Maternal Aunt   . Ovarian cancer Maternal Grandmother   . Hypertension Father   . GER disease Father   . Bipolar disorder Paternal Uncle   . Anxiety disorder Cousin   . Drug abuse Cousin   . Bipolar disorder Cousin   . Colon cancer Neg Hx     Social History:  Social History   Socioeconomic History  . Marital status: Married    Spouse name: Not on file  .  Number of children: Not on file  . Years of education: Not on file  . Highest education level: Not on file  Occupational History  . Not on file  Social Needs  . Financial resource strain: Not on file  . Food insecurity:    Worry: Not on file    Inability: Not on file  . Transportation needs:    Medical: Not on file    Non-medical: Not on file  Tobacco Use  . Smoking status: Never Smoker  . Smokeless tobacco: Never Used  . Tobacco comment: Never smoked  Substance and Sexual Activity  . Alcohol use: Yes    Alcohol/week: 0.0 oz    Comment: socially  . Drug use: No  . Sexual activity: Yes    Birth control/protection: Implant    Comment:  nexplanon implant  Lifestyle  . Physical activity:    Days per week: Not on file    Minutes per session: Not on file  . Stress: Not on file  Relationships  . Social connections:    Talks on phone: Not on file    Gets together: Not on file    Attends religious service: Not on file    Active member of club or organization: Not on file    Attends meetings of clubs or organizations: Not on file    Relationship status: Not on file  Other Topics Concern  . Not on file  Social History Narrative  . Not on file    Allergies:  Allergies  Allergen Reactions  . Lortab [Hydrocodone-Acetaminophen] Nausea And Vomiting  . Morphine And Related Rash    Metabolic Disorder Labs: No results found for: HGBA1C, MPG No results found for: PROLACTIN No results found for: CHOL, TRIG, HDL, CHOLHDL, VLDL, LDLCALC Lab Results  Component Value Date   TSH 2.553 10/13/2017    Therapeutic Level Labs: No results found for: LITHIUM No results found for: VALPROATE No components found for:  CBMZ  Current Medications: Current Outpatient Medications  Medication Sig Dispense Refill  . ALPRAZolam (XANAX) 1 MG tablet Take 1 tablet (1 mg total) by mouth 2 (two) times daily as needed for anxiety. 60 tablet 2  . ARIPiprazole (ABILIFY) 2 MG tablet Take 1 tablet (2 mg total) by mouth daily. 30 tablet 2  . etonogestrel (NEXPLANON) 68 MG IMPL implant 1 each by Subdermal route once.    Marland Kitchen. FLUoxetine (PROZAC) 40 MG capsule TAKE 1 CAPSULE(40 MG) BY MOUTH DAILY 30 capsule 2  . LINZESS 145 MCG CAPS capsule TAKE 1 CAPSULE BY MOUTH BEFORE FIRST MEAL OF THE DAY 90 capsule 1  . lisdexamfetamine (VYVANSE) 30 MG capsule Take 1 capsule (30 mg total) by mouth every morning. 30 capsule 0  . lisdexamfetamine (VYVANSE) 30 MG capsule Take 1 capsule (30 mg total) by mouth every morning. 30 capsule 0  . traZODone (DESYREL) 50 MG tablet Take 1 tablet (50 mg total) by mouth at bedtime. 30 tablet 2   No current facility-administered  medications for this visit.      Musculoskeletal: Strength & Muscle Tone: within normal limits Gait & Station: normal Patient leans: N/A  Psychiatric Specialty Exam: Review of Systems  Psychiatric/Behavioral: The patient is nervous/anxious.   All other systems reviewed and are negative.   Blood pressure 107/65, pulse 78, height 5\' 2"  (1.575 m), weight 207 lb (93.9 kg), SpO2 98 %.Body mass index is 37.86 kg/m.  General Appearance: Casual, Neat and Well Groomed  Eye Contact:  Good  Speech:  Clear  and Coherent  Volume:  Decreased  Mood:  Anxious  Affect:  Congruent  Thought Process:  Goal Directed  Orientation:  Full (Time, Place, and Person)  Thought Content: Rumination   Suicidal Thoughts:  No  Homicidal Thoughts:  No  Memory:  Immediate;   Good Recent;   Good Remote;   Good  Judgement:  Good  Insight:  Fair  Psychomotor Activity:  Normal  Concentration:  Concentration: Good and Attention Span: Good  Recall:  Good  Fund of Knowledge: Good  Language: Good  Akathisia:  No  Handed:  Right  AIMS (if indicated): not done  Assets:  Communication Skills Desire for Improvement Physical Health Resilience Social Support Talents/Skills  ADL's:  Intact  Cognition: WNL  Sleep:  Good   Screenings: PHQ2-9     Office Visit from 10/16/2017 in [facility] from [facility]13[facility]2017 [facility] from 07/30/2014 in [facility]  PHQ-2 Total Score  6  6  (Pended)   0  PHQ-9 Total Score  16  21  (Pended)   -       Assessment and Plan: This patient is a 32 year old female with a history of anxiety depression and ADHD.  She is going to a bit of a rough time right now but so far is holding her own.  She is agreeable to coming back for counseling.  She will continue Xanax 1 mg twice a day as needed for anxiety, Prozac 40 mg daily for depression, Abilify 2 mg daily for augmentation and Vyvanse 30 mg  every morning for focus.  As well as trazodone 50 mg daily at bedtime for sleep.  She will return to see me in 2 months or call sooner if needed   [name], MD 05/07/2018, 3:41 PM

## 2018-05-10 ENCOUNTER — Ambulatory Visit (HOSPITAL_COMMUNITY): Payer: BLUE CROSS/BLUE SHIELD | Admitting: Psychiatry

## 2018-06-22 ENCOUNTER — Ambulatory Visit (HOSPITAL_COMMUNITY): Payer: BLUE CROSS/BLUE SHIELD | Admitting: Psychiatry

## 2018-06-28 ENCOUNTER — Other Ambulatory Visit (HOSPITAL_COMMUNITY): Payer: Self-pay | Admitting: Psychiatry

## 2018-06-29 ENCOUNTER — Other Ambulatory Visit (HOSPITAL_COMMUNITY): Payer: Self-pay | Admitting: Psychiatry

## 2018-07-19 ENCOUNTER — Ambulatory Visit: Payer: BLUE CROSS/BLUE SHIELD | Admitting: Gastroenterology

## 2018-07-31 ENCOUNTER — Other Ambulatory Visit (HOSPITAL_COMMUNITY): Payer: Self-pay | Admitting: Psychiatry

## 2018-08-07 ENCOUNTER — Ambulatory Visit (HOSPITAL_COMMUNITY): Payer: Self-pay | Admitting: Psychiatry

## 2018-08-27 ENCOUNTER — Ambulatory Visit (HOSPITAL_COMMUNITY): Payer: BLUE CROSS/BLUE SHIELD | Admitting: Psychiatry

## 2018-08-27 ENCOUNTER — Encounter (HOSPITAL_COMMUNITY): Payer: Self-pay | Admitting: Psychiatry

## 2018-08-27 VITALS — BP 108/74 | HR 79 | Ht 62.0 in | Wt 201.0 lb

## 2018-08-27 DIAGNOSIS — Z813 Family history of other psychoactive substance abuse and dependence: Secondary | ICD-10-CM | POA: Diagnosis not present

## 2018-08-27 DIAGNOSIS — F331 Major depressive disorder, recurrent, moderate: Secondary | ICD-10-CM | POA: Diagnosis not present

## 2018-08-27 DIAGNOSIS — Z818 Family history of other mental and behavioral disorders: Secondary | ICD-10-CM

## 2018-08-27 DIAGNOSIS — F4310 Post-traumatic stress disorder, unspecified: Secondary | ICD-10-CM

## 2018-08-27 MED ORDER — TRAZODONE HCL 50 MG PO TABS: ORAL_TABLET | ORAL | 2 refills | Status: DC

## 2018-08-27 MED ORDER — LISDEXAMFETAMINE DIMESYLATE 30 MG PO CAPS: 30.0000 mg | ORAL_CAPSULE | ORAL | 0 refills | Status: DC

## 2018-08-27 MED ORDER — ARIPIPRAZOLE 2 MG PO TABS: 2.0000 mg | ORAL_TABLET | Freq: Every day | ORAL | 2 refills | Status: DC

## 2018-08-27 MED ORDER — ALPRAZOLAM 1 MG PO TABS: 1.0000 mg | ORAL_TABLET | Freq: Two times a day (BID) | ORAL | 2 refills | Status: DC | PRN

## 2018-08-27 MED ORDER — FLUOXETINE HCL 40 MG PO CAPS: ORAL_CAPSULE | ORAL | 2 refills | Status: DC

## 2018-08-27 NOTE — Progress Notes (Signed)
BH MD/PA/NP OP Progress Note  08/27/2018 11:50 AM Sandra Glover  MRN:  [id number]  Chief Complaint:  Chief Complaint    Depression; Anxiety; Follow-up     HPI: This patient is a 32 year old married white female who lives with her husband, 73-year-old son and 83-year-old daughter in [place].  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  The patient returns after 3 months for treatment of depression and anxiety.  Last time she had lost her job due to downsizing.  However she started another job with the city of [place] fairly quickly afterwards and is going very well.  She is very happy there and is learning a lot of new things.  She is no longer taking the Xanax very often since her anxiety is lessened.  Her mood is good and she is sleeping well.  She denies serious depression or suicidal ideation Visit Diagnosis:    ICD-10-CM   1. Major depressive disorder, recurrent episode, moderate (HCC) F33.1   2. PTSD (post-traumatic stress disorder) F43.10     Past Psychiatric History: none  Past Medical History:  Past Medical History:  Diagnosis Date  . Anxiety   . Anxiety   . Depression   . Gestational diabetes mellitus, antepartum    gestational only  . History of anemia 2013  . History of depression    states stopped med. in 2013  . History of partial nephrectomy age 21 mos.   right - due to kidney infection  . Pertussis   . Postcoital bleeding 07/14/2015  . Sinus infection 01/25/2013   started antibiotic 01/24/2013 x 10 days; nasal congestion, clear drainage from nose  . Tonsillar and adenoid hypertrophy 01/2013  . Vaginal irritation 03/25/2014   Mild yeast but has history of yeast, will not treat til after first trimester,try yogurt    Past Surgical History:  Procedure Laterality Date  . CHOLECYSTECTOMY  04/23/2010   laparoscopic  . COLONOSCOPY WITH PROPOFOL N/A 05/17/2016   Procedure: COLONOSCOPY WITH PROPOFOL;  Surgeon: West Bali, MD;  [facility];  Service: Endoscopy;  Laterality: N/A;  1000  . HEMORRHOID BANDING N/A 05/17/2016   Procedure: HEMORRHOID BANDING;  Surgeon: West Bali, MD;  [facility];  Service: Endoscopy;  Laterality: N/A;  . HEMORRHOID SURGERY N/A 06/08/2016   Procedure: SIMPLE HEMORRHOIDECTOMY;  Surgeon: Ancil Linsey, MD;  Location: [facility];  Service: General;  Laterality: N/A;  . PARTIAL NEPHRECTOMY Right age 6 mos.  . TONSILLECTOMY AND ADENOIDECTOMY N/A 01/29/2013   Procedure: TONSILLECTOMY AND ADENOIDECTOMY;  Surgeon: Darletta Moll, MD;  Location: [facility];  Service: ENT;  Laterality: N/A;    Family Psychiatric History: See below  Family History:  Family History  Problem Relation Age of Onset  . COPD Mother   . Depression Mother   . Anxiety disorder Mother   . Hypertension Mother   . Irritable bowel syndrome Mother   . Arthritis Mother   . Skin cancer Mother   . Ovarian cancer Maternal Aunt   . Ovarian cancer Maternal Grandmother   . Hypertension Father   . GER disease Father   . Bipolar disorder Paternal Uncle   . Anxiety disorder Cousin   . Drug abuse Cousin   . Bipolar disorder Cousin   . Colon cancer Neg Hx     Social History:  Social History   Socioeconomic History  . Marital status: Married  Spouse name: Not on file  . Number of children: Not on file  . Years of education: Not on file  . Highest education level: Not on file  Occupational History  . Not on file  Social Needs  . Financial resource strain: Not on file  . Food insecurity:    Worry: Not on file    Inability: Not on file  . Transportation needs:    Medical: Not on file    Non-medical: Not on file  Tobacco Use  . Smoking status: Never Smoker  . Smokeless tobacco: Never Used  . Tobacco comment: Never smoked  Substance and Sexual Activity  . Alcohol use: Yes    Alcohol/week: 0.0 standard drinks    Comment: socially  . Drug use: No  . Sexual  activity: Yes    Birth control/protection: Implant    Comment: nexplanon implant  Lifestyle  . Physical activity:    Days per week: Not on file    Minutes per session: Not on file  . Stress: Not on file  Relationships  . Social connections:    Talks on phone: Not on file    Gets together: Not on file    Attends religious service: Not on file    Active member of club or organization: Not on file    Attends meetings of clubs or organizations: Not on file    Relationship status: Not on file  Other Topics Concern  . Not on file  Social History Narrative  . Not on file    Allergies:  Allergies  Allergen Reactions  . Lortab [Hydrocodone-Acetaminophen] Nausea And Vomiting  . Morphine And Related Rash    Metabolic Disorder Labs: No results found for: HGBA1C, MPG No results found for: PROLACTIN No results found for: CHOL, TRIG, HDL, CHOLHDL, VLDL, LDLCALC Lab Results  Component Value Date   TSH 2.553 10/13/2017    Therapeutic Level Labs: No results found for: LITHIUM No results found for: VALPROATE No components found for:  CBMZ  Current Medications: Current Outpatient Medications  Medication Sig Dispense Refill  . ALPRAZolam (XANAX) 1 MG tablet Take 1 tablet (1 mg total) by mouth 2 (two) times daily as needed for anxiety. 60 tablet 2  . ARIPiprazole (ABILIFY) 2 MG tablet Take 1 tablet (2 mg total) by mouth daily. 30 tablet 2  . etonogestrel (NEXPLANON) 68 MG IMPL implant 1 each by Subdermal route once.    Marland Kitchen. FLUoxetine (PROZAC) 40 MG capsule TAKE 1 CAPSULE(40 MG) BY MOUTH DAILY 30 capsule 0  . FLUoxetine (PROZAC) 40 MG capsule TAKE 1 CAPSULE(40 MG) BY MOUTH DAILY 30 capsule 2  . LINZESS 145 MCG CAPS capsule TAKE 1 CAPSULE BY MOUTH BEFORE FIRST MEAL OF THE DAY 90 capsule 1  . lisdexamfetamine (VYVANSE) 30 MG capsule Take 1 capsule (30 mg total) by mouth every morning. 30 capsule 0  . lisdexamfetamine (VYVANSE) 30 MG capsule Take 1 capsule (30 mg total) by mouth every  morning. 30 capsule 0  . traZODone (DESYREL) 50 MG tablet TAKE 1 TABLET(50 MG) BY MOUTH AT BEDTIME 30 tablet 2  . lisdexamfetamine (VYVANSE) 30 MG capsule Take 1 capsule (30 mg total) by mouth every morning. 30 capsule 0   No current facility-administered medications for this visit.      Musculoskeletal: Strength & Muscle Tone: within normal limits Gait & Station: normal Patient leans: N/A  Psychiatric Specialty Exam: Review of Systems  All other systems reviewed and are negative.   Blood pressure 108/74, pulse  79, height 5\' 2"  (1.575 m), weight 201 lb (91.2 kg), SpO2 98 %.Body mass index is 36.76 kg/m.  General Appearance: Casual and Fairly Groomed  Eye Contact:  Good  Speech:  Clear and Coherent  Volume:  Normal  Mood:  Euthymic  Affect:  Congruent  Thought Process:  Goal Directed  Orientation:  Full (Time, Place, and Person)  Thought Content: WDL   Suicidal Thoughts:  No  Homicidal Thoughts:  No  Memory:  Immediate;   Good Recent;   Good Remote;   Good  Judgement:  Good  Insight:  Fair  Psychomotor Activity:  Normal  Concentration:  Concentration: Good and Attention Span: Good  Recall:  Good  Fund of Knowledge: Good  Language: Good  Akathisia:  No  Handed:  Right  AIMS (if indicated): not done  Assets:  Communication Skills Desire for Improvement Physical Health Resilience Social Support Talents/Skills  ADL's:  Intact  Cognition: WNL  Sleep:  Good   Screenings: PHQ2-9     Office Visit from 10/16/2017 in [facility] from 10/13/2017 in [facility] from 07/30/2014 in [facility]  PHQ-2 Total Score  6  6  (Pended)   0  PHQ-9 Total Score  16  21  (Pended)   -       Assessment and Plan: This patient is a 32 year old female with a history of depression anxiety and ADD.  She is doing quite well since she got her new job.  She will continue Prozac 40 mg daily for  depression, Abilify 2 mg daily for augmentation, Xanax 1 mg 2 times daily as needed for anxiety and Vyvanse 30 mg each morning for focus and trazodone 50 mg at bedtime for sleep.  She will return to see me in 3 months   [name], MD 08/27/2018, 11:50 AM

## 2018-09-25 ENCOUNTER — Ambulatory Visit (HOSPITAL_COMMUNITY): Payer: BLUE CROSS/BLUE SHIELD | Admitting: Psychiatry

## 2018-09-27 ENCOUNTER — Ambulatory Visit (INDEPENDENT_AMBULATORY_CARE_PROVIDER_SITE_OTHER): Payer: PRIVATE HEALTH INSURANCE | Admitting: Psychiatry

## 2018-09-27 ENCOUNTER — Encounter (HOSPITAL_COMMUNITY): Payer: Self-pay | Admitting: Psychiatry

## 2018-09-27 DIAGNOSIS — F332 Major depressive disorder, recurrent severe without psychotic features: Secondary | ICD-10-CM | POA: Diagnosis not present

## 2018-09-27 NOTE — Progress Notes (Signed)
THERAPIST PROGRESS NOTE  Session Time:   Thursday 09/27/2108 1:14 PM - 2:05 PM   Participation Level: Active    Behavioral Response: CasualAlert/less depressed, pleasant, smiles today  Type of Therapy: Individual Therapy               Treatment Goals addressed:  Learn and implement strategies to overcome depression, process grief and loss issues      Interventions: CBT and Supportive  Summary: Sandra Glover is a 32 y.o. female who presents with a history of symptoms of depression beginning in 2009 a few days after her wedding when her aunt with whom she was very close died suddenly. Since that time she has experienced various stressors and recurrent episodes of depression. She has received services in this practice since 2015. She sees psychiatrist Dr. Tenny Craw for medication management. She last was seen for psychotherapy in December 2018. She is resuming services due to increased symptoms of depression and anxiety in the past 3 weeks. Her mother was diagnosed with thyroid cancer on 09/05/2018. Her  maternal grandmother died on Sep 25, 2018 due to cancer.  Both of patient's children had separate conditions that required separate ER visits the week after grandmother's funeral.  She also reports her husband experienced depression along with grief and loss issues related to the 1st anniversary of his father's death. Patient reports additional stress related to school and missing her midterms due to grandmother's death and felt overwhelmed trying to make up the work. She was unable to meet the deadline and reluctantly eventually decided to withdraw for the semester to avoid lowering her GPA. she was out of work for 3 months earlier this year but began working another job in September and is still trying to recover financially. She reports receiving financial help from mother-in-law but feeling guilty regarding this. Patient reports feeling overwhelmed, neglecting self-care (washing hair, showering),  having little to no interest in activities, excessive sleeping, poor motivation, tearfulness, fatigue, poor concentration, nervousness and worry. She reports becoming very emotional and upset this morning and was unable to go to work and feels guilty for being out. She reports having fleeting suicidal ideations one day earlier this week but calling her husband who helped her identify reasons to live. Patient denies having any suicidal ideations since that time and denies any plan or intent to harm self.     Suicidal/Homicidal: Patient reports fleeting suicidal ideations earlier this week and contemplating how she could mix pills but called husband who helped her identify reasons to live. She denies current suicidal ideations and no intent or plan. Patient does agree to ask husband today to manage her medications at this time. Patient also is provided with crisis contact information and agrees to call this practice, call 911, or have someone take her to ER should symptoms worsen.  Therapist Response: Reviewed symptoms, administered PHQ-9 and GAD-7, praised patient's recognition of her need for services, discussed stressors, facilitated expression of thoughts and feelings, validated feelings, discussed lapse and relapse, discussed connection between depression and grief, discussed referral to see psychiatrist Dr. Tenny Craw for an earlier appointment for medication management and facilitated scheduling appointment, assisted patient identify activities consistent with her values and develop plan to increase behavioral activation (clean kitchen daily) and improve self-care (shower daily), assigned patient to implement plan developed in session  Plan: Patient is scheduled to see psychiatrist Dr. Tenny Craw on Friday 09/28/2018 for medication management. She agrees to return for psychotherapy appointment in one week.   Diagnosis: Axis  I: MDD/PTSD    Axis II: No diagnosis    Sammy Cassar, LCSW 09/27/2018

## 2018-09-28 ENCOUNTER — Encounter (HOSPITAL_COMMUNITY): Payer: Self-pay | Admitting: Psychiatry

## 2018-09-28 ENCOUNTER — Ambulatory Visit (INDEPENDENT_AMBULATORY_CARE_PROVIDER_SITE_OTHER): Payer: PRIVATE HEALTH INSURANCE | Admitting: Psychiatry

## 2018-09-28 VITALS — BP 112/78 | HR 62 | Ht 62.0 in | Wt 197.0 lb

## 2018-09-28 DIAGNOSIS — F332 Major depressive disorder, recurrent severe without psychotic features: Secondary | ICD-10-CM | POA: Diagnosis not present

## 2018-09-28 DIAGNOSIS — F4310 Post-traumatic stress disorder, unspecified: Secondary | ICD-10-CM

## 2018-09-28 DIAGNOSIS — F419 Anxiety disorder, unspecified: Secondary | ICD-10-CM | POA: Diagnosis not present

## 2018-09-28 DIAGNOSIS — G4700 Insomnia, unspecified: Secondary | ICD-10-CM | POA: Diagnosis not present

## 2018-09-28 MED ORDER — ARIPIPRAZOLE 2 MG PO TABS: 2.0000 mg | ORAL_TABLET | Freq: Every day | ORAL | 2 refills | Status: DC

## 2018-09-28 MED ORDER — FLUOXETINE HCL 40 MG PO CAPS: ORAL_CAPSULE | ORAL | 2 refills | Status: DC

## 2018-09-28 MED ORDER — LISDEXAMFETAMINE DIMESYLATE 30 MG PO CAPS: 30.0000 mg | ORAL_CAPSULE | ORAL | 0 refills | Status: DC

## 2018-09-28 MED ORDER — ALPRAZOLAM 1 MG PO TABS: 1.0000 mg | ORAL_TABLET | Freq: Two times a day (BID) | ORAL | 2 refills | Status: DC | PRN

## 2018-09-28 MED ORDER — FLUOXETINE HCL 20 MG PO CAPS: 20.0000 mg | ORAL_CAPSULE | Freq: Every day | ORAL | 2 refills | Status: DC

## 2018-09-28 MED ORDER — TRAZODONE HCL 50 MG PO TABS: ORAL_TABLET | ORAL | 2 refills | Status: DC

## 2018-09-28 NOTE — Progress Notes (Signed)
BH MD/PA/NP OP Progress Note  09/28/2018 9:13 AM Sandra Glover  MRN:  [id number]  Chief Complaint:  Chief Complaint    Depression; Anxiety; ADHD; Follow-up     HPI: This patient is a 32 year old married white female who lives with her husband 90-year-old son and 74-year-old daughter in [place].  She works for the city of [place] in the [employer].  The patient returns today as a work in.  She was last seen about a month ago.  At that time she just got a new job and seemed very upbeat and happy.  However since then several things have occurred.  Her grandmother died of cancer earlier this month.  Around the same time her mother was diagnosed with thyroid cancer.  Both of her children had visits to the emergency room for various illnesses.  She was very close to her grandmother and the death has been extremely difficult for her.  Over the last couple of weeks she has had increased symptoms of depression and fatigue.  All she wants to do a sleep and is hard for her to get going.  A few days ago she had suicidal ideation but her husband "talked me out of it."  She denies these thoughts today.  She looks disheveled and tired.  She denies auditory visual hallucinations but does feel very sad and it has not been able to go into work and function much of this week. Visit Diagnosis:    ICD-10-CM   1. Severe episode of recurrent major depressive disorder, without psychotic features (HCC) F33.2   2. PTSD (post-traumatic stress disorder) F43.10     Past Psychiatric History: Long-term outpatient treatment in our office  Past Medical History:  Past Medical History:  Diagnosis Date  . Anxiety   . Anxiety   . Depression   . Gestational diabetes mellitus, antepartum    gestational only  . History of anemia 2013  . History of depression    states stopped med. in 2013  . 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 Pertussis   .  Postcoital bleeding 07/14/2015  . Sinus infection 01/25/2013   started antibiotic 01/24/2013 x 10 days; nasal congestion, clear drainage from nose  . Tonsillar and adenoid hypertrophy 01/2013  . Vaginal irritation 03/25/2014   Mild yeast but has history of yeast, will not treat til after first trimester,try yogurt    Past Surgical History:  Procedure Laterality Date  . CHOLECYSTECTOMY  04/23/2010   laparoscopic  . COLONOSCOPY WITH PROPOFOL N/A 05/17/2016   Procedure: COLONOSCOPY WITH PROPOFOL;  Surgeon: West Bali, MD;  [facility];  Service: Endoscopy;  Laterality: N/A;  1000  . HEMORRHOID BANDING N/A 05/17/2016   Procedure: HEMORRHOID BANDING;  Surgeon: West Bali, MD;  [facility];  Service: Endoscopy;  Laterality: N/A;  . HEMORRHOID SURGERY N/A 06/08/2016   Procedure: SIMPLE HEMORRHOIDECTOMY;  Surgeon: Ancil Linsey, MD;  Location: [facility];  Service: General;  Laterality: N/A;  . PARTIAL NEPHRECTOMY Right age 70 mos.  . TONSILLECTOMY AND ADENOIDECTOMY N/A 01/29/2013   Procedure: TONSILLECTOMY AND ADENOIDECTOMY;  Surgeon: Darletta Moll, MD;  Location: [facility];  Service: ENT;  Laterality: N/A;    Family Psychiatric History: See below  Family History:  Family History  Problem Relation Age of Onset  . COPD Mother   . Depression Mother   . Anxiety disorder Mother   .  Hypertension Mother   . Irritable bowel syndrome Mother   . Arthritis Mother   . Skin cancer Mother   . Ovarian cancer Maternal Aunt   . Ovarian cancer Maternal Grandmother   . Hypertension Father   . GER disease Father   . Bipolar disorder Paternal Uncle   . Anxiety disorder Cousin   . Drug abuse Cousin   . Bipolar disorder Cousin   . Colon cancer Neg Hx     Social History:  Social History   Socioeconomic History  . Marital status: Married    Spouse name: Not on file  . Number of children: Not on file  . Years of education: Not on file  . Highest education level:  Not on file  Occupational History  . Not on file  Social Needs  . Financial resource strain: Not on file  . Food insecurity:    Worry: Not on file    Inability: Not on file  . Transportation needs:    Medical: Not on file    Non-medical: Not on file  Tobacco Use  . Smoking status: Never Smoker  . Smokeless tobacco: Never Used  . Tobacco comment: Never smoked  Substance and Sexual Activity  . Alcohol use: Yes    Alcohol/week: 0.0 standard drinks    Comment: socially  . Drug use: No  . Sexual activity: Yes    Birth control/protection: Implant    Comment: nexplanon implant  Lifestyle  . Physical activity:    Days per week: Not on file    Minutes per session: Not on file  . Stress: Not on file  Relationships  . Social connections:    Talks on phone: Not on file    Gets together: Not on file    Attends religious service: Not on file    Active member of club or organization: Not on file    Attends meetings of clubs or organizations: Not on file    Relationship status: Not on file  Other Topics Concern  . Not on file  Social History Narrative  . Not on file    Allergies:  Allergies  Allergen Reactions  . Lortab [Hydrocodone-Acetaminophen] Nausea And Vomiting  . Morphine And Related Rash    Metabolic Disorder Labs: No results found for: HGBA1C, MPG No results found for: PROLACTIN No results found for: CHOL, TRIG, HDL, CHOLHDL, VLDL, LDLCALC Lab Results  Component Value Date   TSH 2.553 10/13/2017    Therapeutic Level Labs: No results found for: LITHIUM No results found for: VALPROATE No components found for:  CBMZ  Current Medications: Current Outpatient Medications  Medication Sig Dispense Refill  . ALPRAZolam (XANAX) 1 MG tablet Take 1 tablet (1 mg total) by mouth 2 (two) times daily as needed for anxiety. 60 tablet 2  . ARIPiprazole (ABILIFY) 2 MG tablet Take 1 tablet (2 mg total) by mouth daily. 30 tablet 2  . etonogestrel (NEXPLANON) 68 MG IMPL implant  1 each by Subdermal route once.    Marland Kitchen FLUoxetine (PROZAC) 40 MG capsule TAKE 1 CAPSULE(40 MG) BY MOUTH DAILY 30 capsule 2  . FLUoxetine (PROZAC) 40 MG capsule TAKE 1 CAPSULE(40 MG) BY MOUTH DAILY 30 capsule 2  . LINZESS 145 MCG CAPS capsule TAKE 1 CAPSULE BY MOUTH BEFORE FIRST MEAL OF THE DAY 90 capsule 1  . lisdexamfetamine (VYVANSE) 30 MG capsule Take 1 capsule (30 mg total) by mouth every morning. 30 capsule 0  . lisdexamfetamine (VYVANSE) 30 MG capsule Take 1 capsule (  30 mg total) by mouth every morning. 30 capsule 0  . lisdexamfetamine (VYVANSE) 30 MG capsule Take 1 capsule (30 mg total) by mouth every morning. 30 capsule 0  . Lorcaserin HCl (BELVIQ) 10 MG TABS Take by mouth.    . traZODone (DESYREL) 50 MG tablet TAKE 1 TABLET(50 MG) BY MOUTH AT BEDTIME 30 tablet 2  . FLUoxetine (PROZAC) 20 MG capsule Take 1 capsule (20 mg total) by mouth daily. 30 capsule 2   No current facility-administered medications for this visit.      Musculoskeletal: Strength & Muscle Tone: within normal limits Gait & Station: normal Patient leans: N/A  Psychiatric Specialty Exam: Review of Systems  Constitutional: Positive for malaise/fatigue.  Psychiatric/Behavioral: Positive for depression. The patient is nervous/anxious.   All other systems reviewed and are negative.   Blood pressure 112/78, pulse 62, height 5\' 2"  (1.575 m), weight 197 lb (89.4 kg), SpO2 98 %.Body mass index is 36.03 kg/m.  General Appearance: Casual and Disheveled  Eye Contact:  Fair  Speech:  Slow  Volume:  Decreased  Mood:  Anxious and Depressed  Affect:  Constricted and Flat  Thought Process:  Goal Directed  Orientation:  Full (Time, Place, and Person)  Thought Content: Rumination   Suicidal Thoughts:  Yes.  without intent/plan  Homicidal Thoughts:  No  Memory:  Immediate;   Good Recent;   Good Remote;   Good  Judgement:  Good  Insight:  Good  Psychomotor Activity:  Decreased  Concentration:  Concentration: Poor and  Attention Span: Poor  Recall:  Good  Fund of Knowledge: Good  Language: Good  Akathisia:  No  Handed:  Right  AIMS (if indicated): not done  Assets:  Communication Skills Desire for Improvement Physical Health Resilience Social Support Talents/Skills  ADL's:  Intact  Cognition: WNL  Sleep:  Good   Screenings: GAD-7     Coun[facility]  Total GAD-7 Score  18    PHQ2-9     Counselor from 09/27/2018 in [facility] Office Visit from 10/16/2017 in [facility] Counselor from 10/13/2017 in [facility] Nutrition from 07/30/2014 in [facility]  PHQ-2 Total Score  6  6  6   (Pended)   0  PHQ-9 Total Score  21  16  21   (Pended)   -       Assessment and Plan: This patient is a 32 year old female with a history of depression and ADD.  In the past she has self-harm but has not done any of this recently despite passive suicidal ideation.  She denies any thoughts of self-harm or suicide today.  She is far more depressed since her grandmother died earlier in the month.  I suggest we increase her Prozac to 60 mg daily and she agrees.  She will continue Abilify 2 mg daily for augmentation and Xanax 1 mg twice daily for anxiety as well as Vyvanse 30 mg every morning for focus.  She does have trazodone but has not used it much because she is sleeping so much on her own.  She will return to see me in 2 weeks or call sooner if needed.  She will continue her counseling with Peggy Bynum.   [name], MD 09/28/2018, 9:13 AM

## 2018-10-01 ENCOUNTER — Telehealth (HOSPITAL_COMMUNITY): Payer: Self-pay | Admitting: *Deleted

## 2018-10-01 ENCOUNTER — Other Ambulatory Visit (HOSPITAL_COMMUNITY): Payer: Self-pay | Admitting: Psychiatry

## 2018-10-01 MED ORDER — AMPHETAMINE-DEXTROAMPHETAMINE 30 MG PO TABS: 30.0000 mg | ORAL_TABLET | ORAL | 0 refills | Status: DC

## 2018-10-01 NOTE — Telephone Encounter (Signed)
Dr Tenny Craw Patient called because the Vyvanse is to expensive & she asked if we had any coupons or discount cards/ Even with the discount it's still too expensive $215.00 The Rs recommend that a generic may be more cost effective & the suggested Adderall. Patient would like to try the Adderall  Is more cost effective & the Vyvanse is to expensive

## 2018-10-01 NOTE — Telephone Encounter (Signed)
I have a company coupon

## 2018-10-04 ENCOUNTER — Ambulatory Visit (INDEPENDENT_AMBULATORY_CARE_PROVIDER_SITE_OTHER): Payer: PRIVATE HEALTH INSURANCE | Admitting: Psychiatry

## 2018-10-04 DIAGNOSIS — F332 Major depressive disorder, recurrent severe without psychotic features: Secondary | ICD-10-CM | POA: Diagnosis not present

## 2018-10-04 NOTE — Progress Notes (Signed)
THERAPIST PROGRESS NOTE  Session Time:   Thursday 10/04/2018 2:12 PM - 3:10 PM  Participation Level: Active    Behavioral Response: CasualAlert/less depressed,   Type of Therapy: Individual Therapy               Treatment Goals addressed:  Learn and implement strategies to overcome depression, process grief and loss issues         Interventions: CBT and Supportive  Summary: Sandra Glover is a 31 y.o. female who presents with a history of symptoms of depression beginning in 2009 a few days after her wedding when her aunt with whom she was very close died suddenly. Since that time she has experienced various stressors and recurrent episodes of depression. She has received services in this practice since 2015. She sees psychiatrist Dr. Tenny Craw for medication management. She last was seen for psychotherapy in December 2018. She is resuming services due to increased symptoms of depression and anxiety in the past 3 weeks. Her mother was diagnosed with thyroid cancer on 09/05/2018. Her  maternal grandmother died on 2018-09-28 due to cancer.  Both of patient's children had separate conditions that required separate ER visits the week after grandmother's funeral.  She also reports her husband experienced depression along with grief and loss issues related to the 1st anniversary of his father's death. Patient reports additional stress related to school and missing her midterms due to grandmother's death and felt overwhelmed trying to make up the work. She was unable to meet the deadline and reluctantly eventually decided to withdraw for the semester to avoid lowering her GPA. she was out of work for 3 months earlier this year but began working another job in September and is still trying to recover financially. She reports receiving financial help from mother-in-law but feeling guilty regarding this. Patient reports feeling overwhelmed, neglecting self-care (washing hair, showering), having  little to no interest in activities, excessive sleeping, poor motivation, tearfulness, fatigue, poor concentration, nervousness and worry. She reports becoming very emotional and upset this morning and was unable to go to work and feels guilty for being out. She reports having fleeting suicidal ideations one day earlier this week but calling her husband who helped her identify reasons to live. Patient denies having any suicidal ideations since that time and denies any plan or intent to harm self.   Patient last was seen a week ago. She reports feeling a little better since last session and taking medication as prescribed by psychiatrist Dr. Tenny Craw. She has increased efforts regarding cleaning her kitchen ( cleaned 5/7 days) and improving self-care ( took shower 3/7 days). She still reports having days where she just wants to stay in the bed. She also reports decreased appetite and fatigue. She reports increased anxiety and panic like symptoms triggered by husband's recent health issues (high blood pressure and liver enzyme levels being off). She reports using deep breathing and self-talk to manage.  She is feeling a little better about this as husband is addressing these issues with his medical provider. Patient has continued to work and reports this helps sometimes as it is a distraction. She continues to worry about mother and has unresolved grief/loss issues regarding grandmother.     Suicidal/Homicidal: No  Therapist Response: Reviewed symptoms, praised and reinforced patient's increased activity and improved self care, discussed effects on mood, developed treatment plan, assisted patient identify her values and ways to increase behavioral activation by pursuing activities congruent with her values,  assisted patient develop plan to include: doing an activity with her children for an hour on Saturdays, doing a couple's devotional with husband nightly, discussed role of self-care in managing depression and  assisted patient develop plan to improve self-care ( walk to mailbox every other day, avoid skipping meals and continue efforts regarding showering daily), assisted patient identify and address thoughts and processes that may inhibit completion of homework, assisted patient develop list of benefits of completing homework and consequences for not completing homework, assigned her to read lists daily, assigned patient to implement plans developed in session  Plan: Return in 1-2 weeks  Diagnosis: Axis I: MDD/PTSD    Axis II: No diagnosis    Antanasia Kaczynski, LCSW 10/04/2018

## 2018-10-12 ENCOUNTER — Ambulatory Visit (HOSPITAL_COMMUNITY): Payer: PRIVATE HEALTH INSURANCE | Admitting: Psychiatry

## 2018-10-15 ENCOUNTER — Ambulatory Visit (INDEPENDENT_AMBULATORY_CARE_PROVIDER_SITE_OTHER): Payer: PRIVATE HEALTH INSURANCE | Admitting: Psychiatry

## 2018-10-15 ENCOUNTER — Encounter (HOSPITAL_COMMUNITY): Payer: Self-pay | Admitting: Psychiatry

## 2018-10-15 VITALS — BP 197/86 | HR 90 | Ht 62.0 in | Wt 192.0 lb

## 2018-10-15 DIAGNOSIS — F332 Major depressive disorder, recurrent severe without psychotic features: Secondary | ICD-10-CM

## 2018-10-15 DIAGNOSIS — F4310 Post-traumatic stress disorder, unspecified: Secondary | ICD-10-CM

## 2018-10-15 MED ORDER — AMPHETAMINE-DEXTROAMPHETAMINE 30 MG PO TABS: 30.0000 mg | ORAL_TABLET | ORAL | 0 refills | Status: DC

## 2018-10-15 NOTE — Progress Notes (Signed)
BH MD/PA/NP OP Progress Note  10/15/2018 11:41 AM Sandra Glover  MRN:  [id number]  Chief Complaint:  Chief Complaint    Depression; Anxiety; Follow-up     HPI: This patient is a 32 year old married white female who lives with her husband and 2 children in [place].  She works for the city of [place] in the [employer].  The patient returns today as a work in she was last seen 2 weeks ago.  At that time she had gotten very depressed because her grandmother had died recently her mother was diagnosed with thyroid cancer in both of her children were sick.  I did increase her Prozac and she is seeing Florencia Reasons here for counseling.  She seems to be doing better.  She is neatly dressed and groomed today.  She denies suicidal ideation.  She is called in the interim and we have changed her Vyvanse to Adderall due to cost and she states that she is focusing well with the Adderall.  She has been going to work and focusing well.  She seems to have made quite a turnaround.  She does state that her mother is going into [facility] this week for thyroid surgery and she is going with her and feels positive about the outcome. Visit Diagnosis:    ICD-10-CM   1. Severe episode of recurrent major depressive disorder, without psychotic features (HCC) F33.2   2. PTSD (post-traumatic stress disorder) F43.10     Past Psychiatric History: Long-term outpatient treatment in our office  Past Medical History:  Past Medical History:  Diagnosis Date  . Anxiety   . Anxiety   . Depression   . Gestational diabetes mellitus, antepartum    gestational only  . History of anemia 2013  . History of depression    states stopped med. in 2013  . History of partial nephrectomy age 43 mos.   right - due to kidney infection  . Pertussis   . Postcoital bleeding 07/14/2015  . Sinus infection 01/25/2013   started antibiotic 01/24/2013 x 10 days; nasal congestion, clear drainage from nose  .  Tonsillar and adenoid hypertrophy 01/2013  . Vaginal irritation 03/25/2014   Mild yeast but has history of yeast, will not treat til after first trimester,try yogurt    Past Surgical History:  Procedure Laterality Date  . CHOLECYSTECTOMY  04/23/2010   laparoscopic  . COLONOSCOPY WITH PROPOFOL N/A 05/17/2016   Procedure: COLONOSCOPY WITH PROPOFOL;  Surgeon: West Bali, MD;  [facility];  Service: Endoscopy;  Laterality: N/A;  1000  . HEMORRHOID BANDING N/A 05/17/2016   Procedure: HEMORRHOID BANDING;  Surgeon: West Bali, MD;  [facility];  Service: Endoscopy;  Laterality: N/A;  . HEMORRHOID SURGERY N/A 06/08/2016   Procedure: SIMPLE HEMORRHOIDECTOMY;  Surgeon: Ancil Linsey, MD;  Location: [facility];  Service: General;  Laterality: N/A;  . PARTIAL NEPHRECTOMY Right age 57 mos.  . TONSILLECTOMY AND ADENOIDECTOMY N/A 01/29/2013   Procedure: TONSILLECTOMY AND ADENOIDECTOMY;  Surgeon: Darletta Moll, MD;  Location: [facility];  Service: ENT;  Laterality: N/A;    Family Psychiatric History: See below  Family History:  Family History  Problem Relation Age of Onset  . COPD Mother   . Depression Mother   . Anxiety disorder Mother   . Hypertension Mother   . Irritable bowel syndrome Mother   . Arthritis Mother   . Skin cancer Mother   . Ovarian cancer Maternal  Aunt   . Ovarian cancer Maternal Grandmother   . Hypertension Father   . GER disease Father   . Bipolar disorder Paternal Uncle   . Anxiety disorder Cousin   . Drug abuse Cousin   . Bipolar disorder Cousin   . Colon cancer Neg Hx     Social History:  Social History   Socioeconomic History  . Marital status: Married    Spouse name: Not on file  . Number of children: Not on file  . Years of education: Not on file  . Highest education level: Not on file  Occupational History  . Not on file  Social Needs  . Financial resource strain: Not on file  . Food insecurity:    Worry: Not on  file    Inability: Not on file  . Transportation needs:    Medical: Not on file    Non-medical: Not on file  Tobacco Use  . Smoking status: Never Smoker  . Smokeless tobacco: Never Used  . Tobacco comment: Never smoked  Substance and Sexual Activity  . Alcohol use: Yes    Alcohol/week: 0.0 standard drinks    Comment: socially  . Drug use: No  . Sexual activity: Yes    Birth control/protection: Implant    Comment: nexplanon implant  Lifestyle  . Physical activity:    Days per week: Not on file    Minutes per session: Not on file  . Stress: Not on file  Relationships  . Social connections:    Talks on phone: Not on file    Gets together: Not on file    Attends religious service: Not on file    Active member of club or organization: Not on file    Attends meetings of clubs or organizations: Not on file    Relationship status: Not on file  Other Topics Concern  . Not on file  Social History Narrative  . Not on file    Allergies:  Allergies  Allergen Reactions  . Lortab [Hydrocodone-Acetaminophen] Nausea And Vomiting  . Morphine And Related Rash    Metabolic Disorder Labs: No results found for: HGBA1C, MPG No results found for: PROLACTIN No results found for: CHOL, TRIG, HDL, CHOLHDL, VLDL, LDLCALC Lab Results  Component Value Date   TSH 2.553 10/13/2017    Therapeutic Level Labs: No results found for: LITHIUM No results found for: VALPROATE No components found for:  CBMZ  Current Medications: Current Outpatient Medications  Medication Sig Dispense Refill  . ALPRAZolam (XANAX) 1 MG tablet Take 1 tablet (1 mg total) by mouth 2 (two) times daily as needed for anxiety. 60 tablet 2  . amphetamine-dextroamphetamine (ADDERALL) 30 MG tablet Take 1 tablet by mouth every morning. 30 tablet 0  . ARIPiprazole (ABILIFY) 2 MG tablet Take 1 tablet (2 mg total) by mouth daily. 30 tablet 2  . etonogestrel (NEXPLANON) 68 MG IMPL implant 1 each by Subdermal route once.    Marland Kitchen  FLUoxetine (PROZAC) 20 MG capsule Take 1 capsule (20 mg total) by mouth daily. 30 capsule 2  . FLUoxetine (PROZAC) 40 MG capsule TAKE 1 CAPSULE(40 MG) BY MOUTH DAILY 30 capsule 2  . LINZESS 145 MCG CAPS capsule TAKE 1 CAPSULE BY MOUTH BEFORE FIRST MEAL OF THE DAY 90 capsule 1  . Lorcaserin HCl (BELVIQ) 10 MG TABS Take by mouth.    . traZODone (DESYREL) 50 MG tablet TAKE 1 TABLET(50 MG) BY MOUTH AT BEDTIME 30 tablet 2  . amphetamine-dextroamphetamine (ADDERALL) 30 MG  tablet Take 1 tablet by mouth every morning. 30 tablet 0   No current facility-administered medications for this visit.      Musculoskeletal: Strength & Muscle Tone: within normal limits Gait & Station: normal Patient leans: N/A  Psychiatric Specialty Exam: Review of Systems  All other systems reviewed and are negative.   Blood pressure (!) 197/86, pulse 90, height 5\' 2"  (1.575 m), weight 192 lb (87.1 kg), SpO2 97 %.Body mass index is 35.12 kg/m.  General Appearance: Casual, Neat and Well Groomed  Eye Contact:  Good  Speech:  Clear and Coherent  Volume:  Normal  Mood:  Euthymic  Affect:  Appropriate and Congruent  Thought Process:  Goal Directed  Orientation:  Full (Time, Place, and Person)  Thought Content: WDL   Suicidal Thoughts:  No  Homicidal Thoughts:  No  Memory:  Immediate;   Good Recent;   Good Remote;   Good  Judgement:  Good  Insight:  Fair  Psychomotor Activity:  Normal  Concentration:  Concentration: Good and Attention Span: Good  Recall:  Good  Fund of Knowledge: Good  Language: Good  Akathisia:  No  Handed:  Right  AIMS (if indicated): not done  Assets:  Communication Skills Desire for Improvement Physical Health Resilience Social Support Talents/Skills  ADL's:  Intact  Cognition: WNL  Sleep:  Good   Screenings: GAD-7     Counselor from 09/27/2018 [facility]  Total GAD-7 Score  18    PHQ2-9     Counselor from 09/27/2018 in  [facility] Office Visit from 10/16/2017 in [facility] Counselor from 10/13/2017 in [facility] Nutrition from 07/30/2014 in [facility]  PHQ-2 Total Score  6  6  6   (Pended)   0  PHQ-9 Total Score  21  16  21   (Pended)   -       Assessment and Plan: This patient is a 32 year old female with a history of depression and anxiety.  She seems to be doing quite a bit better.  She will continue trazodone 50 mg at bedtime for sleep, Prozac 60 mg daily for depression, Abilify 2 mg daily for augmentation, Adderall 30 mg every morning for focus and Xanax 1 mg 2 times daily as needed for anxiety.  She will return to see me in 2 months or call sooner if needed   [name], MD 10/15/2018, 11:41 AM

## 2018-10-24 ENCOUNTER — Ambulatory Visit: Payer: Self-pay | Admitting: Gastroenterology

## 2018-10-25 ENCOUNTER — Ambulatory Visit (HOSPITAL_COMMUNITY): Payer: PRIVATE HEALTH INSURANCE | Admitting: Psychiatry

## 2018-10-27 ENCOUNTER — Other Ambulatory Visit: Payer: Self-pay | Admitting: Gastroenterology

## 2018-11-08 ENCOUNTER — Telehealth (HOSPITAL_COMMUNITY): Payer: Self-pay | Admitting: *Deleted

## 2018-11-08 ENCOUNTER — Other Ambulatory Visit (HOSPITAL_COMMUNITY): Payer: Self-pay | Admitting: Psychiatry

## 2018-11-08 MED ORDER — AMPHETAMINE-DEXTROAMPHETAMINE 30 MG PO TABS: 30.0000 mg | ORAL_TABLET | ORAL | 0 refills | Status: DC

## 2018-11-08 NOTE — Telephone Encounter (Signed)
Dr Tenny Crawoss Patient called requesting refill on Adderall

## 2018-11-08 NOTE — Telephone Encounter (Signed)
sent 

## 2018-11-21 ENCOUNTER — Ambulatory Visit (HOSPITAL_COMMUNITY): Payer: PRIVATE HEALTH INSURANCE | Admitting: Psychiatry

## 2018-11-21 DIAGNOSIS — F900 Attention-deficit hyperactivity disorder, predominantly inattentive type: Secondary | ICD-10-CM | POA: Insufficient documentation

## 2018-11-21 DIAGNOSIS — F419 Anxiety disorder, unspecified: Secondary | ICD-10-CM | POA: Insufficient documentation

## 2018-11-21 DIAGNOSIS — K589 Irritable bowel syndrome without diarrhea: Secondary | ICD-10-CM | POA: Insufficient documentation

## 2018-11-22 ENCOUNTER — Telehealth (HOSPITAL_COMMUNITY): Payer: Self-pay | Admitting: Psychiatry

## 2018-11-22 NOTE — Telephone Encounter (Signed)
Therapist called patient's cell and work numbers in response to fax from patient cancelling appointment and requesting reading material for coping with holidays. Therapist left message indicating willingness to fax reading material. In order to insure patient's confidentiality, therapist also asked patient to return call to verify fax number as number provided appears to be at patient's place of employment.

## 2018-11-26 ENCOUNTER — Other Ambulatory Visit (HOSPITAL_COMMUNITY): Payer: Self-pay | Admitting: Psychiatry

## 2018-12-03 ENCOUNTER — Ambulatory Visit (HOSPITAL_COMMUNITY): Payer: BLUE CROSS/BLUE SHIELD | Admitting: Psychiatry

## 2018-12-11 ENCOUNTER — Telehealth: Payer: Self-pay | Admitting: Gastroenterology

## 2018-12-11 ENCOUNTER — Ambulatory Visit (HOSPITAL_COMMUNITY): Payer: PRIVATE HEALTH INSURANCE | Admitting: Psychiatry

## 2018-12-11 NOTE — Telephone Encounter (Signed)
Pt called to say that she has a new insurance with the [place] of [address] [facility]) and it is not covering her LInzess prescription and wanted to know if there was something else cheaper or a generic that could be called in. Please advise [phone]

## 2018-12-11 NOTE — Telephone Encounter (Signed)
PT last seen Levin Erp, 09/19/2016. Has OV with Tana Coast, PA on 01/21/2019 at 9:30 AM. Minerva Areola, please advise!

## 2018-12-12 ENCOUNTER — Encounter (HOSPITAL_COMMUNITY): Payer: Self-pay | Admitting: Psychiatry

## 2018-12-12 ENCOUNTER — Ambulatory Visit (INDEPENDENT_AMBULATORY_CARE_PROVIDER_SITE_OTHER): Payer: PRIVATE HEALTH INSURANCE | Admitting: Psychiatry

## 2018-12-12 VITALS — BP 110/77 | HR 86 | Ht 62.0 in | Wt 187.8 lb

## 2018-12-12 DIAGNOSIS — F332 Major depressive disorder, recurrent severe without psychotic features: Secondary | ICD-10-CM | POA: Diagnosis not present

## 2018-12-12 MED ORDER — AMPHETAMINE-DEXTROAMPHETAMINE 30 MG PO TABS: 30.0000 mg | ORAL_TABLET | ORAL | 0 refills | Status: DC

## 2018-12-12 MED ORDER — FLUOXETINE HCL 20 MG PO CAPS: 20.0000 mg | ORAL_CAPSULE | Freq: Every day | ORAL | 2 refills | Status: DC

## 2018-12-12 MED ORDER — FLUOXETINE HCL 40 MG PO CAPS: ORAL_CAPSULE | ORAL | 2 refills | Status: DC

## 2018-12-12 MED ORDER — ALPRAZOLAM 1 MG PO TABS: 1.0000 mg | ORAL_TABLET | Freq: Two times a day (BID) | ORAL | 2 refills | Status: DC | PRN

## 2018-12-12 NOTE — Progress Notes (Signed)
BH MD/PA/NP OP Progress Note  12/12/2018 1:54 PM Sandra Glover  MRN:  [id number]  Chief Complaint:  Chief Complaint    Depression; Anxiety; Follow-up; ADHD     HPI: This patient is a 33 year old married white female who lives with her husband and 2 children in [place].  She works for the [employer] in the [employer].  The patient returns after 2 months.  She is generally doing well.  Her mood has been stable and she is no longer going through severe depression.  Her mother is still undergoing treatment for thyroid cancer.  She has had one surgery and then a second 1 in our left undergo radiation.  The patient seems to be handling this fine.  She is generally sleeping okay and able to function well at her job.  She denies suicidal ideation.  She states that she had to stop Abilify because her new insurance will not cover it but so far she is doing okay without it.  She thinks she is benefiting from the increased dosage of Prozac Visit Diagnosis:    ICD-10-CM   1. Severe episode of recurrent major depressive disorder, without psychotic features (HCC) F33.2     Past Psychiatric History: Long-term outpatient treatment in our office  Past Medical History:  Past Medical History:  Diagnosis Date  . Anxiety   . Anxiety   . Depression   . Gestational diabetes mellitus, antepartum    gestational only  . History of anemia 2013  . History of depression    states stopped med. in 2013  . 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 Pertussis   . Postcoital bleeding 07/14/2015  . Sinus infection 01/25/2013   started antibiotic 01/24/2013 x 10 days; nasal congestion, clear drainage from nose  . Tonsillar and adenoid hypertrophy 01/2013  . Vaginal irritation 03/25/2014   Mild yeast but has history of yeast, will not treat til after first trimester,try yogurt    Past Surgical History:  Procedure Laterality Date  . CHOLECYSTECTOMY   04/23/2010   laparoscopic  . COLONOSCOPY WITH PROPOFOL N/A 05/17/2016   Procedure: COLONOSCOPY WITH PROPOFOL;  Surgeon: West Bali, MD;  [facility];  Service: Endoscopy;  Laterality: N/A;  1000  . HEMORRHOID BANDING N/A 05/17/2016   Procedure: HEMORRHOID BANDING;  Surgeon: West Bali, MD;  [facility]: [facility];  Service: Endoscopy;  Laterality: N/A;  . HEMORRHOID SURGERY N/A 06/08/2016   Procedure: SIMPLE HEMORRHOIDECTOMY;  Surgeon: Ancil Linsey, MD;  Location: [facility];  Service: General;  Laterality: N/A;  . PARTIAL NEPHRECTOMY Right age 40 mos.  . TONSILLECTOMY AND ADENOIDECTOMY N/A 01/29/2013   Procedure: TONSILLECTOMY AND ADENOIDECTOMY;  Surgeon: Darletta Moll, MD;  Location: [facility];  Service: ENT;  Laterality: N/A;    Family Psychiatric History: See below  Family History:  Family History  Problem Relation Age of Onset  . COPD Mother   . Depression Mother   . Anxiety disorder Mother   . Hypertension Mother   . Irritable bowel syndrome Mother   . Arthritis Mother   . Skin cancer Mother   . Ovarian cancer Maternal Aunt   . Ovarian cancer Maternal Grandmother   . Hypertension Father   . GER disease Father   . Bipolar disorder Paternal Uncle   . Anxiety disorder Cousin   . Drug abuse Cousin   . Bipolar disorder Cousin   .  Colon cancer Neg Hx     Social History:  Social History   Socioeconomic History  . Marital status: Married    Spouse name: Not on file  . Number of children: Not on file  . Years of education: Not on file  . Highest education level: Not on file  Occupational History  . Not on file  Social Needs  . Financial resource strain: Not on file  . Food insecurity:    Worry: Not on file    Inability: Not on file  . Transportation needs:    Medical: Not on file    Non-medical: Not on file  Tobacco Use  . Smoking status: Never Smoker  . Smokeless tobacco: Never Used  . Tobacco comment: Never smoked  Substance and  Sexual Activity  . Alcohol use: Yes    Alcohol/week: 0.0 standard drinks    Comment: socially  . Drug use: No  . Sexual activity: Yes    Birth control/protection: Implant    Comment: nexplanon implant  Lifestyle  . Physical activity:    Days per week: Not on file    Minutes per session: Not on file  . Stress: Not on file  Relationships  . Social connections:    Talks on phone: Not on file    Gets together: Not on file    Attends religious service: Not on file    Active member of club or organization: Not on file    Attends meetings of clubs or organizations: Not on file    Relationship status: Not on file  Other Topics Concern  . Not on file  Social History Narrative  . Not on file    Allergies:  Allergies  Allergen Reactions  . Lortab [Hydrocodone-Acetaminophen] Nausea And Vomiting  . Morphine And Related Rash    Metabolic Disorder Labs: No results found for: HGBA1C, MPG No results found for: PROLACTIN No results found for: CHOL, TRIG, HDL, CHOLHDL, VLDL, LDLCALC Lab Results  Component Value Date   TSH 2.553 10/13/2017    Therapeutic Level Labs: No results found for: LITHIUM No results found for: VALPROATE No components found for:  CBMZ  Current Medications: Current Outpatient Medications  Medication Sig Dispense Refill  . ALPRAZolam (XANAX) 1 MG tablet Take 1 tablet (1 mg total) by mouth 2 (two) times daily as needed for anxiety. 60 tablet 2  . amphetamine-dextroamphetamine (ADDERALL) 30 MG tablet Take 1 tablet by mouth every morning. 30 tablet 0  . amphetamine-dextroamphetamine (ADDERALL) 30 MG tablet Take 1 tablet by mouth every morning. 30 tablet 0  . etonogestrel (NEXPLANON) 68 MG IMPL implant 1 each by Subdermal route once.    Marland Kitchen FLUoxetine (PROZAC) 20 MG capsule Take 1 capsule (20 mg total) by mouth daily. 30 capsule 2  . FLUoxetine (PROZAC) 40 MG capsule TAKE 1 CAPSULE(40 MG) BY MOUTH DAILY 30 capsule 2  . LINZESS 145 MCG CAPS capsule TAKE 1 CAPSULE BY  MOUTH BEFORE FIRST MEAL OF THE DAY 90 capsule 1  . Lorcaserin HCl (BELVIQ) 10 MG TABS Take by mouth.    . traZODone (DESYREL) 50 MG tablet TAKE 1 TABLET(50 MG) BY MOUTH AT BEDTIME 30 tablet 2  . amphetamine-dextroamphetamine (ADDERALL) 30 MG tablet Take 1 tablet by mouth every morning. 30 tablet 0   No current facility-administered medications for this visit.      Musculoskeletal: Strength & Muscle Tone: within normal limits Gait & Station: normal Patient leans: N/A  Psychiatric Specialty Exam: Review of Systems  All  other systems reviewed and are negative.   Blood pressure 110/77, pulse 86, height 5\' 2"  (1.575 m), weight 187 lb 12.8 oz (85.2 kg), SpO2 96 %.Body mass index is 34.35 kg/m.  General Appearance: Casual, Neat and Well Groomed  Eye Contact:  Good  Speech:  Clear and Coherent  Volume:  Normal  Mood:  Euthymic  Affect:  Congruent  Thought Process:  Goal Directed  Orientation:  Full (Time, Place, and Person)  Thought Content: WDL   Suicidal Thoughts:  No  Homicidal Thoughts:  No  Memory:  Immediate;   Good Recent;   Good Remote;   Good  Judgement:  Good  Insight:  Good  Psychomotor Activity:  Normal  Concentration:  Concentration: Good and Attention Span: Good  Recall:  Good  Fund of Knowledge: Good  Language: Good  Akathisia:  No  Handed:  Right  AIMS (if indicated): not done  Assets:  Communication Skills Desire for Improvement Physical Health Resilience Social Support Talents/Skills  ADL's:  Intact  Cognition: WNL  Sleep:  Good   Screenings: GAD-7     Counselor from [facility]27/2018 [facility]  Total GAD-7 Score  18    PHQ2-9     Counselor from 09/27/2018 in [facility] [facility] Counselor from 10[facility]13/2017 in [facility] Nutrition from 07/30/2014 in Nutrition [facility] Education  Services  PHQ-2 Total Score  6  6  6   (Pended)   0  PHQ-9 Total Score  21  16  21   (Pended)   -       Assessment and Plan: Patient is a 33 year old female with a history of depression anxiety and ADHD.  She is doing well on her current regimen.  Plan she will continue Prozac 60 mg daily for depression, trazodone 50 mg at bedtime for sleep and Adderall 30 mg daily for focus.  She will return to see me in 3 months   Diannia Rudereborah Ross, MD 12/12/2018, 1:54 PM

## 2018-12-13 NOTE — Telephone Encounter (Signed)
We may have to just trial her on other options. Can we have her pick up samples of Amitiza daily with food (aka on a full stomach) x 1 week. Call with a progress report in 1 week.  It may also help if we can find out (at least before her Feb OV) what constipation medications her new insurance covers.

## 2018-12-14 NOTE — Telephone Encounter (Signed)
LMOM to call. Samples of Amitiza 24 mcg # 12 at front for pick up.

## 2018-12-17 ENCOUNTER — Telehealth (HOSPITAL_COMMUNITY): Payer: Self-pay | Admitting: *Deleted

## 2018-12-17 NOTE — Telephone Encounter (Signed)
Rx told  0 refills left.  12/12/2018 provider e-scribed x 3 scripts. Confirmed w/ Rx

## 2018-12-17 NOTE — Telephone Encounter (Signed)
LMOM to call.

## 2018-12-18 NOTE — Telephone Encounter (Signed)
PT is aware and will pick up the samples and then call with progress report.

## 2018-12-19 ENCOUNTER — Telehealth: Payer: Self-pay

## 2018-12-19 NOTE — Telephone Encounter (Signed)
Opened in error

## 2018-12-23 ENCOUNTER — Other Ambulatory Visit: Payer: Self-pay

## 2018-12-23 ENCOUNTER — Encounter (HOSPITAL_COMMUNITY): Payer: Self-pay | Admitting: Emergency Medicine

## 2018-12-23 ENCOUNTER — Emergency Department (HOSPITAL_COMMUNITY)
Admission: EM | Admit: 2018-12-23 | Discharge: 2018-12-23 | Disposition: A | Discharge status: Home / Self Care | Payer: PRIVATE HEALTH INSURANCE | Attending: Emergency Medicine | Admitting: Emergency Medicine

## 2018-12-23 DIAGNOSIS — R197 Diarrhea, unspecified: Secondary | ICD-10-CM

## 2018-12-23 DIAGNOSIS — Z79899 Other long term (current) drug therapy: Secondary | ICD-10-CM | POA: Insufficient documentation

## 2018-12-23 DIAGNOSIS — R1030 Lower abdominal pain, unspecified: Secondary | ICD-10-CM | POA: Diagnosis not present

## 2018-12-23 LAB — CBC WITH DIFFERENTIAL/PLATELET
Abs Immature Granulocytes: 0.05 10*3/uL (ref 0.00–0.07)
Basophils Absolute: 0 10*3/uL (ref 0.0–0.1)
Basophils Relative: 0 %
Eosinophils Absolute: 0.1 10*3/uL (ref 0.0–0.5)
Eosinophils Relative: 1 %
HCT: 40.8 % (ref 36.0–46.0)
Hemoglobin: 13.2 g/dL (ref 12.0–15.0)
Immature Granulocytes: 0 %
Lymphocytes Relative: 21 %
Lymphs Abs: 2.4 10*3/uL (ref 0.7–4.0)
MCH: 30.1 pg (ref 26.0–34.0)
MCHC: 32.4 g/dL (ref 30.0–36.0)
MCV: 92.9 fL (ref 80.0–100.0)
Monocytes Absolute: 0.6 10*3/uL (ref 0.1–1.0)
Monocytes Relative: 5 %
Neutro Abs: 8.2 10*3/uL — ABNORMAL HIGH (ref 1.7–7.7)
Neutrophils Relative %: 73 %
Platelets: 279 10*3/uL (ref 150–400)
RBC: 4.39 MIL/uL (ref 3.87–5.11)
RDW: 12.6 % (ref 11.5–15.5)
WBC: 11.4 10*3/uL — ABNORMAL HIGH (ref 4.0–10.5)
nRBC: 0 % (ref 0.0–0.2)

## 2018-12-23 LAB — COMPREHENSIVE METABOLIC PANEL
ALT: 14 U/L (ref 0–44)
AST: 34 U/L (ref 15–41)
Albumin: 4.2 g/dL (ref 3.5–5.0)
Alkaline Phosphatase: 130 U/L — ABNORMAL HIGH (ref 38–126)
Anion gap: 12 (ref 5–15)
BUN: 8 mg/dL (ref 6–20)
CO2: 20 mmol/L — ABNORMAL LOW (ref 22–32)
Calcium: 9 mg/dL (ref 8.9–10.3)
Chloride: 105 mmol/L (ref 98–111)
Creatinine, Ser: 0.76 mg/dL (ref 0.44–1.00)
GFR calc Af Amer: >60 mL/min (ref >60)
GFR calc non Af Amer: >60 mL/min (ref >60)
Glucose, Bld: 85 mg/dL (ref 70–99)
Potassium: 4.5 mmol/L (ref 3.5–5.1)
Sodium: 137 mmol/L (ref 135–145)
Total Bilirubin: 1.2 mg/dL (ref 0.3–1.2)
Total Protein: 7.1 g/dL (ref 6.5–8.1)

## 2018-12-23 LAB — POC OCCULT BLOOD, ED: Fecal Occult Bld: NEGATIVE

## 2018-12-23 LAB — PREGNANCY, URINE: Preg Test, Ur: NEGATIVE

## 2018-12-23 MED ORDER — ONDANSETRON HCL 4 MG/2ML IJ SOLN
4.0000 mg | Freq: Once | INTRAMUSCULAR | Status: AC
  Administered 2018-12-23: 4 mg via INTRAVENOUS
  Filled 2018-12-23: qty 2

## 2018-12-23 MED ORDER — SODIUM CHLORIDE 0.9 % IV BOLUS
1000.0000 mL | Freq: Once | INTRAVENOUS | Status: AC
  Administered 2018-12-23: 1000 mL via INTRAVENOUS

## 2018-12-23 NOTE — ED Provider Notes (Signed)
[facility] EMERGENCY DEPARTMENT Provider Note   CSN: [id number] Arrival date & time: 12/23/18  1810     History   Chief Complaint Chief Complaint  Patient presents with  . Diarrhea    HPI Sandra Glover is a 33 y.o. female.  HPI   Sandra Glover is a 33 y.o. female, with a history of anxiety, depression, right nephrectomy and IBS, presenting to the ED with bloody diarrhea beginning today.  Patient states she has had at least 3 watery stools with bright red blood since symptoms began around noon today.  She has abdominal pain in the lower abdomen during these bowel movements that is sharp, radiates across the lower abdomen, moderate in intensity.  Changes to a dull pain and then resolves.  Accompanied by feeling of abdominal bloating.  She has not had this issue before. She has had some drips of bright red blood in between bowel movements.  This evening she began to feel lightheaded.  Typically has one soft bowel movement daily.  Denies any recent illness, travel, eating out at restaurants, or sick contacts with similar symptoms.  Denies known fever, nausea, vomiting, chest pain, shortness of breath, syncope, vaginal bleeding, urinary symptoms, or any other complaints.    Past Medical History:  Diagnosis Date  . Anxiety   . Anxiety   . Depression   . Gestational diabetes mellitus, antepartum    gestational only  . History of anemia 2013  . History of depression    states stopped med. in 2013  . History of partial nephrectomy age 61 mos.   right - due to kidney infection  . Pertussis   . Postcoital bleeding 07/14/2015  . Sinus infection 01/25/2013   started antibiotic 01/24/2013 x 10 days; nasal congestion, clear drainage from nose  . Tonsillar and adenoid hypertrophy 01/2013  . Vaginal irritation 03/25/2014   Mild yeast but has history of yeast, will not treat til after first trimester,try yogurt    Patient Active Problem List   Diagnosis Date Noted  . Nexplanon  insertion 09/25/2017  . Hemorrhoids 09/19/2016  . Rectal bleeding 03/24/2016  . Constipation 03/24/2016  . Postcoital bleeding 07/14/2015  . History of preterm delivery 09/09/2014  . Postpartum UTI 09/09/2014  . Pertussis 07/30/2014  . History of gestational diabetes 07/24/2014  . 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 Depression with anxiety 03/03/2014    Past Surgical History:  Procedure Laterality Date  . CHOLECYSTECTOMY  04/23/2010   laparoscopic  . COLONOSCOPY WITH PROPOFOL N/A 05/17/2016   Procedure: COLONOSCOPY WITH PROPOFOL;  Surgeon: West Bali, MD;  [facility];  Service: Endoscopy;  Laterality: N/A;  1000  . HEMORRHOID BANDING N/A 05/17/2016   Procedure: HEMORRHOID BANDING;  Surgeon: West Bali, MD;  [facility]: [facility];  Service: Endoscopy;  Laterality: N/A;  . HEMORRHOID SURGERY N/A 06/08/2016   Procedure: SIMPLE HEMORRHOIDECTOMY;  Surgeon: Ancil Linsey, MD;  Location: [facility];  Service: General;  Laterality: N/A;  . PARTIAL NEPHRECTOMY Right age 56 mos.  . TONSILLECTOMY AND ADENOIDECTOMY N/A 01/29/2013   Procedure: TONSILLECTOMY AND ADENOIDECTOMY;  Surgeon: Darletta Moll, MD;  Location: [facility];  Service: ENT;  Laterality: N/A;     OB History    Gravida  2   Para  2   Term  1   Preterm  1   AB      Living  2     SAB  TAB      Ectopic      Multiple      Live Births  2            Home Medications    Prior to Admission medications   Medication Sig Start Date End Date Taking? Authorizing Provider  ALPRAZolam Prudy Feeler(XANAX) 1 MG tablet Take 1 tablet (1 mg total) by mouth 2 (two) times daily as needed for anxiety. 12/12/18 12/12/19  Myrlene Brokeross, Deborah R, MD  amphetamine-dextroamphetamine (ADDERALL) 30 MG tablet Take 1 tablet by mouth every morning. 12/12/18 12/12/19  Myrlene Brokeross, Deborah R, MD  amphetamine-dextroamphetamine (ADDERALL) 30 MG tablet Take 1 tablet by mouth every morning. 12/12/18 12/12/19  Myrlene Brokeross, Deborah R, MD    amphetamine-dextroamphetamine (ADDERALL) 30 MG tablet Take 1 tablet by mouth every morning. 12/12/18 12/12/19  Myrlene Brokeross, Deborah R, MD  etonogestrel (NEXPLANON) 68 MG IMPL implant 1 each by Subdermal route once.    [provider]  FLUoxetine (PROZAC) 20 MG capsule Take 1 capsule (20 mg total) by mouth daily. 12/12/18 12/12/19  Myrlene Brokeross, Deborah R, MD  FLUoxetine (PROZAC) 40 MG capsule TAKE 1 CAPSULE(40 MG) BY MOUTH DAILY 12/12/18   Myrlene Brokeross, Deborah R, MD  LINZESS 145 MCG CAPS capsule TAKE 1 CAPSULE BY MOUTH BEFORE FIRST MEAL OF THE DAY 10/31/18   Anice PaganiniGill, Eric A, NP  Lorcaserin HCl (BELVIQ) 10 MG TABS Take by mouth.    [provider]  traZODone (DESYREL) 50 MG tablet TAKE 1 TABLET(50 MG) BY MOUTH AT BEDTIME 09/28/18   Myrlene Brokeross, Deborah R, MD    Family History Family History  Problem Relation Age of Onset  . COPD Mother   . Depression Mother   . Anxiety disorder Mother   . Hypertension Mother   . Irritable bowel syndrome Mother   . Arthritis Mother   . Skin cancer Mother   . Ovarian cancer Maternal Aunt   . Ovarian cancer Maternal Grandmother   . Hypertension Father   . GER disease Father   . Bipolar disorder Paternal Uncle   . Anxiety disorder Cousin   . Drug abuse Cousin   . Bipolar disorder Cousin   . Colon cancer Neg Hx     Social History Social History   Tobacco Use  . Smoking status: Never Smoker  . Smokeless tobacco: Never Used  . Tobacco comment: Never smoked  Substance Use Topics  . Alcohol use: Yes    Alcohol/week: 0.0 standard drinks    Comment: socially  . Drug use: No     Allergies   Lortab [hydrocodone-acetaminophen] and Morphine and related   Review of Systems Review of Systems  Constitutional: Negative for chills and fever.  Respiratory: Negative for shortness of breath.   Cardiovascular: Negative for chest pain.  Gastrointestinal: Positive for abdominal pain, blood in stool and diarrhea. Negative for nausea and vomiting.  Genitourinary: Negative for  dysuria, hematuria, vaginal bleeding and vaginal discharge.  Musculoskeletal: Negative for back pain.  Neurological: Positive for light-headedness.  All other systems reviewed and are negative.    Physical Exam Updated Vital Signs BP (!) 145/94 (BP Location: Right Arm)   Pulse 71   Temp 98 F (36.7 C) (Oral)   Resp 17   Ht 5\' 3"  (1.6 m)   Wt 81.6 kg   LMP 12/05/2018 (Approximate)   SpO2 100%   BMI 31.89 kg/m   Physical Exam Vitals signs and nursing note reviewed. Exam conducted with a chaperone present.  Constitutional:      General:  She is not in acute distress.    Appearance: She is well-developed. She is not diaphoretic.  HENT:     Head: Normocephalic and atraumatic.     Mouth/Throat:     Mouth: Mucous membranes are moist.     Pharynx: Oropharynx is clear.  Eyes:     Conjunctiva/sclera: Conjunctivae normal.  Neck:     Musculoskeletal: Neck supple.  Cardiovascular:     Rate and Rhythm: Normal rate and regular rhythm.     Pulses: Normal pulses.     Heart sounds: Normal heart sounds.  Pulmonary:     Effort: Pulmonary effort is normal. No respiratory distress.     Breath sounds: Normal breath sounds.  Abdominal:     General: Bowel sounds are increased.     Palpations: Abdomen is soft.     Tenderness: There is no abdominal tenderness. There is no guarding.  Genitourinary:    Rectum: Guaiac result negative.     Comments: No external hemorrhoids, fissures, or lesions noted. No frank blood or melena. No stool burden. No rectal tenderness. No foreign bodies noted.  RN, Ginger, served as chaperone during the rectal exam. Musculoskeletal:     Right lower leg: No edema.     Left lower leg: No edema.  Lymphadenopathy:     Cervical: No cervical adenopathy.  Skin:    General: Skin is warm and dry.  Neurological:     Mental Status: She is alert.  Psychiatric:        Mood and Affect: Mood and affect normal.        Speech: Speech normal.        Behavior: Behavior normal.       ED Treatments / Results  Labs (all labs ordered are listed, but only abnormal results are displayed) Labs Reviewed  CBC WITH DIFFERENTIAL/PLATELET - Abnormal; Notable for the following components:      Result Value   WBC 11.4 (*)    Neutro Abs 8.2 (*)    All other components within normal limits  COMPREHENSIVE METABOLIC PANEL - Abnormal; Notable for the following components:   CO2 20 (*)    Alkaline Phosphatase 130 (*)    All other components within normal limits  GASTROINTESTINAL PANEL BY PCR, STOOL (REPLACES STOOL CULTURE)  PREGNANCY, URINE  POC OCCULT BLOOD, ED    EKG None  Radiology No results found.  Procedures Procedures (including critical care time)  Medications Ordered in ED Medications  sodium chloride 0.9 % bolus 1,000 mL (0 mLs Intravenous Stopped 12/23/18 2157)  ondansetron (ZOFRAN) injection 4 mg (4 mg Intravenous Given 12/23/18 2003)     Initial Impression / Assessment and Plan / ED Course  I have reviewed the triage vital signs and the nursing notes.  Pertinent labs & imaging results that were available during my care of the patient were reviewed by me and considered in my medical decision making (see chart for details).     Patient presents with complaint of bloody diarrhea beginning today. Patient is nontoxic appearing, afebrile, not tachycardic, not tachypneic, not hypotensive, maintains excellent SPO2 on room air, and is in no apparent distress.  Mild leukocytosis of 11.4 is nonspecific. Serial abdominal exams benign.  Patient cathetered a very small amount of stool the same time she collected her urine sample.  This ended up not being enough to test at the lab.  No bowel movements throughout the rest of the ED course. She has the ability for close GI follow-up. Strict return  precautions discussed.  Patient voices understanding of these instructions, accepts the plan, and is comfortable with discharge.   Vitals:   12/23/18 2000 12/23/18 2030  12/23/18 2104 12/23/18 2130  BP: 125/78 121/82 114/72 117/80  Pulse: 60 62 61 72  Resp:   18 17  Temp:   98.8 F (37.1 C)   TempSrc:   Oral   SpO2:   100% 100%  Weight:      Height:         Final Clinical Impressions(s) / ED Diagnoses   Final diagnoses:  Diarrhea, unspecified type    ED Discharge Orders    None       Concepcion LivingJoy, Shawn C, PA-C 12/23/18 2306    Linwood DibblesKnapp, Jon, MD 12/24/18 [id number]

## 2018-12-23 NOTE — ED Triage Notes (Signed)
Patient complains of abdominal x 1 day with diarrhea. Three bowel movements that have contained blood. Patient has a history of hemorrhoids with surgery performed in 2016 or 2017.

## 2018-12-23 NOTE — Discharge Instructions (Signed)
Please follow-up with your gastroenterologist in the coming week. Return to the ED for constant/worsening abdominal pain, worsening/uncontrolled diarrhea, fever with your symptoms, chest pain, shortness of breath, or any other major concerns.

## 2018-12-23 NOTE — ED Notes (Signed)
Pt reports hat her son has recently been sick She reports he was sick and she took him the the [facility] ED in [place] Reports he had a constipation and a virus

## 2018-12-23 NOTE — ED Notes (Signed)
Pt ambulatory to waiting room. Pt verbalized understanding of discharge instructions.   

## 2018-12-26 ENCOUNTER — Encounter: Payer: Self-pay | Admitting: Nurse Practitioner

## 2018-12-26 ENCOUNTER — Ambulatory Visit (INDEPENDENT_AMBULATORY_CARE_PROVIDER_SITE_OTHER): Payer: PRIVATE HEALTH INSURANCE | Admitting: Nurse Practitioner

## 2018-12-26 VITALS — BP 128/81 | HR 82 | Temp 97.3°F | Ht 63.0 in | Wt 189.4 lb

## 2018-12-26 DIAGNOSIS — R1030 Lower abdominal pain, unspecified: Secondary | ICD-10-CM

## 2018-12-26 DIAGNOSIS — K625 Hemorrhage of anus and rectum: Secondary | ICD-10-CM

## 2018-12-26 DIAGNOSIS — R109 Unspecified abdominal pain: Secondary | ICD-10-CM | POA: Insufficient documentation

## 2018-12-26 DIAGNOSIS — R197 Diarrhea, unspecified: Secondary | ICD-10-CM | POA: Diagnosis not present

## 2018-12-26 NOTE — Progress Notes (Signed)
cc'ed to pcp °

## 2018-12-26 NOTE — Assessment & Plan Note (Signed)
The patient did have rectal bleeding on Sunday and Monday in the setting of frequent diarrhea.  Colonoscopy up-to-date 2017.  The bleeding has since stopped as her stools have slowed down.  Likely anorectal irritation or other benign anorectal source.  She did have hemorrhoids resected previously.  No hemorrhoids on rectal exam in the ER.  Noted to be heme-negative in the ER as well.  Further management of her diarrhea and abdominal pain as per above.  Notify us of any worsening bleeding and follow-up in 2 months.

## 2018-12-26 NOTE — Patient Instructions (Signed)
Your health issues we discussed today were:   Abdominal pain and diarrhea: 1. As we discussed, I think you likely have a "stomach bug" 2. If you have further diarrhea will check a stool test to make sure there is no other bacteria that need to be treated 3. I am also checking some lab test that you can have drawn at the lab 4. Start taking probiotics, available over-the-counter, for 1 to 2 months 5. Call us if you have any worsening symptoms  Bloody stools: 1. I think the blood in your stools was from frequent diarrhea 2. Let us know if you have any further bleeding and we can consider further work-up  Overall I recommend:  1. Follow-up in 2 months 2. Call us if you have any questions or concerns  At [facility] we value your feedback. You may receive a survey about your visit today. Please share your experience as we strive to create trusting relationships with our patients to provide genuine, compassionate, quality care.  We appreciate your understanding and patience as we review any laboratory studies, imaging, and other diagnostic tests that are ordered as we care for you. Our office policy is 5 business days for review of these results, and any emergent or urgent results are addressed in a timely manner for your best interest. If you do not hear from our office in 1 week, please contact us.   We also encourage the use of MyChart, which contains your medical information for your review as well. If you are not enrolled in this feature, an access code is on this after visit summary for your convenience. Thank you for allowing [place] to be involved in your care.  It was great to see you today!  I hope you have a great week!!

## 2018-12-26 NOTE — Assessment & Plan Note (Signed)
Lower abdominal pain accompanied her frequent stools that started on Sunday, persisted through Monday, started resolving on Tuesday.  Her abdominal pain had resolved yesterday.  She does have some mild 3 out of 10 lower abdominal crampy pain/dull pain currently.  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  Further management as per above.  Follow-up in 2 months.  I feel when her diarrhea symptoms resolved her abdominal pain will subsequently improve.

## 2018-12-26 NOTE — Assessment & Plan Note (Signed)
Diarrhea started on Sunday.  Of note she ate takeout pizza and pasta on Saturday night, which is a known trigger for GI upset for her.  She had multiple stools that were loose on Sunday and the last 2 were bloody so she presented to the ER.  Heme-negative in the ER, no known hemorrhoids.  Her diarrhea persisted on Monday.  Yesterday, she had a single soft stool with no blood.  No bowel movement yet today.  This feels very much like a viral gastroenteritis.  I will check a GI pathogen panel to be sure.  I will start her on probiotics.  If GI path panel is negative or if no further severe diarrhea she can use Imodium as needed.  I will also check labs including [facility] for electrolytes and kidney function given persistent diarrhea, CBC for any drop in hemoglobin, CRP and ESR due to bloody diarrhea.  Follow-up in 2 months.

## 2018-12-26 NOTE — Progress Notes (Signed)
Referring Provider: Aniceto Boss, MD Primary Care Physician:  Aniceto Boss, MD Primary GI:  Dr. Darrick Penna  Chief Complaint  Patient presents with  . Rectal Bleeding    recent episode started on sunday. Last BM was " all liquids with blood"    HPI:   Sandra Glover is a 33 y.o. female who presents for rectal bleeding.  The patient has not been seen in our office since October 2017 when she was seen for constipation and hemorrhoids.  History of grade 3 hemorrhoids.  Colonoscopy up-to-date 05/17/2016 which found bleeding and pain due to nonbleeding external and internal hemorrhoids.  Not due to repeat colonoscopy until age 37.  Recommended surgical referral for hemorrhoid treatment.  Previously at her last visit her hemorrhoid symptoms resolved with apothecary cream and surgical resection.  Constipation improved on Linzess 145 mcg daily.  She was recently seen in emergency department 12/23/2018 complaining of bloody diarrhea.  Noted 3 watery stools and bright red blood since symptoms began with lower abdominal pain.  Some drops of bright red blood in between bowel movements.  Rectal exam found no external hemorrhoids, fissures, lesions as well as no frank blood or melena.  No rectal tenderness.  WBCs mildly elevated at 11.4 and other labs essentially normal.  No bowel movements in the emergency department.  Recommended GI follow-up.  Today she states she's doing ok. Symptoms started Sunday and had a very upset stomach which progressed to diarrhea. Having 5-6 stools a day. Diarrhea improved yesterday and had one bowel movement yesterday with some solid pieces in it; no bowel movement yesterday. Had bloody diarrhea last 2 bowel movements on Sunday prior to going to the ER. Monday had 1 episodes of diarrhea that had blood in it. No blood yesterday. Did have abdominal pain LLQ with her symptoms, persisted Monday and none yesterday. Having some abdominal pain about 3/10 and described as sharp  Sunday/Monday; today it's dull feeling, lingering. Denies fever, chills. Appetite is ok, but a bit scared to eat after Sunday and currently eating mostly crackers and bland foods. No N/V. She does admit she has been having "problems" with tomato sauce and other "italian-like foods." She had pizza and pasta Saturday night (ordered out), and symptoms started Sunday. Denies any recent antibiotics or healthcare exposure. Denies chest pain, dyspnea, dizziness, lightheadedness, syncope, near syncope. Denies any other upper or lower GI symptoms.  Past Medical History:  Diagnosis Date  . Anxiety   . Anxiety   . Depression   . Gestational diabetes mellitus, antepartum    gestational only  . History of anemia 2013  . History of depression    states stopped med. in 2013  . 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 Pertussis   . Postcoital bleeding 07/14/2015  . Sinus infection 01/25/2013   started antibiotic 01/24/2013 x 10 days; nasal congestion, clear drainage from nose  . Tonsillar and adenoid hypertrophy 01/2013  . Vaginal irritation 03/25/2014   Mild yeast but has history of yeast, will not treat til after first trimester,try yogurt    Past Surgical History:  Procedure Laterality Date  . CHOLECYSTECTOMY  04/23/2010   laparoscopic  . COLONOSCOPY WITH PROPOFOL N/A 05/17/2016   Procedure: COLONOSCOPY WITH PROPOFOL;  Surgeon: West Bali, MD;  [facility];  Service: Endoscopy;  Laterality: N/A;  1000  . HEMORRHOID BANDING N/A 05/17/2016   Procedure: HEMORRHOID BANDING;  Surgeon: Duncan Dull  Loreen FreudL Fields, MD;  Location[facility];  Service: Endoscopy;  Laterality: N/A;  . HEMORRHOID SURGERY N/A 06/08/2016   Procedure: SIMPLE HEMORRHOIDECTOMY;  Surgeon: Ancil LinseyJason Evan Davis, MD;  Location: [facility];  Service: General;  Laterality: N/A;  . PARTIAL NEPHRECTOMY Right age 33 mos.  . TONSILLECTOMY AND ADENOIDECTOMY N/A 01/29/2013   Procedure: TONSILLECTOMY AND  ADENOIDECTOMY;  Surgeon: Darletta MollSui W Teoh, MD;  Location: [facility];  Service: ENT;  Laterality: N/A;    Current Outpatient Medications  Medication Sig Dispense Refill  . ALPRAZolam (XANAX) 1 MG tablet Take 1 tablet (1 mg total) by mouth 2 (two) times daily as needed for anxiety. 60 tablet 2  . amphetamine-dextroamphetamine (ADDERALL) 30 MG tablet Take 1 tablet by mouth every morning. 30 tablet 0  . etonogestrel (NEXPLANON) 68 MG IMPL implant 1 each by Subdermal route once.    Marland Kitchen. FLUoxetine (PROZAC) 40 MG capsule TAKE 1 CAPSULE(40 MG) BY MOUTH DAILY 30 capsule 2  . LINZESS 145 MCG CAPS capsule TAKE 1 CAPSULE BY MOUTH BEFORE FIRST MEAL OF THE DAY 90 capsule 1  . Lorcaserin HCl (BELVIQ) 10 MG TABS Take by mouth daily.     . traZODone (DESYREL) 50 MG tablet TAKE 1 TABLET(50 MG) BY MOUTH AT BEDTIME 30 tablet 2   No current facility-administered medications for this visit.     Allergies as of 12/26/2018 - Review Complete 12/26/2018  Allergen Reaction Noted  . Lortab [hydrocodone-acetaminophen] Nausea And Vomiting 01/29/2013  . Morphine and related Rash 01/25/2013    Family History  Problem Relation Age of Onset  . COPD Mother   . Depression Mother   . Anxiety disorder Mother   . Hypertension Mother   . Irritable bowel syndrome Mother   . Arthritis Mother   . Skin cancer Mother   . Ovarian cancer Maternal Aunt   . Ovarian cancer Maternal Grandmother   . Hypertension Father   . GER disease Father   . Bipolar disorder Paternal Uncle   . Anxiety disorder Cousin   . Drug abuse Cousin   . Bipolar disorder Cousin   . Colon cancer Neg Hx     Social History   Socioeconomic History  . Marital status: Married    Spouse name: Not on file  . Number of children: Not on file  . Years of education: Not on file  . Highest education level: Not on file  Occupational History  . Not on file  Social Needs  . Financial resource strain: Not on file  . Food insecurity:    Worry: Not  on file    Inability: Not on file  . Transportation needs:    Medical: Not on file    Non-medical: Not on file  Tobacco Use  . Smoking status: Never Smoker  . Smokeless tobacco: Never Used  . Tobacco comment: Never smoked  Substance and Sexual Activity  . Alcohol use: Yes    Alcohol/week: 0.0 standard drinks    Comment: socially  . Drug use: No  . Sexual activity: Yes    Birth control/protection: Implant    Comment: nexplanon implant  Lifestyle  . Physical activity:    Days per week: Not on file    Minutes per session: Not on file  . Stress: Not on file  Relationships  . Social connections:    Talks on phone: Not on file    Gets together: Not on file    Attends religious service: Not on file  Active member of club or organization: Not on file    Attends meetings of clubs or organizations: Not on file    Relationship status: Not on file  Other Topics Concern  . Not on file  Social History Narrative  . Not on file    Review of Systems: Complete ROS negative except as per HPI.   Physical Exam: BP 128/81   Pulse 82   Temp (!) 97.3 F (36.3 C) (Oral)   Ht 5\' 3"  (1.6 m)   Wt 189 lb 6.4 oz (85.9 kg)   LMP 12/26/2018   BMI 33.55 kg/m  General:   Alert and oriented. Pleasant and cooperative. Well-nourished and well-developed.  Eyes:  Without icterus, sclera clear and conjunctiva pink.  Ears:  Normal auditory acuity. Cardiovascular:  S1, S2 present without murmurs appreciated. Extremities without clubbing or edema. Respiratory:  Clear to auscultation bilaterally. No wheezes, rales, or rhonchi. No distress.  Gastrointestinal:  +BS, soft, non-tender and non-distended. No HSM noted. No guarding or rebound. No masses appreciated.  Rectal:  Deferred  Musculoskalatal:  Symmetrical without gross deformities. Skin:  Intact without significant lesions or rashes. Neurologic:  Alert and oriented x4;  grossly normal neurologically. Psych:  Alert and cooperative. Normal mood  and affect. Heme/Lymph/Immune: No excessive bruising noted.    12/26/2018 9:16 AM   Disclaimer: This note was dictated with voice recognition software. Similar sounding words can inadvertently be transcribed and may not be corrected upon review.

## 2018-12-27 ENCOUNTER — Telehealth: Payer: Self-pay | Admitting: Gastroenterology

## 2018-12-27 LAB — COMPREHENSIVE METABOLIC PANEL
AG Ratio: 1.6 (calc) (ref 1.0–2.5)
ALT: 12 U/L (ref 6–29)
AST: 15 U/L (ref 10–30)
Albumin: 4.1 g/dL (ref 3.6–5.1)
Alkaline phosphatase (APISO): 143 U/L — ABNORMAL HIGH (ref 33–115)
BUN: 7 mg/dL (ref 7–25)
CO2: 28 mmol/L (ref 20–32)
Calcium: 9.2 mg/dL (ref 8.6–10.2)
Chloride: 105 mmol/L (ref 98–110)
Creat: 0.79 mg/dL (ref 0.50–1.10)
Globulin: 2.5 g/dL (calc) (ref 1.9–3.7)
Glucose, Bld: 86 mg/dL (ref 65–139)
Potassium: 4.4 mmol/L (ref 3.5–5.3)
Sodium: 139 mmol/L (ref 135–146)
Total Bilirubin: 0.3 mg/dL (ref 0.2–1.2)
Total Protein: 6.6 g/dL (ref 6.1–8.1)

## 2018-12-27 LAB — CBC WITH DIFFERENTIAL/PLATELET
Absolute Monocytes: 465 cells/uL (ref 200–950)
Basophils Absolute: 17 cells/uL (ref 0–200)
Basophils Relative: 0.2 %
Eosinophils Absolute: 83 cells/uL (ref 15–500)
Eosinophils Relative: 1 %
HCT: 38.8 % (ref 35.0–45.0)
Hemoglobin: 13.3 g/dL (ref 11.7–15.5)
Lymphs Abs: 1785 cells/uL (ref 850–3900)
MCH: 31 pg (ref 27.0–33.0)
MCHC: 34.3 g/dL (ref 32.0–36.0)
MCV: 90.4 fL (ref 80.0–100.0)
MPV: 10.6 fL (ref 7.5–12.5)
Monocytes Relative: 5.6 %
Neutro Abs: 5951 cells/uL (ref 1500–7800)
Neutrophils Relative %: 71.7 %
Platelets: 277 10*3/uL (ref 140–400)
RBC: 4.29 10*6/uL (ref 3.80–5.10)
RDW: 12.3 % (ref 11.0–15.0)
Total Lymphocyte: 21.5 %
WBC: 8.3 10*3/uL (ref 3.8–10.8)

## 2018-12-27 LAB — SEDIMENTATION RATE: Sed Rate: 25 mm/h — ABNORMAL HIGH (ref 0–20)

## 2018-12-27 LAB — C-REACTIVE PROTEIN: CRP: 8.2 mg/L — ABNORMAL HIGH (ref <8.0)

## 2018-12-27 NOTE — Telephone Encounter (Signed)
Pt faxed [place] a letter saying she was seen yesterday and needed a work note stating she was seen by [place]. She was asking if we could send via fax, email or put it in her MyChart.

## 2018-12-27 NOTE — Telephone Encounter (Signed)
I have faxed a note to pt and she is aware.

## 2018-12-29 ENCOUNTER — Other Ambulatory Visit (HOSPITAL_COMMUNITY)
Admission: RE | Admit: 2018-12-29 | Discharge: 2018-12-29 | Disposition: A | Discharge status: Home / Self Care | Payer: PRIVATE HEALTH INSURANCE | Source: Ambulatory Visit | Attending: Nurse Practitioner | Admitting: Nurse Practitioner

## 2018-12-29 DIAGNOSIS — R1032 Left lower quadrant pain: Secondary | ICD-10-CM | POA: Insufficient documentation

## 2018-12-29 DIAGNOSIS — R197 Diarrhea, unspecified: Secondary | ICD-10-CM | POA: Diagnosis present

## 2018-12-29 DIAGNOSIS — K625 Hemorrhage of anus and rectum: Secondary | ICD-10-CM | POA: Diagnosis not present

## 2018-12-30 LAB — GASTROINTESTINAL PANEL BY PCR, STOOL (REPLACES STOOL CULTURE)

## 2018-12-31 ENCOUNTER — Telehealth: Payer: Self-pay | Admitting: *Deleted

## 2018-12-31 DIAGNOSIS — R748 Abnormal levels of other serum enzymes: Secondary | ICD-10-CM

## 2018-12-31 NOTE — Telephone Encounter (Signed)
Patient faxed over a note stating the following:  "Good morning,  I was advised to let you know if I experienced bleeding again and wanted to let you know that I am. I have been experiencing sever headaches as well since Friday 01/24. Could you please give me a call at your earliest convenience and advise of what I can safely take for pain relief until you receive my lab and sample results? Thank you very much!"

## 2019-01-01 NOTE — Telephone Encounter (Signed)
Noted, let me know what she says.

## 2019-01-01 NOTE — Telephone Encounter (Signed)
CBC normal 12/26/2018. GI pathogen unrevealing. Await further lab results from [place]. May take Tylenol. Avoid any NSAIDs. Further recommendations when Minerva Areola returns.

## 2019-01-01 NOTE — Telephone Encounter (Signed)
PT is aware. Forwarding to Wynne DustEric Gill, NP to advise tomorrow.

## 2019-01-01 NOTE — Telephone Encounter (Addendum)
I spoke to pt and she said she has had blood in her stool about 4 times since Friday. Her stool is still more watery. She has had some severe headaches and when she went to ED her BP was elevated. I asked her has she called and PCP about her headaches and BP and she said no, she just told PCP that she went to the ED and was following up here with GI.  She wants to know what she can take for headaches that will not mess up her stomach. Also, she would like to hear from her lab results.  Minerva Areola has left for the day, sending to Lewie Loron, NP to advise!

## 2019-01-01 NOTE — Telephone Encounter (Signed)
LMOM to call.

## 2019-01-02 ENCOUNTER — Encounter: Payer: Self-pay | Admitting: Nurse Practitioner

## 2019-01-02 NOTE — Telephone Encounter (Signed)
Agree with below recommendations. Labs are normal. If Tylenol ineffective she should notify her PCP for other options.

## 2019-01-02 NOTE — Telephone Encounter (Signed)
Pt is aware.  She said she noticed that her alkaline phosphatase was elevated and more than it has been.  Minerva Areola, please advise!

## 2019-01-03 NOTE — Addendum Note (Signed)
Addended by: Delane Ginger, ERIC A on: 01/03/2019 01:36 PM   Modules accepted: Orders

## 2019-01-03 NOTE — Telephone Encounter (Signed)
PT is aware.

## 2019-01-03 NOTE — Telephone Encounter (Signed)
Actually, her alk phos (going back to 2011) has gone from 185 -> 158 -> 154 -> 130 and this last one was 142. Overall, it is stable, mildly elevated compared to the last value.  That being said I will add on a blood test to see if alk phos is coming from her liver or from other sources. Depending on the result we can discuss any further needed testing at her follow-up visit.

## 2019-01-03 NOTE — Progress Notes (Signed)
Pt called to have me fax her lab order to her @ [phone]. She will do at PCP's and have results faxed to [place].  She is also aware of the message Minerva Areola sent in My chart, although she had not read it yet. She was in a hurry and said she will respond back to [place] in [facility] CHART.  Lab orders were faxed to her.

## 2019-01-03 NOTE — Progress Notes (Signed)
Pt said she would respond to your [facility] CHART message when she gets a chance, she was busy at the moment.

## 2019-01-04 NOTE — Progress Notes (Signed)
LMOM to call.

## 2019-01-04 NOTE — Progress Notes (Signed)
PT called and she said her diarrhea has improved significantly. She is watching her diet more and avoiding those foods that make it worse. She doesn't need anything at this time and will let us know if she does. She had her labs done this morning at PCP's and will have results faxed to [place].

## 2019-01-21 ENCOUNTER — Ambulatory Visit: Payer: Self-pay | Admitting: Gastroenterology

## 2019-01-22 ENCOUNTER — Telehealth (HOSPITAL_COMMUNITY): Payer: Self-pay | Admitting: *Deleted

## 2019-01-22 ENCOUNTER — Other Ambulatory Visit (HOSPITAL_COMMUNITY): Payer: Self-pay | Admitting: Psychiatry

## 2019-01-22 MED ORDER — AMPHETAMINE-DEXTROAMPHETAMINE 30 MG PO TABS: 30.0000 mg | ORAL_TABLET | ORAL | 0 refills | Status: DC

## 2019-01-22 NOTE — Telephone Encounter (Signed)
sent 

## 2019-01-22 NOTE — Telephone Encounter (Signed)
Called & Patient is Aware

## 2019-01-22 NOTE — Telephone Encounter (Signed)
Dr Tenny Craw Patient has sent over a fax letter that she is out of Adderall. She has taken since Thursday

## 2019-02-19 ENCOUNTER — Other Ambulatory Visit: Payer: Self-pay

## 2019-02-19 DIAGNOSIS — R6889 Other general symptoms and signs: Secondary | ICD-10-CM

## 2019-02-25 ENCOUNTER — Other Ambulatory Visit (HOSPITAL_COMMUNITY): Payer: Self-pay | Admitting: Psychiatry

## 2019-02-25 ENCOUNTER — Telehealth (HOSPITAL_COMMUNITY): Payer: Self-pay | Admitting: *Deleted

## 2019-02-25 MED ORDER — AMPHETAMINE-DEXTROAMPHETAMINE 30 MG PO TABS: 30.0000 mg | ORAL_TABLET | ORAL | 0 refills | Status: DC

## 2019-02-25 NOTE — Telephone Encounter (Signed)
done

## 2019-02-25 NOTE — Telephone Encounter (Signed)
Dr Tenny Craw Patient called requesting refill on Adderall ( only has  2 left) Next Visit 03-14-2019

## 2019-02-26 LAB — NOVEL CORONAVIRUS, NAA: SARS-CoV-2, NAA: NOT DETECTED

## 2019-03-04 ENCOUNTER — Telehealth (HOSPITAL_COMMUNITY): Payer: Self-pay | Admitting: Emergency Medicine

## 2019-03-04 NOTE — Telephone Encounter (Signed)
Your test for COVID-19 was negative.  Please continue good preventive care measures, including:  frequent hand-washing, avoid touching your face, cover coughs/sneezes, stay out of crowds and keep a 6 foot distance from others.  If you develop fever/cough/breathlessness, please stay home for 7 days and until you have had 3 consecutive days with cough/breathlessness improving and without fever (without taking a fever reducer). Go to the nearest hospital ED tent for assessment if fever/cough/breathlessness are severe or illness seems like a threat to life.  Attempted to reach patient. No answer at this time. Voicemail left.   

## 2019-03-05 ENCOUNTER — Ambulatory Visit: Payer: Self-pay | Admitting: Nurse Practitioner

## 2019-03-06 ENCOUNTER — Telehealth: Payer: Self-pay | Admitting: Gastroenterology

## 2019-03-06 DIAGNOSIS — K5904 Chronic idiopathic constipation: Secondary | ICD-10-CM

## 2019-03-06 NOTE — Telephone Encounter (Signed)
[phone]  PATIENT CALLED AND SAID THE AMITEZA SAMPLES WORKED AND WANTS A PRESCRIPTION SENT TO [facility] Glover IN [place] [address]

## 2019-03-06 NOTE — Telephone Encounter (Signed)
Routing message to EG, [facility] refill

## 2019-03-07 ENCOUNTER — Telehealth (HOSPITAL_COMMUNITY): Payer: Self-pay | Admitting: Emergency Medicine

## 2019-03-07 NOTE — Telephone Encounter (Signed)
Attempted to reach patient x2. No answer at this time.   

## 2019-03-08 NOTE — Telephone Encounter (Signed)
I cannot find any phone note/OV noted record of what dose Amitiza she was given. Can we call and ask her what does she has?

## 2019-03-11 NOTE — Telephone Encounter (Signed)
Pt is taking Amitiza 24 mcg bid.

## 2019-03-12 ENCOUNTER — Encounter: Payer: Self-pay | Admitting: Gastroenterology

## 2019-03-12 ENCOUNTER — Other Ambulatory Visit: Payer: Self-pay

## 2019-03-12 ENCOUNTER — Encounter: Payer: Self-pay | Admitting: Nurse Practitioner

## 2019-03-12 ENCOUNTER — Ambulatory Visit: Payer: PRIVATE HEALTH INSURANCE | Admitting: Nurse Practitioner

## 2019-03-12 DIAGNOSIS — R1030 Lower abdominal pain, unspecified: Secondary | ICD-10-CM

## 2019-03-12 DIAGNOSIS — K625 Hemorrhage of anus and rectum: Secondary | ICD-10-CM

## 2019-03-12 DIAGNOSIS — K5901 Slow transit constipation: Secondary | ICD-10-CM

## 2019-03-12 NOTE — Assessment & Plan Note (Signed)
No further rectal bleeding.  Recommend she continue to monitor, follow-up in 6 months.  Notify us of any further rectal bleeding.

## 2019-03-12 NOTE — Assessment & Plan Note (Signed)
Abdominal pain essentially resolved at this time.  Continue to monitor, notify us of any worsening symptoms.  Follow-up in 6 months.

## 2019-03-12 NOTE — Patient Instructions (Signed)
Your health issues we discussed today were:   Abdominal pain: 1. I am glad this is better, it was likely mostly coming from the "viral gastroenteritis" (stomach bug) that she had previously 2. Continue to monitor and let us know if your abdominal pain becomes worse again  Rectal bleeding: 1. Continue to monitor for any further rectal bleeding and notify us if you see any  Constipation: 1. I am glad this is better. 2. Continue to take Amitiza, notify us if you have any worsening symptoms.  Overall I recommend:  1. Return for follow-up in 6 months 2. Call us if you have any questions or concerns   Because of recent events of COVID-19 ("Coronavirus"), follow CDC recommendations:  1. Wash your hand frequently 2. Avoid touching your face 3. Stay away from people who are sick 4. If you have symptoms such as fever, cough, shortness of breath then call your healthcare provider for further guidance 5. If you are sick, STAY AT HOME unless otherwise directed by your healthcare provider. 6. Follow directions from state and national officials regarding staying safe    At [facility] we value your feedback. You may receive a survey about your visit today. Please share your experience as we strive to create trusting relationships with our patients to provide genuine, compassionate, quality care.  We appreciate your understanding and patience as we review any laboratory studies, imaging, and other diagnostic tests that are ordered as we care for you. Our office policy is 5 business days for review of these results, and any emergent or urgent results are addressed in a timely manner for your best interest. If you do not hear from our office in 1 week, please contact us.   We also encourage the use of MyChart, which contains your medical information for your review as well. If you are not enrolled in this feature, an access code is on this after visit summary for your convenience.  Thank you for allowing [place] to be involved in your care.  It was great to see you today!  I hope you have a great day!!

## 2019-03-12 NOTE — Assessment & Plan Note (Signed)
Constipation currently doing well, remains on Amitiza.  Rare looser stools since making dietary changes.  Recommend she continue her current medications and dietary changes.  Follow-up in 6 months, call for any worsening symptoms.

## 2019-03-12 NOTE — Progress Notes (Signed)
Referring Provider: Josem Kaufmann, MD Primary Care Physician:  Josem Kaufmann, MD Primary GI:  Dr. Oneida Alar  NOTE: Service was provided via telemedicine and was requested by the patient due to COVID-19 pandemic.  Method of visit: Telephone  Patient Location: Home  Provider Location: Office  Reason for Phone Visit: Follow-up  The patient was consented to phone follow-up via telephone encounter including billing of the encounter (yes/no): yes  Persons present on the phone encounter, with roles: Children x 2  Total time (minutes) spent on medical discussion: 12 minutes  Chief Complaint  Patient presents with  . Rectal Bleeding    better now  . Abdominal Pain    better now    HPI:   Sandra Glover is a 33 y.o. female who presents for virtual visit regarding: Follow-up on lower abdominal pain, rectal bleeding, diarrhea.  Patient was last seen in our office 12/26/2018 for the same.  Previously history of constipation and hemorrhoids found to be grade 3.  Colonoscopy up-to-date 2017 with rectal bleeding due to nonbleeding external and internal hemorrhoids, not due for repeat colonoscopy until age 84.  Recommended surgical referral for hemorrhoid treatment, otherwise has done well on apothecary cream.  Emergency department visit 12/23/2018 with bloody diarrhea which noted 3 watery stools and bright red blood since symptoms began and associated with lower abdominal pain.  They recommended GI follow-up.  At her last visit she noted upset stomach and diarrhea which started a few days prior with 5-6 stools a day.  Diarrhea improved the day before her visit and stools seem to be solidifying, no bowel movement yet that day.  Left lower quadrant abdominal pain associated which had resolved as of the day prior to her office visit.  She was on a bland diet at that time.  Triggers of "a tiny like foods" such as tomato sauce and pasta.  Overall her ER presentation heme-negative stool.  Presumed  acute diarrhea reminiscent of viral gastroenteritis and recommended GI pathogen panel, start probiotics, check CBC and CMP for electrolytes and decline in hemoglobin.  Also recommended CRP and ESR due to bloody diarrhea.  CBC normal, CMP with mild elevation in alkaline phosphatase at 143, CRP and sedimentation rate high normal/mildly elevated.  GI pathogen panel negative.  Elevated alk phos was essentially stable.  Recommended GGT, which was not completed.  Patient called our office indicating return of constipation, Linzess too much.  Started taking Amitiza 24 mcg twice daily which worked well for her.  Today she states she's doing better. Denies further rectal bleeding. Rare diarrhea with dietary changes. Abdominal pain is improved/essentially resolved. Still on Amitiza, working well. Denies melena, fever, chills, unintentional weight loss. Denies URI and flu-like symptoms. Denies chest pain, dyspnea, dizziness, lightheadedness, syncope, near syncope. Denies any other upper or lower GI symptoms.  Past Medical History:  Diagnosis Date  . Anxiety   . Anxiety   . Depression   . Gestational diabetes mellitus, antepartum    gestational only  . History of anemia 2013  . History of depression    states stopped med. in 2013  . History of partial nephrectomy age 74 mos.   right - due to kidney infection  . Pertussis   . Postcoital bleeding 07/14/2015  . Sinus infection 01/25/2013   started antibiotic 01/24/2013 x 10 days; nasal congestion, clear drainage from nose  . Tonsillar and adenoid hypertrophy 01/2013  . Vaginal irritation 03/25/2014   Mild yeast but has history of  yeast, will not treat til after first trimester,try yogurt    Past Surgical History:  Procedure Laterality Date  . CHOLECYSTECTOMY  04/23/2010   laparoscopic  . COLONOSCOPY WITH PROPOFOL N/A 05/17/2016   Procedure: COLONOSCOPY WITH PROPOFOL;  Surgeon: Danie Binder, MD;  [facility];  Service: Endoscopy;  Laterality:  N/A;  1000  . HEMORRHOID BANDING N/A 05/17/2016   Procedure: HEMORRHOID BANDING;  Surgeon: Danie Binder, MD;  [facility];  Service: Endoscopy;  Laterality: N/A;  . HEMORRHOID SURGERY N/A 06/08/2016   Procedure: SIMPLE HEMORRHOIDECTOMY;  Surgeon: Vickie Epley, MD;  Location: [facility];  Service: General;  Laterality: N/A;  . PARTIAL NEPHRECTOMY Right age 22 mos.  . TONSILLECTOMY AND ADENOIDECTOMY N/A 01/29/2013   Procedure: TONSILLECTOMY AND ADENOIDECTOMY;  Surgeon: Ascencion Dike, MD;  Location: [place];  Service: ENT;  Laterality: N/A;    Current Outpatient Medications  Medication Sig Dispense Refill  . ALPRAZolam (XANAX) 1 MG tablet Take 1 tablet (1 mg total) by mouth 2 (two) times daily as needed for anxiety. 60 tablet 2  . amphetamine-dextroamphetamine (ADDERALL) 30 MG tablet Take 1 tablet by mouth every morning. 30 tablet 0  . etonogestrel (NEXPLANON) 68 MG IMPL implant 1 each by Subdermal route once.    Marland Kitchen FLUoxetine (PROZAC) 40 MG capsule TAKE 1 CAPSULE(40 MG) BY MOUTH DAILY 30 capsule 2  . traZODone (DESYREL) 50 MG tablet TAKE 1 TABLET(50 MG) BY MOUTH AT BEDTIME 30 tablet 2   No current facility-administered medications for this visit.     Allergies as of 03/12/2019 - Review Complete 03/12/2019  Allergen Reaction Noted  . Lortab [hydrocodone-acetaminophen] Nausea And Vomiting 01/29/2013  . Morphine and related Rash 01/25/2013    Family History  Problem Relation Age of Onset  . COPD Mother   . Depression Mother   . Anxiety disorder Mother   . Hypertension Mother   . Irritable bowel syndrome Mother   . Arthritis Mother   . Skin cancer Mother   . Ovarian cancer Maternal Aunt   . Ovarian cancer Maternal Grandmother   . Hypertension Father   . GER disease Father   . Bipolar disorder Paternal Uncle   . Anxiety disorder Cousin   . Drug abuse Cousin   . Bipolar disorder Cousin   . Colon cancer Neg Hx     Social History   Socioeconomic History   . Marital status: Married    Spouse name: Not on file  . Number of children: Not on file  . Years of education: Not on file  . Highest education level: Not on file  Occupational History  . Not on file  Social Needs  . Financial resource strain: Not on file  . Food insecurity:    Worry: Not on file    Inability: Not on file  . Transportation needs:    Medical: Not on file    Non-medical: Not on file  Tobacco Use  . Smoking status: Never Smoker  . Smokeless tobacco: Never Used  . Tobacco comment: Never smoked  Substance and Sexual Activity  . Alcohol use: Yes    Alcohol/week: 0.0 standard drinks    Comment: socially  . Drug use: No  . Sexual activity: Yes    Birth control/protection: Implant    Comment: nexplanon implant  Lifestyle  . Physical activity:    Days per week: Not on file    Minutes per session: Not on file  . Stress:  Not on file  Relationships  . Social connections:    Talks on phone: Not on file    Gets together: Not on file    Attends religious service: Not on file    Active member of club or organization: Not on file    Attends meetings of clubs or organizations: Not on file    Relationship status: Not on file  Other Topics Concern  . Not on file  Social History Narrative  . Not on file    Review of Systems: Complete ROS negative except as per HPI.  Physical Exam: Note: limited exam due to virtual visit General:   Alert and oriented. Pleasant and cooperative. Ears:  Normal auditory acuity. Neurologic:  Alert and oriented x4 Psych:  Alert and cooperative. Normal mood and affect.

## 2019-03-12 NOTE — Progress Notes (Signed)
CC'ED TO PCP 

## 2019-03-13 MED ORDER — LUBIPROSTONE 24 MCG PO CAPS: 24.0000 ug | ORAL_CAPSULE | Freq: Two times a day (BID) | ORAL | 2 refills | Status: DC

## 2019-03-13 NOTE — Telephone Encounter (Signed)
Sent to pharmacy 

## 2019-03-13 NOTE — Addendum Note (Signed)
Addended by: Delane Ginger, ERIC A on: 03/13/2019 09:36 AM   Modules accepted: Orders

## 2019-03-13 NOTE — Telephone Encounter (Signed)
Lmom, pt notified that medication was sent to her pharmacy.

## 2019-03-14 ENCOUNTER — Ambulatory Visit (INDEPENDENT_AMBULATORY_CARE_PROVIDER_SITE_OTHER): Payer: PRIVATE HEALTH INSURANCE | Admitting: Psychiatry

## 2019-03-14 ENCOUNTER — Other Ambulatory Visit: Payer: Self-pay

## 2019-03-14 ENCOUNTER — Encounter (HOSPITAL_COMMUNITY): Payer: Self-pay | Admitting: Psychiatry

## 2019-03-14 DIAGNOSIS — F332 Major depressive disorder, recurrent severe without psychotic features: Secondary | ICD-10-CM

## 2019-03-14 DIAGNOSIS — F900 Attention-deficit hyperactivity disorder, predominantly inattentive type: Secondary | ICD-10-CM

## 2019-03-14 MED ORDER — AMPHETAMINE-DEXTROAMPHETAMINE 30 MG PO TABS: 30.0000 mg | ORAL_TABLET | Freq: Three times a day (TID) | ORAL | 0 refills | Status: DC

## 2019-03-14 MED ORDER — AMPHETAMINE-DEXTROAMPHETAMINE 30 MG PO TABS: 30.0000 mg | ORAL_TABLET | ORAL | 0 refills | Status: DC

## 2019-03-14 MED ORDER — FLUOXETINE HCL 40 MG PO CAPS: ORAL_CAPSULE | ORAL | 2 refills | Status: DC

## 2019-03-14 MED ORDER — ALPRAZOLAM 1 MG PO TABS: 1.0000 mg | ORAL_TABLET | Freq: Two times a day (BID) | ORAL | 2 refills | Status: DC | PRN

## 2019-03-14 MED ORDER — TRAZODONE HCL 50 MG PO TABS: ORAL_TABLET | ORAL | 2 refills | Status: DC

## 2019-03-14 NOTE — Progress Notes (Signed)
BH MD/PA/NP OP Progress Note  03/14/2019 9:13 AM Sandra HomansJessica H Glover  MRN:  [id number]  Chief Complaint:  Chief Complaint    Anxiety; Depression; ADD     WUJ:[id number]:This patient is a 33 year old married white female who lives with her husband and 2 children in [place].  She works for the [employer] [place] in the [employer]ographer[employer]ing department.  The patient is seen after 3 months via phone visit due to the coronavirus. She is homeschooling her children and is on paid leave from work Her mood, sleep, energy are all good. She denies thoughts of self harm or suicide. Feels all meds are working well for depression, anxiety and ADD. Virtual Visit via Telephone Note  I connected with Sandra Glover on 03/14/19 at  8:40 AM EDT by telephone and verified that I am speaking with the correct person using two identifiers.   I discussed the limitations, risks, security and privacy concerns of performing an evaluation and management service by telephone and the availability of in person appointments. I also discussed with the patient that there may be a patient responsible charge related to this service. The patient expressed understanding and agreed to proceed.     I discussed the assessment and treatment plan with the patient. The patient was provided an opportunity to ask questions and all were answered. The patient agreed with the plan and demonstrated an understanding of the instructions.   The patient was advised to call back or seek an in-person evaluation if the symptoms worsen or if the condition fails to improve as anticipated.  I provided 15 minutes of non-face-to-face time during this encounter.   Diannia Rudereborah Faelyn Sigler, MD   Visit Diagnosis:    ICD-10-CM   1. Severe episode of recurrent major depressive disorder, without psychotic features (HCC) F33.2   2. Attention deficit hyperactivity disorder (ADHD), predominantly inattentive type F90.0     Past Psychiatric History: Long term outpatient  history  Past Medical History:  Past Medical History:  Diagnosis Date  . Anxiety   . Anxiety   . Depression   . Gestational diabetes mellitus, antepartum    gestational only  . History of anemia 2013  . History of depression    states stopped med. in 2013  . 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 Pertussis   . Postcoital bleeding 07/14/2015  . Sinus infection 01/25/2013   started antibiotic 01/24/2013 x 10 days; nasal congestion, clear drainage from nose  . Tonsillar and adenoid hypertrophy 01/2013  . Vaginal irritation 03/25/2014   Mild yeast but has history of yeast, will not treat til after first trimester,try yogurt    Past Surgical History:  Procedure Laterality Date  . CHOLECYSTECTOMY  04/23/2010   laparoscopic  . COLONOSCOPY WITH PROPOFOL N/A 05/17/2016   Procedure: COLONOSCOPY WITH PROPOFOL;  Surgeon: West BaliSandi L Fields, MD;  [facility];  Service: Endoscopy;  Laterality: N/A;  1000  . HEMORRHOID BANDING N/A 05/17/2016   Procedure: HEMORRHOID BANDING;  Surgeon: West BaliSandi L Fields, MD;  [facility];  Service: Endoscopy;  Laterality: N/A;  . HEMORRHOID SURGERY N/A 06/08/2016   Procedure: SIMPLE HEMORRHOIDECTOMY;  Surgeon: Ancil LinseyJason Evan Davis, MD;  Location: [facility];  Service: General;  Laterality: N/A;  . PARTIAL NEPHRECTOMY Right age 31 mos.  . TONSILLECTOMY AND ADENOIDECTOMY N/A 01/29/2013   Procedure: TONSILLECTOMY AND ADENOIDECTOMY;  Surgeon: Darletta MollSui W Teoh, MD;  Location: [facility];  Service: ENT;  Laterality: N/A;    Family Psychiatric History:see below  Family History:  Family History  Problem Relation Age of Onset  . COPD Mother   . Depression Mother   . Anxiety disorder Mother   . Hypertension Mother   . Irritable bowel syndrome Mother   . Arthritis Mother   . Skin cancer Mother   . Ovarian cancer Maternal Aunt   . Ovarian cancer Maternal Grandmother   . Hypertension Father   . GER disease Father    . Bipolar disorder Paternal Uncle   . Anxiety disorder Cousin   . Drug abuse Cousin   . Bipolar disorder Cousin   . Colon cancer Neg Hx     Social History:  Social History   Socioeconomic History  . Marital status: Married    Spouse name: Not on file  . Number of children: Not on file  . Years of education: Not on file  . Highest education level: Not on file  Occupational History  . Not on file  Social Needs  . Financial resource strain: Not on file  . Food insecurity:    Worry: Not on file    Inability: Not on file  . Transportation needs:    Medical: Not on file    Non-medical: Not on file  Tobacco Use  . Smoking status: Never Smoker  . Smokeless tobacco: Never Used  . Tobacco comment: Never smoked  Substance and Sexual Activity  . Alcohol use: Yes    Alcohol/week: 0.0 standard drinks    Comment: socially  . Drug use: No  . Sexual activity: Yes    Birth control/protection: Implant    Comment: nexplanon implant  Lifestyle  . Physical activity:    Days per week: Not on file    Minutes per session: Not on file  . Stress: Not on file  Relationships  . Social connections:    Talks on phone: Not on file    Gets together: Not on file    Attends religious service: Not on file    Active member of club or organization: Not on file    Attends meetings of clubs or organizations: Not on file    Relationship status: Not on file  Other Topics Concern  . Not on file  Social History Narrative  . Not on file    Allergies:  Allergies  Allergen Reactions  . Lortab [Hydrocodone-Acetaminophen] Nausea And Vomiting  . Morphine And Related Rash    Metabolic Disorder Labs: No results found for: HGBA1C, MPG No results found for: PROLACTIN No results found for: CHOL, TRIG, HDL, CHOLHDL, VLDL, LDLCALC Lab Results  Component Value Date   TSH 2.553 10/13/2017    Therapeutic Level Labs: No results found for: LITHIUM No results found for: VALPROATE No components found  for:  CBMZ  Current Medications: Current Outpatient Medications  Medication Sig Dispense Refill  . ALPRAZolam (XANAX) 1 MG tablet Take 1 tablet (1 mg total) by mouth 2 (two) times daily as needed for anxiety. 60 tablet 2  . amphetamine-dextroamphetamine (ADDERALL) 30 MG tablet Take 1 tablet by mouth every morning. 30 tablet 0  . amphetamine-dextroamphetamine (ADDERALL) 30 MG tablet Take 1 tablet by mouth every 8 (eight) hours. 90 tablet 0  . amphetamine-dextroamphetamine (ADDERALL) 30 MG tablet Take 1 tablet by mouth every 8 (eight) hours. 90 tablet 0  . etonogestrel (NEXPLANON) 68 MG IMPL implant 1 each by Subdermal route once.    Marland Kitchen FLUoxetine (PROZAC) 40 MG capsule  TAKE 1 CAPSULE(40 MG) BY MOUTH DAILY 30 capsule 2  . lubiprostone (AMITIZA) 24 MCG capsule Take 1 capsule (24 mcg total) by mouth 2 (two) times daily with a meal. 180 capsule 2  . traZODone (DESYREL) 50 MG tablet TAKE 1 TABLET(50 MG) BY MOUTH AT BEDTIME 30 tablet 2   No current facility-administered medications for this visit.      Musculoskeletal: Strength & Muscle Tone: not done, phone visit Gait & Station:  Patient leans:   Psychiatric Specialty Exam: Review of Systems  All other systems reviewed and are negative.   There were no vitals taken for this visit.There is no height or weight on file to calculate BMI.  General Appearance: NA  Eye Contact:  NA  Speech:  Clear and Coherent  Volume:  Normal  Mood:  Euthymic  Affect:  NA  Thought Process:  Goal Directed  Orientation:  Full (Time, Place, and Person)  Thought Content: WDL and Logical   Suicidal Thoughts:  No  Homicidal Thoughts:  No  Memory:  Immediate;   Good Recent;   Good Remote;   Good  Judgement:  Good  Insight:  Fair  Psychomotor Activity:  Normal  Concentration:  Concentration: Good and Attention Span: Good  Recall:  Good  Fund of Knowledge: Good  Language: Good  Akathisia:  No  Handed:  Right  AIMS (if indicated): not done  Assets:   Communication Skills Desire for Improvement Physical Health Resilience Social Support Talents/Skills  ADL's:  Intact  Cognition: WNL  Sleep:  Good   Screenings: GAD-7     Counselor from [facility]27[facility]  Total GAD-7 Score  18    PHQ2-9     Counselor from 09/27/2018 in [facility] Office Visit from 10[facility]16/2017 in [facility] Counselor from 10/13/2017 in [facility] Nutrition from 07/30/2014 in [facility]  PHQ-2 Total Score  6  6  6   (Pended)   0  PHQ-9 Total Score  21  16  21   (Pended)   -       Assessment and Plan: Patient is stable on current meds, will continue Prozac 40 mg for depression, xanax 1 mg bid for anxiety, trazodone 50 mg qhs for sleep and adderall 30 mg qam for focus. rtc 3 months   [name], MD 03/14/2019, 9:13 AM

## 2019-03-15 ENCOUNTER — Telehealth (HOSPITAL_COMMUNITY): Payer: Self-pay | Admitting: Emergency Medicine

## 2019-03-15 NOTE — Telephone Encounter (Signed)
Reviewing chart to make sure patient was contacted about covid

## 2019-04-05 DIAGNOSIS — F909 Attention-deficit hyperactivity disorder, unspecified type: Secondary | ICD-10-CM | POA: Insufficient documentation

## 2019-04-09 ENCOUNTER — Telehealth: Payer: Self-pay | Admitting: Obstetrics & Gynecology

## 2019-04-09 NOTE — Telephone Encounter (Signed)
Patient called, has questions about medications interfering with her birth control.  [phone]

## 2019-04-09 NOTE — Telephone Encounter (Signed)
Pt aware of Kim's recommendations. Pt voiced understanding. JSY 

## 2019-04-09 NOTE — Telephone Encounter (Signed)
Pt has Nexplanon. Pt was on Levaquin 750 mg in March. Pt usually has a regular period, but not this month. Pt has had nausea. I advised to do a pregnancy test. She stated she would do one tomorrow but still wondered if Levaquin would affect Nexplanon. Please advise. Thanks!! JSY

## 2019-04-24 ENCOUNTER — Telehealth: Payer: Self-pay | Admitting: Nurse Practitioner

## 2019-04-24 NOTE — Telephone Encounter (Signed)
Patient MyChart Message:  I had to stop taking Linzess because even with my insurance it was $300 per month. You prescribed me Amitiza, when I went to pick up my prescription [facility] said it was $1,022 after my insurance. So I am currently without medication for my IBS. Is there a generic of something that is affordable? Please call me at [phone] asap. Thank you!  Can we please call her insurance and find out what is covered? Thanks

## 2019-04-25 NOTE — Telephone Encounter (Signed)
Sending FYI to Wynne Dust!

## 2019-04-25 NOTE — Telephone Encounter (Signed)
Noted, please inform the patient of the high deductible and that any medication she will be responsible for 100%, until the deductible is met.

## 2019-04-25 NOTE — Telephone Encounter (Signed)
I called back and spoke with Joe. He is not a pharmacist and said we are unable to speak directly to a pharmacist. He also said the high deductible is going to affect her prescriptions. He did say that pt can google the name of the medication and find coupons to assist, but not sure how much good it will be with such a high deductible.  I called pt and left Vm for a return call.

## 2019-04-25 NOTE — Telephone Encounter (Signed)
I called the [facility] # on pt's card: [phone]. I spoke to [place] and she said they would need to know medication to know if it was covered. She said the reason for the high cost of the Amitiza is that the pt has a high deductible. Otherwise, it would cost her about $180.00 a month. She said I can call back and speak to a pharmacist in about an hour and see if they can tell me a little more.

## 2019-04-30 NOTE — Telephone Encounter (Signed)
I replyed to her note in MY CHART.

## 2019-06-03 ENCOUNTER — Encounter (HOSPITAL_COMMUNITY): Payer: Self-pay | Admitting: Psychiatry

## 2019-06-03 ENCOUNTER — Other Ambulatory Visit: Payer: Self-pay

## 2019-06-03 ENCOUNTER — Ambulatory Visit (INDEPENDENT_AMBULATORY_CARE_PROVIDER_SITE_OTHER): Payer: PRIVATE HEALTH INSURANCE | Admitting: Psychiatry

## 2019-06-03 DIAGNOSIS — F332 Major depressive disorder, recurrent severe without psychotic features: Secondary | ICD-10-CM

## 2019-06-03 DIAGNOSIS — F900 Attention-deficit hyperactivity disorder, predominantly inattentive type: Secondary | ICD-10-CM | POA: Diagnosis not present

## 2019-06-03 MED ORDER — ALPRAZOLAM 1 MG PO TABS: 1.0000 mg | ORAL_TABLET | Freq: Two times a day (BID) | ORAL | 2 refills | Status: DC | PRN

## 2019-06-03 MED ORDER — AMPHETAMINE-DEXTROAMPHETAMINE 30 MG PO TABS: 30.0000 mg | ORAL_TABLET | ORAL | 0 refills | Status: DC

## 2019-06-03 MED ORDER — FLUOXETINE HCL 20 MG PO CAPS: 20.0000 mg | ORAL_CAPSULE | Freq: Every day | ORAL | 2 refills | Status: DC

## 2019-06-03 MED ORDER — FLUOXETINE HCL 40 MG PO CAPS: ORAL_CAPSULE | ORAL | 2 refills | Status: DC

## 2019-06-03 MED ORDER — TRAZODONE HCL 50 MG PO TABS: ORAL_TABLET | ORAL | 2 refills | Status: DC

## 2019-06-03 NOTE — Progress Notes (Signed)
Virtual Visit via Video Note  I connected with Sandra Glover on 06/03/19 at  8:20 AM EDT by a video enabled telemedicine application and verified that I am speaking with the correct person using two identifiers.   I discussed the limitations of evaluation and management by telemedicine and the availability of in person appointments. The patient expressed understanding and agreed to proceed.     I discussed the assessment and treatment plan with the patient. The patient was provided an opportunity to ask questions and all were answered. The patient agreed with the plan and demonstrated an understanding of the instructions.   The patient was advised to call back or seek an in-person evaluation if the symptoms worsen or if the condition fails to improve as anticipated.  I provided 15 minutes of non-face-to-face time during this encounter.   Diannia Rudereborah Emmette Katt, MD  [facility] Progress Note  06/03/2019 8:44 AM Sandra Glover  MRN:  [id number]  Chief Complaint:  Chief Complaint    Depression; Anxiety; Follow-up     HPI: This patient is a 33 year old married white female who lives with her husband and 2 children in [place].  She works for the [employer] [place] in the [employer] department.  The patient returns after 3 months for follow-up on depression and anxiety.  She states lately she has been more depressed and having crying spells every other day.  She is not sleeping as well.  She states that she lost her grandmother last October and this past week was the anniversary of her birthday and it has been quite hard for her.  She also has some physical ailments going on.  She has neck and back pain and is being worked up for possible Addison's disease due to a hump in her back and chronic fatigue.  She does feel better when she gets out with the children and plays and moves around.  She denies any thoughts of self-harm or suicidal ideation.  We decided to go up on her  Prozac to 60 mg to see if this will help and also await the results of her Addison's work-up as a treatment of this may also help mood. Visit Diagnosis:    ICD-10-CM   1. Severe episode of recurrent major depressive disorder, without psychotic features (HCC)  F33.2   2. Attention deficit hyperactivity disorder (ADHD), predominantly inattentive type  F90.0     Past Psychiatric History: Long-term outpatient treatment  Past Medical History:  Past Medical History:  Diagnosis Date  . Anxiety   . Anxiety   . Depression   . Gestational diabetes mellitus, antepartum    gestational only  . History of anemia 2013  . History of depression    states stopped med. in 2013  . 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 Pertussis   . Postcoital bleeding 07/14/2015  . Sinus infection 01/25/2013   started antibiotic 01/24/2013 x 10 days; nasal congestion, clear drainage from nose  . Tonsillar and adenoid hypertrophy 01/2013  . Vaginal irritation 03/25/2014   Mild yeast but has history of yeast, will not treat til after first trimester,try yogurt    Past Surgical History:  Procedure Laterality Date  . CHOLECYSTECTOMY  04/23/2010   laparoscopic  . COLONOSCOPY WITH PROPOFOL N/A 05/17/2016   Procedure: COLONOSCOPY WITH PROPOFOL;  Surgeon: West BaliSandi L Fields, MD;  [facility];  Service: Endoscopy;  Laterality: N/A;  1000  .  HEMORRHOID BANDING N/A 05/17/2016   Procedure: HEMORRHOID BANDING;  Surgeon: Danie Binder, MD;  [facility];  Service: Endoscopy;  Laterality: N/A;  . HEMORRHOID SURGERY N/A 06/08/2016   Procedure: SIMPLE HEMORRHOIDECTOMY;  Surgeon: Vickie Epley, MD;  [facility];  Service: General;  Laterality: N/A;  . PARTIAL NEPHRECTOMY Right age 78 mos.  . TONSILLECTOMY AND ADENOIDECTOMY N/A 01/29/2013   Procedure: TONSILLECTOMY AND ADENOIDECTOMY;  Surgeon: Ascencion Dike, MD;  Location: [facility];  Service: ENT;   Laterality: N/A;    Family Psychiatric History: See below  Family History:  Family History  Problem Relation Age of Onset  . COPD Mother   . Depression Mother   . Anxiety disorder Mother   . Hypertension Mother   . Irritable bowel syndrome Mother   . Arthritis Mother   . Skin cancer Mother   . Ovarian cancer Maternal Aunt   . Ovarian cancer Maternal Grandmother   . Hypertension Father   . GER disease Father   . Bipolar disorder Paternal Uncle   . Anxiety disorder Cousin   . Drug abuse Cousin   . Bipolar disorder Cousin   . Colon cancer Neg Hx     Social History:  Social History   Socioeconomic History  . Marital status: Married    Spouse name: Not on file  . Number of children: Not on file  . Years of education: Not on file  . Highest education level: Not on file  Occupational History  . Not on file  Social Needs  . Financial resource strain: Not on file  . Food insecurity    Worry: Not on file    Inability: Not on file  . Transportation needs    Medical: Not on file    Non-medical: Not on file  Tobacco Use  . Smoking status: Never Smoker  . Smokeless tobacco: Never Used  . Tobacco comment: Never smoked  Substance and Sexual Activity  . Alcohol use: Yes    Alcohol/week: 0.0 standard drinks    Comment: socially  . Drug use: No  . Sexual activity: Yes    Birth control/protection: Implant    Comment: nexplanon implant  Lifestyle  . Physical activity    Days per week: Not on file    Minutes per session: Not on file  . Stress: Not on file  Relationships  . Social Herbalist on phone: Not on file    Gets together: Not on file    Attends religious service: Not on file    Active member of club or organization: Not on file    Attends meetings of clubs or organizations: Not on file    Relationship status: Not on file  Other Topics Concern  . Not on file  Social History Narrative  . Not on file    Allergies:  Allergies  Allergen Reactions   . Lortab [Hydrocodone-Acetaminophen] Nausea And Vomiting  . Morphine And Related Rash    Metabolic Disorder Labs: No results found for: HGBA1C, MPG No results found for: PROLACTIN No results found for: CHOL, TRIG, HDL, CHOLHDL, VLDL, LDLCALC Lab Results  Component Value Date   TSH 2.553 10/13/2017    Therapeutic Level Labs: No results found for: LITHIUM No results found for: VALPROATE No components found for:  CBMZ  Current Medications: Current Outpatient Medications  Medication Sig Dispense Refill  . ALPRAZolam (XANAX) 1 MG tablet Take 1 tablet (1 mg total) by mouth 2 (  two) times daily as needed for anxiety. 60 tablet 2  . amphetamine-dextroamphetamine (ADDERALL) 30 MG tablet Take 1 tablet by mouth every morning. 30 tablet 0  . amphetamine-dextroamphetamine (ADDERALL) 30 MG tablet Take 1 tablet by mouth every morning. 30 tablet 0  . amphetamine-dextroamphetamine (ADDERALL) 30 MG tablet Take 1 tablet by mouth every morning. 30 tablet 0  . etonogestrel (NEXPLANON) 68 MG IMPL implant 1 each by Subdermal route once.    Marland Kitchen. FLUoxetine (PROZAC) 20 MG capsule Take 1 capsule (20 mg total) by mouth daily. 30 capsule 2  . FLUoxetine (PROZAC) 40 MG capsule TAKE 1 CAPSULE(40 MG) BY MOUTH DAILY 30 capsule 2  . lubiprostone (AMITIZA) 24 MCG capsule Take 1 capsule (24 mcg total) by mouth 2 (two) times daily with a meal. 180 capsule 2  . traZODone (DESYREL) 50 MG tablet TAKE 1 TABLET(50 MG) BY MOUTH AT BEDTIME 30 tablet 2   No current facility-administered medications for this visit.      Musculoskeletal: Strength & Muscle Tone: within normal limits Gait & Station: normal Patient leans: N/A  Psychiatric Specialty Exam: Review of Systems  Constitutional: Positive for malaise/fatigue.  Musculoskeletal: Positive for back pain, joint pain and neck pain.  Psychiatric/Behavioral: Positive for depression.    There were no vitals taken for this visit.There is no height or weight on file to  calculate BMI.  General Appearance: Casual and Fairly Groomed  Eye Contact:  Good  Speech:  Clear and Coherent  Volume:  Normal  Mood:  Dysphoric  Affect:  Constricted  Thought Process:  Goal Directed  Orientation:  Full (Time, Place, and Person)  Thought Content: Rumination   Suicidal Thoughts:  No  Homicidal Thoughts:  No  Memory:  Immediate;   Good Recent;   Good Remote;   Good  Judgement:  Good  Insight:  Fair  Psychomotor Activity:  Decreased  Concentration:  Concentration: Good and Attention Span: Good  Recall:  Good  Fund of Knowledge: Good  Language: Good  Akathisia:  No  Handed:  Right  AIMS (if indicated): not done  Assets:  Communication Skills Desire for Improvement Resilience Social Support Talents/Skills Vocational/Educational  ADL's:  Intact  Cognition: WNL  Sleep:  Fair   Screenings: GAD-7     [facility]  Total GAD-7 Score  18    PHQ2-9     Counselor from 09/27/2018 in [facility] [facility] Counselor from 10/13/2017 in [facility] Nutrition from 07/30/2014 in [facility]  PHQ-2 Total Score  6  6  6   (Pended)   0  PHQ-9 Total Score  21  16  21   (Pended)   -       Assessment and Plan:  This patient is a 33 year old female with a history of depression and anxiety.  She may now have the complicating factor of possible Addison's disease.  We will await the results.  In the meantime we will increase Prozac to 60 mg daily for depression, continue Xanax 1 mg twice daily for anxiety as needed, trazodone 50 mg at bedtime for sleep and Adderall 30 mg every morning for focus.  She will return to see me in 4 weeks  Diannia Rudereborah Shelaine Frie, MD 06/03/2019, 8:44 AM

## 2019-06-06 ENCOUNTER — Telehealth (HOSPITAL_COMMUNITY): Payer: Self-pay | Admitting: *Deleted

## 2019-06-06 DIAGNOSIS — Z20822 Contact with and (suspected) exposure to covid-19: Secondary | ICD-10-CM | POA: Insufficient documentation

## 2019-06-06 NOTE — Telephone Encounter (Signed)
Left message, please direct her concerns about insurance to sylvia or eric

## 2019-06-06 NOTE — Telephone Encounter (Signed)
DR ROSS PATIENT INSURANCE "[place]" HAS INFORMED THAT THEY WILL  NOT COVER TELE/VIRTUAL VISITS  HER PCP ALSO SUGGESTED THAT SHE MIGHT ASK ABOUT DECREASING HER ADDERALL DOSAGE? [place] HER [phone]

## 2019-06-06 NOTE — Telephone Encounter (Signed)
Spoke with Sandra Glover sent a e-mail to Sandra Glover  & cc 'd Sandra Glover about the insurance issue

## 2019-06-10 ENCOUNTER — Telehealth (HOSPITAL_COMMUNITY): Payer: Self-pay | Admitting: *Deleted

## 2019-06-10 NOTE — Telephone Encounter (Signed)
PER: JOYA YOUNG  I spoke with a representative Programmer, multimedia) from [employer] [phone]) and she advised me that [employer] has not updated their information to indicate they are not paying for virtual visits. She said initially the plan excluded tele consults and virtual visits but began allowing them after the Covid 19 crisis. She said that if the patient has an upcoming visit within the next week or so that they would be covered but she advises that the patients check with the insurance prior to any future visits to see if the status has changed. The reference # is [id number]    LVM  FOR PATIENT  TO BE INFORMED

## 2019-06-17 ENCOUNTER — Other Ambulatory Visit: Payer: Self-pay | Admitting: Nurse Practitioner

## 2019-06-17 DIAGNOSIS — R197 Diarrhea, unspecified: Secondary | ICD-10-CM

## 2019-06-17 DIAGNOSIS — K219 Gastro-esophageal reflux disease without esophagitis: Secondary | ICD-10-CM

## 2019-06-17 MED ORDER — OMEPRAZOLE 40 MG PO CPDR: 40.0000 mg | DELAYED_RELEASE_CAPSULE | Freq: Every day | ORAL | 3 refills | Status: DC

## 2019-06-17 MED ORDER — DICYCLOMINE HCL 10 MG PO CAPS: 10.0000 mg | ORAL_CAPSULE | Freq: Three times a day (TID) | ORAL | 0 refills | Status: DC

## 2019-07-01 ENCOUNTER — Other Ambulatory Visit: Payer: Self-pay

## 2019-07-01 ENCOUNTER — Ambulatory Visit (HOSPITAL_COMMUNITY): Payer: PRIVATE HEALTH INSURANCE | Admitting: Psychiatry

## 2019-07-05 ENCOUNTER — Encounter: Payer: Self-pay | Admitting: Gastroenterology

## 2019-07-16 ENCOUNTER — Emergency Department (HOSPITAL_COMMUNITY)
Admission: EM | Admit: 2019-07-16 | Discharge: 2019-07-16 | Disposition: A | Discharge status: Home / Self Care | Payer: PRIVATE HEALTH INSURANCE | Attending: Emergency Medicine | Admitting: Emergency Medicine

## 2019-07-16 ENCOUNTER — Encounter (HOSPITAL_COMMUNITY): Payer: Self-pay | Admitting: Emergency Medicine

## 2019-07-16 ENCOUNTER — Other Ambulatory Visit: Payer: Self-pay

## 2019-07-16 DIAGNOSIS — N390 Urinary tract infection, site not specified: Secondary | ICD-10-CM | POA: Diagnosis not present

## 2019-07-16 DIAGNOSIS — Z79899 Other long term (current) drug therapy: Secondary | ICD-10-CM | POA: Diagnosis not present

## 2019-07-16 DIAGNOSIS — R319 Hematuria, unspecified: Secondary | ICD-10-CM | POA: Diagnosis not present

## 2019-07-16 DIAGNOSIS — R300 Dysuria: Secondary | ICD-10-CM | POA: Diagnosis not present

## 2019-07-16 DIAGNOSIS — R350 Frequency of micturition: Secondary | ICD-10-CM | POA: Diagnosis present

## 2019-07-16 DIAGNOSIS — Z8632 Personal history of gestational diabetes: Secondary | ICD-10-CM | POA: Insufficient documentation

## 2019-07-16 LAB — URINALYSIS, ROUTINE W REFLEX MICROSCOPIC
Bacteria, UA: NONE SEEN
Bilirubin Urine: NEGATIVE
Glucose, UA: NEGATIVE mg/dL
Ketones, ur: NEGATIVE mg/dL
Nitrite: NEGATIVE
Protein, ur: NEGATIVE mg/dL
RBC / HPF: >50 RBC/hpf — ABNORMAL HIGH (ref 0–5)
Specific Gravity, Urine: 1.021 (ref 1.005–1.030)
WBC, UA: >50 WBC/hpf — ABNORMAL HIGH (ref 0–5)
pH: 6 (ref 5.0–8.0)

## 2019-07-16 LAB — CBC WITH DIFFERENTIAL/PLATELET
Abs Immature Granulocytes: 0.03 10*3/uL (ref 0.00–0.07)
Basophils Absolute: 0 10*3/uL (ref 0.0–0.1)
Basophils Relative: 0 %
Eosinophils Absolute: 0.2 10*3/uL (ref 0.0–0.5)
Eosinophils Relative: 2 %
HCT: 37.8 % (ref 36.0–46.0)
Hemoglobin: 12.7 g/dL (ref 12.0–15.0)
Immature Granulocytes: 0 %
Lymphocytes Relative: 25 %
Lymphs Abs: 2.2 10*3/uL (ref 0.7–4.0)
MCH: 31.5 pg (ref 26.0–34.0)
MCHC: 33.6 g/dL (ref 30.0–36.0)
MCV: 93.8 fL (ref 80.0–100.0)
Monocytes Absolute: 0.6 10*3/uL (ref 0.1–1.0)
Monocytes Relative: 6 %
Neutro Abs: 6.1 10*3/uL (ref 1.7–7.7)
Neutrophils Relative %: 67 %
Platelets: 281 10*3/uL (ref 150–400)
RBC: 4.03 MIL/uL (ref 3.87–5.11)
RDW: 12.1 % (ref 11.5–15.5)
WBC: 9.1 10*3/uL (ref 4.0–10.5)
nRBC: 0 % (ref 0.0–0.2)

## 2019-07-16 LAB — PREGNANCY, URINE: Preg Test, Ur: NEGATIVE

## 2019-07-16 LAB — BASIC METABOLIC PANEL
Anion gap: 11 (ref 5–15)
BUN: 12 mg/dL (ref 6–20)
CO2: 23 mmol/L (ref 22–32)
Calcium: 8.9 mg/dL (ref 8.9–10.3)
Chloride: 103 mmol/L (ref 98–111)
Creatinine, Ser: 0.9 mg/dL (ref 0.44–1.00)
GFR calc Af Amer: >60 mL/min (ref >60)
GFR calc non Af Amer: >60 mL/min (ref >60)
Glucose, Bld: 111 mg/dL — ABNORMAL HIGH (ref 70–99)
Potassium: 3.8 mmol/L (ref 3.5–5.1)
Sodium: 137 mmol/L (ref 135–145)

## 2019-07-16 MED ORDER — ACETAMINOPHEN 325 MG PO TABS
650.0000 mg | ORAL_TABLET | Freq: Once | ORAL | Status: AC
  Administered 2019-07-16: 650 mg via ORAL
  Filled 2019-07-16: qty 2

## 2019-07-16 MED ORDER — ONDANSETRON HCL 4 MG PO TABS: 4.0000 mg | ORAL_TABLET | Freq: Three times a day (TID) | ORAL | 0 refills | Status: DC | PRN

## 2019-07-16 MED ORDER — CEPHALEXIN 500 MG PO CAPS: 500.0000 mg | ORAL_CAPSULE | Freq: Three times a day (TID) | ORAL | 0 refills | Status: DC

## 2019-07-16 MED ORDER — CEPHALEXIN 500 MG PO CAPS
500.0000 mg | ORAL_CAPSULE | Freq: Once | ORAL | Status: AC
  Administered 2019-07-16: 03:00:00 500 mg via ORAL
  Filled 2019-07-16: qty 1

## 2019-07-16 NOTE — ED Provider Notes (Signed)
[facility]ERGENCY DEPARTMENT Provider Note   CSN: [id number] Arrival date & time: 07/16/19  0122  Time seen 3:00 AM  History   Chief Complaint Chief Complaint  Patient presents with  . Back Pain    HPI Sandra Glover is a 33 y.o. female.     HPI patient states August 7 she started having an uncomfortable feeling over her bladder and started having frequency without dysuria.  However over the weekend, August 8 and 9 she started getting dysuria, urgency, urinating small amounts, and then an hour prior to arrival she had blood in her urine.  She describes bilateral flank pain but states the right is worse than the left.  She denies any nausea, vomiting, or fever although she states she had chills over the weekends.  She states she has had UTIs in the past but never had it go into her flank area.  She denies being pregnant.  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  PCP Aniceto BossSettle, Paul C, MD   Past Medical History:  Diagnosis Date  . Anxiety   . Anxiety   . Depression   . Gestational diabetes mellitus, antepartum    gestational only  . History of anemia 2013  . History of depression    states stopped med. in 2013  . 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 Pertussis   . Postcoital bleeding 07/14/2015  . Sinus infection 01/25/2013   started antibiotic 01/24/2013 x 10 days; nasal congestion, clear drainage from nose  . Tonsillar and adenoid hypertrophy 01/2013  . Vaginal irritation 03/25/2014   Mild yeast but has history of yeast, will not treat til after first trimester,try yogurt    Patient Active Problem List   Diagnosis Date Noted  . Diarrhea 12/26/2018  . Abdominal pain 12/26/2018  . Nexplanon insertion 09/25/2017  . Hemorrhoids 09/19/2016  . Rectal bleeding 03/24/2016  . Constipation 03/24/2016  . Postcoital bleeding 07/14/2015  . History of preterm delivery 09/09/2014  . Postpartum UTI  09/09/2014  . Pertussis 07/30/2014  . History of gestational diabetes 07/24/2014  . 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 Depression with anxiety 03/03/2014    Past Surgical History:  Procedure Laterality Date  . CHOLECYSTECTOMY  04/23/2010   laparoscopic  . COLONOSCOPY WITH PROPOFOL N/A 05/17/2016   Procedure: COLONOSCOPY WITH PROPOFOL;  Surgeon: West BaliSandi L Fields, MD;  Location: [facility];  Service: Endoscopy;  Laterality: N/A;  1000  . HEMORRHOID BANDING N/A 05/17/2016   Procedure: HEMORRHOID BANDING;  Surgeon: West BaliSandi L Fields, MD;  Location: [facility];  Service: Endoscopy;  Laterality: N/A;  . HEMORRHOID SURGERY N/A 06/08/2016   Procedure: SIMPLE HEMORRHOIDECTOMY;  Surgeon: Ancil LinseyJason Evan Davis, MD;  Location: [facility];  Service: General;  Laterality: N/A;  . PARTIAL NEPHRECTOMY Right age 31 mos.  . TONSILLECTOMY AND ADENOIDECTOMY N/A 01/29/2013   Procedure: TONSILLECTOMY AND ADENOIDECTOMY;  Surgeon: Darletta MollSui W Teoh, MD;  Location: [facility];  Service: ENT;  Laterality: N/A;     OB History    Gravida  2   Para  2   Term  1   Preterm  1   AB      Living  2     SAB      TAB      Ectopic      Multiple      Live Births  2            Home Medications    Prior to Admission medications   Medication Sig Start Date End Date Taking? Authorizing Provider  ALPRAZolam Duanne Moron) 1 MG tablet Take 1 tablet (1 mg total) by mouth 2 (two) times daily as needed for anxiety. 06/03/19 06/02/20  Cloria Spring, MD  amphetamine-dextroamphetamine (ADDERALL) 30 MG tablet Take 1 tablet by mouth every morning. 06/03/19 06/02/20  Cloria Spring, MD  amphetamine-dextroamphetamine (ADDERALL) 30 MG tablet Take 1 tablet by mouth every morning. 06/03/19 06/02/20  Cloria Spring, MD  amphetamine-dextroamphetamine (ADDERALL) 30 MG tablet Take 1 tablet by mouth every morning. 06/03/19 06/02/20  Cloria Spring, MD  cephALEXin (KEFLEX) 500 MG capsule Take 1 capsule (500 mg total) by  mouth 3 (three) times daily. 07/16/19   Rolland Porter, MD  dicyclomine (BENTYL) 10 MG capsule Take 1 capsule (10 mg total) by mouth 4 (four) times daily -  before meals and at bedtime. 06/17/19   Carlis Stable, NP  etonogestrel (NEXPLANON) 68 MG IMPL implant 1 each by Subdermal route once.    [provider]  FLUoxetine (PROZAC) 20 MG capsule Take 1 capsule (20 mg total) by mouth daily. 06/03/19 06/02/20  Cloria Spring, MD  FLUoxetine (PROZAC) 40 MG capsule TAKE 1 CAPSULE(40 MG) BY MOUTH DAILY 06/03/19   Cloria Spring, MD  lubiprostone (AMITIZA) 24 MCG capsule Take 1 capsule (24 mcg total) by mouth 2 (two) times daily with a meal. 03/13/19   Carlis Stable, NP  omeprazole (PRILOSEC) 40 MG capsule Take 1 capsule (40 mg total) by mouth daily. 06/17/19   Carlis Stable, NP  ondansetron (ZOFRAN) 4 MG tablet Take 1 tablet (4 mg total) by mouth every 8 (eight) hours as needed for nausea or vomiting. 07/16/19   Rolland Porter, MD  traZODone (DESYREL) 50 MG tablet TAKE 1 TABLET(50 MG) BY MOUTH AT BEDTIME 06/03/19   Cloria Spring, MD    Family History Family History  Problem Relation Age of Onset  . COPD Mother   . Depression Mother   . Anxiety disorder Mother   . Hypertension Mother   . Irritable bowel syndrome Mother   . Arthritis Mother   . Skin cancer Mother   . Ovarian cancer Maternal Aunt   . Ovarian cancer Maternal Grandmother   . Hypertension Father   . GER disease Father   . Bipolar disorder Paternal Uncle   . Anxiety disorder Cousin   . Drug abuse Cousin   . Bipolar disorder Cousin   . Colon cancer Neg Hx     Social History Social History   Tobacco Use  . Smoking status: Never Smoker  . Smokeless tobacco: Never Used  . Tobacco comment: Never smoked  Substance Use Topics  . Alcohol use: Yes    Alcohol/week: 0.0 standard drinks    Comment: socially  . Drug use: No     Allergies   Lortab [hydrocodone-acetaminophen] and Morphine and related   Review of Systems Review of  Systems  All other systems reviewed and are negative.    Physical Exam Updated Vital Signs BP (!) 127/99 (BP Location: Right Arm)   Pulse 76   Temp 99.4 F (37.4 C) (Oral)   Resp 18   Ht 5\' 3"  (1.6 m)   Wt 81.6 kg   LMP 07/06/2019   SpO2 100%   BMI 31.89 kg/m   Physical Exam Vitals signs and nursing note reviewed.  Constitutional:      Appearance: Normal appearance.  HENT:     Head: Normocephalic and atraumatic.     Right Ear: External ear normal.     Left Ear: External ear normal.     Nose: Nose normal.     Mouth/Throat:     Mouth: Mucous membranes are dry.  Eyes:     Extraocular Movements: Extraocular movements intact.     Conjunctiva/sclera: Conjunctivae normal.     Pupils: Pupils are equal, round, and reactive to light.  Neck:     Musculoskeletal: Normal range of motion.  Cardiovascular:     Rate and Rhythm: Normal rate and regular rhythm.     Heart sounds: Normal heart sounds.  Pulmonary:     Effort: Pulmonary effort is normal. No respiratory distress.     Breath sounds: Normal breath sounds.  Abdominal:     General: Abdomen is flat. Bowel sounds are normal.     Palpations: Abdomen is soft.     Tenderness: There is abdominal tenderness in the suprapubic area. There is no guarding or rebound.     Comments: Patient has bilateral CVA tenderness but the right is worse than the left.  Musculoskeletal: Normal range of motion.        General: No deformity.  Skin:    General: Skin is warm and dry.     Capillary Refill: Capillary refill takes less than 2 seconds.     Findings: No erythema.  Neurological:     General: No focal deficit present.     Mental Status: She is alert and oriented to person, place, and time.     Cranial Nerves: No cranial nerve deficit.  Psychiatric:        Mood and Affect: Affect is flat.        Speech: Speech normal.        Behavior: Behavior normal.      ED Treatments / Results  Labs (all labs ordered are listed, but only  abnormal results are displayed) Results for orders placed or performed during the hospital encounter of 07/16/19  Urinalysis, Routine w reflex microscopic  Result Value Ref Range   Color, Urine YELLOW YELLOW   APPearance CLOUDY (A) CLEAR   Specific Gravity, Urine 1.021 1.005 - 1.030   pH 6.0 5.0 - 8.0   Glucose, UA NEGATIVE NEGATIVE mg/dL   Hgb urine dipstick MODERATE (A) NEGATIVE   Bilirubin Urine NEGATIVE NEGATIVE   Ketones, ur NEGATIVE NEGATIVE mg/dL   Protein, ur NEGATIVE NEGATIVE mg/dL   Nitrite NEGATIVE NEGATIVE   Leukocytes,Ua LARGE (A) NEGATIVE   RBC / HPF >50 (H) 0 - 5 RBC/hpf   WBC, UA >50 (H) 0 - 5 WBC/hpf   Bacteria, UA NONE SEEN NONE SEEN   Squamous Epithelial / LPF 0-5 0 - 5   WBC Clumps PRESENT    Mucus PRESENT   Pregnancy, urine  Result Value Ref Range   Preg Test, Ur NEGATIVE NEGATIVE  Basic metabolic panel  Result Value Ref Range   Sodium 137 135 - 145 mmol/L   Potassium 3.8 3.5 - 5.1 mmol/L   Chloride 103 98 - 111 mmol/L   CO2 23 22 - 32 mmol/L   Glucose, Bld 111 (H) 70 - 99 mg/dL   BUN 12 6 - 20 mg/dL   Creatinine, Ser 1.610.90 0.44 - 1.00 mg/dL   Calcium 8.9 8.9 - 09.610.3 mg/dL   GFR calc non Af Amer >60 >60 mL/min   GFR calc Af  Amer >60 >60 mL/min   Anion gap 11 5 - 15  CBC with Differential  Result Value Ref Range   WBC 9.1 4.0 - 10.5 K/uL   RBC 4.03 3.87 - 5.11 MIL/uL   Hemoglobin 12.7 12.0 - 15.0 g/dL   HCT 16.137.8 09.636.0 - 04.546.0 %   MCV 93.8 80.0 - 100.0 fL   MCH 31.5 26.0 - 34.0 pg   MCHC 33.6 30.0 - 36.0 g/dL   RDW 40.912.1 81.111.5 - 91.415.5 %   Platelets 281 150 - 400 K/uL   nRBC 0.0 0.0 - 0.2 %   Neutrophils Relative % 67 %   Neutro Abs 6.1 1.7 - 7.7 K/uL   Lymphocytes Relative 25 %   Lymphs Abs 2.2 0.7 - 4.0 K/uL   Monocytes Relative 6 %   Monocytes Absolute 0.6 0.1 - 1.0 K/uL   Eosinophils Relative 2 %   Eosinophils Absolute 0.2 0.0 - 0.5 K/uL   Basophils Relative 0 %   Basophils Absolute 0.0 0.0 - 0.1 K/uL   Immature Granulocytes 0 %   Abs  Immature Granulocytes 0.03 0.00 - 0.07 K/uL   Laboratory interpretation all normal except UTI    EKG None  Radiology No results found.  Procedures Procedures (including critical care time)  Medications Ordered in ED Medications  cephALEXin (KEFLEX) capsule 500 mg (500 mg Oral Given 07/16/19 0323)  acetaminophen (TYLENOL) tablet 650 mg (650 mg Oral Given 07/16/19 0323)     Initial Impression / Assessment and Plan / ED Course  I have reviewed the triage vital signs and the nursing notes.  Pertinent labs & imaging results that were available during my care of the patient were reviewed by me and considered in my medical decision making (see chart for details).       Patient was not having vomiting or documented fever although she did have a low-grade temp in the ED at 99.  We initially discussed doing oral antibiotics however she questioned about having the partial nephrectomy.  She does not know what her normal kidney function is.  When I review her chart in January her BUN was 7 and creatinine was 0.79.  That was checked tonight to make sure she is not now in renal insufficiency.  She was started on oral Keflex.  She took 500 mg of acetaminophen prior to coming to the ED, she was given an additional 650 mg.  Patient's creatinine function is normal, her white blood cell count is normal.  She was discharged home.  She should drink plenty of fluids.  Take the antibiotics until gone.  She should return if she has uncontrolled vomiting and is unable to take her medications or if she feels like she is getting dehydrated.  Final Clinical Impressions(s) / ED Diagnoses   Final diagnoses:  Urinary tract infection without hematuria, site unspecified    ED Discharge Orders         Ordered    cephALEXin (KEFLEX) 500 MG capsule  3 times daily     07/16/19 0443    ondansetron (ZOFRAN) 4 MG tablet  Every 8 hours PRN     07/16/19 0443        OTC ibuprofen and acetaminophen  Plan  discharge  Devoria AlbeIva Lamoine Fredricksen, MD, Concha PyoFACEP     Alfredo Collymore, MD 07/16/19 [phone]

## 2019-07-16 NOTE — ED Triage Notes (Addendum)
Pt C/O back pain and hematuria. Pt states on Friday she began to have burning with urination. Denies fever, N/V.

## 2019-07-16 NOTE — Discharge Instructions (Addendum)
Drink plenty of fluids.  Take the antibiotics until gone.  Use the Zofran for nausea or vomiting.  You can take ibuprofen 600 mg plus acetaminophen 650 mg every 6 hours as needed for pain or fever.  Return to the emergency department if you have uncontrolled vomiting and you are unable to take your antibiotics or you feel like you are getting dehydrated.

## 2019-07-18 LAB — URINE CULTURE
Culture: >=100000 — AB
Special Requests: NORMAL

## 2019-07-19 ENCOUNTER — Emergency Department (HOSPITAL_COMMUNITY)
Admission: EM | Admit: 2019-07-19 | Discharge: 2019-07-19 | Disposition: A | Discharge status: Home / Self Care | Payer: PRIVATE HEALTH INSURANCE | Attending: Emergency Medicine | Admitting: Emergency Medicine

## 2019-07-19 ENCOUNTER — Telehealth: Payer: Self-pay

## 2019-07-19 ENCOUNTER — Encounter (HOSPITAL_COMMUNITY): Payer: Self-pay | Admitting: Emergency Medicine

## 2019-07-19 ENCOUNTER — Emergency Department (HOSPITAL_COMMUNITY): Payer: PRIVATE HEALTH INSURANCE

## 2019-07-19 ENCOUNTER — Other Ambulatory Visit: Payer: Self-pay

## 2019-07-19 DIAGNOSIS — N3091 Cystitis, unspecified with hematuria: Secondary | ICD-10-CM | POA: Insufficient documentation

## 2019-07-19 DIAGNOSIS — R319 Hematuria, unspecified: Secondary | ICD-10-CM

## 2019-07-19 DIAGNOSIS — N390 Urinary tract infection, site not specified: Secondary | ICD-10-CM

## 2019-07-19 LAB — URINALYSIS, ROUTINE W REFLEX MICROSCOPIC
Bilirubin Urine: NEGATIVE
Glucose, UA: NEGATIVE mg/dL
Ketones, ur: NEGATIVE mg/dL
Leukocytes,Ua: NEGATIVE
Nitrite: NEGATIVE
Protein, ur: NEGATIVE mg/dL
Specific Gravity, Urine: 1.009 (ref 1.005–1.030)
pH: 7 (ref 5.0–8.0)

## 2019-07-19 LAB — BASIC METABOLIC PANEL
Anion gap: 9 (ref 5–15)
BUN: 11 mg/dL (ref 6–20)
CO2: 25 mmol/L (ref 22–32)
Calcium: 9.2 mg/dL (ref 8.9–10.3)
Chloride: 104 mmol/L (ref 98–111)
Creatinine, Ser: 0.86 mg/dL (ref 0.44–1.00)
GFR calc Af Amer: >60 mL/min (ref >60)
GFR calc non Af Amer: >60 mL/min (ref >60)
Glucose, Bld: 107 mg/dL — ABNORMAL HIGH (ref 70–99)
Potassium: 3.6 mmol/L (ref 3.5–5.1)
Sodium: 138 mmol/L (ref 135–145)

## 2019-07-19 LAB — CBC WITH DIFFERENTIAL/PLATELET
Abs Immature Granulocytes: 0.02 10*3/uL (ref 0.00–0.07)
Basophils Absolute: 0 10*3/uL (ref 0.0–0.1)
Basophils Relative: 0 %
Eosinophils Absolute: 0.2 10*3/uL (ref 0.0–0.5)
Eosinophils Relative: 2 %
HCT: 38.9 % (ref 36.0–46.0)
Hemoglobin: 13 g/dL (ref 12.0–15.0)
Immature Granulocytes: 0 %
Lymphocytes Relative: 26 %
Lymphs Abs: 2.4 10*3/uL (ref 0.7–4.0)
MCH: 30.7 pg (ref 26.0–34.0)
MCHC: 33.4 g/dL (ref 30.0–36.0)
MCV: 92 fL (ref 80.0–100.0)
Monocytes Absolute: 0.6 10*3/uL (ref 0.1–1.0)
Monocytes Relative: 6 %
Neutro Abs: 6 10*3/uL (ref 1.7–7.7)
Neutrophils Relative %: 66 %
Platelets: 313 10*3/uL (ref 150–400)
RBC: 4.23 MIL/uL (ref 3.87–5.11)
RDW: 12 % (ref 11.5–15.5)
WBC: 9.1 10*3/uL (ref 4.0–10.5)
nRBC: 0 % (ref 0.0–0.2)

## 2019-07-19 LAB — POC URINE PREG, ED: Preg Test, Ur: NEGATIVE

## 2019-07-19 MED ORDER — SODIUM CHLORIDE 0.9 % IV BOLUS
1000.0000 mL | Freq: Once | INTRAVENOUS | Status: AC
  Administered 2019-07-19: 1000 mL via INTRAVENOUS

## 2019-07-19 MED ORDER — ONDANSETRON HCL 4 MG/2ML IJ SOLN
4.0000 mg | Freq: Once | INTRAMUSCULAR | Status: AC
  Administered 2019-07-19: 17:00:00 4 mg via INTRAVENOUS
  Filled 2019-07-19: qty 2

## 2019-07-19 MED ORDER — FENTANYL CITRATE (PF) 100 MCG/2ML IJ SOLN
25.0000 ug | Freq: Once | INTRAMUSCULAR | Status: AC
  Administered 2019-07-19: 18:00:00 25 ug via INTRAVENOUS
  Filled 2019-07-19: qty 2

## 2019-07-19 NOTE — Telephone Encounter (Signed)
Post ED Visit - Positive Culture Follow-up: Unsuccessful Patient Follow-up  Culture assessed and recommendations reviewed by:  []  [name], Pharm.D. []  [name], Pharm.D., BCPS AQ-ID []  [name], Pharm.D., BCPS []  [name], Pharm.D., BCPS []  [name], [name], BCPS, AAHIVP []  [name], Pharm.D., BCPS, AAHIVP []  [name], PharmD []  [name], PharmD, BCPS Seth Bake Lippucci Pharm D Positive urineculture  []  Patient discharged without antimicrobial prescription and treatment is now indicated [x]  Organism is resistant to prescribed ED discharge antimicrobial []  Patient with positive blood cultures   Unable to contact patient after 3 attempts, letter will be sent to address on file  [name] 07/19/2019, 9:42 AM

## 2019-07-19 NOTE — Discharge Instructions (Signed)
Continue taking your antibiotics as directed until they are finished.  Its important to drink plenty of water and cranberry juice.  Follow-up with your primary provider or call the urology group listed in a few days if the blood in your urine has not cleared.

## 2019-07-19 NOTE — ED Triage Notes (Signed)
Dx with UTI recently, urinating a lot of blood and pain in lower back that is worse

## 2019-07-19 NOTE — ED Provider Notes (Signed)
[facility] EMERGENCY DEPARTMENT Provider Note   CSN: [id number] Arrival date & time: 07/19/19  1608     History   Chief Complaint Chief Complaint  Patient presents with   Urinary Tract Infection    HPI Sandra Glover is a 33 y.o. female.     HPI   Sandra Glover is a 33 y.o. female with hx of partial right nephrectomy, who returns to the Emergency Department with worsening bilateral mid to lower back pain for 3 days.  She was seen here on Monday for symptoms of burning with urination and increased frequency.  She was treated with Keflex and states her symptoms are worsening and she feels dehydrated.  She also complains of generalized malaise, nausea, low grade fever and continued pain with urination.  She has also noticed blood in her urine and on the tissue post void.  She had an E visit with her PCP today and called in a prescription for Macrobid, but she has not picked up the prescription yet.  She denies vomiting, fever, and diarrhea.  She continues to drinks plenty of fluids.      Past Medical History:  Diagnosis Date   Anxiety    Anxiety    Depression    Gestational diabetes mellitus, antepartum    gestational only   History of anemia 2013   History of depression    states stopped med. 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   Pertussis    Postcoital bleeding 07/14/2015   Sinus infection 01/25/2013   started antibiotic 01/24/2013 x 10 days; nasal congestion, clear drainage from nose   Tonsillar and adenoid hypertrophy 01/2013   Vaginal irritation 03/25/2014   Mild yeast but has history of yeast, will not treat til after first trimester,try yogurt    Patient Active Problem List   Diagnosis Date Noted   Diarrhea 12/26/2018   Abdominal pain 12/26/2018   Nexplanon insertion 09/25/2017   Hemorrhoids 09/19/2016   Rectal bleeding 03/24/2016   Constipation 03/24/2016   Postcoital bleeding  07/14/2015   History of preterm delivery 09/09/2014   Postpartum UTI 09/09/2014   Pertussis 07/30/2014   History of gestational diabetes 07/24/2014   Cystic fibrosis carrier, 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   Depression with anxiety 03/03/2014    Past Surgical History:  Procedure Laterality Date   CHOLECYSTECTOMY  04/23/2010   laparoscopic   COLONOSCOPY WITH PROPOFOL N/A 05/17/2016   Procedure: COLONOSCOPY WITH PROPOFOL;  Surgeon: West BaliSandi L Fields, MD;  Location: [facility];  Service: Endoscopy;  Laterality: N/A;  1000   HEMORRHOID BANDING N/A 05/17/2016   Procedure: HEMORRHOID BANDING;  Surgeon: West BaliSandi L Fields, MD;  Location: [facility];  Service: Endoscopy;  Laterality: N/A;   HEMORRHOID SURGERY N/A 06/08/2016   Procedure: SIMPLE HEMORRHOIDECTOMY;  Surgeon: Ancil LinseyJason Evan Davis, MD;  Location: [facility];  Service: General;  Laterality: N/A;   PARTIAL NEPHRECTOMY Right age 31 mos.   TONSILLECTOMY AND ADENOIDECTOMY N/A 01/29/2013   Procedure: TONSILLECTOMY AND ADENOIDECTOMY;  Surgeon: Darletta MollSui W Teoh, MD;  Location: [facility];  Service: ENT;  Laterality: N/A;     OB History    Gravida  2   Para  2   Term  1   Preterm  1   AB      Living  2     SAB      TAB      Ectopic  Multiple      Live Births  2            Home Medications    Prior to Admission medications   Medication Sig Start Date End Date Taking? Authorizing Provider  ALPRAZolam Prudy Feeler(XANAX) 1 MG tablet Take 1 tablet (1 mg total) by mouth 2 (two) times daily as needed for anxiety. 06/03/19 06/02/20  Myrlene Brokeross, Deborah R, MD  amphetamine-dextroamphetamine (ADDERALL) 30 MG tablet Take 1 tablet by mouth every morning. 06/03/19 06/02/20  Myrlene Brokeross, Deborah R, MD  amphetamine-dextroamphetamine (ADDERALL) 30 MG tablet Take 1 tablet by mouth every morning. 06/03/19 06/02/20  Myrlene Brokeross, Deborah R, MD  amphetamine-dextroamphetamine (ADDERALL) 30 MG tablet Take 1 tablet by mouth every morning. 06/03/19 06/02/20  Myrlene Brokeross, Deborah R,  MD  cephALEXin (KEFLEX) 500 MG capsule Take 1 capsule (500 mg total) by mouth 3 (three) times daily. 07/16/19   Devoria AlbeKnapp, Iva, MD  dicyclomine (BENTYL) 10 MG capsule Take 1 capsule (10 mg total) by mouth 4 (four) times daily -  before meals and at bedtime. 06/17/19   Anice PaganiniGill, Eric A, NP  etonogestrel (NEXPLANON) 68 MG IMPL implant 1 each by Subdermal route once.    [provider]  FLUoxetine (PROZAC) 20 MG capsule Take 1 capsule (20 mg total) by mouth daily. 06/03/19 06/02/20  Myrlene Brokeross, Deborah R, MD  FLUoxetine (PROZAC) 40 MG capsule TAKE 1 CAPSULE(40 MG) BY MOUTH DAILY 06/03/19   Myrlene Brokeross, Deborah R, MD  lubiprostone (AMITIZA) 24 MCG capsule Take 1 capsule (24 mcg total) by mouth 2 (two) times daily with a meal. 03/13/19   Anice PaganiniGill, Eric A, NP  omeprazole (PRILOSEC) 40 MG capsule Take 1 capsule (40 mg total) by mouth daily. 06/17/19   Anice PaganiniGill, Eric A, NP  ondansetron (ZOFRAN) 4 MG tablet Take 1 tablet (4 mg total) by mouth every 8 (eight) hours as needed for nausea or vomiting. 07/16/19   Devoria AlbeKnapp, Iva, MD  traZODone (DESYREL) 50 MG tablet TAKE 1 TABLET(50 MG) BY MOUTH AT BEDTIME 06/03/19   Myrlene Brokeross, Deborah R, MD    Family History Family History  Problem Relation Age of Onset   COPD Mother    Depression Mother    Anxiety disorder Mother    Hypertension Mother    Irritable bowel syndrome Mother    Arthritis Mother    Skin cancer Mother    Ovarian cancer Maternal Aunt    Ovarian cancer Maternal Grandmother    Hypertension Father    GER disease Father    Bipolar disorder Paternal Uncle    Anxiety disorder Cousin    Drug abuse Cousin    Bipolar disorder Cousin    Colon cancer Neg Hx     Social History Social History   Tobacco Use   Smoking status: Never Smoker   Smokeless tobacco: Never Used   Tobacco comment: Never smoked  Substance Use Topics   Alcohol use: Yes    Alcohol/week: 0.0 standard drinks    Comment: socially   Drug use: No     Allergies   Lortab  [hydrocodone-acetaminophen] and Morphine and related   Review of Systems Review of Systems  Constitutional: Negative for activity change, appetite change, chills and fever.  HENT: Negative for sore throat and trouble swallowing.   Respiratory: Negative for chest tightness and shortness of breath.   Cardiovascular: Negative for chest pain.  Gastrointestinal: Positive for nausea. Negative for abdominal distention, abdominal pain, diarrhea and vomiting.  Genitourinary: Positive for dysuria, flank pain, frequency, hematuria and urgency. Negative for  decreased urine volume and difficulty urinating.  Musculoskeletal: Positive for myalgias. Negative for back pain.  Skin: Negative for rash.  Neurological: Negative for dizziness, weakness and numbness.  Hematological: Negative for adenopathy.  Psychiatric/Behavioral: Negative for confusion.     Physical Exam Updated Vital Signs BP (!) 155/106 (BP Location: Right Arm)    Pulse (!) 110    Temp 98.4 F (36.9 C) (Oral)    Resp 18    Ht 5\' 3"  (1.6 m)    Wt 81.6 kg    LMP 07/06/2019    SpO2 99%    BMI 31.89 kg/m   Physical Exam Vitals signs and nursing note reviewed.  Constitutional:      Appearance: Normal appearance. She is not toxic-appearing.  HENT:     Mouth/Throat:     Mouth: Mucous membranes are dry.     Comments: Mucous membranes appear slightly dry Neck:     Musculoskeletal: Normal range of motion. No muscular tenderness.  Cardiovascular:     Rate and Rhythm: Regular rhythm. Tachycardia present.  Pulmonary:     Effort: Pulmonary effort is normal.     Breath sounds: Normal breath sounds.  Abdominal:     Palpations: Abdomen is soft.     Tenderness: There is right CVA tenderness and left CVA tenderness.     Comments: Mild bilateral CVA tenderness with right > left.    Musculoskeletal: Normal range of motion.  Lymphadenopathy:     Cervical: No cervical adenopathy.  Skin:    General: Skin is warm.     Capillary Refill: Capillary  refill takes less than 2 seconds.     Findings: No rash.  Neurological:     General: No focal deficit present.     Mental Status: She is alert.      ED Treatments / Results  Labs (all labs ordered are listed, but only abnormal results are displayed) Labs Reviewed  URINALYSIS, ROUTINE W REFLEX MICROSCOPIC - Abnormal; Notable for the following components:      Result Value   Hgb urine dipstick LARGE (*)    Bacteria, UA RARE (*)    All other components within normal limits  BASIC METABOLIC PANEL - Abnormal; Notable for the following components:   Glucose, Bld 107 (*)    All other components within normal limits  URINE CULTURE  CBC WITH DIFFERENTIAL/PLATELET  POC URINE PREG, ED    EKG None  Radiology Ct Renal Stone Study  Result Date: 07/19/2019 CLINICAL DATA:  Low back pain, UTI with hematuria EXAM: CT ABDOMEN AND PELVIS WITHOUT CONTRAST TECHNIQUE: Multidetector CT imaging of the abdomen and pelvis was performed following the standard protocol without IV contrast. COMPARISON:  None. FINDINGS: Lower chest: No acute abnormality. Minimal nodularity inferior right breast, possible nodular parenchyma. Hepatobiliary: No focal liver abnormality is seen. Status post cholecystectomy. No biliary dilatation. Pancreas: Unremarkable. No pancreatic ductal dilatation or surrounding inflammatory changes. Spleen: Normal in size without focal abnormality. Adrenals/Urinary Tract: Adrenal glands are normal. No hydronephrosis. No ureteral stone. Minimal stranding about the bladder. Stomach/Bowel: Stomach is within normal limits. Appendix appears normal. No evidence of bowel wall thickening, distention, or inflammatory changes. Vascular/Lymphatic: No significant vascular findings are present. No enlarged abdominal or pelvic lymph nodes. Reproductive: Uterus and bilateral adnexa are unremarkable. Other: Negative for free air or free fluid Musculoskeletal: No acute or significant osseous findings. IMPRESSION:  1. Negative for hydronephrosis or ureteral stone 2. Minimal stranding around the bladder, presumably due to history of cystitis 3. Minimal  nodularity inferior right breast, possibly due to nodular parenchyma. Recommend correlation with physical exam. If clinically warranted, further evaluation with dedicated breast imaging could be performed on a nonemergent basis. Electronically Signed   By: Jasmine PangKim  Fujinaga M.D.   On: 07/19/2019 17:40    Procedures Procedures (including critical care time)  Medications Ordered in ED Medications  sodium chloride 0.9 % bolus 1,000 mL (has no administration in time range)  ondansetron (ZOFRAN) injection 4 mg (has no administration in time range)     Initial Impression / Assessment and Plan / ED Course  I have reviewed the triage vital signs and the nursing notes.  Pertinent labs & imaging results that were available during my care of the patient were reviewed by me and considered in my medical decision making (see chart for details).        Pt with continued dysuria and bilateral flank pain.  Developed hematuria 2 days go.  No fever or vomiting.  Currently taking keflex.  Urine culture from 07/15/19 grew staph saprophyticus.  Work up today is reassuring and she is non-toxic appearing.  She reports feeling better after the IVF's and states she is ready for d/c.  I feel this is appropriate and have discussed continuation of her abx and f/u with her PCP or urology.  Strict return precautions also discussed.  Also discussed f/u with her GYN regarding incidental breast finding on CT.    Final Clinical Impressions(s) / ED Diagnoses   Final diagnoses:  Urinary tract infection with hematuria, site unspecified    ED Discharge Orders    None       Pauline Ausriplett, Tamee Battin, PA-C 07/19/19 2118    Samuel JesterMcManus, Kathleen, DO 07/24/19 1346

## 2019-07-19 NOTE — Progress Notes (Signed)
ED Antimicrobial Stewardship Positive Culture Follow Up   Sandra Glover is an 33 y.o. female who presented to [facility] on 07/16/2019 with a chief complaint of  Chief Complaint  Patient presents with  . Back Pain    Recent Results (from the past 720 hour(s))  Urine culture     Status: Abnormal   Collection Time: 07/16/19  3:12 AM   Specimen: Urine, Clean Catch  Result Value Ref Range Status   Specimen Description   Final    URINE, CLEAN CATCH Performed at [facility], [address], [place], [place] [address]    Special Requests   Final    Normal Performed at [facility], [address] [place], [place] [address]    Culture >=100,000 COLONIES/mL STAPHYLOCOCCUS SAPROPHYTICUS (A)  Final   Report Status 07/18/2019 FINAL  Final   Organism ID, Bacteria STAPHYLOCOCCUS SAPROPHYTICUS (A)  Final      Susceptibility   Staphylococcus saprophyticus - MIC*    CIPROFLOXACIN <=0.5 SENSITIVE Sensitive     GENTAMICIN <=0.5 SENSITIVE Sensitive     NITROFURANTOIN <=16 SENSITIVE Sensitive     OXACILLIN >=4 RESISTANT Resistant     TETRACYCLINE <=1 SENSITIVE Sensitive     VANCOMYCIN <=0.5 SENSITIVE Sensitive     TRIMETH/SULFA <=10 SENSITIVE Sensitive     CLINDAMYCIN <=0.25 SENSITIVE Sensitive     RIFAMPIN <=0.5 SENSITIVE Sensitive     Inducible Clindamycin NEGATIVE Sensitive     * >=100,000 COLONIES/mL STAPHYLOCOCCUS SAPROPHYTICUS    [x]  Treated with cephalexin, organism resistant to prescribed antimicrobial  New antibiotic prescription: Bactrim DS 1 tablet PO BID x 7 days   ED Provider: Franchot Heidelberg PA-C  Thank you,   Eddie Candle, PharmD PGY-1 Pharmacy Resident  07/19/2019, 9:25 AM Clinical Pharmacist Monday - Friday phone -  [phone] Saturday - Sunday phone - [phone]

## 2019-07-21 LAB — URINE CULTURE: Culture: NO GROWTH

## 2019-07-22 ENCOUNTER — Telehealth: Payer: Self-pay | Admitting: *Deleted

## 2019-07-22 NOTE — Telephone Encounter (Signed)
Patient states she was seen in the ER at [facility] on 8/14 and was found to have a breast nodule on CT.  She was advised to have a f/u physical exam in office and then f/u u/s if needed. Report reviewed and will get patient on schedule for next week.  Pt verbalized understanding.

## 2019-07-27 ENCOUNTER — Telehealth (HOSPITAL_BASED_OUTPATIENT_CLINIC_OR_DEPARTMENT_OTHER): Payer: Self-pay

## 2019-07-27 NOTE — Telephone Encounter (Signed)
Pt called after receiving letter.  Pt ID verified x 2.  Pt informed of dx and need to change treatment.  Pt informed current Probation officer she was reseen in ED and spoke to her PCP who changed her abx to Nitrofurantoin which is sensitive to the staphyloccus saprophyticus bacteria.  Rx for Bactrim DS not called in for pt.  Pt reported she is feeling much better.

## 2019-07-30 ENCOUNTER — Ambulatory Visit (INDEPENDENT_AMBULATORY_CARE_PROVIDER_SITE_OTHER): Payer: PRIVATE HEALTH INSURANCE | Admitting: Women's Health

## 2019-07-30 ENCOUNTER — Other Ambulatory Visit: Payer: Self-pay

## 2019-07-30 ENCOUNTER — Encounter: Payer: Self-pay | Admitting: Women's Health

## 2019-07-30 VITALS — BP 124/89 | HR 115 | Ht 63.0 in | Wt 187.5 lb

## 2019-07-30 DIAGNOSIS — N6310 Unspecified lump in the right breast, unspecified quadrant: Secondary | ICD-10-CM | POA: Diagnosis not present

## 2019-07-30 NOTE — Progress Notes (Signed)
   GYN VISIT Patient name: Sandra Glover MRN [id number]  Date of birth: May 26, 1986 Chief Complaint:   breast nodule (right breast)  History of Present Illness:   Sandra Glover is a 33 y.o. G85P1102 Caucasian female being seen today for f/u.   Was told to f/u w/ [place] d/t Rt breast abnormality seen on CT 07/19/19 in ED, went for low back pain. CT showed "minimal nodularity inferior right breast, possible nodular parenchyma". She hasn't felt any abnormalities in this area. No family h/o breast cancer. Her grandma passed away w/ ovarian/bladder/kidney cancer last year, and same month her mom was dx w/ thyroid cancer.   Patient's last menstrual period was 07/06/2019. The current method of family planning is Nexplanon.  Last pap 10/16/17. Results were:  normal Review of Systems:   Pertinent items are noted in HPI Denies fever/chills, dizziness, headaches, visual disturbances, fatigue, shortness of breath, chest pain, abdominal pain, vomiting, abnormal vaginal discharge/itching/odor/irritation, problems with periods, bowel movements, urination, or intercourse unless otherwise stated above.  Pertinent History Reviewed:  Reviewed past medical,surgical, social, obstetrical and family history.  Reviewed problem list, medications and allergies. Physical Assessment:   Vitals:   07/30/19 1550  BP: 124/89  Pulse: (!) 115  Weight: 187 lb 8 oz (85 kg)  Height: 5\' 3"  (1.6 m)  Body mass index is 33.21 kg/m.       Physical Examination:   General appearance: alert, well appearing, and in no distress  Mental status: alert, oriented to person, place, and time  Skin: warm & dry   Cardiovascular: normal heart rate noted  Respiratory: normal respiratory effort, no distress  Breasts - breasts appear normal, no suspicious masses, no skin or nipple changes or axillary nodes   Abdomen: soft, non-tender   Pelvic: examination not indicated  Extremities: no edema   No results found for this or any  previous visit (from the past 24 hour(s)).  Assessment & Plan:  1) Abnormality Rt breast on CT> w/ normal physical exam, discussed option of surveillance vs. mammo & u/s, pt wishes to proceed w/ mammo & u/s, orders placed, note routed to Tish to call and schedule and contact pt w/ date/time   Meds: No orders of the defined types were placed in this encounter.   Orders Placed This Encounter  Procedures  . MM DIAG BREAST TOMO BILATERAL  . US BREAST LTD UNI RIGHT INC AXILLA    Return in about 1 year (around 07/29/2020) for Pap & physical.  Roma Schanz CNM, [facility] 07/30/2019 4:38 PM

## 2019-08-02 ENCOUNTER — Other Ambulatory Visit: Payer: Self-pay | Admitting: Women's Health

## 2019-08-02 ENCOUNTER — Encounter: Payer: Self-pay | Admitting: *Deleted

## 2019-08-02 DIAGNOSIS — N6310 Unspecified lump in the right breast, unspecified quadrant: Secondary | ICD-10-CM

## 2019-08-06 ENCOUNTER — Ambulatory Visit (HOSPITAL_COMMUNITY): Payer: PRIVATE HEALTH INSURANCE

## 2019-08-06 ENCOUNTER — Inpatient Hospital Stay (HOSPITAL_COMMUNITY): Admission: RE | Admit: 2019-08-06 | Payer: PRIVATE HEALTH INSURANCE | Source: Ambulatory Visit

## 2019-09-03 ENCOUNTER — Ambulatory Visit (HOSPITAL_COMMUNITY): Payer: PRIVATE HEALTH INSURANCE

## 2019-09-03 ENCOUNTER — Ambulatory Visit (HOSPITAL_COMMUNITY)
Admission: RE | Admit: 2019-09-03 | Discharge: 2019-09-03 | Disposition: A | Discharge status: Home / Self Care | Payer: PRIVATE HEALTH INSURANCE | Source: Ambulatory Visit | Attending: Women's Health | Admitting: Women's Health

## 2019-09-03 ENCOUNTER — Other Ambulatory Visit: Payer: Self-pay

## 2019-09-03 ENCOUNTER — Encounter (HOSPITAL_COMMUNITY): Payer: Self-pay

## 2019-09-03 DIAGNOSIS — N6310 Unspecified lump in the right breast, unspecified quadrant: Secondary | ICD-10-CM

## 2019-09-11 ENCOUNTER — Encounter (HOSPITAL_COMMUNITY): Payer: Self-pay

## 2019-09-11 ENCOUNTER — Other Ambulatory Visit: Payer: Self-pay

## 2019-09-11 ENCOUNTER — Ambulatory Visit (HOSPITAL_COMMUNITY)
Admission: RE | Admit: 2019-09-11 | Discharge: 2019-09-11 | Disposition: A | Discharge status: Home / Self Care | Payer: PRIVATE HEALTH INSURANCE | Source: Ambulatory Visit | Attending: Women's Health | Admitting: Women's Health

## 2019-09-11 ENCOUNTER — Ambulatory Visit (HOSPITAL_COMMUNITY): Payer: PRIVATE HEALTH INSURANCE

## 2019-09-11 DIAGNOSIS — N6310 Unspecified lump in the right breast, unspecified quadrant: Secondary | ICD-10-CM | POA: Insufficient documentation

## 2019-09-18 ENCOUNTER — Other Ambulatory Visit (HOSPITAL_COMMUNITY): Payer: Self-pay | Admitting: Psychiatry

## 2019-09-19 ENCOUNTER — Ambulatory Visit: Payer: Self-pay | Admitting: Nurse Practitioner

## 2019-09-23 ENCOUNTER — Ambulatory Visit: Payer: PRIVATE HEALTH INSURANCE | Admitting: Nurse Practitioner

## 2019-09-23 ENCOUNTER — Encounter: Payer: Self-pay | Admitting: Nurse Practitioner

## 2019-09-23 ENCOUNTER — Telehealth: Payer: Self-pay | Admitting: Nurse Practitioner

## 2019-09-23 NOTE — Telephone Encounter (Signed)
Patient was a no show and letter sent  °

## 2019-09-23 NOTE — Telephone Encounter (Signed)
Call pt for appt 

## 2019-09-23 NOTE — Progress Notes (Deleted)
Referring Provider: Aniceto Boss, MD Primary Care Physician:  Drema Halon, FNP Primary GI:  Dr.   Bonnetta Barry chief complaint on file.   HPI:   Sandra Glover is a 33 y.o. female who presents for abdominal pain.  The patient was last seen in our office 03/12/2019 for rectal bleeding, slow transit constipation, lower abdominal pain.  Her last office visit was a virtual office visit due to COVID-19/coronavirus pandemic.  Noted grade 3 hemorrhoids.  Colonoscopy up-to-date 2017 with rectal bleeding due to nonbleeding external and internal hemorrhoids and not due for repeat until age 82.  Recommended surgical referral for hemorrhoid treatment otherwise topical creams (apothecary cream).  Brief episode of diarrhea and hematochezia that self resolved.  She was in the emergency department for this and tested heme negative.  Likely viral gastroenteritis.  Due to mildly elevated alkaline phosphatase at 143 a GGT was recommended.  This was not completed.  Last visit noted Linzess is "too much" for constipation and started taking Amitiza 24 mcg twice daily which worked well.  Denies further rectal bleeding, rare diarrhea with dietary changes.  Abdominal pain improved/essentially resolved.  Amitiza continues to work well.  No other GI complaints.  Recommend to continue Amitiza and follow-up in 6 months.  She followed up with our office regarding difficulty affording her medication which was deemed to be due to a high deductible health plan.  Recommended use of coupon/co-pay cards.  Called her office again in July with burping, gas, diarrhea every day for a few days.  Upper abdominal burning and bloating.  Primary care gave her an injection of Toradol but continues to be worse.  She is wondering if IBS can cause a stomach ulcer.  She went to the ER for this and was prescribed omeprazole and Bentyl.  Updated prescriptions for both of these.  No further communication since July 2020.  Today she states    Past Medical History:  Diagnosis Date  . Anxiety   . Anxiety   . Depression   . Gestational diabetes mellitus, antepartum    gestational only  . History of anemia 2013  . History of depression    states stopped med. in 2013  . 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 Pertussis   . Postcoital bleeding 07/14/2015  . Sinus infection 01/25/2013   started antibiotic 01/24/2013 x 10 days; nasal congestion, clear drainage from nose  . Tonsillar and adenoid hypertrophy 01/2013  . Vaginal irritation 03/25/2014   Mild yeast but has history of yeast, will not treat til after first trimester,try yogurt    Past Surgical History:  Procedure Laterality Date  . CHOLECYSTECTOMY  04/23/2010   laparoscopic  . COLONOSCOPY WITH PROPOFOL N/A 05/17/2016   Procedure: COLONOSCOPY WITH PROPOFOL;  Surgeon: West Bali, MD;  [facility];  Service: Endoscopy;  Laterality: N/A;  1000  . HEMORRHOID BANDING N/A 05/17/2016   Procedure: HEMORRHOID BANDING;  Surgeon: West Bali, MD;  Location: [facility];  Service: Endoscopy;  Laterality: N/A;  . HEMORRHOID SURGERY N/A 06/08/2016   Procedure: SIMPLE HEMORRHOIDECTOMY;  Surgeon: Ancil Linsey, MD;  Location: [facility];  Service: General;  Laterality: N/A;  . PARTIAL NEPHRECTOMY Right age 36 mos.  . TONSILLECTOMY AND ADENOIDECTOMY N/A 01/29/2013   Procedure: TONSILLECTOMY AND ADENOIDECTOMY;  Surgeon: Darletta Moll, MD;  Location: [facility];  Service: ENT;  Laterality: N/A;  Current Outpatient Medications  Medication Sig Dispense Refill  . ALPRAZolam (XANAX) 1 MG tablet Take 1 tablet (1 mg total) by mouth 2 (two) times daily as needed for anxiety. 60 tablet 2  . amphetamine-dextroamphetamine (ADDERALL) 30 MG tablet Take 1 tablet by mouth every morning. 30 tablet 0  . amphetamine-dextroamphetamine (ADDERALL) 30 MG tablet Take 1 tablet by mouth every morning. 30 tablet 0  .  amphetamine-dextroamphetamine (ADDERALL) 30 MG tablet Take 1 tablet by mouth every morning. 30 tablet 0  . dicyclomine (BENTYL) 10 MG capsule Take 1 capsule (10 mg total) by mouth 4 (four) times daily -  before meals and at bedtime. 90 capsule 0  . etonogestrel (NEXPLANON) 68 MG IMPL implant 1 each by Subdermal route once.    Marland Kitchen FLUoxetine (PROZAC) 20 MG capsule Take 1 capsule (20 mg total) by mouth daily. 30 capsule 2  . FLUoxetine (PROZAC) 40 MG capsule TAKE 1 CAPSULE(40 MG) BY MOUTH DAILY 30 capsule 2  . omeprazole (PRILOSEC) 40 MG capsule Take 1 capsule (40 mg total) by mouth daily. 90 capsule 3   No current facility-administered medications for this visit.     Allergies as of 09/23/2019 - Review Complete 07/30/2019  Allergen Reaction Noted  . Lortab [hydrocodone-acetaminophen] Nausea And Vomiting 01/29/2013  . Morphine and related Rash 01/25/2013    Family History  Problem Relation Age of Onset  . COPD Mother   . Depression Mother   . Anxiety disorder Mother   . Hypertension Mother   . Irritable bowel syndrome Mother   . Arthritis Mother   . Skin cancer Mother   . Thyroid cancer Mother   . Ovarian cancer Maternal Aunt   . Ovarian cancer Maternal Grandmother   . Cancer Maternal Grandmother        bladder, kidney  . Hypertension Father   . GER disease Father   . Bipolar disorder Paternal Uncle   . Anxiety disorder Cousin   . Drug abuse Cousin   . Bipolar disorder Cousin   . Colon cancer Neg Hx     Social History   Socioeconomic History  . Marital status: Married    Spouse name: Not on file  . Number of children: Not on file  . Years of education: Not on file  . Highest education level: Not on file  Occupational History  . Not on file  Social Needs  . Financial resource strain: Not on file  . Food insecurity    Worry: Not on file    Inability: Not on file  . Transportation needs    Medical: Not on file    Non-medical: Not on file  Tobacco Use  . Smoking  status: Never Smoker  . Smokeless tobacco: Never Used  . Tobacco comment: Never smoked  Substance and Sexual Activity  . Alcohol use: Yes    Alcohol/week: 0.0 standard drinks    Comment: socially  . Drug use: No  . Sexual activity: Yes    Birth control/protection: Implant    Comment: nexplanon implant  Lifestyle  . Physical activity    Days per week: Not on file    Minutes per session: Not on file  . Stress: Not on file  Relationships  . Social Herbalist on phone: Not on file    Gets together: Not on file    Attends religious service: Not on file    Active member of club or organization: Not on file    Attends meetings of  clubs or organizations: Not on file    Relationship status: Not on file  Other Topics Concern  . Not on file  Social History Narrative  . Not on file    Review of Systems: General: Negative for anorexia, weight loss, fever, chills, fatigue, weakness. Eyes: Negative for vision changes.  ENT: Negative for hoarseness, difficulty swallowing , nasal congestion. CV: Negative for chest pain, angina, palpitations, dyspnea on exertion, peripheral edema.  Respiratory: Negative for dyspnea at rest, dyspnea on exertion, cough, sputum, wheezing.  GI: See history of present illness. GU:  Negative for dysuria, hematuria, urinary incontinence, urinary frequency, nocturnal urination.  MS: Negative for joint pain, low back pain.  Derm: Negative for rash or itching.  Neuro: Negative for weakness, abnormal sensation, seizure, frequent headaches, memory loss, confusion.  Psych: Negative for anxiety, depression, suicidal ideation, hallucinations.  Endo: Negative for unusual weight change.  Heme: Negative for bruising or bleeding. Allergy: Negative for rash or hives.   Physical Exam: There were no vitals taken for this visit. General:   Alert and oriented. Pleasant and cooperative. Well-nourished and well-developed.  Head:  Normocephalic and atraumatic. Eyes:   Without icterus, sclera clear and conjunctiva pink.  Ears:  Normal auditory acuity. Mouth:  No deformity or lesions, oral mucosa pink.  Throat/Neck:  Supple, without mass or thyromegaly. Cardiovascular:  S1, S2 present without murmurs appreciated. Normal pulses noted. Extremities without clubbing or edema. Respiratory:  Clear to auscultation bilaterally. No wheezes, rales, or rhonchi. No distress.  Gastrointestinal:  +BS, soft, non-tender and non-distended. No HSM noted. No guarding or rebound. No masses appreciated.  Rectal:  Deferred  Musculoskalatal:  Symmetrical without gross deformities. Normal posture. Skin:  Intact without significant lesions or rashes. Neurologic:  Alert and oriented x4;  grossly normal neurologically. Psych:  Alert and cooperative. Normal mood and affect. Heme/Lymph/Immune: No significant cervical adenopathy. No excessive bruising noted.    09/23/2019 8:06 AM   Disclaimer: This note was dictated with voice recognition software. Similar sounding words can inadvertently be transcribed and may not be corrected upon review.

## 2019-09-23 NOTE — Telephone Encounter (Signed)
LVM: FLUoxetine (PROZAC) 40 MG capsule  REFILLED  PER PROVIDER : CALL FOR APPT

## 2019-09-24 ENCOUNTER — Other Ambulatory Visit (HOSPITAL_COMMUNITY): Payer: Self-pay | Admitting: Psychiatry

## 2019-09-24 ENCOUNTER — Telehealth (HOSPITAL_COMMUNITY): Payer: Self-pay | Admitting: *Deleted

## 2019-09-24 MED ORDER — AMPHETAMINE-DEXTROAMPHETAMINE 30 MG PO TABS: 30.0000 mg | ORAL_TABLET | ORAL | 0 refills | Status: DC

## 2019-09-24 NOTE — Telephone Encounter (Signed)
Patient LVM asking for refill for Adderall. Did LVM on 10/19 that patient needs appt & left appt options available.

## 2019-09-24 NOTE — Telephone Encounter (Signed)
Sent, but she still needs appt

## 2019-10-14 ENCOUNTER — Ambulatory Visit (HOSPITAL_COMMUNITY): Payer: Self-pay | Admitting: Psychiatry

## 2019-10-14 ENCOUNTER — Other Ambulatory Visit: Payer: Self-pay

## 2019-10-16 ENCOUNTER — Other Ambulatory Visit: Payer: Self-pay

## 2019-10-16 ENCOUNTER — Encounter (HOSPITAL_COMMUNITY): Payer: Self-pay | Admitting: Psychiatry

## 2019-10-16 ENCOUNTER — Ambulatory Visit (INDEPENDENT_AMBULATORY_CARE_PROVIDER_SITE_OTHER): Payer: PRIVATE HEALTH INSURANCE | Admitting: Psychiatry

## 2019-10-16 DIAGNOSIS — F900 Attention-deficit hyperactivity disorder, predominantly inattentive type: Secondary | ICD-10-CM | POA: Diagnosis not present

## 2019-10-16 DIAGNOSIS — F332 Major depressive disorder, recurrent severe without psychotic features: Secondary | ICD-10-CM

## 2019-10-16 MED ORDER — AMPHETAMINE-DEXTROAMPHETAMINE 30 MG PO TABS: 30.0000 mg | ORAL_TABLET | ORAL | 0 refills | Status: DC

## 2019-10-16 MED ORDER — FLUOXETINE HCL 20 MG PO CAPS: 20.0000 mg | ORAL_CAPSULE | Freq: Every day | ORAL | 2 refills | Status: DC

## 2019-10-16 MED ORDER — FLUOXETINE HCL 40 MG PO CAPS: ORAL_CAPSULE | ORAL | 2 refills | Status: DC

## 2019-10-16 MED ORDER — ALPRAZOLAM 1 MG PO TABS: 1.0000 mg | ORAL_TABLET | Freq: Two times a day (BID) | ORAL | 2 refills | Status: DC | PRN

## 2019-10-16 NOTE — Progress Notes (Signed)
Virtual Visit via Telephone Note  I connected with Sandra Glover on 10/16/19 at  8:20 AM EST by telephone and verified that I am speaking with the correct person using two identifiers.   I discussed the limitations, risks, security and privacy concerns of performing an evaluation and management service by telephone and the availability of in person appointments. I also discussed with the patient that there may be a patient responsible charge related to this service. The patient expressed understanding and agreed to proceed.:    I discussed the assessment and treatment plan with the patient. The patient was provided an opportunity to ask questions and all were answered. The patient agreed with the plan and demonstrated an understanding of the instructions.   The patient was advised to call back or seek an in-person evaluation if the symptoms worsen or if the condition fails to improve as anticipated.  I provided 70minutes of non-face-to-face time during this encounter.   Sandra Spiller, MD  [facility] MD/PA/NP OP Progress Note  10/16/2019 8:55 AM [name]  MRN:  [id number]  Chief Complaint:  Chief Complaint    Depression; Anxiety; ADD; Follow-up     HPI: This patient is a 33 year old married white female who lives with her husband and 2 children in [place].  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  The patient returns after about 5 months.  She has missed some appointments.  She is back working full-time and she enjoys her job.  She states that she is generally doing well.  She can had a couple of panic attacks over the summer but refrain from explaining to me why these happen although she claimed it was "due to family issues."  She states right now she and her husband and children are getting along well.  Her mother's cancer is currently in remission.  She recently lost a second cousin which has been difficult for her as she was close to this  family.  Overall however she feels that her medications have been helpful and she denies serious depression suicidal ideation difficulty with sleep.  She is not currently having panic attacks and her focus is good. Visit Diagnosis:    ICD-10-CM   1. Severe episode of recurrent major depressive disorder, without psychotic features ([place])  F33.2   2. Attention deficit hyperactivity disorder (ADHD), predominantly inattentive type  F90.0     Past Psychiatric History: Long-term outpatient treatment  Past Medical History:  Past Medical History:  Diagnosis Date  . Anxiety   . Anxiety   . Depression   . Gestational diabetes mellitus, antepartum    gestational only  . History of anemia 2013  . History of depression    states stopped med. in 2013  . History of partial nephrectomy age 57 mos.   right - due to kidney infection  . Pertussis   . Postcoital bleeding 07/14/2015  . Sinus infection 01/25/2013   started antibiotic 01/24/2013 x 10 days; nasal congestion, clear drainage from nose  . Tonsillar and adenoid hypertrophy 01/2013  . Vaginal irritation 03/25/2014   Mild yeast but has history of yeast, will not treat til after first trimester,try yogurt    Past Surgical History:  Procedure Laterality Date  . CHOLECYSTECTOMY  04/23/2010   laparoscopic  . COLONOSCOPY WITH PROPOFOL N/A 05/17/2016   Procedure: COLONOSCOPY WITH PROPOFOL;  Surgeon: Danie Binder, MD;  [facility];  Service: Endoscopy;  Laterality: N/A;  1000  .  HEMORRHOID BANDING N/A 05/17/2016   Procedure: HEMORRHOID BANDING;  Surgeon: West BaliSandi L Fields, MD;  [facility];  Service: Endoscopy;  Laterality: N/A;  . HEMORRHOID SURGERY N/A 06/08/2016   Procedure: SIMPLE HEMORRHOIDECTOMY;  Surgeon: Ancil LinseyJason Evan Davis, MD;  Location: [facility];  Service: General;  Laterality: N/A;  . PARTIAL NEPHRECTOMY Right age 33 mos. mos.  . TONSILLECTOMY AND ADENOIDECTOMY N/A 01/29/2013   Procedure: TONSILLECTOMY AND ADENOIDECTOMY;  Surgeon:  Darletta MollSui W Teoh, MD;  Location: [facility];  Service: ENT;  Laterality: N/A;    Family Psychiatric History: see below  Family History:  Family History  Problem Relation Age of Onset  . COPD Mother   . Depression Mother   . Anxiety disorder Mother   . Hypertension Mother   . Irritable bowel syndrome Mother   . Arthritis Mother   . Skin cancer Mother   . Thyroid cancer Mother   . Ovarian cancer Maternal Aunt   . Ovarian cancer Maternal Grandmother   . Cancer Maternal Grandmother        bladder, kidney  . Hypertension Father   . GER disease Father   . Bipolar disorder Paternal Uncle   . Anxiety disorder Cousin   . Drug abuse Cousin   . Bipolar disorder Cousin   . Colon cancer Neg Hx     Social History:  Social History   Socioeconomic History  . Marital status: Married    Spouse name: Not on file  . Number of children: Not on file  . Years of education: Not on file  . Highest education level: Not on file  Occupational History  . Not on file  Social Needs  . Financial resource strain: Not on file  . Food insecurity    Worry: Not on file    Inability: Not on file  . Transportation needs    Medical: Not on file    Non-medical: Not on file  Tobacco Use  . Smoking status: Never Smoker  . Smokeless tobacco: Never Used  . Tobacco comment: Never smoked  Substance and Sexual Activity  . Alcohol use: Yes    Alcohol/week: 0.0 standard drinks    Comment: socially  . Drug use: No  . Sexual activity: Yes    Birth control/protection: Implant    Comment: nexplanon implant  Lifestyle  . Physical activity    Days per week: Not on file    Minutes per session: Not on file  . Stress: Not on file  Relationships  . Social Musicianconnections    Talks on phone: Not on file    Gets together: Not on file    Attends religious service: Not on file    Active member of club or organization: Not on file    Attends meetings of clubs or organizations: Not on file    Relationship  status: Not on file  Other Topics Concern  . Not on file  Social History Narrative  . Not on file    Allergies:  Allergies  Allergen Reactions  . Lortab [Hydrocodone-Acetaminophen] Nausea And Vomiting  . Morphine And Related Rash    Metabolic Disorder Labs: No results found for: HGBA1C, MPG No results found for: PROLACTIN No results found for: CHOL, TRIG, HDL, CHOLHDL, VLDL, LDLCALC Lab Results  Component Value Date   TSH 2.553 10/13/2017    Therapeutic Level Labs: No results found for: LITHIUM No results found for: VALPROATE No components found for:  CBMZ  Current Medications: Current Outpatient Medications  Medication Sig Dispense Refill  . ALPRAZolam (XANAX) 1 MG tablet Take 1 tablet (1 mg total) by mouth 2 (two) times daily as needed for anxiety. 60 tablet 2  . amphetamine-dextroamphetamine (ADDERALL) 30 MG tablet Take 1 tablet by mouth every morning. 30 tablet 0  . amphetamine-dextroamphetamine (ADDERALL) 30 MG tablet Take 1 tablet by mouth every morning. 30 tablet 0  . amphetamine-dextroamphetamine (ADDERALL) 30 MG tablet Take 1 tablet by mouth every morning. 30 tablet 0  . dicyclomine (BENTYL) 10 MG capsule Take 1 capsule (10 mg total) by mouth 4 (four) times daily -  before meals and at bedtime. 90 capsule 0  . etonogestrel (NEXPLANON) 68 MG IMPL implant 1 each by Subdermal route once.    Marland Kitchen FLUoxetine (PROZAC) 20 MG capsule Take 1 capsule (20 mg total) by mouth daily. 30 capsule 2  . FLUoxetine (PROZAC) 40 MG capsule TAKE ONE CAPSULE BY MOUTH EVERY DAY 30 capsule 2  . meloxicam (MOBIC) 7.5 MG tablet TAKE 1 TABLET BY MOUTH DAILY FOR NECK PAIN AND HEADACHE. MAY INCREASE TO 2 TABLEST IF NOT EFFECTIVE    . omeprazole (PRILOSEC) 40 MG capsule Take 1 capsule (40 mg total) by mouth daily. 90 capsule 3   No current facility-administered medications for this visit.      Musculoskeletal: Strength & Muscle Tone: within normal limits Gait & Station: normal Patient leans:  N/A  Psychiatric Specialty Exam: Review of Systems  Musculoskeletal: Positive for neck pain.  All other systems reviewed and are negative.   There were no vitals taken for this visit.There is no height or weight on file to calculate BMI.  General Appearance: NA  Eye Contact:  NA  Speech:  Clear and Coherent  Volume:  Normal  Mood:  Euthymic  Affect:  NA  Thought Process:  Goal Directed  Orientation:  Full (Time, Place, and Person)  Thought Content: Rumination   Suicidal Thoughts:  No  Homicidal Thoughts:  No  Memory:  Immediate;   Good Recent;   Good Remote;   Fair  Judgement:  Good  Insight:  Good  Psychomotor Activity:  Normal  Concentration:  Concentration: Good and Attention Span: Good  Recall:  Good  Fund of Knowledge: Good  Language: Good  Akathisia:  No  Handed:  Right  AIMS (if indicated): not done  Assets:  Communication Skills Desire for Improvement Physical Health Resilience Social Support Talents/Skills  ADL's:  Intact  Cognition: WNL  Sleep:  Good   Screenings: GAD-7     Counselor from 09/27/2018 [facility]  Total GAD-7 Score  18    PHQ2-9     Counselor from 09/27/2018 in [facility] Office Visit from 10/16/2017 in [facility] Counselor from 10/13/2017 in [facility] Nutrition from 07/30/2014 in [facility]  PHQ-2 Total Score  6  6  6   (Pended)   0  PHQ-9 Total Score  21  16  21   (Pended)   -       Assessment and Plan: This patient is a 33 year old female with a history of depression anxiety and ADD.  She continues to do well on her current regimen.  She will continue Prozac 60 mg daily for depression, Xanax 1 mg twice daily for anxiety,  and Adderall 30 mg in the morning for focus.  She will return to see me in 3 months   , MD 10/16/2019, 8:55 AM

## 2019-12-01 DIAGNOSIS — Z905 Acquired absence of kidney: Secondary | ICD-10-CM | POA: Insufficient documentation

## 2019-12-19 DIAGNOSIS — R519 Headache, unspecified: Secondary | ICD-10-CM | POA: Insufficient documentation

## 2020-01-14 ENCOUNTER — Other Ambulatory Visit (HOSPITAL_COMMUNITY): Payer: Self-pay | Admitting: Psychiatry

## 2020-01-14 MED ORDER — FLUOXETINE HCL 40 MG PO CAPS: ORAL_CAPSULE | ORAL | 2 refills | Status: DC

## 2020-01-17 ENCOUNTER — Ambulatory Visit (INDEPENDENT_AMBULATORY_CARE_PROVIDER_SITE_OTHER): Payer: PRIVATE HEALTH INSURANCE | Admitting: Psychiatry

## 2020-01-17 ENCOUNTER — Encounter (HOSPITAL_COMMUNITY): Payer: Self-pay | Admitting: Psychiatry

## 2020-01-17 ENCOUNTER — Other Ambulatory Visit: Payer: Self-pay

## 2020-01-17 DIAGNOSIS — F900 Attention-deficit hyperactivity disorder, predominantly inattentive type: Secondary | ICD-10-CM | POA: Diagnosis not present

## 2020-01-17 DIAGNOSIS — F332 Major depressive disorder, recurrent severe without psychotic features: Secondary | ICD-10-CM

## 2020-01-17 MED ORDER — AMPHETAMINE-DEXTROAMPHETAMINE 30 MG PO TABS: 30.0000 mg | ORAL_TABLET | ORAL | 0 refills | Status: DC

## 2020-01-17 MED ORDER — FLUOXETINE HCL 20 MG PO CAPS: 20.0000 mg | ORAL_CAPSULE | Freq: Every day | ORAL | 2 refills | Status: DC

## 2020-01-17 NOTE — Progress Notes (Signed)
Virtual Visit via Video Note  I connected with Sandra Glover on 01/17/20 at  8:40 AM EST by a video enabled telemedicine application and verified that I am speaking with the correct person using two identifiers.   I discussed the limitations of evaluation and management by telemedicine and the availability of in person appointments. The patient expressed understanding and agreed to proceed.    I discussed the assessment and treatment plan with the patient. The patient was provided an opportunity to ask questions and all were answered. The patient agreed with the plan and demonstrated an understanding of the instructions.   The patient was advised to call back or seek an in-person evaluation if the symptoms worsen or if the condition fails to improve as anticipated.  I provided 15 minutes of non-face-to-face time during this encounter.   [name], MD  [facility] MD/PA/NP OP Progress Note  01/17/2020 11:24 AM Sandra Glover  MRN:  [id number]  Chief Complaint:  Chief Complaint    Anxiety; Depression; Follow-up     HPI: This patient is a 34 year old married white female who lives with her husband and 2 children in [place].  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  The patient returns after 3 months.  She states overall she is doing very well.  She and her husband are getting along "great."  Her children are both in a daycare program and doing well.  She states that her whole family got coronavirus in December but they got through it without severe incident.  She is actually even gotten her first vaccine since then.  Her mood has been stable and she has not had to use the Xanax except for one time.  She is sleeping well at night.  She denies significant depression or suicidal ideation.  Her focus is good at work with the Adderall Visit Diagnosis:    ICD-10-CM   1. Severe episode of recurrent major depressive disorder, without psychotic features (HCC)  F33.2    2. Attention deficit hyperactivity disorder (ADHD), predominantly inattentive type  F90.0     Past Psychiatric History: Long-term outpatient treatment  Past Medical History:  Past Medical History:  Diagnosis Date  . Anxiety   . Anxiety   . Depression   . Gestational diabetes mellitus, antepartum    gestational only  . History of anemia 2013  . History of depression    states stopped med. in 2013  . 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 Pertussis   . Postcoital bleeding 07/14/2015  . Sinus infection 01/25/2013   started antibiotic 01/24/2013 x 10 days; nasal congestion, clear drainage from nose  . Tonsillar and adenoid hypertrophy 01/2013  . Vaginal irritation 03/25/2014   Mild yeast but has history of yeast, will not treat til after first trimester,try yogurt    Past Surgical History:  Procedure Laterality Date  . CHOLECYSTECTOMY  04/23/2010   laparoscopic  . COLONOSCOPY WITH PROPOFOL N/A 05/17/2016   Procedure: COLONOSCOPY WITH PROPOFOL;  Surgeon: West Bali, MD;  [facility];  Service: Endoscopy;  Laterality: N/A;  1000  . HEMORRHOID BANDING N/A 05/17/2016   Procedure: HEMORRHOID BANDING;  Surgeon: West Bali, MD;  [facility];  Service: Endoscopy;  Laterality: N/A;  . HEMORRHOID SURGERY N/A 06/08/2016   Procedure: SIMPLE HEMORRHOIDECTOMY;  Surgeon: Ancil Linsey, MD;  Location: [facility];  Service: General;  Laterality:  N/A;  . PARTIAL NEPHRECTOMY Right age 73 mos.  . TONSILLECTOMY AND ADENOIDECTOMY N/A 01/29/2013   Procedure: TONSILLECTOMY AND ADENOIDECTOMY;  Surgeon: Darletta Moll, MD;  Location: [facility];  Service: ENT;  Laterality: N/A;    Family Psychiatric History: See below  Family History:  Family History  Problem Relation Age of Onset  . COPD Mother   . Depression Mother   . Anxiety disorder Mother   . Hypertension Mother   . Irritable bowel syndrome Mother   . Arthritis Mother    . Skin cancer Mother   . Thyroid cancer Mother   . Ovarian cancer Maternal Aunt   . Ovarian cancer Maternal Grandmother   . Cancer Maternal Grandmother        bladder, kidney  . Hypertension Father   . GER disease Father   . Bipolar disorder Paternal Uncle   . Anxiety disorder Cousin   . Drug abuse Cousin   . Bipolar disorder Cousin   . Colon cancer Neg Hx     Social History:  Social History   Socioeconomic History  . Marital status: Married    Spouse name: Not on file  . Number of children: Not on file  . Years of education: Not on file  . Highest education level: Not on file  Occupational History  . Not on file  Tobacco Use  . Smoking status: Never Smoker  . Smokeless tobacco: Never Used  . Tobacco comment: Never smoked  Substance and Sexual Activity  . Alcohol use: Yes    Alcohol/week: 0.0 standard drinks    Comment: socially  . Drug use: No  . Sexual activity: Yes    Birth control/protection: Implant    Comment: nexplanon implant  Other Topics Concern  . Not on file  Social History Narrative  . Not on file   Social Determinants of Health   Financial Resource Strain:   . Difficulty of Paying Living Expenses: Not on file  Food Insecurity:   . Worried About Programme researcher, broadcasting/film/video in the Last Year: Not on file  . Ran Out of Food in the Last Year: Not on file  Transportation Needs:   . Lack of Transportation (Medical): Not on file  . Lack of Transportation (Non-Medical): Not on file  Physical Activity:   . Days of Exercise per Week: Not on file  . Minutes of Exercise per Session: Not on file  Stress:   . Feeling of Stress : Not on file  Social Connections:   . Frequency of Communication with Friends and Family: Not on file  . Frequency of Social Gatherings with Friends and Family: Not on file  . Attends Religious Services: Not on file  . Active Member of Clubs or Organizations: Not on file  . Attends Banker Meetings: Not on file  . Marital  Status: Not on file    Allergies:  Allergies  Allergen Reactions  . Lortab [Hydrocodone-Acetaminophen] Nausea And Vomiting  . Morphine And Related Rash    Metabolic Disorder Labs: No results found for: HGBA1C, MPG No results found for: PROLACTIN No results found for: CHOL, TRIG, HDL, CHOLHDL, VLDL, LDLCALC Lab Results  Component Value Date   TSH 2.553 10/13/2017    Therapeutic Level Labs: No results found for: LITHIUM No results found for: VALPROATE No components found for:  CBMZ  Current Medications: Current Outpatient Medications  Medication Sig Dispense Refill  . ALPRAZolam (XANAX) 1 MG tablet Take 1 tablet (1 mg  total) by mouth 2 (two) times daily as needed for anxiety. 60 tablet 2  . amphetamine-dextroamphetamine (ADDERALL) 30 MG tablet Take 1 tablet by mouth every morning. 30 tablet 0  . amphetamine-dextroamphetamine (ADDERALL) 30 MG tablet Take 1 tablet by mouth every morning. 30 tablet 0  . amphetamine-dextroamphetamine (ADDERALL) 30 MG tablet Take 1 tablet by mouth every morning. 30 tablet 0  . dicyclomine (BENTYL) 10 MG capsule Take 1 capsule (10 mg total) by mouth 4 (four) times daily -  before meals and at bedtime. 90 capsule 0  . etonogestrel (NEXPLANON) 68 MG IMPL implant 1 each by Subdermal route once.    Marland Kitchen FLUoxetine (PROZAC) 20 MG capsule Take 1 capsule (20 mg total) by mouth daily. 30 capsule 2  . FLUoxetine (PROZAC) 40 MG capsule TAKE ONE CAPSULE BY MOUTH EVERY DAY 30 capsule 2  . meloxicam (MOBIC) 7.5 MG tablet TAKE 1 TABLET BY MOUTH DAILY FOR NECK PAIN AND HEADACHE. MAY INCREASE TO 2 TABLEST IF NOT EFFECTIVE    . omeprazole (PRILOSEC) 40 MG capsule Take 1 capsule (40 mg total) by mouth daily. 90 capsule 3   No current facility-administered medications for this visit.     Musculoskeletal: Strength & Muscle Tone: within normal limits Gait & Station: normal Patient leans: N/A  Psychiatric Specialty Exam: Review of Systems  All other systems reviewed  and are negative.   There were no vitals taken for this visit.There is no height or weight on file to calculate BMI.  General Appearance: Casual and Fairly Groomed  Eye Contact:  Good  Speech:  Clear and Coherent  Volume:  Normal  Mood:  Euthymic  Affect:  Appropriate and Congruent  Thought Process:  Goal Directed  Orientation:  Full (Time, Place, and Person)  Thought Content: Rumination   Suicidal Thoughts:  No  Homicidal Thoughts:  No  Memory:  Immediate;   Good Recent;   Good Remote;   Fair  Judgement:  Good  Insight:  Good  Psychomotor Activity:  Normal  Concentration:  Concentration: Good and Attention Span: Good  Recall:  Good  Fund of Knowledge: Good  Language: Good  Akathisia:  No  Handed:  Right  AIMS (if indicated): not done  Assets:  Communication Skills Desire for Improvement Physical Health Resilience Social Support Talents/Skills  ADL's:  Intact  Cognition: WNL  Sleep:  Good   Screenings: GAD-7     Counselor from 09/27/2018 in [place] [facility]  Total GAD-7 Score  18    PHQ2-9     Counselor from 09/27/2018 in [place] Office Visit from 10/16/2017 in [facility] Counselor from 10/13/2017 in [place] [facility]- Nutrition from 07/30/2014 in [facility]  PHQ-2 Total Score  6  6  6   (Pended)   0  PHQ-9 Total Score  21  16  21   (Pended)   --       Assessment and Plan: This patient is a 34 year old female with a history of depression anxiety and ADD.  She continues to do well on her current regimen.  She will continue Prozac 60 mg daily for depression and Adderall 30 mg in the morning for focus.  She is rarely using the Xanax but still has it available to use 1 mg daily for anxiety.  She will return to see me in 3 months   Levonne Spiller, MD 01/17/2020, 11:24 AM

## 2020-02-04 ENCOUNTER — Ambulatory Visit (INDEPENDENT_AMBULATORY_CARE_PROVIDER_SITE_OTHER): Payer: PRIVATE HEALTH INSURANCE | Admitting: Adult Health

## 2020-02-04 ENCOUNTER — Encounter: Payer: Self-pay | Admitting: Adult Health

## 2020-02-04 ENCOUNTER — Other Ambulatory Visit: Payer: Self-pay

## 2020-02-04 VITALS — BP 136/97 | HR 111 | Ht 63.0 in | Wt 186.0 lb

## 2020-02-04 DIAGNOSIS — Z3202 Encounter for pregnancy test, result negative: Secondary | ICD-10-CM | POA: Diagnosis not present

## 2020-02-04 DIAGNOSIS — N946 Dysmenorrhea, unspecified: Secondary | ICD-10-CM | POA: Diagnosis not present

## 2020-02-04 DIAGNOSIS — Z975 Presence of (intrauterine) contraceptive device: Secondary | ICD-10-CM | POA: Diagnosis not present

## 2020-02-04 DIAGNOSIS — N939 Abnormal uterine and vaginal bleeding, unspecified: Secondary | ICD-10-CM | POA: Diagnosis not present

## 2020-02-04 DIAGNOSIS — Z30017 Encounter for initial prescription of implantable subdermal contraceptive: Secondary | ICD-10-CM

## 2020-02-04 LAB — POCT HEMOGLOBIN: Hemoglobin: 13.1 g/dL (ref 11–14.6)

## 2020-02-04 LAB — POCT URINE PREGNANCY: Preg Test, Ur: NEGATIVE

## 2020-02-04 MED ORDER — MEGESTROL ACETATE 40 MG PO TABS: ORAL_TABLET | ORAL | 1 refills | Status: DC

## 2020-02-04 NOTE — Progress Notes (Signed)
  Subjective:     Patient ID: Sandra Glover, female   DOB: Feb 09, 1986, 34 y.o.   MRN: [id number]  HPI Sandra Glover is a 34 year old white female, married, [id number], has nexplanon in complaining of bleeding since February 10 and has had clots and cramps, it is lighter now. PCP is Aida Puffer NP.   Review of Systems Got nexplanon in 09/25/17, has had some irregular bleeding Last period started 2/10 and still spotting, has been heavy with clots and cramps, has been wearing pads.   Feels tired at times Denies discharge or odor No pain with sex Husband to get vasectomy before October  Reviewed past medical,surgical, social and family history. Reviewed medications and allergies.      Objective:   Physical Exam BP (!) 136/97 (BP Location: Left Arm, Patient Position: Sitting, Cuff Size: Normal)   Pulse (!) 111   Ht 5\' 3"  (1.6 m)   Wt 186 lb (84.4 kg)   LMP 01/15/2020   BMI 32.95 kg/m  UPT is negative   HGB 13.1. Fall risk is low Skin warm and dry. Neck: mid line trachea, normal thyroid, good ROM, no lymphadenopathy noted. Lungs: clear to ausculation bilaterally. Cardiovascular: regular rate and rhythm.Pelvic: external genitalia is normal in appearance no lesions, vagina: no blood or discharge today and no odor,urethra has no lesions or masses noted, cervix:smooth and bulbous, uterus: normal size, shape and contour, non tender, no masses felt, adnexa: no masses or tenderness noted. Bladder is non tender and no masses felt.  Co exam with 03/14/2020 NP student.   Assessment:     1. Pregnancy examination or test, negative result  2. Abnormal vaginal bleeding Will rx megace Meds ordered this encounter  Medications  . megestrol (MEGACE) 40 MG tablet    Sig: Take 3 x 5 days then 2 x 5 days then 1 daily    Dispense:  45 tablet    Refill:  1    Order Specific Question:   Supervising Provider    Answer:   Richelle Ito, LUTHER H [[id number]]    3. Nexplanon in place  4. Menstrual cramps     Plan:     She will call when wants nexplanon removed Return in 9 months for pap and physical

## 2020-04-04 DIAGNOSIS — M8580 Other specified disorders of bone density and structure, unspecified site: Secondary | ICD-10-CM | POA: Insufficient documentation

## 2020-04-08 ENCOUNTER — Ambulatory Visit (HOSPITAL_COMMUNITY): Payer: PRIVATE HEALTH INSURANCE | Admitting: Psychiatry

## 2020-04-27 ENCOUNTER — Other Ambulatory Visit (HOSPITAL_COMMUNITY): Payer: Self-pay | Admitting: Psychiatry

## 2020-04-27 MED ORDER — FLUOXETINE HCL 40 MG PO CAPS: ORAL_CAPSULE | ORAL | 2 refills | Status: DC

## 2020-05-05 ENCOUNTER — Other Ambulatory Visit: Payer: Self-pay

## 2020-05-05 ENCOUNTER — Encounter (HOSPITAL_COMMUNITY): Payer: Self-pay | Admitting: Psychiatry

## 2020-05-05 ENCOUNTER — Telehealth (INDEPENDENT_AMBULATORY_CARE_PROVIDER_SITE_OTHER): Payer: PRIVATE HEALTH INSURANCE | Admitting: Psychiatry

## 2020-05-05 DIAGNOSIS — F332 Major depressive disorder, recurrent severe without psychotic features: Secondary | ICD-10-CM

## 2020-05-05 DIAGNOSIS — F900 Attention-deficit hyperactivity disorder, predominantly inattentive type: Secondary | ICD-10-CM

## 2020-05-05 DIAGNOSIS — I10 Essential (primary) hypertension: Secondary | ICD-10-CM | POA: Insufficient documentation

## 2020-05-05 MED ORDER — AMPHETAMINE-DEXTROAMPHETAMINE 30 MG PO TABS: 30.0000 mg | ORAL_TABLET | ORAL | 0 refills | Status: DC

## 2020-05-05 MED ORDER — FLUOXETINE HCL 40 MG PO CAPS: ORAL_CAPSULE | ORAL | 2 refills | Status: DC

## 2020-05-05 MED ORDER — ALPRAZOLAM 1 MG PO TABS: 1.0000 mg | ORAL_TABLET | Freq: Two times a day (BID) | ORAL | 2 refills | Status: DC | PRN

## 2020-05-05 NOTE — Progress Notes (Signed)
Virtual Visit via Video Note  I connected with Sandra Glover on 05/05/20 at 11:00 AM EDT by a video enabled telemedicine application and verified that I am speaking with the correct person using two identifiers.   I discussed the limitations of evaluation and management by telemedicine and the availability of in person appointments. The patient expressed understanding and agreed to proceed    I discussed the assessment and treatment plan with the patient. The patient was provided an opportunity to ask questions and all were answered. The patient agreed with the plan and demonstrated an understanding of the instructions.   The patient was advised to call back or seek an in-person evaluation if the symptoms worsen or if the condition fails to improve as anticipated.  I provided 15 minutes of non-face-to-face time during this encounter. Location: Provider office, patient home  [name], MD  [facility] MD/PA/NP OP Progress Note  05/05/2020 11:09 AM Sandra Glover  MRN:  [id number]  Chief Complaint:  Chief Complaint    ADHD; Anxiety; Depression; Follow-up     HPI: This patient is a 34 year old married white female who lives with her husband and 2 children in [place].  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  The patient returns for follow-up after 3 months.  She states that she continues to do well.  Both her children are going to be in a church daycare this summer and they have done well in school.  She is very busy at work and she likes the job.  Her mood has been good and she denies any significant depression or thoughts of suicidal ideation or severe anxiety or panic.  She is sleeping well.  Her focus at work is good with the Adderall Visit Diagnosis:    ICD-10-CM   1. Severe episode of recurrent major depressive disorder, without psychotic features (HCC)  F33.2   2. Attention deficit hyperactivity disorder (ADHD), predominantly inattentive type  F90.0      Past Psychiatric History: Long-term outpatient treatment  Past Medical History:  Past Medical History:  Diagnosis Date  . Anxiety   . Anxiety   . Depression   . Gestational diabetes mellitus, antepartum    gestational only  . History of anemia 2013  . History of depression    states stopped med. in 2013  . History of partial nephrectomy age 62 mos.   right - due to kidney infection  . Pertussis   . Postcoital bleeding 07/14/2015  . Sinus infection 01/25/2013   started antibiotic 01/24/2013 x 10 days; nasal congestion, clear drainage from nose  . Tonsillar and adenoid hypertrophy 01/2013  . Vaginal irritation 03/25/2014   Mild yeast but has history of yeast, will not treat til after first trimester,try yogurt    Past Surgical History:  Procedure Laterality Date  . CHOLECYSTECTOMY  04/23/2010   laparoscopic  . COLONOSCOPY WITH PROPOFOL N/A 05/17/2016   Procedure: COLONOSCOPY WITH PROPOFOL;  Surgeon: West Bali, MD;  [facility];  Service: Endoscopy;  Laterality: N/A;  1000  . HEMORRHOID BANDING N/A 05/17/2016   Procedure: HEMORRHOID BANDING;  Surgeon: West Bali, MD;  [facility];  Service: Endoscopy;  Laterality: N/A;  . HEMORRHOID SURGERY N/A 06/08/2016   Procedure: SIMPLE HEMORRHOIDECTOMY;  Surgeon: Ancil Linsey, MD;  Location: [facility];  Service: General;  Laterality: N/A;  . PARTIAL NEPHRECTOMY Right age 26 mos.  . TONSILLECTOMY AND ADENOIDECTOMY N/A 01/29/2013   Procedure:  TONSILLECTOMY AND ADENOIDECTOMY;  Surgeon: Ascencion Dike, MD;  Location: [facility];  Service: ENT;  Laterality: N/A;    Family Psychiatric History: see below  Family History:  Family History  Problem Relation Age of Onset  . COPD Mother   . Depression Mother   . Anxiety disorder Mother   . Hypertension Mother   . Irritable bowel syndrome Mother   . Arthritis Mother   . Skin cancer Mother   . Thyroid cancer Mother   . Ovarian cancer Maternal Aunt   .  Ovarian cancer Maternal Grandmother   . Cancer Maternal Grandmother        bladder, kidney  . Hypertension Father   . GER disease Father   . Bipolar disorder Paternal Uncle   . Anxiety disorder Cousin   . Drug abuse Cousin   . Bipolar disorder Cousin   . Colon cancer Neg Hx     Social History:  Social History   Socioeconomic History  . Marital status: Married    Spouse name: Not on file  . Number of children: Not on file  . Years of education: Not on file  . Highest education level: Not on file  Occupational History  . Not on file  Tobacco Use  . Smoking status: Never Smoker  . Smokeless tobacco: Never Used  . Tobacco comment: Never smoked  Substance and Sexual Activity  . Alcohol use: Yes    Alcohol/week: 0.0 standard drinks    Comment: socially  . Drug use: No  . Sexual activity: Yes    Birth control/protection: Implant    Comment: nexplanon implant  Other Topics Concern  . Not on file  Social History Narrative  . Not on file   Social Determinants of Health   Financial Resource Strain:   . Difficulty of Paying Living Expenses:   Food Insecurity:   . Worried About Charity fundraiser in the Last Year:   . Arboriculturist in the Last Year:   Transportation Needs:   . Film/video editor (Medical):   Marland Kitchen Lack of Transportation (Non-Medical):   Physical Activity:   . Days of Exercise per Week:   . Minutes of Exercise per Session:   Stress:   . Feeling of Stress :   Social Connections:   . Frequency of Communication with Friends and Family:   . Frequency of Social Gatherings with Friends and Family:   . Attends Religious Services:   . Active Member of Clubs or Organizations:   . Attends Archivist Meetings:   Marland Kitchen Marital Status:     Allergies:  Allergies  Allergen Reactions  . Lortab [Hydrocodone-Acetaminophen] Nausea And Vomiting  . Morphine And Related Rash    Metabolic Disorder Labs: No results found for: HGBA1C, MPG No results found  for: PROLACTIN No results found for: CHOL, TRIG, HDL, CHOLHDL, VLDL, LDLCALC Lab Results  Component Value Date   TSH 2.553 10/13/2017    Therapeutic Level Labs: No results found for: LITHIUM No results found for: VALPROATE No components found for:  CBMZ  Current Medications: Current Outpatient Medications  Medication Sig Dispense Refill  . ALPRAZolam (XANAX) 1 MG tablet Take 1 tablet (1 mg total) by mouth 2 (two) times daily as needed for anxiety. 60 tablet 2  . amphetamine-dextroamphetamine (ADDERALL) 30 MG tablet Take 1 tablet by mouth every morning. 30 tablet 0  . amphetamine-dextroamphetamine (ADDERALL) 30 MG tablet Take 1 tablet by mouth every morning. 30 tablet  0  . amphetamine-dextroamphetamine (ADDERALL) 30 MG tablet Take 1 tablet by mouth every morning. 30 tablet 0  . dicyclomine (BENTYL) 10 MG capsule Take 1 capsule (10 mg total) by mouth 4 (four) times daily -  before meals and at bedtime. 90 capsule 0  . etonogestrel (NEXPLANON) 68 MG IMPL implant 1 each by Subdermal route once.    Marland Kitchen FLUoxetine (PROZAC) 40 MG capsule TAKE ONE CAPSULE BY MOUTH EVERY DAY 30 capsule 2  . megestrol (MEGACE) 40 MG tablet Take 3 x 5 days then 2 x 5 days then 1 daily 45 tablet 1  . meloxicam (MOBIC) 7.5 MG tablet TAKE 1 TABLET BY MOUTH DAILY FOR NECK PAIN AND HEADACHE. MAY INCREASE TO 2 TABLEST IF NOT EFFECTIVE    . omeprazole (PRILOSEC) 40 MG capsule Take 1 capsule (40 mg total) by mouth daily. 90 capsule 3   No current facility-administered medications for this visit.     Musculoskeletal: Strength & Muscle Tone: within normal limits Gait & Station: normal Patient leans: N/A  Psychiatric Specialty Exam: Review of Systems  All other systems reviewed and are negative.   There were no vitals taken for this visit.There is no height or weight on file to calculate BMI.  General Appearance: Casual, Neat and Well Groomed  Eye Contact:  Good  Speech:  Clear and Coherent  Volume:  Normal   Mood:  Euthymic  Affect:  Appropriate and Congruent  Thought Process:  Goal Directed  Orientation:  Full (Time, Place, and Person)  Thought Content: WDL   Suicidal Thoughts:  No  Homicidal Thoughts:  No  Memory:  Immediate;   Good Recent;   Good Remote;   Good  Judgement:  Good  Insight:  Fair  Psychomotor Activity:  Normal  Concentration:  Concentration: Good and Attention Span: Good  Recall:  Good  Fund of Knowledge: Good  Language: Good  Akathisia:  No  Handed:  Right  AIMS (if indicated): not done  Assets:  Communication Skills Desire for Improvement Physical Health Resilience Social Support Talents/Skills Vocational/Educational  ADL's:  Intact  Cognition: WNL  Sleep:  Good   Screenings: GAD-7     Counsel[facility]27[facility]2018 [facility]  Total GAD-7 Score  18    PHQ2-9     Counselor from 09/27/2018 in [facility] Office Visit from 10/16/2017 in [facility] Counselor from 10/13/2017 in [facility] Nutrition from 07/30/2014 in [facility]  PHQ-2 Total Score  6  6  6   (Pended)   0  PHQ-9 Total Score  21  16  21   (Pended)   --       Assessment and Plan: This patient is a 34 year old female with a history of depression anxiety and ADD.  She continues to do very well on her current regimen.  She will continue Prozac 40 mg daily for depression, Xanax 1 mg twice daily as needed for anxiety and Adderall 30 mg every morning for focus.  She will return to see me in 3 months   , MD 05/05/2020, 11:09 AM

## 2020-06-05 ENCOUNTER — Other Ambulatory Visit: Payer: Self-pay

## 2020-06-05 ENCOUNTER — Emergency Department (HOSPITAL_COMMUNITY)
Admission: EM | Admit: 2020-06-05 | Discharge: 2020-06-06 | Disposition: A | Discharge status: Home / Self Care | Payer: PRIVATE HEALTH INSURANCE | Attending: Emergency Medicine | Admitting: Emergency Medicine

## 2020-06-05 ENCOUNTER — Emergency Department (HOSPITAL_COMMUNITY): Payer: PRIVATE HEALTH INSURANCE

## 2020-06-05 ENCOUNTER — Encounter (HOSPITAL_COMMUNITY): Payer: Self-pay | Admitting: Emergency Medicine

## 2020-06-05 DIAGNOSIS — K5900 Constipation, unspecified: Secondary | ICD-10-CM | POA: Insufficient documentation

## 2020-06-05 DIAGNOSIS — R110 Nausea: Secondary | ICD-10-CM | POA: Insufficient documentation

## 2020-06-05 DIAGNOSIS — R102 Pelvic and perineal pain: Secondary | ICD-10-CM

## 2020-06-05 DIAGNOSIS — R1030 Lower abdominal pain, unspecified: Secondary | ICD-10-CM

## 2020-06-05 DIAGNOSIS — I10 Essential (primary) hypertension: Secondary | ICD-10-CM | POA: Insufficient documentation

## 2020-06-05 DIAGNOSIS — Z79899 Other long term (current) drug therapy: Secondary | ICD-10-CM | POA: Diagnosis not present

## 2020-06-05 HISTORY — DX: Essential (primary) hypertension: I10

## 2020-06-05 LAB — COMPREHENSIVE METABOLIC PANEL
ALT: 15 U/L (ref 0–44)
AST: 19 U/L (ref 15–41)
Albumin: 4.2 g/dL (ref 3.5–5.0)
Alkaline Phosphatase: 116 U/L (ref 38–126)
Anion gap: 9 (ref 5–15)
BUN: 15 mg/dL (ref 6–20)
CO2: 26 mmol/L (ref 22–32)
Calcium: 8.8 mg/dL — ABNORMAL LOW (ref 8.9–10.3)
Chloride: 101 mmol/L (ref 98–111)
Creatinine, Ser: 0.97 mg/dL (ref 0.44–1.00)
GFR calc Af Amer: >60 mL/min (ref >60)
GFR calc non Af Amer: >60 mL/min (ref >60)
Glucose, Bld: 76 mg/dL (ref 70–99)
Potassium: 3.6 mmol/L (ref 3.5–5.1)
Sodium: 136 mmol/L (ref 135–145)
Total Bilirubin: 0.3 mg/dL (ref 0.3–1.2)
Total Protein: 7.1 g/dL (ref 6.5–8.1)

## 2020-06-05 LAB — CBC WITH DIFFERENTIAL/PLATELET
Abs Immature Granulocytes: 0.03 10*3/uL (ref 0.00–0.07)
Basophils Absolute: 0 10*3/uL (ref 0.0–0.1)
Basophils Relative: 0 %
Eosinophils Absolute: 0.5 10*3/uL (ref 0.0–0.5)
Eosinophils Relative: 4 %
HCT: 35.2 % — ABNORMAL LOW (ref 36.0–46.0)
Hemoglobin: 12.1 g/dL (ref 12.0–15.0)
Immature Granulocytes: 0 %
Lymphocytes Relative: 28 %
Lymphs Abs: 3.5 10*3/uL (ref 0.7–4.0)
MCH: 32.4 pg (ref 26.0–34.0)
MCHC: 34.4 g/dL (ref 30.0–36.0)
MCV: 94.1 fL (ref 80.0–100.0)
Monocytes Absolute: 1 10*3/uL (ref 0.1–1.0)
Monocytes Relative: 8 %
Neutro Abs: 7.3 10*3/uL (ref 1.7–7.7)
Neutrophils Relative %: 60 %
Platelets: 270 10*3/uL (ref 150–400)
RBC: 3.74 MIL/uL — ABNORMAL LOW (ref 3.87–5.11)
RDW: 12 % (ref 11.5–15.5)
WBC: 12.4 10*3/uL — ABNORMAL HIGH (ref 4.0–10.5)
nRBC: 0 % (ref 0.0–0.2)

## 2020-06-05 LAB — I-STAT BETA HCG BLOOD, ED (MC, WL, AP ONLY): I-stat hCG, quantitative: <5 m[IU]/mL (ref <5)

## 2020-06-05 LAB — URINALYSIS, ROUTINE W REFLEX MICROSCOPIC
Bacteria, UA: NONE SEEN
Bilirubin Urine: NEGATIVE
Glucose, UA: NEGATIVE mg/dL
Ketones, ur: NEGATIVE mg/dL
Leukocytes,Ua: NEGATIVE
Nitrite: NEGATIVE
Protein, ur: NEGATIVE mg/dL
Specific Gravity, Urine: 1.018 (ref 1.005–1.030)
pH: 6 (ref 5.0–8.0)

## 2020-06-05 MED ORDER — SODIUM CHLORIDE 0.9 % IV BOLUS: 1000.0000 mL | Freq: Once | INTRAVENOUS | Status: DC

## 2020-06-05 MED ORDER — IOHEXOL 300 MG/ML  SOLN
100.0000 mL | Freq: Once | INTRAMUSCULAR | Status: AC | PRN
  Administered 2020-06-06: 100 mL via INTRAVENOUS

## 2020-06-05 MED ORDER — SODIUM CHLORIDE 0.9 % IV BOLUS
1000.0000 mL | Freq: Once | INTRAVENOUS | Status: AC
  Administered 2020-06-05: 1000 mL via INTRAVENOUS

## 2020-06-05 MED ORDER — HYDROMORPHONE HCL 1 MG/ML IJ SOLN
1.0000 mg | Freq: Once | INTRAMUSCULAR | Status: AC
  Administered 2020-06-05: 1 mg via INTRAVENOUS
  Filled 2020-06-05: qty 1

## 2020-06-05 MED ORDER — ONDANSETRON HCL 4 MG/2ML IJ SOLN
4.0000 mg | Freq: Once | INTRAMUSCULAR | Status: AC
  Administered 2020-06-05: 4 mg via INTRAVENOUS
  Filled 2020-06-05: qty 2

## 2020-06-05 NOTE — ED Provider Notes (Signed)
[facility] EMERGENCY DEPARTMENT Provider Note   CSN: [id number] Arrival date & time: 06/05/20  2133     History Chief Complaint  Patient presents with  . Sandra Glover    Sandra Glover is a 34 y.o. female.  HPI    34 year old female comes in a chief complaint of Sandra Glover. Patient has history of cholecystectomy and ovarian cyst.  She reports that she started having lower quadrant Sandra Glover yesterday night.  The Glover was excruciating at first.  The severe Glover resolved and since then she has had constant dull Glover.  The Glover became worse as the day progressed at work, and she decided to come in.  She denies any associated nausea, vomiting, diarrhea, vaginal discharge or bleeding.  No UTI-like symptoms.  She denies similar symptoms in the past and lower quadrant of her abdomen.  Patient denies any high risk sexual behavior and is not concerned of STD.  Past Medical History:  Diagnosis Date  . Anxiety   . Anxiety   . Depression   . Gestational diabetes mellitus, antepartum    gestational only  . History of anemia 2013  . History of depression    states stopped med. in 2013  . 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 Hypertension   . Pertussis   . Postcoital bleeding 07/14/2015  . Sinus infection 01/25/2013   started antibiotic 01/24/2013 x 10 days; nasal congestion, clear drainage from nose  . Tonsillar and adenoid hypertrophy 01/2013  . Vaginal irritation 03/25/2014   Mild yeast but has history of yeast, will not treat til after first trimester,try yogurt    Patient Active Problem List   Diagnosis Date Noted  . Abnormal vaginal bleeding 02/04/2020  . Nexplanon in place 02/04/2020  . Menstrual cramps 02/04/2020  . Pregnancy examination or test, negative result 02/04/2020  . Diarrhea 12/26/2018  . Sandra Glover 12/26/2018  . Nexplanon insertion 09/25/2017  . Hemorrhoids 09/19/2016  . Rectal bleeding 03/24/2016  .  Constipation 03/24/2016  . Postcoital bleeding 07/14/2015  . History of preterm delivery 09/09/2014  . Postpartum UTI 09/09/2014  . Pertussis 07/30/2014  . History of gestational diabetes 07/24/2014  . 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 Depression with anxiety 03/03/2014    Past Surgical History:  Procedure Laterality Date  . CHOLECYSTECTOMY  04/23/2010   laparoscopic  . COLONOSCOPY WITH PROPOFOL N/A 05/17/2016   Procedure: COLONOSCOPY WITH PROPOFOL;  Surgeon: West Bali, MD;  [facility];  Service: Endoscopy;  Laterality: N/A;  1000  . HEMORRHOID BANDING N/A 05/17/2016   Procedure: HEMORRHOID BANDING;  Surgeon: West Bali, MD;  Location: [facility];  Service: Endoscopy;  Laterality: N/A;  . HEMORRHOID SURGERY N/A 06/08/2016   Procedure: SIMPLE HEMORRHOIDECTOMY;  Surgeon: Ancil Linsey, MD;  Location: [facility] ORS;  Service: General;  Laterality: N/A;  . PARTIAL NEPHRECTOMY Right age 3 mos.  . TONSILLECTOMY AND ADENOIDECTOMY N/A 01/29/2013   Procedure: TONSILLECTOMY AND ADENOIDECTOMY;  Surgeon: Darletta Moll, MD;  Location:  SURGERY CENTER;  Service: ENT;  Laterality: N/A;     OB History    Gravida  2   Para  2   Term  1   Preterm  1   AB      Living  2     SAB      TAB      Ectopic      Multiple  Live Births  2           Family History  Problem Relation Age of Onset  . COPD Mother   . Depression Mother   . Anxiety disorder Mother   . Hypertension Mother   . Irritable bowel syndrome Mother   . Arthritis Mother   . Skin cancer Mother   . Thyroid cancer Mother   . Ovarian cancer Maternal Aunt   . Ovarian cancer Maternal Grandmother   . Cancer Maternal Grandmother        bladder, kidney  . Hypertension Father   . GER disease Father   . Bipolar disorder Paternal Uncle   . Anxiety disorder Cousin   . Drug abuse Cousin   . Bipolar disorder Cousin   . Colon cancer Neg Hx     Social History   Tobacco Use   . Smoking status: Never Smoker  . Smokeless tobacco: Never Used  . Tobacco comment: Never smoked  Vaping Use  . Vaping Use: Never used  Substance Use Topics  . Alcohol use: Yes    Alcohol/week: 0.0 standard drinks    Comment: socially  . Drug use: No    Home Medications Prior to Admission medications   Medication Sig Start Date End Date Taking? Authorizing Provider  ALPRAZolam Prudy Feeler) 1 MG tablet Take 1 tablet (1 mg total) by mouth 2 (two) times daily as needed for anxiety. 05/05/20 05/05/21  Myrlene Broker, MD  amphetamine-dextroamphetamine (ADDERALL) 30 MG tablet Take 1 tablet by mouth every morning. 05/05/20 05/05/21  Myrlene Broker, MD  amphetamine-dextroamphetamine (ADDERALL) 30 MG tablet Take 1 tablet by mouth every morning. 05/05/20 05/05/21  Myrlene Broker, MD  amphetamine-dextroamphetamine (ADDERALL) 30 MG tablet Take 1 tablet by mouth every morning. 05/05/20 05/05/21  Myrlene Broker, MD  dicyclomine (BENTYL) 10 MG capsule Take 1 capsule (10 mg total) by mouth 4 (four) times daily -  before meals and at bedtime. 06/17/19   Anice Paganini, NP  etonogestrel (NEXPLANON) 68 MG IMPL implant 1 each by Subdermal route once.    [provider]  FLUoxetine (PROZAC) 40 MG capsule TAKE ONE CAPSULE BY MOUTH EVERY DAY 05/05/20   Myrlene Broker, MD  megestrol (MEGACE) 40 MG tablet Take 3 x 5 days then 2 x 5 days then 1 daily 02/04/20   Adline Potter, NP  meloxicam (MOBIC) 7.5 MG tablet TAKE 1 TABLET BY MOUTH DAILY FOR NECK Glover AND HEADACHE. MAY INCREASE TO 2 TABLEST IF NOT EFFECTIVE 08/15/19   [provider]  omeprazole (PRILOSEC) 40 MG capsule Take 1 capsule (40 mg total) by mouth daily. 06/17/19   Anice Paganini, NP    Allergies    Lortab [hydrocodone-acetaminophen] and Morphine and related  Review of Systems   Review of Systems  Constitutional: Positive for activity change.  Gastrointestinal: Positive for Sandra Glover and nausea. Negative for vomiting.  Genitourinary: Positive  for pelvic Glover. Negative for dysuria, hematuria, vaginal bleeding and vaginal discharge.  Allergic/Immunologic: Negative for immunocompromised state.  Hematological: Does not bruise/bleed easily.  All other systems reviewed and are negative.   Physical Exam Updated Vital Signs BP 107/74   Pulse 94   Temp 98.6 F (37 C) (Oral)   Resp 17   Ht 5\' 3"  (1.6 m)   Wt 81.2 kg   SpO2 98%   BMI 31.71 kg/m   Physical Exam Vitals and nursing note reviewed.  Constitutional:      Appearance: She is  well-developed.  HENT:     Head: Normocephalic and atraumatic.  Cardiovascular:     Rate and Rhythm: Normal rate.  Pulmonary:     Effort: Pulmonary effort is normal.  Sandra:     General: Bowel sounds are normal.     Tenderness: There is Sandra tenderness in the right lower quadrant, suprapubic area and left lower quadrant. There is no guarding or rebound.  Musculoskeletal:     Cervical back: Normal range of motion and neck supple.  Skin:    General: Skin is warm and dry.  Neurological:     Mental Status: She is alert and oriented to person, place, and time.     ED Results / Procedures / Treatments   Labs (all labs ordered are listed, but only abnormal results are displayed) Labs Reviewed  COMPREHENSIVE METABOLIC PANEL  CBC WITH DIFFERENTIAL/PLATELET  URINALYSIS, ROUTINE W REFLEX MICROSCOPIC  I-STAT BETA HCG BLOOD, ED ([facility], [facility], [facility] ONLY)    EKG None  Radiology No results found.  Procedures Procedures (including critical care time)  Medications Ordered in ED Medications  sodium chloride 0.9 % bolus 1,000 mL (has no administration in time range)    ED Course  I have reviewed the triage vital signs and the nursing notes.  Pertinent labs & imaging results that were available during my care of the patient were reviewed by me and considered in my medical decision making (see chart for details).    MDM Rules/Calculators/A&P                          34 year old  female comes in a chief complaint of lower quadrant Sandra Glover with associated bloating, but no vomiting, UTI-like symptoms or constipation/diarrhea.  She is status post cholecystectomy and has known history of ovarian cyst.  Differential diagnosis for sudden onset Glover includes kidney stone, ovarian torsion.  Since the Glover has been constantly present since the onset, small bowel obstruction also considered in the differential diagnosis.  Clinically does not appear that she has McBurney's and patient vehemently denies any STI risk factors or concerns of it.  Plan is to get basic labs and CT scan and work on symptom control for now.  If the CT is negative then she can be safely  Discharged.  Final Clinical Impression(s) / ED Diagnoses Final diagnoses:  None    Rx / DC Orders ED Discharge Orders    None       Derwood Kaplan, MD 06/06/20 0000

## 2020-06-05 NOTE — ED Triage Notes (Signed)
Pt C/O sharp lower abdominal pain that started last night. Pt reports nausea throughout the day today.

## 2020-06-06 ENCOUNTER — Emergency Department (HOSPITAL_COMMUNITY): Payer: PRIVATE HEALTH INSURANCE

## 2020-06-06 MED ORDER — KETOROLAC TROMETHAMINE 30 MG/ML IJ SOLN
30.0000 mg | Freq: Once | INTRAMUSCULAR | Status: AC
  Administered 2020-06-06: 30 mg via INTRAVENOUS
  Filled 2020-06-06: qty 1

## 2020-06-06 NOTE — Discharge Instructions (Addendum)
Get miralax and put one dose or 17 g in 8 ounces of water,  take 1 dose every 30 minutes for 2-3 hours or until you  get good results and then once or twice daily to prevent constipation.  ° °

## 2020-06-06 NOTE — ED Provider Notes (Signed)
Patient was left at change of shift to get the results of her CT scan.  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  She does not feel like her constipation is worse than usual.  She is currently being treated with dicyclomine although she has been on a needs for and Linzess in the past.  She states she had ovarian cyst on ultrasound in 2018 and she shows me an ultrasound report showing a 3 cm hemorrhagic cyst on the right ovary.  She was [facility]  She states on July 1 she had severe right-sided pelvic pain lasted 20 to 30 minutes that then eased up and was not as severe.  It continued throughout the day on July 2 and she felt bloated.  She has had Dilaudid and states her pain is better now.  When I palpate her abdomen she has minimal discomfort, there is no guarding or rebound.  Pelvic ultrasound with Doppler was done to look for ovarian torsion.  After ultrasound nurse reported patient was requesting more pain medicine, she was given IV Toradol.  I personally reviewed her CT scan and she has lots of stool throughout her whole colon.  4:30 AM I discussed her ultrasound results.  I think we need to treat her for constipation.  I offered to give her dicyclomine in the ED but she states she has some pills in her purse.   CT ABDOMEN PELVIS W CONTRAST  Result Date: 06/06/2020 CLINICAL DATA:  Abdominal distension.  Lower abdominal pain. EXAM: CT ABDOMEN AND PELVIS WITH CONTRAST TECHNIQUE: Multidetector CT imaging of the abdomen and pelvis was performed using the standard protocol following bolus administration of intravenous contrast. CONTRAST:  OMNIPAQUE IOHEXOL 300 MG/ML  SOLN COMPARISON:  CT 07/19/2019 FINDINGS: Lower chest: Minor hypoventilatory atelectasis dependently. No pleural fluid. Hepatobiliary: No focal liver abnormality is seen. Status post cholecystectomy. No biliary dilatation. Pancreas: No ductal dilatation or inflammation. Spleen: Normal in size without focal abnormality.  Adrenals/Urinary Tract: Normal adrenal glands. No hydronephrosis or perinephric edema. Minimal cortical scarring in the upper right kidney. Homogeneous renal enhancement. Urinary bladder is physiologically distended without wall thickening. Stomach/Bowel: Ingested material distends the stomach. Normal positioning of the duodenum and ligament of Treitz. There is no small bowel obstruction or inflammation. Normal appendix. Moderate volume of stool throughout the colon. No colonic wall thickening. Sigmoid colon is tortuous. Vascular/Lymphatic: Normal caliber abdominal aorta. Patent portal vein. No adenopathy. Reproductive: Uterus and bilateral adnexa are unremarkable. Other: No free air, free fluid, or intra-abdominal fluid collection. Small fat containing umbilical hernia. Musculoskeletal: There are no acute or suspicious osseous abnormalities. IMPRESSION: 1. No acute abnormality in the abdomen/pelvis. 2. Moderate colonic stool burden, can be seen with constipation. No bowel obstruction. 3. Tiny fat containing umbilical hernia. Electronically Signed   By: Narda Rutherford M.D.   On: 06/06/2020 00:29   US PELVIC DOPPLER (TORSION R/O OR MASS ARTERIAL FLOW)  Result Date: 06/06/2020 CLINICAL DATA:  24 hours of pelvic pain EXAM: TRANSABDOMINAL AND TRANSVAGINAL ULTRASOUND OF PELVIS DOPPLER ULTRASOUND OF OVARIES TECHNIQUE: Both transabdominal and transvaginal ultrasound examinations of the pelvis were performed. Transabdominal technique was performed for global imaging of the pelvis including uterus, ovaries, adnexal regions, and pelvic cul-de-sac. It was necessary to proceed with endovaginal exam following the transabdominal exam to visualize the endometrium. Color and duplex Doppler ultrasound was utilized to evaluate blood flow to the ovaries. COMPARISON:  CT abdomen pelvis 06/05/2020, pelvic ultrasound 03/24/2017 FINDINGS: Uterus Measurements: 8.2 x 3.3  x 4.4 cm = volume: 63.5 mL. No fibroids or other mass visualized.  Endometrium Thickness: 2 mm, non thickened.  No focal abnormality visualized. Right ovary Measurements: 2.7 x 1.7 x 1.6 cm = volume: 4 mL. Normal appearance/no adnexal mass. Left ovary Measurements: 2.6 x 2.8 x 2.9 cm = volume: 8.5 mL. Normal appearance/no adnexal mass. Pulsed Doppler evaluation of both ovaries demonstrates normal low-resistance arterial and venous waveforms. Other findings No abnormal free fluid. IMPRESSION: Unremarkable ultrasound and Doppler evaluation of the pelvis. Electronically Signed   By: Kreg Shropshire M.D.   On: 06/06/2020 04:20   Diagnoses that have been ruled out:  None  Diagnoses that are still under consideration:  None  Final diagnoses:  Pelvic pain  Lower abdominal pain  Constipation, unspecified constipation type   ED Discharge Orders    None    OTC miralax  Plan discharge  Devoria Albe, MD, Concha Pyo, MD 06/06/20 [phone]

## 2020-08-05 ENCOUNTER — Other Ambulatory Visit: Payer: Self-pay

## 2020-08-05 ENCOUNTER — Encounter (HOSPITAL_COMMUNITY): Payer: Self-pay | Admitting: Psychiatry

## 2020-08-05 ENCOUNTER — Telehealth (INDEPENDENT_AMBULATORY_CARE_PROVIDER_SITE_OTHER): Payer: PRIVATE HEALTH INSURANCE | Admitting: Psychiatry

## 2020-08-05 DIAGNOSIS — F900 Attention-deficit hyperactivity disorder, predominantly inattentive type: Secondary | ICD-10-CM | POA: Diagnosis not present

## 2020-08-05 DIAGNOSIS — F4310 Post-traumatic stress disorder, unspecified: Secondary | ICD-10-CM

## 2020-08-05 DIAGNOSIS — M1990 Unspecified osteoarthritis, unspecified site: Secondary | ICD-10-CM | POA: Insufficient documentation

## 2020-08-05 DIAGNOSIS — F331 Major depressive disorder, recurrent, moderate: Secondary | ICD-10-CM

## 2020-08-05 MED ORDER — ALPRAZOLAM 1 MG PO TABS: 1.0000 mg | ORAL_TABLET | Freq: Two times a day (BID) | ORAL | 2 refills | Status: DC | PRN

## 2020-08-05 MED ORDER — AMPHETAMINE-DEXTROAMPHETAMINE 30 MG PO TABS: 30.0000 mg | ORAL_TABLET | ORAL | 0 refills | Status: DC

## 2020-08-05 MED ORDER — FLUOXETINE HCL 40 MG PO CAPS: ORAL_CAPSULE | ORAL | 2 refills | Status: DC

## 2020-08-05 NOTE — Progress Notes (Signed)
Virtual Visit via Telephone Note  I connected with Sandra Glover on 08/05/20 at 11:00 AM EDT by telephone and verified that I am speaking with the correct person using two identifiers.   I discussed the limitations, risks, security and privacy concerns of performing an evaluation and management service by telephone and the availability of in person appointments. I also discussed with the patient that there may be a patient responsible charge related to this service. The patient expressed understanding and agreed to proceed.    I discussed the assessment and treatment plan with the patient. The patient was provided an opportunity to ask questions and all were answered. The patient agreed with the plan and demonstrated an understanding of the instructions.   The patient was advised to call back or seek an in-person evaluation if the symptoms worsen or if the condition fails to improve as anticipated.  I provided 15 minutes of non-face-to-face time during this encounter. Location: Provider Home, patient home  [name], MD  [facility] MD/PA/NP OP Progress Note  08/05/2020 11:19 AM Sandra Glover  MRN:  [id number]  Chief Complaint:  Chief Complaint    Depression; Anxiety; ADD; Follow-up     HPI: This patient is a 34 year old married white female who lives with her husband and two children in [place]. 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  The patient returns for follow-up after 3 months. For the most part she is doing well. She has been a bit more anxious lately as her mother has had some more health problems and her parents family dog has to be put to sleep. Nevertheless she is doing very well at her job staying focused she is sleeping well. Her children are doing well and have returned to in person school. She denies significant depression or thoughts of suicidal ideation or severe anxiety. Because she is a little stressed right now she is taking the Xanax as  needed. Visit Diagnosis:    ICD-10-CM   1. Major depressive disorder, recurrent episode, moderate (HCC)  F33.1   2. Attention deficit hyperactivity disorder (ADHD), predominantly inattentive type  F90.0   3. PTSD (post-traumatic stress disorder)  F43.10     Past Psychiatric History: Long-term outpatient treatment  Past Medical History:  Past Medical History:  Diagnosis Date  . Anxiety   . Anxiety   . Depression   . Gestational diabetes mellitus, antepartum    gestational only  . History of anemia 2013  . History of depression    states stopped med. in 2013  . History of partial nephrectomy age 7 mos.   right - due to kidney infection  . Hypertension   . Pertussis   . Postcoital bleeding 07/14/2015  . Sinus infection 01/25/2013   started antibiotic 01/24/2013 x 10 days; nasal congestion, clear drainage from nose  . Tonsillar and adenoid hypertrophy 01/2013  . Vaginal irritation 03/25/2014   Mild yeast but has history of yeast, will not treat til after first trimester,try yogurt    Past Surgical History:  Procedure Laterality Date  . CHOLECYSTECTOMY  04/23/2010   laparoscopic  . COLONOSCOPY WITH PROPOFOL N/A 05/17/2016   Procedure: COLONOSCOPY WITH PROPOFOL;  Surgeon: West Bali, MD;  [facility];  Service: Endoscopy;  Laterality: N/A;  1000  . HEMORRHOID BANDING N/A 05/17/2016   Procedure: HEMORRHOID BANDING;  Surgeon: West Bali, MD;  [facility];  Service: Endoscopy;  Laterality: N/A;  . HEMORRHOID SURGERY  N/A 06/08/2016   Procedure: SIMPLE HEMORRHOIDECTOMY;  Surgeon: Ancil Linsey, MD;  Location: [facility];  Service: General;  Laterality: N/A;  . PARTIAL NEPHRECTOMY Right age 58 mos.  . TONSILLECTOMY AND ADENOIDECTOMY N/A 01/29/2013   Procedure: TONSILLECTOMY AND ADENOIDECTOMY;  Surgeon: Darletta Moll, MD;  Location:  SURGERY CENTER;  Service: ENT;  Laterality: N/A;    Family Psychiatric History: see below  Family History:  Family  History  Problem Relation Age of Onset  . COPD Mother   . Depression Mother   . Anxiety disorder Mother   . Hypertension Mother   . Irritable bowel syndrome Mother   . Arthritis Mother   . Skin cancer Mother   . Thyroid cancer Mother   . Ovarian cancer Maternal Aunt   . Ovarian cancer Maternal Grandmother   . Cancer Maternal Grandmother        bladder, kidney  . Hypertension Father   . GER disease Father   . Bipolar disorder Paternal Uncle   . Anxiety disorder Cousin   . Drug abuse Cousin   . Bipolar disorder Cousin   . Colon cancer Neg Hx     Social History:  Social History   Socioeconomic History  . Marital status: Married    Spouse name: Not on file  . Number of children: Not on file  . Years of education: Not on file  . Highest education level: Not on file  Occupational History  . Not on file  Tobacco Use  . Smoking status: Never Smoker  . Smokeless tobacco: Never Used  . Tobacco comment: Never smoked  Vaping Use  . Vaping Use: Never used  Substance and Sexual Activity  . Alcohol use: Yes    Alcohol/week: 0.0 standard drinks    Comment: socially  . Drug use: No  . Sexual activity: Yes    Birth control/protection: Implant    Comment: nexplanon implant  Other Topics Concern  . Not on file  Social History Narrative  . Not on file   Social Determinants of Health   Financial Resource Strain:   . Difficulty of Paying Living Expenses: Not on file  Food Insecurity:   . Worried About Programme researcher, broadcasting/film/video in the Last Year: Not on file  . Ran Out of Food in the Last Year: Not on file  Transportation Needs:   . Lack of Transportation (Medical): Not on file  . Lack of Transportation (Non-Medical): Not on file  Physical Activity:   . Days of Exercise per Week: Not on file  . Minutes of Exercise per Session: Not on file  Stress:   . Feeling of Stress : Not on file  Social Connections:   . Frequency of Communication with Friends and Family: Not on file  .  Frequency of Social Gatherings with Friends and Family: Not on file  . Attends Religious Services: Not on file  . Active Member of Clubs or Organizations: Not on file  . Attends Banker Meetings: Not on file  . Marital Status: Not on file    Allergies:  Allergies  Allergen Reactions  . Lortab [Hydrocodone-Acetaminophen] Nausea And Vomiting  . Morphine And Related Rash    Metabolic Disorder Labs: No results found for: HGBA1C, MPG No results found for: PROLACTIN No results found for: CHOL, TRIG, HDL, CHOLHDL, VLDL, LDLCALC Lab Results  Component Value Date   TSH 2.553 10/13/2017    Therapeutic Level Labs: No results found for: LITHIUM No results found  for: VALPROATE No components found for:  CBMZ  Current Medications: Current Outpatient Medications  Medication Sig Dispense Refill  . ALPRAZolam (XANAX) 1 MG tablet Take 1 tablet (1 mg total) by mouth 2 (two) times daily as needed for anxiety. 60 tablet 2  . amphetamine-dextroamphetamine (ADDERALL) 30 MG tablet Take 1 tablet by mouth every morning. 30 tablet 0  . amphetamine-dextroamphetamine (ADDERALL) 30 MG tablet Take 1 tablet by mouth every morning. 30 tablet 0  . amphetamine-dextroamphetamine (ADDERALL) 30 MG tablet Take 1 tablet by mouth every morning. 30 tablet 0  . dicyclomine (BENTYL) 10 MG capsule Take 1 capsule (10 mg total) by mouth 4 (four) times daily -  before meals and at bedtime. 90 capsule 0  . etonogestrel (NEXPLANON) 68 MG IMPL implant 1 each by Subdermal route once.    Marland Kitchen FLUoxetine (PROZAC) 40 MG capsule TAKE ONE CAPSULE BY MOUTH EVERY DAY 30 capsule 2  . megestrol (MEGACE) 40 MG tablet Take 3 x 5 days then 2 x 5 days then 1 daily 45 tablet 1  . meloxicam (MOBIC) 7.5 MG tablet TAKE 1 TABLET BY MOUTH DAILY FOR NECK PAIN AND HEADACHE. MAY INCREASE TO 2 TABLEST IF NOT EFFECTIVE    . omeprazole (PRILOSEC) 40 MG capsule Take 1 capsule (40 mg total) by mouth daily. 90 capsule 3   No current  facility-administered medications for this visit.     Musculoskeletal: Strength & Muscle Tone: within normal limits Gait & Station: normal Patient leans: N/A  Psychiatric Specialty Exam: Review of Systems  Psychiatric/Behavioral: The patient is nervous/anxious.   All other systems reviewed and are negative.   There were no vitals taken for this visit.There is no height or weight on file to calculate BMI.  General Appearance: NA  Eye Contact:  NA  Speech:  Clear and Coherent  Volume:  Normal  Mood:  Euthymic  Affect:  NA  Thought Process:  Goal Directed  Orientation:  Full (Time, Place, and Person)  Thought Content: WDL   Suicidal Thoughts:  No  Homicidal Thoughts:  No  Memory:  Immediate;   Good Recent;   Good Remote;   Good  Judgement:  Good  Insight:  Good  Psychomotor Activity:  Normal  Concentration:  Concentration: Good and Attention Span: Good  Recall:  Good  Fund of Knowledge: Good  Language: Good  Akathisia:  No  Handed:  Right  AIMS (if indicated): not done  Assets:  Communication Skills Desire for Improvement Physical Health Resilience Social Support Talents/Skills Vocational/Educational  ADL's:  Intact  Cognition: WNL  Sleep:  Good   Screenings: GAD-7     Counselor from 09[facility]27[facility]2018 [facility]  Total GAD-7 Score 18    PHQ2-9     Counselor from 09/27/2018 in [facility] Office Visit from 10/16/2017 in [facility] Counselor from 10/13/2017 in [facility] Nutrition from 07/30/2014 in [facility]  PHQ-2 Total Score 6 6 6  (P)  0  PHQ-9 Total Score 21 16 21  (P)  --       Assessment and Plan: This patient is a 34 year old female with a history depression anxiety and ADD. She continues to do well on her current regimen. She will continue Prozac 40 mg daily for depression, Xanax 1 mg twice  daily as needed for anxiety and Adderall 30 mg every morning for focus. She will return to see me in 3 months   ,  MD 08/05/2020, 11:19 AM

## 2020-08-08 ENCOUNTER — Other Ambulatory Visit (HOSPITAL_COMMUNITY): Payer: Self-pay | Admitting: Psychiatry

## 2020-08-12 ENCOUNTER — Telehealth: Payer: Self-pay | Admitting: Adult Health

## 2020-08-12 NOTE — Telephone Encounter (Signed)
Telephoned patient at home number and left message to return call.  

## 2020-08-12 NOTE — Telephone Encounter (Signed)
Patient states she gets frequent yeast infections and would like provider to send diflucan to pharmacy for her.

## 2020-08-28 ENCOUNTER — Other Ambulatory Visit: Payer: Self-pay | Admitting: Nurse Practitioner

## 2020-08-28 DIAGNOSIS — K219 Gastro-esophageal reflux disease without esophagitis: Secondary | ICD-10-CM

## 2020-08-28 DIAGNOSIS — R197 Diarrhea, unspecified: Secondary | ICD-10-CM

## 2020-08-28 NOTE — Telephone Encounter (Signed)
Refilled X 1. Please send letter for office visit prior to additional refills.

## 2020-08-31 ENCOUNTER — Encounter: Payer: Self-pay | Admitting: Gastroenterology

## 2020-09-28 ENCOUNTER — Other Ambulatory Visit: Payer: Self-pay

## 2020-09-28 ENCOUNTER — Ambulatory Visit (INDEPENDENT_AMBULATORY_CARE_PROVIDER_SITE_OTHER): Payer: PRIVATE HEALTH INSURANCE | Admitting: Women's Health

## 2020-09-28 ENCOUNTER — Encounter: Payer: Self-pay | Admitting: Women's Health

## 2020-09-28 VITALS — BP 111/76 | HR 87 | Ht 63.0 in | Wt 172.4 lb

## 2020-09-28 DIAGNOSIS — Z3046 Encounter for surveillance of implantable subdermal contraceptive: Secondary | ICD-10-CM

## 2020-09-28 DIAGNOSIS — Z30017 Encounter for initial prescription of implantable subdermal contraceptive: Secondary | ICD-10-CM

## 2020-09-28 MED ORDER — ETONOGESTREL 68 MG ~~LOC~~ IMPL
68.0000 mg | DRUG_IMPLANT | Freq: Once | SUBCUTANEOUS | Status: AC
  Administered 2020-09-28: 68 mg via SUBCUTANEOUS

## 2020-09-28 NOTE — Patient Instructions (Signed)
Keep the area clean and dry.  You can remove the big bandage in 24 hours, and the small steri-strip bandage in 3-5 days.  A back up method, such as condoms, should be used for two weeks. You may have irregular vaginal bleeding for the first 6 months after the Nexplanon is placed, then the bleeding usually lightens and it is possible that you may not have any periods.  If you have any concerns, please give us a call.    Etonogestrel implant What is this medicine? ETONOGESTREL (et oh noe JES trel) is a contraceptive (birth control) device. It is used to prevent pregnancy. It can be used for up to 3 years. This medicine may be used for other purposes; ask your health care provider or pharmacist if you have questions. COMMON BRAND NAME(S): Implanon, Nexplanon What should I tell my health care provider before I take this medicine? They need to know if you have any of these conditions:  abnormal vaginal bleeding  blood vessel disease or blood clots  breast, cervical, endometrial, ovarian, liver, or uterine cancer  diabetes  gallbladder disease  heart disease or recent heart attack  high blood pressure  high cholesterol or triglycerides  kidney disease  liver disease  migraine headaches  seizures  stroke  tobacco smoker  an unusual or allergic reaction to etonogestrel, anesthetics or antiseptics, other medicines, foods, dyes, or preservatives  pregnant or trying to get pregnant  breast-feeding How should I use this medicine? This device is inserted just under the skin on the inner side of your upper arm by a health care professional. Talk to your pediatrician regarding the use of this medicine in children. Special care may be needed. Overdosage: If you think you have taken too much of this medicine contact a poison control center or emergency room at once. NOTE: This medicine is only for you. Do not share this medicine with others. What if I miss a dose? This does not  apply. What may interact with this medicine? Do not take this medicine with any of the following medications:  amprenavir  fosamprenavir This medicine may also interact with the following medications:  acitretin  aprepitant  armodafinil  bexarotene  bosentan  carbamazepine  certain medicines for fungal infections like fluconazole, ketoconazole, itraconazole and voriconazole  certain medicines to treat hepatitis, HIV or AIDS  cyclosporine  felbamate  griseofulvin  lamotrigine  modafinil  oxcarbazepine  phenobarbital  phenytoin  primidone  rifabutin  rifampin  rifapentine  St. John's wort  topiramate This list may not describe all possible interactions. Give your health care provider a list of all the medicines, herbs, non-prescription drugs, or dietary supplements you use. Also tell them if you smoke, drink alcohol, or use illegal drugs. Some items may interact with your medicine. What should I watch for while using this medicine? This product does not protect you against HIV infection (AIDS) or other sexually transmitted diseases. You should be able to feel the implant by pressing your fingertips over the skin where it was inserted. Contact your doctor if you cannot feel the implant, and use a non-hormonal birth control method (such as condoms) until your doctor confirms that the implant is in place. Contact your doctor if you think that the implant may have broken or become bent while in your arm. You will receive a user card from your health care provider after the implant is inserted. The card is a record of the location of the implant in your upper arm   and when it should be removed. Keep this card with your health records. What side effects may I notice from receiving this medicine? Side effects that you should report to your doctor or health care professional as soon as possible:  allergic reactions like skin rash, itching or hives, swelling of the  face, lips, or tongue  breast lumps, breast tissue changes, or discharge  breathing problems  changes in emotions or moods  coughing up blood  if you feel that the implant may have broken or bent while in your arm  high blood pressure  pain, irritation, swelling, or bruising at the insertion site  scar at site of insertion  signs of infection at the insertion site such as fever, and skin redness, pain or discharge  signs and symptoms of a blood clot such as breathing problems; changes in vision; chest pain; severe, sudden headache; pain, swelling, warmth in the leg; trouble speaking; sudden numbness or weakness of the face, arm or leg  signs and symptoms of liver injury like dark yellow or brown urine; general ill feeling or flu-like symptoms; light-colored stools; loss of appetite; nausea; right upper belly pain; unusually weak or tired; yellowing of the eyes or skin  unusual vaginal bleeding, discharge Side effects that usually do not require medical attention (report to your doctor or health care professional if they continue or are bothersome):  acne  breast pain or tenderness  headache  irregular menstrual bleeding  nausea This list may not describe all possible side effects. Call your doctor for medical advice about side effects. You may report side effects to FDA at 1-800-FDA-1088. Where should I keep my medicine? This drug is given in a hospital or clinic and will not be stored at home. NOTE: This sheet is a summary. It may not cover all possible information. If you have questions about this medicine, talk to your doctor, pharmacist, or health care provider.  2020 Elsevier/Gold Standard (2019-09-03 11:33:04)  

## 2020-09-28 NOTE — Progress Notes (Signed)
   NEXPLANON REMOVAL AND RE-INSERTION Patient name: Sandra Glover MRN [id number]  Date of birth: [birth year] Subjective Findings:   Sandra Glover is a 34 y.o. G15P1102 Caucasian female being seen today for Nexplanon removal and re-insertion. Her Nexplanon was placed 09/25/17.   No LMP recorded. Patient has had an implant. Last pap11/12/18. Results were:  normal  Risks/benefits/side effects of Nexplanon have been discussed and her questions have been answered.  Specifically, a failure rate of 12/998 has been reported, with an increased failure rate if pt takes St. John's Wort and/or antiseizure medicaitons.  She is aware of the common side effect of irregular bleeding, which the incidence of decreases over time. Signed copy of informed consent in chart.  Depression screen [facility] 2/9 10/16/2017 07/31/2014  Decreased Interest 3 0  Down, Depressed, Hopeless 3 0  PHQ - 2 Score 6 0  Altered sleeping 1 -  Tired, decreased energy 3 -  Change in appetite 1 -  Feeling bad or failure about yourself  1 -  Trouble concentrating 3 -  Moving slowly or fidgety/restless 1 -  Suicidal thoughts 0 -  PHQ-9 Score 16 -  Difficult doing work/chores Very difficult -  Some encounter information is confidential and restricted. Go to Review Flowsheets activity to see all data.    Pertinent History Reviewed:   Reviewed past medical,surgical, social, obstetrical and family history.  Reviewed problem list, medications and allergies. Objective Findings & Procedure:    Vitals:   09/28/20 1607  BP: 111/76  Pulse: 87  Weight: 172 lb 6.4 oz (78.2 kg)  Height: 5\' 3"  (1.6 m)  Body mass index is 30.54 kg/m.  No results found for this or any previous visit (from the past 24 hour(s)).   Time out was performed.  Nexplanon site identified.  Area prepped in usual sterile fashon. Two cc's of 2% lidocaine was used to anesthetize the area. A small stab incision was made right beside the implant on the distal  portion.  The Nexplanon rod was grasped using hemostats and removed intact without difficulty.  The area was cleansed again with betadine and the Nexplanon was inserted approximately 10cm from the medial epicondyle and 3-5cm posterior to the sulcus per manufacturer's recommendations without difficulty.  Steri-strips and a pressure bandage was applied.  There was less than 3 cc blood loss. There were no complications.  The patient tolerated the procedure well. Assessment & Plan:   1) Nexplanon removal & re-insertion She was instructed to keep the area clean and dry, remove pressure bandage in 24 hours, and keep insertion site covered with the steri-strips for 3-5 days.  She was given a card indicating date Nexplanon was inserted and date it needs to be removed.  Follow-up PRN problems.  No orders of the defined types were placed in this encounter.   Follow-up: Return for 1st available, Pap & physical.  CNM, [facility] 09/28/2020 4:43 PM

## 2020-10-08 ENCOUNTER — Other Ambulatory Visit: Payer: PRIVATE HEALTH INSURANCE | Admitting: Women's Health

## 2020-10-13 ENCOUNTER — Telehealth (HOSPITAL_COMMUNITY): Payer: Self-pay | Admitting: Psychiatry

## 2020-10-13 NOTE — Telephone Encounter (Signed)
Called pt to schedule f/u appt, left vm

## 2020-11-04 ENCOUNTER — Other Ambulatory Visit (HOSPITAL_COMMUNITY): Payer: Self-pay | Admitting: Psychiatry

## 2020-11-04 MED ORDER — AMPHETAMINE-DEXTROAMPHETAMINE 30 MG PO TABS: 30.0000 mg | ORAL_TABLET | ORAL | 0 refills | Status: DC

## 2020-11-08 DIAGNOSIS — E669 Obesity, unspecified: Secondary | ICD-10-CM | POA: Insufficient documentation

## 2020-11-08 DIAGNOSIS — E785 Hyperlipidemia, unspecified: Secondary | ICD-10-CM | POA: Insufficient documentation

## 2020-11-10 ENCOUNTER — Telehealth (INDEPENDENT_AMBULATORY_CARE_PROVIDER_SITE_OTHER): Payer: PRIVATE HEALTH INSURANCE | Admitting: Psychiatry

## 2020-11-10 ENCOUNTER — Other Ambulatory Visit: Payer: Self-pay

## 2020-11-10 ENCOUNTER — Encounter (HOSPITAL_COMMUNITY): Payer: Self-pay | Admitting: Psychiatry

## 2020-11-10 DIAGNOSIS — F331 Major depressive disorder, recurrent, moderate: Secondary | ICD-10-CM | POA: Diagnosis not present

## 2020-11-10 DIAGNOSIS — F900 Attention-deficit hyperactivity disorder, predominantly inattentive type: Secondary | ICD-10-CM

## 2020-11-10 MED ORDER — FLUOXETINE HCL 40 MG PO CAPS: ORAL_CAPSULE | ORAL | 2 refills | Status: DC

## 2020-11-10 MED ORDER — AMPHETAMINE-DEXTROAMPHETAMINE 30 MG PO TABS: 30.0000 mg | ORAL_TABLET | ORAL | 0 refills | Status: DC

## 2020-11-10 MED ORDER — AMPHETAMINE-DEXTROAMPHETAMINE 30 MG PO TABS
30.0000 mg | ORAL_TABLET | ORAL | 0 refills | Status: DC
Start: 2020-11-10 — End: 2021-03-17

## 2020-11-10 MED ORDER — ALPRAZOLAM 1 MG PO TABS: 1.0000 mg | ORAL_TABLET | Freq: Two times a day (BID) | ORAL | 2 refills | Status: DC | PRN

## 2020-11-10 MED ORDER — AMPHETAMINE-DEXTROAMPHETAMINE 30 MG PO TABS
30.0000 mg | ORAL_TABLET | Freq: Every day | ORAL | 0 refills | Status: DC
Start: 2020-11-10 — End: 2021-03-17

## 2020-11-10 NOTE — Progress Notes (Signed)
Virtual Visit via Video Note  I connected with Sandra Glover on 11/10/20 at 11:20 AM EST by a video enabled telemedicine application and verified that I am speaking with the correct person using two identifiers.  Location: Patient: home Provider: office   I discussed the limitations of evaluation and management by telemedicine and the availability of in person appointments. The patient expressed understanding and agreed to proceed.    I discussed the assessment and treatment plan with the patient. The patient was provided an opportunity to ask questions and all were answered. The patient agreed with the plan and demonstrated an understanding of the instructions.   The patient was advised to call back or seek an in-person evaluation if the symptoms worsen or if the condition fails to improve as anticipated.  I provided 15 minutes of non-face-to-face time during this encounter.   Sandra Ruder, MD  [facility] MD/PA/NP OP Progress Note  11/10/2020 11:27 AM Sandra Glover  MRN:  [id number]  Chief Complaint:  Chief Complaint    Depression; Anxiety; ADD; Follow-up     HPI: This patient is a 34 year old married white female who lives with her husband and 2 children in [place].  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  The patient returns for follow-up after 3 months.  For the most part she is doing okay.  However she is somewhat anxious because her husband had developed a tumor in his axilla area and it seems to be growing.  He now is to have an MRI surgery and a biopsy.  They are worried that it could be cancer but there is no definitive diagnosis at this point.  She is trying to take this a day at a time.  Generally her mood has been good she has been sleeping well and she is doing well focused with the Adderall.  She denies suicidal ideation Visit Diagnosis:    ICD-10-CM   1. Major depressive disorder, recurrent episode, moderate (HCC)  F33.1   2. Attention  deficit hyperactivity disorder (ADHD), predominantly inattentive type  F90.0     Past Psychiatric History: long term outpatient treatment  Past Medical History:  Past Medical History:  Diagnosis Date  . Anxiety   . Anxiety   . Depression   . Gestational diabetes mellitus, antepartum    gestational only  . History of anemia 2013  . History of depression    states stopped med. in 2013  . 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 Hypertension   . Osteopenia   . Pertussis   . Postcoital bleeding 07/14/2015  . Sinus infection 01/25/2013   started antibiotic 01/24/2013 x 10 days; nasal congestion, clear drainage from nose  . Tonsillar and adenoid hypertrophy 01/2013  . Vaginal irritation 03/25/2014   Mild yeast but has history of yeast, will not treat til after first trimester,try yogurt    Past Surgical History:  Procedure Laterality Date  . CHOLECYSTECTOMY  04/23/2010   laparoscopic  . COLONOSCOPY WITH PROPOFOL N/A 05/17/2016   Procedure: COLONOSCOPY WITH PROPOFOL;  Surgeon: West Bali, MD;  [facility];  Service: Endoscopy;  Laterality: N/A;  1000  . HEMORRHOID BANDING N/A 05/17/2016   Procedure: HEMORRHOID BANDING;  Surgeon: West Bali, MD;  Location: [facility];  Service: Endoscopy;  Laterality: N/A;  . HEMORRHOID SURGERY N/A 06/08/2016   Procedure: SIMPLE HEMORRHOIDECTOMY;  Surgeon: Ancil Linsey, MD;  Location: [facility];  Service: General;  Laterality: N/A;  . PARTIAL NEPHRECTOMY Right age 28 mos.  . TONSILLECTOMY AND ADENOIDECTOMY N/A 01/29/2013   Procedure: TONSILLECTOMY AND ADENOIDECTOMY;  Surgeon: Darletta Moll, MD;  Location: [facility];  Service: ENT;  Laterality: N/A;    Family Psychiatric History: see below  Family History:  Family History  Problem Relation Age of Onset  . COPD Mother   . Depression Mother   . Anxiety disorder Mother   . Hypertension Mother   . Irritable bowel syndrome Mother    . Arthritis Mother   . Skin cancer Mother   . Thyroid cancer Mother   . Ovarian cancer Maternal Aunt   . Ovarian cancer Maternal Grandmother   . Cancer Maternal Grandmother        bladder, kidney  . Hypertension Father   . GER disease Father   . Bipolar disorder Paternal Uncle   . Anxiety disorder Cousin   . Drug abuse Cousin   . Bipolar disorder Cousin   . Colon cancer Neg Hx     Social History:  Social History   Socioeconomic History  . Marital status: Married    Spouse name: Not on file  . Number of children: Not on file  . Years of education: Not on file  . Highest education level: Not on file  Occupational History  . Not on file  Tobacco Use  . Smoking status: Never Smoker  . Smokeless tobacco: Never Used  . Tobacco comment: Never smoked  Vaping Use  . Vaping Use: Never used  Substance and Sexual Activity  . Alcohol use: Yes    Alcohol/week: 0.0 standard drinks    Comment: socially  . Drug use: No  . Sexual activity: Yes    Birth control/protection: Implant    Comment: nexplanon implant  Other Topics Concern  . Not on file  Social History Narrative  . Not on file   Social Determinants of Health   Financial Resource Strain:   . Difficulty of Paying Living Expenses: Not on file  Food Insecurity:   . Worried About Programme researcher, broadcasting/film/video in the Last Year: Not on file  . Ran Out of Food in the Last Year: Not on file  Transportation Needs:   . Lack of Transportation (Medical): Not on file  . Lack of Transportation (Non-Medical): Not on file  Physical Activity:   . Days of Exercise per Week: Not on file  . Minutes of Exercise per Session: Not on file  Stress:   . Feeling of Stress : Not on file  Social Connections:   . Frequency of Communication with Friends and Family: Not on file  . Frequency of Social Gatherings with Friends and Family: Not on file  . Attends Religious Services: Not on file  . Active Member of Clubs or Organizations: Not on file  .  Attends Banker Meetings: Not on file  . Marital Status: Not on file    Allergies:  Allergies  Allergen Reactions  . Lortab [Hydrocodone-Acetaminophen] Nausea And Vomiting  . Morphine And Related Rash    Metabolic Disorder Labs: No results found for: HGBA1C, MPG No results found for: PROLACTIN No results found for: CHOL, TRIG, HDL, CHOLHDL, VLDL, LDLCALC Lab Results  Component Value Date   TSH 2.553 10/13/2017    Therapeutic Level Labs: No results found for: LITHIUM No results found for: VALPROATE No components found for:  CBMZ  Current Medications: Current Outpatient Medications  Medication Sig Dispense Refill  . ALPRAZolam (XANAX) 1 MG tablet Take 1 tablet (1 mg total) by mouth 2 (two) times daily as needed for anxiety. 60 tablet 2  . amphetamine-dextroamphetamine (ADDERALL) 30 MG tablet Take 1 tablet by mouth every morning. 30 tablet 0  . amphetamine-dextroamphetamine (ADDERALL) 30 MG tablet Take 1 tablet by mouth every morning. 30 tablet 0  . amphetamine-dextroamphetamine (ADDERALL) 30 MG tablet Take 1 tablet by mouth daily. 30 tablet 0  . calcium-vitamin D (OSCAL WITH D) 500-200 MG-UNIT tablet Take 1 tablet by mouth.    . dicyclomine (BENTYL) 10 MG capsule Take 1 capsule (10 mg total) by mouth 4 (four) times daily -  before meals and at bedtime. 90 capsule 0  . etonogestrel (NEXPLANON) 68 MG IMPL implant 1 each by Subdermal route once.    Marland Kitchen. FLUoxetine (PROZAC) 40 MG capsule TAKE ONE CAPSULE BY MOUTH EVERY DAY 30 capsule 2  . lisinopril-hydrochlorothiazide (ZESTORETIC) 20-12.5 MG tablet Take 1 tablet by mouth daily.    . megestrol (MEGACE) 40 MG tablet Take 3 x 5 days then 2 x 5 days then 1 daily (Patient not taking: Reported on 09/28/2020) 45 tablet 1  . meloxicam (MOBIC) 7.5 MG tablet TAKE 1 TABLET BY MOUTH DAILY FOR NECK PAIN AND HEADACHE. MAY INCREASE TO 2 TABLEST IF NOT EFFECTIVE    . omeprazole (PRILOSEC) 40 MG capsule TAKE 1 CAPSULE(40 MG) BY MOUTH DAILY  90 capsule 1   No current facility-administered medications for this visit.     Musculoskeletal: Strength & Muscle Tone: within normal limits Gait & Station: normal Patient leans: N/A  Psychiatric Specialty Exam: Review of Systems  Psychiatric/Behavioral: The patient is nervous/anxious.   All other systems reviewed and are negative.   There were no vitals taken for this visit.There is no height or weight on file to calculate BMI.  General Appearance: Casual and Fairly Groomed  Eye Contact:  Good  Speech:  Clear and Coherent  Volume:  Normal  Mood:  Euthymic  Affect:  Appropriate and Congruent  Thought Process:  Goal Directed  Orientation:  Full (Time, Place, and Person)  Thought Content: Rumination   Suicidal Thoughts:  No  Homicidal Thoughts:  No  Memory:  Immediate;   Good Recent;   Good Remote;   Good  Judgement:  Good  Insight:  Good  Psychomotor Activity:  Normal  Concentration:  Concentration: Good and Attention Span: Good  Recall:  Good  Fund of Knowledge: Good  Language: Good  Akathisia:  No  Handed:  Right  AIMS (if indicated): not done  Assets:  Communication Skills Desire for Improvement Physical Health Resilience Social Support Talents/Skills  ADL's:  Intact  Cognition: WNL  Sleep:  Good   Screenings: GAD-7     Counselor from 09[facility]27/2018 [facility]  Total GAD-7 Score 18    PHQ2-9     Counselor from 09/27/2018 in [facility] [facility] Counselor from 10/13/2017 in [facility] Nutrition from 07/30/2014 in [facility]  PHQ-2 Total Score 6 6 6  (P)  0  PHQ-9 Total Score 21 16 21  (P)  --       Assessment and Plan: This patient is a 34 year old female with a history of depression anxiety and ADD.  Despite the recent stressors she is doing well on her  current regimen.  She will continue Prozac 40 mg daily for depression,  Xanax 1 mg twice daily as needed for anxiety and Adderall 30 mg every morning for focus.  She will return to see me in 3 months   Sandra Ruder, MD 11/10/2020, 11:27 AM

## 2021-01-11 ENCOUNTER — Other Ambulatory Visit: Payer: PRIVATE HEALTH INSURANCE | Admitting: Advanced Practice Midwife

## 2021-01-12 ENCOUNTER — Telehealth (HOSPITAL_COMMUNITY): Payer: Self-pay | Admitting: Psychiatry

## 2021-01-12 NOTE — Telephone Encounter (Signed)
Called to schedule f/u appt left VM

## 2021-02-08 ENCOUNTER — Telehealth (HOSPITAL_COMMUNITY): Payer: PRIVATE HEALTH INSURANCE | Admitting: Psychiatry

## 2021-02-08 ENCOUNTER — Other Ambulatory Visit: Payer: Self-pay

## 2021-02-15 DIAGNOSIS — F3112 Bipolar disorder, current episode manic without psychotic features, moderate: Secondary | ICD-10-CM | POA: Insufficient documentation

## 2021-02-27 ENCOUNTER — Other Ambulatory Visit: Payer: Self-pay | Admitting: Gastroenterology

## 2021-02-27 DIAGNOSIS — K219 Gastro-esophageal reflux disease without esophagitis: Secondary | ICD-10-CM

## 2021-02-27 DIAGNOSIS — R197 Diarrhea, unspecified: Secondary | ICD-10-CM

## 2021-03-01 NOTE — Telephone Encounter (Signed)
Rx was filled 6 months ago and recommended OV for additional refills at that time. Please let her know I will send in a 3 month supply, but she will need an OV for follow-up in order to receive any additional refills.

## 2021-03-02 NOTE — Telephone Encounter (Signed)
Phoned and advised the pt that her Rx was filled for a 3 month supply and in order to get more refills she has to make an appt to be seen. Pt agreed to do this.

## 2021-03-05 ENCOUNTER — Other Ambulatory Visit (HOSPITAL_COMMUNITY): Payer: Self-pay | Admitting: Psychiatry

## 2021-03-05 ENCOUNTER — Telehealth (HOSPITAL_COMMUNITY): Payer: Self-pay | Admitting: *Deleted

## 2021-03-05 MED ORDER — AMPHETAMINE-DEXTROAMPHETAMINE 30 MG PO TABS: 30.0000 mg | ORAL_TABLET | ORAL | 0 refills | Status: DC

## 2021-03-05 NOTE — Telephone Encounter (Signed)
sent 

## 2021-03-05 NOTE — Telephone Encounter (Signed)
Patient called stating she would like refills for her Adderall. Per pt she tried joining for last visit but it was saying she was the only one on the visit. Patient f/u visit is for April 13. [facility] in [place].

## 2021-03-08 NOTE — Telephone Encounter (Signed)
LMOM to inform patient that script was sent to pharmacy per her request. Office number left on voicemail

## 2021-03-17 ENCOUNTER — Other Ambulatory Visit (HOSPITAL_COMMUNITY): Payer: Self-pay | Admitting: Psychiatry

## 2021-03-17 ENCOUNTER — Encounter (HOSPITAL_COMMUNITY): Payer: Self-pay | Admitting: Psychiatry

## 2021-03-17 ENCOUNTER — Telehealth (HOSPITAL_COMMUNITY): Payer: Self-pay | Admitting: *Deleted

## 2021-03-17 ENCOUNTER — Telehealth (INDEPENDENT_AMBULATORY_CARE_PROVIDER_SITE_OTHER): Payer: PRIVATE HEALTH INSURANCE | Admitting: Psychiatry

## 2021-03-17 ENCOUNTER — Other Ambulatory Visit: Payer: Self-pay

## 2021-03-17 DIAGNOSIS — F331 Major depressive disorder, recurrent, moderate: Secondary | ICD-10-CM | POA: Diagnosis not present

## 2021-03-17 DIAGNOSIS — F900 Attention-deficit hyperactivity disorder, predominantly inattentive type: Secondary | ICD-10-CM

## 2021-03-17 MED ORDER — ALPRAZOLAM 1 MG PO TABS: 1.0000 mg | ORAL_TABLET | Freq: Two times a day (BID) | ORAL | 2 refills | Status: DC | PRN

## 2021-03-17 MED ORDER — FLUOXETINE HCL 40 MG PO CAPS: ORAL_CAPSULE | ORAL | 2 refills | Status: DC

## 2021-03-17 MED ORDER — AMPHETAMINE-DEXTROAMPHETAMINE 30 MG PO TABS: 30.0000 mg | ORAL_TABLET | Freq: Every day | ORAL | 0 refills | Status: DC

## 2021-03-17 MED ORDER — AMPHETAMINE-DEXTROAMPHETAMINE 30 MG PO TABS: 30.0000 mg | ORAL_TABLET | ORAL | 0 refills | Status: DC

## 2021-03-17 MED ORDER — QUETIAPINE FUMARATE 25 MG PO TABS
25.0000 mg | ORAL_TABLET | Freq: Every day | ORAL | 2 refills | Status: DC
Start: 2021-03-17 — End: 2021-05-19

## 2021-03-17 MED ORDER — ARIPIPRAZOLE 2 MG PO TABS: 2.0000 mg | ORAL_TABLET | Freq: Every day | ORAL | 2 refills | Status: DC

## 2021-03-17 NOTE — Telephone Encounter (Signed)
Informed patient with what provider stated and she verbalized understanding.  

## 2021-03-17 NOTE — Telephone Encounter (Signed)
Tell her seroquel sent in

## 2021-03-17 NOTE — Progress Notes (Signed)
Virtual Visit via Video Note  I connected with Sandra Glover on 03/17/21 at 11:20 AM EDT by a video enabled telemedicine application and verified that I am speaking with the correct person using two identifiers.  Location: Patient: home Provider: home   I discussed the limitations of evaluation and management by telemedicine and the availability of in person appointments. The patient expressed understanding and agreed to proceed.     I discussed the assessment and treatment plan with the patient. The patient was provided an opportunity to ask questions and all were answered. The patient agreed with the plan and demonstrated an understanding of the instructions.   The patient was advised to call back or seek an in-person evaluation if the symptoms worsen or if the condition fails to improve as anticipated.  I provided 15 minutes of non-face-to-face time during this encounter.   [name], MD  [facility] MD/PA/NP OP Progress Note  03/17/2021 11:57 AM Sandra Glover  MRN:  [id number]  Chief Complaint:  Chief Complaint    Anxiety; Depression; Follow-up     HPI: This patient is a 35 year old married white female who lives with her husband and two children in [place]. 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  Patient returns for follow-up after about 9 months.  She states that she has noticed that she is having these periods of what she calls "manic episodes.  She gets very happy almost euphoric her thoughts race and she talks nonstop.  This last for several hours and then she dips down into is a moderate depression.  She does not get suicidal.  This is now happening several times a week.  She used to be on Abilify which helped stabilize this but at her new job it is going to cost her $300 a month for the Abilify.  I told her I would try to send this back through and do what we can to get it for her if not we will have to try different mood stabilizer.  It does  sound as if she has symptoms congruent with bipolar 2.  She states that depression is manageable and the Prozac helps.  Xanax continues to help with anxiety.  Most the time she is sleeping well and the Adderall continues to help with focus Visit Diagnosis:    ICD-10-CM   1. Major depressive disorder, recurrent episode, moderate (HCC)  F33.1   2. Attention deficit hyperactivity disorder (ADHD), predominantly inattentive type  F90.0     Past Psychiatric History: Long-term outpatient treatment  Past Medical History:  Past Medical History:  Diagnosis Date  . Anxiety   . Anxiety   . Depression   . Gestational diabetes mellitus, antepartum    gestational only  . History of anemia 2013  . History of depression    states stopped med. in 2013  . 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 Hypertension   . Osteopenia   . Pertussis   . Postcoital bleeding 07/14/2015  . Sinus infection 01/25/2013   started antibiotic 01/24/2013 x 10 days; nasal congestion, clear drainage from nose  . Tonsillar and adenoid hypertrophy 01/2013  . Vaginal irritation 03/25/2014   Mild yeast but has history of yeast, will not treat til after first trimester,try yogurt    Past Surgical History:  Procedure Laterality Date  . CHOLECYSTECTOMY  04/23/2010   laparoscopic  . COLONOSCOPY WITH PROPOFOL N/A 05/17/2016  Procedure: COLONOSCOPY WITH PROPOFOL;  Surgeon: West BaliSandi L Fields, MD;  [facility];  Service: Endoscopy;  Laterality: N/A;  1000  . HEMORRHOID BANDING N/A 05/17/2016   Procedure: HEMORRHOID BANDING;  Surgeon: West BaliSandi L Fields, MD;  [facility];  Service: Endoscopy;  Laterality: N/A;  . HEMORRHOID SURGERY N/A 06/08/2016   Procedure: SIMPLE HEMORRHOIDECTOMY;  Surgeon: Ancil LinseyJason Evan Davis, MD;  Location: [facility];  Service: General;  Laterality: N/A;  . PARTIAL NEPHRECTOMY Right age 33 mos.  . TONSILLECTOMY AND ADENOIDECTOMY N/A 01/29/2013   Procedure:  TONSILLECTOMY AND ADENOIDECTOMY;  Surgeon: Darletta MollSui W Teoh, MD;  Location: [facility];  Service: ENT;  Laterality: N/A;    Family Psychiatric History: see below  Family History:  Family History  Problem Relation Age of Onset  . COPD Mother   . Depression Mother   . Anxiety disorder Mother   . Hypertension Mother   . Irritable bowel syndrome Mother   . Arthritis Mother   . Skin cancer Mother   . Thyroid cancer Mother   . Ovarian cancer Maternal Aunt   . Ovarian cancer Maternal Grandmother   . Cancer Maternal Grandmother        bladder, kidney  . Hypertension Father   . GER disease Father   . Bipolar disorder Paternal Uncle   . Anxiety disorder Cousin   . Drug abuse Cousin   . Bipolar disorder Cousin   . Colon cancer Neg Hx     Social History:  Social History   Socioeconomic History  . Marital status: Married    Spouse name: Not on file  . Number of children: Not on file  . Years of education: Not on file  . Highest education level: Not on file  Occupational History  . Not on file  Tobacco Use  . Smoking status: Never Smoker  . Smokeless tobacco: Never Used  . Tobacco comment: Never smoked  Vaping Use  . Vaping Use: Never used  Substance and Sexual Activity  . Alcohol use: Yes    Alcohol/week: 0.0 standard drinks    Comment: socially  . Drug use: No  . Sexual activity: Yes    Birth control/protection: Implant    Comment: nexplanon implant  Other Topics Concern  . Not on file  Social History Narrative  . Not on file   Social Determinants of Health   Financial Resource Strain: Not on file  Food Insecurity: Not on file  Transportation Needs: Not on file  Physical Activity: Not on file  Stress: Not on file  Social Connections: Not on file    Allergies:  Allergies  Allergen Reactions  . Lortab [Hydrocodone-Acetaminophen] Nausea And Vomiting  . Morphine And Related Rash    Metabolic Disorder Labs: No results found for: HGBA1C, MPG No  results found for: PROLACTIN No results found for: CHOL, TRIG, HDL, CHOLHDL, VLDL, LDLCALC Lab Results  Component Value Date   TSH 2.553 10/13/2017    Therapeutic Level Labs: No results found for: LITHIUM No results found for: VALPROATE No components found for:  CBMZ  Current Medications: Current Outpatient Medications  Medication Sig Dispense Refill  . ALPRAZolam (XANAX) 1 MG tablet Take 1 tablet (1 mg total) by mouth 2 (two) times daily as needed for anxiety. 60 tablet 2  . amphetamine-dextroamphetamine (ADDERALL) 30 MG tablet Take 1 tablet by mouth every morning. 30 tablet 0  . amphetamine-dextroamphetamine (ADDERALL) 30 MG tablet Take 1 tablet by mouth every morning. 30 tablet 0  .  amphetamine-dextroamphetamine (ADDERALL) 30 MG tablet Take 1 tablet by mouth daily. 30 tablet 0  . ARIPiprazole (ABILIFY) 2 MG tablet Take 1 tablet (2 mg total) by mouth daily. 30 tablet 2  . calcium-vitamin D (OSCAL WITH D) 500-200 MG-UNIT tablet Take 1 tablet by mouth.    . dicyclomine (BENTYL) 10 MG capsule Take 1 capsule (10 mg total) by mouth 4 (four) times daily -  before meals and at bedtime. 90 capsule 0  . etonogestrel (NEXPLANON) 68 MG IMPL implant 1 each by Subdermal route once.    Marland Kitchen FLUoxetine (PROZAC) 40 MG capsule TAKE ONE CAPSULE BY MOUTH EVERY DAY 30 capsule 2  . lisinopril-hydrochlorothiazide (ZESTORETIC) 20-12.5 MG tablet Take 1 tablet by mouth daily.    . megestrol (MEGACE) 40 MG tablet Take 3 x 5 days then 2 x 5 days then 1 daily (Patient not taking: Reported on 09/28/2020) 45 tablet 1  . meloxicam (MOBIC) 7.5 MG tablet TAKE 1 TABLET BY MOUTH DAILY FOR NECK PAIN AND HEADACHE. MAY INCREASE TO 2 TABLEST IF NOT EFFECTIVE    . omeprazole (PRILOSEC) 40 MG capsule TAKE 1 CAPSULE(40 MG) BY MOUTH DAILY 30 capsule 2   No current facility-administered medications for this visit.     Musculoskeletal: Strength & Muscle Tone: within normal limits Gait & Station: normal Patient leans:  N/A  Psychiatric Specialty Exam: Review of Systems  Psychiatric/Behavioral: Positive for dysphoric mood.  All other systems reviewed and are negative.   There were no vitals taken for this visit.There is no height or weight on file to calculate BMI.  General Appearance: Casual and Fairly Groomed  Eye Contact:  Good  Speech:  Clear and Coherent  Volume:  Normal  Mood:  Anxious  Affect:  Labile  Thought Process:  Goal Directed  Orientation:  Full (Time, Place, and Person)  Thought Content: Rumination   Suicidal Thoughts:  No  Homicidal Thoughts:  No  Memory:  Immediate;   Good Recent;   Good Remote;   Good  Judgement:  Good  Insight:  Good  Psychomotor Activity:  Normal  Concentration:  Concentration: Fair and Attention Span: Fair  Recall:  Good  Fund of Knowledge: Good  Language: Good  Akathisia:  No  Handed:  Right  AIMS (if indicated): not done  Assets:  Communication Skills Desire for Improvement Physical Health Resilience Social Support Talents/Skills Vocational/Educational  ADL's:  Intact  Cognition: WNL  Sleep:  Fair   Screenings: GAD-7   Advertising copywriter from 09/27/2018 [facility]  Total GAD-7 Score 18    PHQ2-9   Flowsheet Row Video Visit from 03/17/2021 in [facility] Counselor from 09/27/2018 in [facility] Office Visit from 10/16/2017 in [facility] Counselor from 10/13/2017 in [facility] Nutrition from 07/30/2014 in [facility]  PHQ-2 Total Score 1 6 6 6  (P)  0  PHQ-9 Total Score -- 21 16 21  (P)  --    Flowsheet Row Video Visit from 03/17/2021 in [facility]  C-SSRS RISK CATEGORY No Risk       Assessment and Plan: This patient is a 35 year old female with a history of depression anxiety and  ADD.  However what she is describing now is more congruent with bipolar 2 disorder given that she has moderate depression with episodes of short lasting manic spells.  We will try to add Abilify 2 mg back to her  regimen.  This will be for mood stabilization.  She will continue Prozac 40 mg daily for depression, Xanax 1 mg twice daily as needed for anxiety and Adderall 30 mg every morning for focus.  She will return to see me in 6 weeks.  She will call us if she cannot get the Abilify approved   [name], MD 03/17/2021, 11:57 AM

## 2021-03-17 NOTE — Telephone Encounter (Signed)
Patient called stating provider told her to call office back if her anti depressant medication was too expensive. Per pt pharmacy ran it with her insurance and even tried to use the discount card and its still $277 and she can not afford that. Per pt she discussed with provider that provider informed her that if it was too expensive, provider was going to call in another antidepressant.

## 2021-03-18 ENCOUNTER — Encounter (HOSPITAL_COMMUNITY): Payer: Self-pay

## 2021-03-23 DIAGNOSIS — K429 Umbilical hernia without obstruction or gangrene: Secondary | ICD-10-CM | POA: Insufficient documentation

## 2021-03-30 DIAGNOSIS — M79601 Pain in right arm: Secondary | ICD-10-CM | POA: Insufficient documentation

## 2021-04-07 ENCOUNTER — Other Ambulatory Visit: Payer: Self-pay

## 2021-04-07 ENCOUNTER — Encounter: Payer: Self-pay | Admitting: Orthopedic Surgery

## 2021-04-07 ENCOUNTER — Ambulatory Visit (INDEPENDENT_AMBULATORY_CARE_PROVIDER_SITE_OTHER): Payer: PRIVATE HEALTH INSURANCE | Admitting: Orthopedic Surgery

## 2021-04-07 VITALS — BP 127/86 | HR 112 | Ht 63.0 in | Wt 172.0 lb

## 2021-04-07 DIAGNOSIS — G5601 Carpal tunnel syndrome, right upper limb: Secondary | ICD-10-CM

## 2021-04-07 DIAGNOSIS — G5621 Lesion of ulnar nerve, right upper limb: Secondary | ICD-10-CM

## 2021-04-07 DIAGNOSIS — M7021 Olecranon bursitis, right elbow: Secondary | ICD-10-CM | POA: Diagnosis not present

## 2021-04-07 MED ORDER — GABAPENTIN 100 MG PO CAPS
100.0000 mg | ORAL_CAPSULE | Freq: Three times a day (TID) | ORAL | 0 refills | Status: DC
Start: 2021-04-07 — End: 2021-06-17

## 2021-04-07 NOTE — Progress Notes (Signed)
New Patient Visit  Assessment: Sandra Glover is a 35 y.o. female with the following: 1. Cubital tunnel syndrome on right - mild symptoms 2. Carpal tunnel syndrome, right upper limb - mild symptoms 3. Right olecranon bursitis  Plan: Patient has multiple complaints in her right upper extremity.  These have been lingering for a while, but overall her symptoms are mild.  She has numbness and tingling in her right hand, with physical exam findings consistent with both cubital tunnel, as well as carpal tunnel.  However, her strength is very good.  She has been using an elbow brace, which is improving some of the irritation of the ulnar nerve.  In addition, she has been using a strap on her wrist, which does help her with work, but I believe that this is exacerbating some of her symptoms as well.  We will fit her with a cock-up wrist splint and I have recommended that she wear this at nighttime only.  She also has tenderness to palpation over the tip of the olecranon, which is consistent with olecranon bursitis.  There is not significant swelling in this area, but is tender nonetheless.  She can try an elbow pad to prevent her from bumping and worsening her pain.  Otherwise, I recommended Voltaren gel to help improve the symptoms.  She will also try gabapentin for the nerve pain.  All questions were answered and she is amenable to this plan.  Follow-up as needed.   Follow-up: Return if symptoms worsen or fail to improve.  Subjective:  Chief Complaint  Patient presents with  . Arm Injury    Pt jarred rt lower arm in a sliding door 3 months ago. States she's lost her grip, had tightness in fingers, numbness and tingling in hand. Pain did not start until 3 wks after injury.     History of Present Illness: Sandra Glover is a 35 y.o. RHD female who has been referred to clinic today by Claiborne Rigg, FNP for evaluation of right arm pain.  She states that she got her arm caught in a heavy sliding  door approximately 3 months ago.  She had initial pain, which gradually improved.  However, few weeks later, she started to experience multiple problems in her right upper extremity.  Since then, she has numbness and tingling in her right hand.  This causes some cramping as well.  She also is tenderness to palpation in her right elbow.  She describes the pain as burning type pain.  Some of the pain radiates in her forearm.  She has been using a brace on her elbow, which is improving some of the numbness in her hand.  She also uses a strap on her right wrist, especially while at work, which has improved some of her symptoms, but she still has cramping in her right hand.   Review of Systems: No fevers or chills + numbness and tingling No chest pain No shortness of breath No bowel or bladder dysfunction No GI distress No headaches   Medical History:  Past Medical History:  Diagnosis Date  . Anxiety   . Anxiety   . Depression   . Gestational diabetes mellitus, antepartum    gestational only  . History of anemia 2013  . History of depression    states stopped med. in 2013  . History of partial nephrectomy age 15 mos.   right - due to kidney infection  . Hypertension   . Osteopenia   . Pertussis   .  Postcoital bleeding 07/14/2015  . Sinus infection 01/25/2013   started antibiotic 01/24/2013 x 10 days; nasal congestion, clear drainage from nose  . Tonsillar and adenoid hypertrophy 01/2013  . Vaginal irritation 03/25/2014   Mild yeast but has history of yeast, will not treat til after first trimester,try yogurt    Past Surgical History:  Procedure Laterality Date  . CHOLECYSTECTOMY  04/23/2010   laparoscopic  . COLONOSCOPY WITH PROPOFOL N/A 05/17/2016   Procedure: COLONOSCOPY WITH PROPOFOL;  Surgeon: West Bali, MD;  [facility];  Service: Endoscopy;  Laterality: N/A;  1000  . HEMORRHOID BANDING N/A 05/17/2016   Procedure: HEMORRHOID BANDING;  Surgeon: West Bali, MD;   [facility];  Service: Endoscopy;  Laterality: N/A;  . HEMORRHOID SURGERY N/A 06/08/2016   Procedure: SIMPLE HEMORRHOIDECTOMY;  Surgeon: Ancil Linsey, MD;  Location: [facility];  Service: General;  Laterality: N/A;  . PARTIAL NEPHRECTOMY Right age 11 mos.  . TONSILLECTOMY AND ADENOIDECTOMY N/A 01/29/2013   Procedure: TONSILLECTOMY AND ADENOIDECTOMY;  Surgeon: Darletta Moll, MD;  Location: [facility];  Service: ENT;  Laterality: N/A;    Family History  Problem Relation Age of Onset  . COPD Mother   . Depression Mother   . Anxiety disorder Mother   . Hypertension Mother   . Irritable bowel syndrome Mother   . Arthritis Mother   . Skin cancer Mother   . Thyroid cancer Mother   . Ovarian cancer Maternal Aunt   . Ovarian cancer Maternal Grandmother   . Cancer Maternal Grandmother        bladder, kidney  . Hypertension Father   . GER disease Father   . Bipolar disorder Paternal Uncle   . Anxiety disorder Cousin   . Drug abuse Cousin   . Bipolar disorder Cousin   . Colon cancer Neg Hx    Social History   Tobacco Use  . Smoking status: Never Smoker  . Smokeless tobacco: Never Used  . Tobacco comment: Never smoked  Vaping Use  . Vaping Use: Never used  Substance Use Topics  . Alcohol use: Yes    Alcohol/week: 0.0 standard drinks    Comment: socially  . Drug use: No    Allergies  Allergen Reactions  . Lortab [Hydrocodone-Acetaminophen] Nausea And Vomiting  . Morphine And Related Rash    Current Meds  Medication Sig  . ALPRAZolam (XANAX) 1 MG tablet Take 1 tablet (1 mg total) by mouth 2 (two) times daily as needed for anxiety.  Marland Kitchen amphetamine-dextroamphetamine (ADDERALL) 30 MG tablet Take 1 tablet by mouth every morning.  Marland Kitchen amphetamine-dextroamphetamine (ADDERALL) 30 MG tablet Take 1 tablet by mouth every morning.  Marland Kitchen amphetamine-dextroamphetamine (ADDERALL) 30 MG tablet Take 1 tablet by mouth daily.  . calcium-vitamin D (OSCAL WITH D) 500-200 MG-UNIT  tablet Take 1 tablet by mouth.  . dicyclomine (BENTYL) 10 MG capsule Take 1 capsule (10 mg total) by mouth 4 (four) times daily -  before meals and at bedtime.  Marland Kitchen etonogestrel (NEXPLANON) 68 MG IMPL implant 1 each by Subdermal route once.  Marland Kitchen FLUoxetine (PROZAC) 40 MG capsule TAKE ONE CAPSULE BY MOUTH EVERY DAY  . gabapentin (NEURONTIN) 100 MG capsule Take 1 capsule (100 mg total) by mouth 3 (three) times daily.  Marland Kitchen lisinopril-hydrochlorothiazide (ZESTORETIC) 20-12.5 MG tablet Take 1 tablet by mouth daily.  . meloxicam (MOBIC) 7.5 MG tablet TAKE 1 TABLET BY MOUTH DAILY FOR NECK PAIN AND HEADACHE. MAY INCREASE TO 2  TABLEST IF NOT EFFECTIVE  . omeprazole (PRILOSEC) 40 MG capsule TAKE 1 CAPSULE(40 MG) BY MOUTH DAILY  . QUEtiapine (SEROQUEL) 25 MG tablet Take 1 tablet (25 mg total) by mouth at bedtime.    Objective: BP 127/86   Pulse (!) 112   Ht 5\' 3"  (1.6 m)   Wt 172 lb (78 kg)   BMI 30.47 kg/m   Physical Exam:  General: Alert and oriented.  No acute distress. Gait: Normal  Aeration of the right upper extremity demonstrates no deformity.  No atrophy is appreciated.  She has tenderness over the olecranon process.  No bruising in this area.  Minimal swelling.  Positive cubital tunnel compression at the elbow.  Positive Tinel's at the elbow.  Positive carpal tunnel compression.  Positive Tinel's at the wrist.  Grip strength is 5/5, although she is hesitant.  No trigger fingers are identified.  No active triggering in clinic.  Fingers are warm and well-perfused.  Occasional tingling and shooting pains in the clinic.  Sensation is intact, with some tingling on physical exam.    IMAGING: No new imaging obtained today   New Medications:  Meds ordered this encounter  Medications  . gabapentin (NEURONTIN) 100 MG capsule    Sig: Take 1 capsule (100 mg total) by mouth 3 (three) times daily.    Dispense:  90 capsule    Refill:  0      , MD  04/07/2021 9:57 AM

## 2021-04-27 ENCOUNTER — Other Ambulatory Visit: Payer: Self-pay

## 2021-04-27 ENCOUNTER — Emergency Department (HOSPITAL_COMMUNITY)
Admission: EM | Admit: 2021-04-27 | Discharge: 2021-04-27 | Disposition: A | Discharge status: Home / Self Care | Payer: PRIVATE HEALTH INSURANCE | Attending: Emergency Medicine | Admitting: Emergency Medicine

## 2021-04-27 ENCOUNTER — Encounter (HOSPITAL_COMMUNITY): Payer: Self-pay | Admitting: *Deleted

## 2021-04-27 DIAGNOSIS — R197 Diarrhea, unspecified: Secondary | ICD-10-CM | POA: Insufficient documentation

## 2021-04-27 DIAGNOSIS — I10 Essential (primary) hypertension: Secondary | ICD-10-CM | POA: Insufficient documentation

## 2021-04-27 DIAGNOSIS — Z79899 Other long term (current) drug therapy: Secondary | ICD-10-CM | POA: Diagnosis not present

## 2021-04-27 DIAGNOSIS — R109 Unspecified abdominal pain: Secondary | ICD-10-CM | POA: Diagnosis present

## 2021-04-27 LAB — COMPREHENSIVE METABOLIC PANEL
ALT: 22 U/L (ref 0–44)
AST: 26 U/L (ref 15–41)
Albumin: 4.4 g/dL (ref 3.5–5.0)
Alkaline Phosphatase: 118 U/L (ref 38–126)
Anion gap: 10 (ref 5–15)
BUN: 11 mg/dL (ref 6–20)
CO2: 22 mmol/L (ref 22–32)
Calcium: 9 mg/dL (ref 8.9–10.3)
Chloride: 102 mmol/L (ref 98–111)
Creatinine, Ser: 0.88 mg/dL (ref 0.44–1.00)
GFR, Estimated: >60 mL/min (ref >60)
Glucose, Bld: 92 mg/dL (ref 70–99)
Potassium: 3.9 mmol/L (ref 3.5–5.1)
Sodium: 134 mmol/L — ABNORMAL LOW (ref 135–145)
Total Bilirubin: 0.3 mg/dL (ref 0.3–1.2)
Total Protein: 7.3 g/dL (ref 6.5–8.1)

## 2021-04-27 LAB — CBC
HCT: 39.6 % (ref 36.0–46.0)
Hemoglobin: 13.4 g/dL (ref 12.0–15.0)
MCH: 33.2 pg (ref 26.0–34.0)
MCHC: 33.8 g/dL (ref 30.0–36.0)
MCV: 98 fL (ref 80.0–100.0)
Platelets: 275 10*3/uL (ref 150–400)
RBC: 4.04 MIL/uL (ref 3.87–5.11)
RDW: 12.4 % (ref 11.5–15.5)
WBC: 6.4 10*3/uL (ref 4.0–10.5)
nRBC: 0 % (ref 0.0–0.2)

## 2021-04-27 LAB — URINALYSIS, ROUTINE W REFLEX MICROSCOPIC
Bilirubin Urine: NEGATIVE
Glucose, UA: NEGATIVE mg/dL
Ketones, ur: NEGATIVE mg/dL
Leukocytes,Ua: NEGATIVE
Nitrite: NEGATIVE
Protein, ur: NEGATIVE mg/dL
Specific Gravity, Urine: 1.008 (ref 1.005–1.030)
pH: 6 (ref 5.0–8.0)

## 2021-04-27 LAB — LIPASE, BLOOD: Lipase: 35 U/L (ref 11–51)

## 2021-04-27 MED ORDER — ONDANSETRON HCL 4 MG/2ML IJ SOLN
4.0000 mg | INTRAMUSCULAR | Status: AC
  Administered 2021-04-27: 4 mg via INTRAVENOUS
  Filled 2021-04-27: qty 2

## 2021-04-27 MED ORDER — ONDANSETRON HCL 4 MG PO TABS
4.0000 mg | ORAL_TABLET | Freq: Four times a day (QID) | ORAL | 0 refills | Status: DC
Start: 2021-04-27 — End: 2021-11-06

## 2021-04-27 MED ORDER — SODIUM CHLORIDE 0.9 % IV BOLUS
1000.0000 mL | Freq: Once | INTRAVENOUS | Status: AC
  Administered 2021-04-27: 1000 mL via INTRAVENOUS

## 2021-04-27 NOTE — ED Notes (Signed)
Labs obtained by phlebotomy. Pt in bathroom obtaining urine sample at this time. Pt ambulates with steady gait.

## 2021-04-27 NOTE — ED Provider Notes (Signed)
[facility] Provider Note   CSN: [id number] Arrival date & time: 04/27/21  1423     History Chief Complaint  Patient presents with  . Abdominal Pain    Sandra HomansJessica H Glover is a 35 y.o. female.  HPI   This patient is a 35 year old female, she has no significant chronic medical history, she does have a prior history of a partial nephrectomy as well as a cholecystectomy.  She presents with a complaint of diarrhea and some abdominal cramping.  She reports that last week she was sick with "a stomach virus", this was similar to what her daughter had, they both got better in a similar timeframe however on Saturday the patient started to have return of her symptoms with both vomiting and diarrhea.  The vomiting has since resolved but the diarrhea continues and today was explosive watery nonbloody diarrhea.  No recent travel, no recent antibiotics, she has some mid abdominal cramping with it, no fevers or chills, she has been trying to take clear liquids to stay hydrated but feels weak.  Nobody else at home has been sick with similar symptoms.  She feels like this occurred after she ate some food that was at room temperature that was fast food and left out for a long time.  Past Medical History:  Diagnosis Date  . Anxiety   . Anxiety   . Depression   . Gestational diabetes mellitus, antepartum    gestational only  . History of anemia 2013  . History of depression    states stopped med. in 2013  . 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 Hypertension   . Osteopenia   . Pertussis   . Postcoital bleeding 07/14/2015  . Sinus infection 01/25/2013   started antibiotic 01/24/2013 x 10 days; nasal congestion, clear drainage from nose  . Tonsillar and adenoid hypertrophy 01/2013  . Vaginal irritation 03/25/2014   Mild yeast but has history of yeast, will not treat til after first trimester,try yogurt    Patient Active Problem List    Diagnosis Date Noted  . Diarrhea 12/26/2018  . Abdominal pain 12/26/2018  . Nexplanon insertion 09/25/2017  . Hemorrhoids 09/19/2016  . Rectal bleeding 03/24/2016  . Constipation 03/24/2016  . History of preterm delivery 09/09/2014  . Pertussis 07/30/2014  . History of gestational diabetes 07/24/2014  . 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 Depression with anxiety 03/03/2014    Past Surgical History:  Procedure Laterality Date  . CHOLECYSTECTOMY  04/23/2010   laparoscopic  . COLONOSCOPY WITH PROPOFOL N/A 05/17/2016   Procedure: COLONOSCOPY WITH PROPOFOL;  Surgeon: West BaliSandi L Fields, MD;  Location: [facility];  Service: Endoscopy;  Laterality: N/A;  1000  . HEMORRHOID BANDING N/A 05/17/2016   Procedure: HEMORRHOID BANDING;  Surgeon: West BaliSandi L Fields, MD;  Location: [facility];  Service: Endoscopy;  Laterality: N/A;  . HEMORRHOID SURGERY N/A 06/08/2016   Procedure: SIMPLE HEMORRHOIDECTOMY;  Surgeon: Ancil LinseyJason Evan Davis, MD;  Location: [facility];  Service: General;  Laterality: N/A;  . PARTIAL NEPHRECTOMY Right age 33 mos.  . TONSILLECTOMY AND ADENOIDECTOMY N/A 01/29/2013   Procedure: TONSILLECTOMY AND ADENOIDECTOMY;  Surgeon: Darletta MollSui W Teoh, MD;  Location: [facility];  Service: ENT;  Laterality: N/A;     OB History    Gravida  2   Para  2   Term  1   Preterm  1   AB  Living  2     SAB      IAB      Ectopic      Multiple      Live Births  2           Family History  Problem Relation Age of Onset  . COPD Mother   . Depression Mother   . Anxiety disorder Mother   . Hypertension Mother   . Irritable bowel syndrome Mother   . Arthritis Mother   . Skin cancer Mother   . Thyroid cancer Mother   . Ovarian cancer Maternal Aunt   . Ovarian cancer Maternal Grandmother   . Cancer Maternal Grandmother        bladder, kidney  . Hypertension Father   . GER disease Father   . Bipolar disorder Paternal Uncle   . Anxiety disorder Cousin   . Drug  abuse Cousin   . Bipolar disorder Cousin   . Colon cancer Neg Hx     Social History   Tobacco Use  . Smoking status: Never Smoker  . Smokeless tobacco: Never Used  . Tobacco comment: Never smoked  Vaping Use  . Vaping Use: Never used  Substance Use Topics  . Alcohol use: Yes    Alcohol/week: 0.0 standard drinks    Comment: socially  . Drug use: No    Home Medications Prior to Admission medications   Medication Sig Start Date End Date Taking? Authorizing Provider  ondansetron (ZOFRAN) 4 MG tablet Take 1 tablet (4 mg total) by mouth every 6 (six) hours. 04/27/21  Yes Eber Hong, MD  ALPRAZolam Prudy Feeler) 1 MG tablet Take 1 tablet (1 mg total) by mouth 2 (two) times daily as needed for anxiety. 03/17/21 03/17/22  Myrlene Broker, MD  amphetamine-dextroamphetamine (ADDERALL) 30 MG tablet Take 1 tablet by mouth every morning. 03/05/21 03/05/22  Myrlene Broker, MD  amphetamine-dextroamphetamine (ADDERALL) 30 MG tablet Take 1 tablet by mouth every morning. 03/17/21 03/17/22  Myrlene Broker, MD  amphetamine-dextroamphetamine (ADDERALL) 30 MG tablet Take 1 tablet by mouth daily. 03/17/21 03/17/22  Myrlene Broker, MD  calcium-vitamin D (OSCAL WITH D) 500-200 MG-UNIT tablet Take 1 tablet by mouth.    [provider]  dicyclomine (BENTYL) 10 MG capsule Take 1 capsule (10 mg total) by mouth 4 (four) times daily -  before meals and at bedtime. 06/17/19   Anice Paganini, NP  etonogestrel (NEXPLANON) 68 MG IMPL implant 1 each by Subdermal route once.    [provider]  FLUoxetine (PROZAC) 40 MG capsule TAKE ONE CAPSULE BY MOUTH EVERY DAY 03/17/21   Myrlene Broker, MD  gabapentin (NEURONTIN) 100 MG capsule Take 1 capsule (100 mg total) by mouth 3 (three) times daily. 04/07/21   Oliver Barre, MD  lisinopril-hydrochlorothiazide (ZESTORETIC) 20-12.5 MG tablet Take 1 tablet by mouth daily.    [provider]  megestrol (MEGACE) 40 MG tablet Take 3 x 5 days then 2 x 5 days then 1  daily Patient not taking: No sig reported 02/04/20   Cyril Mourning A, NP  meloxicam (MOBIC) 7.5 MG tablet TAKE 1 TABLET BY MOUTH DAILY FOR NECK PAIN AND HEADACHE. MAY INCREASE TO 2 TABLEST IF NOT EFFECTIVE 08/15/19   [provider]  omeprazole (PRILOSEC) 40 MG capsule TAKE 1 CAPSULE(40 MG) BY MOUTH DAILY 03/01/21   Letta Median, PA-C  QUEtiapine (SEROQUEL) 25 MG tablet Take 1 tablet (25 mg total) by mouth at bedtime. 03/17/21 03/17/22  Myrlene Broker, MD    Allergies    Lortab [hydrocodone-acetaminophen] and Morphine and related  Review of Systems   Review of Systems  All other systems reviewed and are negative.   Physical Exam Updated Vital Signs BP 101/67   Pulse 75   Temp 98.4 F (36.9 C) (Oral)   Resp 18   Ht 1.6 m (5\' 3" )   Wt 77.1 kg   SpO2 100%   BMI 30.11 kg/m   Physical Exam Vitals and nursing note reviewed.  Constitutional:      General: She is not in acute distress.    Appearance: She is well-developed.  HENT:     Head: Normocephalic and atraumatic.     Mouth/Throat:     Mouth: Mucous membranes are dry.     Pharynx: No oropharyngeal exudate.  Eyes:     General: No scleral icterus.       Right eye: No discharge.        Left eye: No discharge.     Conjunctiva/sclera: Conjunctivae normal.     Pupils: Pupils are equal, round, and reactive to light.  Neck:     Thyroid: No thyromegaly.     Vascular: No JVD.  Cardiovascular:     Rate and Rhythm: Normal rate and regular rhythm.     Heart sounds: Normal heart sounds. No murmur heard. No friction rub. No gallop.   Pulmonary:     Effort: Pulmonary effort is normal. No respiratory distress.     Breath sounds: Normal breath sounds. No wheezing or rales.  Abdominal:     General: Bowel sounds are normal. There is no distension.     Palpations: Abdomen is soft. There is no mass.     Tenderness: There is abdominal tenderness.     Comments: Minimal mid abdominal tenderness without guarding or  peritoneal signs, no pain McBurney's point, no left lower quadrant pain  Musculoskeletal:        General: No tenderness. Normal range of motion.     Cervical back: Normal range of motion and neck supple.     Right lower leg: No edema.     Left lower leg: No edema.  Lymphadenopathy:     Cervical: No cervical adenopathy.  Skin:    General: Skin is warm and dry.     Findings: No erythema or rash.  Neurological:     Mental Status: She is alert.     Coordination: Coordination normal.     Comments: Normal mental status, able to answer all my questions appropriately and quickly  Psychiatric:        Behavior: Behavior normal.     ED Results / Procedures / Treatments   Labs (all labs ordered are listed, but only abnormal results are displayed) Labs Reviewed  COMPREHENSIVE METABOLIC PANEL - Abnormal; Notable for the following components:      Result Value   Sodium 134 (*)    All other components within normal limits  URINALYSIS, ROUTINE W REFLEX MICROSCOPIC - Abnormal; Notable for the following components:   APPearance CLOUDY (*)    Hgb urine dipstick LARGE (*)    Bacteria, UA RARE (*)    All other components within normal limits  LIPASE, BLOOD  CBC    EKG None  Radiology No results found.  Procedures Procedures   Medications Ordered in ED Medications  sodium chloride 0.9 % bolus 1,000 mL (0 mLs Intravenous Stopped 04/27/21 1854)  ondansetron (ZOFRAN) injection 4 mg (4 mg Intravenous  Given 04/27/21 1721)    ED Course  I have reviewed the triage vital signs and the nursing notes.  Pertinent labs & imaging results that were available during my care of the patient were reviewed by me and considered in my medical decision making (see chart for details).    MDM Rules/Calculators/A&P                          Vital signs are unremarkable, there is no leukocytosis, she is not hypotensive or tachycardic or febrile.  We will make sure that she is not hypokalemic, she will get  some IV fluids and antiemetics, she otherwise appears stable and I doubt that this is a bacterial infectious diarrhea.  Pt improved with the meds / fluids given = and agreeable to be d/c home with return precautions.  Final Clinical Impression(s) / ED Diagnoses Final diagnoses:  Diarrhea, unspecified type    Rx / DC Orders ED Discharge Orders         Ordered    ondansetron (ZOFRAN) 4 MG tablet  Every 6 hours        04/27/21 1907           Eber Hong, MD 04/28/21 1816

## 2021-04-27 NOTE — ED Triage Notes (Signed)
Pt with recent N/V last week, possible food poisoning afterwards a "stomach bug" which daughter had as well.  Pt started to feel better over the weekend.  While at work, diarrhea worse today x 5 today.

## 2021-04-27 NOTE — Discharge Instructions (Signed)
You may take 2 tablets of Imodium immediately then 1 tablet every time he have a bowel movement, maximum of 8 tablets in 1 day.  Take Zofran as needed for nausea, drink lots of fluid  ER for worsening symptoms

## 2021-05-13 ENCOUNTER — Encounter (HOSPITAL_COMMUNITY): Payer: Self-pay | Admitting: Psychiatry

## 2021-05-13 ENCOUNTER — Other Ambulatory Visit: Payer: Self-pay

## 2021-05-13 ENCOUNTER — Ambulatory Visit (INDEPENDENT_AMBULATORY_CARE_PROVIDER_SITE_OTHER): Payer: PRIVATE HEALTH INSURANCE | Admitting: Psychiatry

## 2021-05-13 DIAGNOSIS — F331 Major depressive disorder, recurrent, moderate: Secondary | ICD-10-CM | POA: Diagnosis not present
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
## 2021-05-18 ENCOUNTER — Telehealth (HOSPITAL_COMMUNITY): Payer: Self-pay | Admitting: *Deleted

## 2021-05-18 NOTE — Telephone Encounter (Signed)
Patient is needing refills for all of her medications provider prescribes for her. Patient appt was cancelled due to provider being on medical leave. Message was left to call office to resch appt.  

## 2021-05-19 ENCOUNTER — Other Ambulatory Visit (HOSPITAL_COMMUNITY): Payer: Self-pay | Admitting: Psychiatry

## 2021-05-19 MED ORDER — AMPHETAMINE-DEXTROAMPHETAMINE 30 MG PO TABS: 30.0000 mg | ORAL_TABLET | ORAL | 0 refills | Status: DC

## 2021-05-19 MED ORDER — QUETIAPINE FUMARATE 25 MG PO TABS: 25.0000 mg | ORAL_TABLET | Freq: Every day | ORAL | 2 refills | Status: DC

## 2021-05-19 MED ORDER — ALPRAZOLAM 1 MG PO TABS: 1.0000 mg | ORAL_TABLET | Freq: Two times a day (BID) | ORAL | 2 refills | Status: DC | PRN

## 2021-05-19 MED ORDER — FLUOXETINE HCL 40 MG PO CAPS: ORAL_CAPSULE | ORAL | 2 refills | Status: DC

## 2021-05-19 NOTE — Telephone Encounter (Signed)
sent 

## 2021-05-21 ENCOUNTER — Telehealth (HOSPITAL_COMMUNITY): Payer: Self-pay | Admitting: *Deleted

## 2021-05-21 NOTE — Telephone Encounter (Signed)
Per pt her Seroquel may not be working anymore. Per pt she is having the same symptoms as before. Per pt she don't know if her body has gotten use to it or not. Per pt she would like to discuss with provider in what to do because of the same symptoms she had before starting the medication.

## 2021-05-21 NOTE — Telephone Encounter (Signed)
Opened in Error.

## 2021-05-24 NOTE — Telephone Encounter (Signed)
lmtcb

## 2021-05-27 ENCOUNTER — Ambulatory Visit (INDEPENDENT_AMBULATORY_CARE_PROVIDER_SITE_OTHER): Payer: PRIVATE HEALTH INSURANCE | Admitting: Psychiatry

## 2021-05-27 ENCOUNTER — Telehealth (HOSPITAL_COMMUNITY): Payer: Self-pay | Admitting: Psychiatry

## 2021-05-27 ENCOUNTER — Other Ambulatory Visit: Payer: Self-pay

## 2021-05-27 DIAGNOSIS — F331 Major depressive disorder, recurrent, moderate: Secondary | ICD-10-CM | POA: Diagnosis not present

## 2021-05-27 NOTE — Telephone Encounter (Signed)
-----   Message from Adah Salvage, [place] sent at 05/27/2021 12:18 PM EDT ----- Pt reports increased symptoms and is requesting help regarding medication adjustment as current dose of Seroquel does not seem to be as effective as it was.

## 2021-05-27 NOTE — Telephone Encounter (Signed)
Patient had therapy appointment today and message from therapist was sent to staff stating:  "Pt reports increased symptoms and is requesting help regarding medication adjustment as current dose of Seroquel does not seem to be as effective as it was."  Staff forwarded message above to covering provider to review. Staff did not realized forwarded message did not change over to like it normally does into patient's chart. Staff fixed the issue and this message will be resent to provider so that it is documented in patient's chart.

## 2021-05-27 NOTE — Progress Notes (Signed)
Virtual Visit via Telephone Note  I connected with Sandra Glover on 05/27/21 at 11:10 AM EDT  by telephone and verified that I am speaking with the correct person using two identifiers.  Location: Patient: Car  Provider: [facility] [place] office    I discussed the limitations, risks, security and privacy concerns of performing an evaluation and management service by telephone and the availability of in person appointments. I also discussed with the patient that there may be a patient responsible charge related to this service. The patient expressed understanding and agreed to proceed.  I provided 48 minutes of non-face-to-face time during this encounter.   Adah Salvage, LCSW    THERAPIST PROGRESS NOTE  Session Time:  Thursday 05/27/2021 11:10 AM - 11:58 AM   Participation Level: Active  Behavioral Response: CasualAlertAnxious  Type of Therapy: Individual Therapy  Treatment Goals addressed: Achieve controlled behavior, moderated mood, more deliberately of speech and thought process, and a stable daily activity pattern AEB patient using a daily schedule of activities, self-care, and sleep hygiene consistently for 3 weeks  Interventions: CBT and Supportive  Summary: Sandra Glover is a 35 y.o. female who presents with a longstanding history of symptoms of depression and more recently has been experiencing mood swings.  Other symptoms include racing thoughts, poor concentration, increased energy, impulsivity, sleep difficulty, and irritability.  She is a returning patient to this clinician.  Patient has had no psychiatric hospitalizations.  Patient last was seen via virtual visit about 2 weeks ago.  She reports increased manic like symptoms including sleep difficulty, poor concentration, restlessness, memory difficulty, irritability, racing thoughts, and impulsivity.  Patient reports this is impacting her job as she she feels wired at work, has done tasks and cannot  remember doing them, and has made careless mistakes.  She reports difficulty comprehending and responding to questions due to racing thoughts.  She reports sometimes having uncontrollable urges at home to get into her car and go for drive.  She reports being irritable with husband and children.  She has concerns current dosage of Seroquel may not be enough and has left message for psychiatrist.   Suicidal/Homicidal: Nowithout intent/plan  Therapist Response: Reviewed symptoms, discussed stressors, facilitated expression of thoughts and feelings, discussed feelings, discussed medication issues and therapist will follow-up with CMA regarding patient's concerns, developed treatment plan with patient, obtained patient's permission to initial plan for patient as this was a virtual visit, will send in patient handouts including psychoeducation on bipolar disorder as well as Daily mood logs in preparation for next session, developed plan with patient to begin using mood logs  Plan: Return again in 2 weeks.  Diagnosis: Axis I: MDD    Axis II: No diagnosis    Adah Salvage, LCSW 05/27/2021

## 2021-05-27 NOTE — Telephone Encounter (Signed)
I reviewed the chart.  On the last note patient supposed to try Abilify 2 mg, not sure if she tried it or not.  Then she called to keep the current medication and medicine refills were sent by Dr. Tenny Craw.  I also tried to call the patient to clarify about medication but she did not pick up the phone I left a message.  If she 1 week and send low-dose Abilify or if she has never tried before we can try lamotrigine to help her mood.  We will wait for her call back.

## 2021-05-28 NOTE — Telephone Encounter (Signed)
Please see my note about medication adjustment. Thanks

## 2021-05-31 ENCOUNTER — Other Ambulatory Visit: Payer: Self-pay | Admitting: Gastroenterology

## 2021-05-31 DIAGNOSIS — R197 Diarrhea, unspecified: Secondary | ICD-10-CM

## 2021-05-31 DIAGNOSIS — K219 Gastro-esophageal reflux disease without esophagitis: Secondary | ICD-10-CM

## 2021-05-31 NOTE — Telephone Encounter (Signed)
LMOM

## 2021-05-31 NOTE — Telephone Encounter (Signed)
Per previous messages pt called back and stated she would like to up the dose of her Seroquel due to it working at the time.   Per pt when it comes to the Abilify, her pay for this medication was going to be $300 plus.   Dr. Tenny Craw changed her to low dose Seroquel. So patient would like to see if she could up her Seroquel to see if this helps with her current situation.

## 2021-06-02 NOTE — Telephone Encounter (Signed)
Spoke with patient and she stated she is currently taking the 25 mg of Seroquel.

## 2021-06-02 NOTE — Telephone Encounter (Signed)
Patient needs ov for further refills. Limited supply provided today.

## 2021-06-03 ENCOUNTER — Encounter: Payer: Self-pay | Admitting: Gastroenterology

## 2021-06-09 ENCOUNTER — Encounter: Payer: Self-pay | Admitting: Gastroenterology

## 2021-06-09 ENCOUNTER — Other Ambulatory Visit: Payer: Self-pay | Admitting: Gastroenterology

## 2021-06-09 DIAGNOSIS — K219 Gastro-esophageal reflux disease without esophagitis: Secondary | ICD-10-CM

## 2021-06-09 DIAGNOSIS — R197 Diarrhea, unspecified: Secondary | ICD-10-CM

## 2021-06-09 NOTE — Telephone Encounter (Signed)
Spoke with patient and informed her with what provider stated and she verbalized understanding and stated that she will try that.

## 2021-06-09 NOTE — Telephone Encounter (Signed)
I refilled but needs office visit prior to additional refills.

## 2021-06-10 ENCOUNTER — Other Ambulatory Visit: Payer: Self-pay

## 2021-06-10 ENCOUNTER — Ambulatory Visit (INDEPENDENT_AMBULATORY_CARE_PROVIDER_SITE_OTHER): Payer: PRIVATE HEALTH INSURANCE | Admitting: Psychiatry

## 2021-06-10 DIAGNOSIS — F331 Major depressive disorder, recurrent, moderate: Secondary | ICD-10-CM

## 2021-06-10 DIAGNOSIS — F900 Attention-deficit hyperactivity disorder, predominantly inattentive type: Secondary | ICD-10-CM

## 2021-06-10 NOTE — Progress Notes (Signed)
Virtual Visit via Video Note  I connected with Sandra Glover on 06/10/21 at 3:08 PM EDT  by a video enabled telemedicine application and verified that I am speaking with the correct person using two identifiers.  Location: Patient: Home Provider: [facility] [place] office    I discussed the limitations of evaluation and management by telemedicine and the availability of in person appointments. The patient expressed understanding and agreed to proceed.   I provided 49 minutes of non-face-to-face time during this encounter.   Adah Salvage, LCSW   THERAPIST PROGRESS NOTE  Session Time:  Thursday 06/10/2021 3:08 PM - 3:57 PM   Participation Level: Active  Behavioral Response: CasualAlertAnxious  Type of Therapy: Individual Therapy  Treatment Goals addressed: Achieve controlled behavior, moderated mood, more deliberately of speech and thought process, and a stable daily activity pattern AEB patient using a daily schedule of activities, self-care, and sleep hygiene consistently for 3 weeks  Interventions: CBT and Supportive  Summary: [name] is a 35 y.o. female who presents with a longstanding history of symptoms of depression and more recently has been experiencing mood swings.  Other symptoms include racing thoughts, poor concentration, increased energy, impulsivity, sleep difficulty, and irritability.  She is a returning patient to this clinician.  Patient has had no psychiatric hospitalizations.  Patient last was seen via virtual visit about 2 weeks ago.  She reports continued manic like symptoms including sleep difficulty, poor concentration, restlessness, memory difficulty, irritability, racing thoughts, and impulsivity until this past weekend.  At that time, patient reports crashing going into a depressive episode having poor motivation and staying in bed.  She reports beginning to experience manic-like symptoms again on Tuesday.  She reports she began taking  increased dosage of medication last night and is hopeful this will help.  She has been using the bipolar disorder mood log and says this has been very helpful in recognizing her symptoms.  She reports trying to use this information to discuss her concerns with her husband and solicit his help.  Per patient's report, he has been very supportive.   Suicidal/Homicidal: Nowithout intent/plan  Therapist Response: Reviewed symptoms, praised and reinforced patient's use of bipolar mood log, assisted patient began to examine her patterns and identify recurrent problematic areas, being began to identify the most problematic symptoms during a manic episode, discussed ways to address sleep difficulty/schedule, developed plan with patient to establish a bedtime ritual with a consistent bedtime as well as get up time, assisted patient identify ways to use her support system to manage other areas of (spontaneous/impulsive behavior, tasks that require concentration, some parenting responsibilities) including communication with husband and sharing information about her needs during that time, developed plan with patient to continue using bipolar mood log, also develop plan with patient to complete handout on bipolar disorder relapse signatures in preparation for next session  Plan: Return again in 2 weeks.  Diagnosis: Axis I: MDD    Axis II: No diagnosis    Adah Salvage, LCSW 06/10/2021

## 2021-06-16 ENCOUNTER — Telehealth (HOSPITAL_COMMUNITY): Payer: Self-pay | Admitting: Psychiatry

## 2021-06-16 ENCOUNTER — Telehealth (HOSPITAL_COMMUNITY): Payer: PRIVATE HEALTH INSURANCE | Admitting: Psychiatry

## 2021-06-16 NOTE — Telephone Encounter (Signed)
She should have orders for quetiapine, and it should be 25 mg at night according to the chart.

## 2021-06-16 NOTE — Telephone Encounter (Signed)
Patient advised needs refill for 50mg  Seroquel.

## 2021-06-17 ENCOUNTER — Other Ambulatory Visit: Payer: Self-pay

## 2021-06-17 MED ORDER — GABAPENTIN 100 MG PO CAPS: 100.0000 mg | ORAL_CAPSULE | Freq: Three times a day (TID) | ORAL | 0 refills | Status: DC

## 2021-06-21 ENCOUNTER — Telehealth (HOSPITAL_COMMUNITY): Payer: Self-pay

## 2021-06-21 DIAGNOSIS — F331 Major depressive disorder, recurrent, moderate: Secondary | ICD-10-CM

## 2021-06-21 NOTE — Telephone Encounter (Signed)
I did not see the note in the system that says Seroquel dosage is increased to 2 tablets of 25 mg.  However if patient would like to go up on the dosage we could send a prescription.  I would like to discuss this with patient prior to sending it out.  I have left a voicemail for patient to reach out to [place].  We will have Rinaldo Cloud CMA also reach out to patient to discuss since I do not see that note in the system.

## 2021-06-21 NOTE — Telephone Encounter (Deleted)
Patient called requesting a refill on her Quetiapine. It was sent in on 6/15 #30 w/2 refills for 25mg . Patient stated that she takes 2 tablets - 50mg  in total per conversation w/her provider.  A message was sent to covering provider on 06/16/21 regarding the refill that stated that "she should have orders for Quetiapine & it should be 25mg  at night according to the chart." According to her current medication list, There is another message in here from 05/27/21 that states that she "can start taking 2 of the 25mg ." Because of the discrepencies with the messages, 06/18/21 called the pharmacy. They stated that they don't have a script

## 2021-06-21 NOTE — Telephone Encounter (Signed)
LMOM

## 2021-06-21 NOTE — Telephone Encounter (Signed)
This is Dr. Charlott Rakes patient. Patient called requesting a refill on her Quetiapine. It was sent in on 6/15 #30 w/2 refills for 25mg . Patient stated that she takes 2 tablets - 50mg  in total per conversation w/her provider.  A message was sent to covering provider on 06/16/21 regarding the refill that stated that "she should have orders for Quetiapine & it should be 25mg  at night according to the chart." According to her current medication list, Dr. had sent in Quetiapine 25mg  #30 w/2 refills on 05/19/21. There is another message in here from 05/27/21 that states that she "can start taking 2 of the 25mg ." Because of the discrepencies with the messages, Tenny Craw called the pharmacy. They stated that they don't have refills regardless so a new script would have to be sent in. Please review and advise. Thank you

## 2021-06-22 DIAGNOSIS — F331 Major depressive disorder, recurrent, moderate: Secondary | ICD-10-CM | POA: Insufficient documentation

## 2021-06-22 MED ORDER — QUETIAPINE FUMARATE 50 MG PO TABS: 50.0000 mg | ORAL_TABLET | Freq: Every day | ORAL | 0 refills | Status: DC

## 2021-06-22 NOTE — Telephone Encounter (Signed)
pt called left a message that she was returning your call states that the seroquel 25mg  bid works good wanted to know if she can get seroquel 50mg  sent into the pharmacy. she wanted to speak with you .

## 2021-06-22 NOTE — Telephone Encounter (Signed)
Returned call to patient.  Patient reported that beginning of July-Octavia RMA contacted her and had advised to increase her Seroquel to 50 mg and this was after discussion with a female provider-she does not remember the name.  Patient reports the 50 mg of Seroquel works well for her and she wants to stay on this dosage.  We will send a 30-day supply of Seroquel 50 mg to pharmacy.  Patient advised to follow-up with her primary provider-Dr.Ross for continued management of medications.  Will also sent this message to Dr.Ross.

## 2021-06-22 NOTE — Telephone Encounter (Signed)
noted 

## 2021-06-24 ENCOUNTER — Ambulatory Visit (INDEPENDENT_AMBULATORY_CARE_PROVIDER_SITE_OTHER): Payer: PRIVATE HEALTH INSURANCE | Admitting: Psychiatry

## 2021-06-24 ENCOUNTER — Other Ambulatory Visit: Payer: Self-pay

## 2021-06-24 DIAGNOSIS — F331 Major depressive disorder, recurrent, moderate: Secondary | ICD-10-CM | POA: Diagnosis not present

## 2021-06-24 NOTE — Progress Notes (Signed)
Virtual Visit via Video Note  I connected with [name] on 06/24/21 at 11:15 AM EDT by a video enabled telemedicine application and verified that I am speaking with the correct person using two identifiers.  Location: Patient: Car  Provider: [facility] [place] office    I discussed the limitations of evaluation and management by telemedicine and the availability of in person appointments. The patient expressed understanding and agreed to proceed.  I provided 43 minutes of non-face-to-face time during this encounter.   Adah Salvage, LCSW   THERAPIST PROGRESS NOTE  Session Time:  Thursday 06/24/2021 11:15 AM - 11:58 PM   Participation Level: Active  Behavioral Response: CasualAlertAnxious  Type of Therapy: Individual Therapy  Treatment Goals addressed: Achieve controlled behavior, moderated mood, more deliberately of speech and thought process, and a stable daily activity pattern AEB patient using a daily schedule of activities, self-care, and sleep hygiene consistently for 3 weeks  Interventions: CBT and Supportive  Summary: Sandra Glover is a 35 y.o. female who presents with a longstanding history of symptoms of depression and more recently has been experiencing mood swings.  Other symptoms include racing thoughts, poor concentration, increased energy, impulsivity, sleep difficulty, and irritability.  She is a returning patient to this clinician.  Patient has had no psychiatric hospitalizations.  Patient last was seen via virtual visit about 2 weeks ago.  She reports decreased manic-like symptoms and reports improved concentration, decreased irritability, and decreased impulsivity.  She states abeing in a more neutral mood for the past 4 to 5 days.  However, she reports feeling a little elevated this morning.  She reports she has experienced improved sleep pattern since taking increased dosage of medication but having sleep difficulty last night due to issues  with her 38-year-old daughter.  She still reports some difficulty developing a bedtime ritual and maintaining it when experiencing manic-like symptoms.  Patient has been using the bipolar disorder mood log consistently.  She also reports completing bipolar disorder relapse signatures with input from her husband.  She reports now being more aware of her mood changes and making adjustments to her activities along with asking for help when needed.  She reports strong support from her husband as well as her boss.   Suicidal/Homicidal: Nowithout intent/plan  Therapist Response: Reviewed symptoms, praised and reinforced patient's use of bipolar mood log, patient reinforced patient's efforts to improve sleep pattern/sleep hygiene, discussed ways to improve bedtime ritual and maintain consistency, will send patient handout on improving sleep hygiene, praised and reinforced patient's recognition of her symptoms and use of her support system, developed plan with patient to continue using bipolar mood log   Plan: Return again in 2 weeks.  Diagnosis: Axis I: MDD    Axis II: No diagnosis    Adah Salvage, LCSW 06/24/2021

## 2021-06-28 ENCOUNTER — Encounter (HOSPITAL_COMMUNITY): Payer: Self-pay | Admitting: Psychiatry

## 2021-06-28 DIAGNOSIS — J019 Acute sinusitis, unspecified: Secondary | ICD-10-CM | POA: Insufficient documentation

## 2021-07-13 ENCOUNTER — Telehealth (HOSPITAL_COMMUNITY): Payer: Self-pay | Admitting: *Deleted

## 2021-07-13 NOTE — Telephone Encounter (Signed)
Pt FMLA form is completed and ready to be faxed. Staff do not have release of information. Staff called patient and LMOM to call office back.

## 2021-07-14 ENCOUNTER — Other Ambulatory Visit: Payer: Self-pay

## 2021-07-14 ENCOUNTER — Encounter (HOSPITAL_COMMUNITY): Payer: Self-pay | Admitting: Psychiatry

## 2021-07-14 ENCOUNTER — Telehealth (INDEPENDENT_AMBULATORY_CARE_PROVIDER_SITE_OTHER): Payer: PRIVATE HEALTH INSURANCE | Admitting: Psychiatry

## 2021-07-14 DIAGNOSIS — F331 Major depressive disorder, recurrent, moderate: Secondary | ICD-10-CM

## 2021-07-14 DIAGNOSIS — F900 Attention-deficit hyperactivity disorder, predominantly inattentive type: Secondary | ICD-10-CM | POA: Diagnosis not present

## 2021-07-14 DIAGNOSIS — F4310 Post-traumatic stress disorder, unspecified: Secondary | ICD-10-CM | POA: Diagnosis not present

## 2021-07-14 MED ORDER — QUETIAPINE FUMARATE 50 MG PO TABS: 50.0000 mg | ORAL_TABLET | Freq: Every day | ORAL | 2 refills | Status: DC

## 2021-07-14 MED ORDER — FLUOXETINE HCL 40 MG PO CAPS: ORAL_CAPSULE | ORAL | 2 refills | Status: DC

## 2021-07-14 MED ORDER — AMPHETAMINE-DEXTROAMPHETAMINE 30 MG PO TABS: 30.0000 mg | ORAL_TABLET | Freq: Every day | ORAL | 0 refills | Status: DC

## 2021-07-14 MED ORDER — ALPRAZOLAM 1 MG PO TABS: 1.0000 mg | ORAL_TABLET | Freq: Two times a day (BID) | ORAL | 2 refills | Status: DC | PRN

## 2021-07-14 NOTE — Progress Notes (Signed)
Virtual Visit via Video Note  I connected with Sandra Glover on 07/14/21 at 11:00 AM EDT by a video enabled telemedicine application and verified that I am speaking with the correct person using two identifiers.  Location: Patient: home Provider: home office   I discussed the limitations of evaluation and management by telemedicine and the availability of in person appointments. The patient expressed understanding and agreed to proceed.     I discussed the assessment and treatment plan with the patient. The patient was provided an opportunity to ask questions and all were answered. The patient agreed with the plan and demonstrated an understanding of the instructions.   The patient was advised to call back or seek an in-person evaluation if the symptoms worsen or if the condition fails to improve as anticipated.  I provided 15minutes of non-face-to-face time during this encounter.   Diannia Rudereborah Juliano Mceachin, MD  [facility] OP Progress Note  07/14/2021 2:37 PM Sandra Glover  MRN:  [id number]  Chief Complaint:  Chief Complaint   Depression; Manic Behavior; ADD; Follow-up    HPI:  HPI: This patient is a 35 year old married white female who lives [place] her husband and two children in [place]. 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  The patient returns for follow-up after 4 months.  Apparently while I was out all over the summer she called and stated she was having racing thoughts difficulty sleeping and more agitation.  The doctor covering increased her Seroquel to 50 mg at bedtime.  She states that this is helped a good deal but she is only been on it about 3 weeks.  The manic symptoms have settled down considerably.  She is sleeping much better.  She is somewhat depressed but is primarily due to stress.  Her husband quit his job a couple of months ago and has not found anything else.  He is not particularly helpful around the house or with the children.  She  states that he also suffers from depression.  She is the only one working and feels the pressure regarding this.  She is in counseling with Florencia ReasonsPeggy Bynum which has been helpful.  The patient currently denies severe depression or suicidal ideation.  Her anxiety is well controlled but she does not like to use the Xanax too often.  She is focusing for the most part with the Adderall but it wears off in mid afternoon so we will switch to Adderall XR. Visit Diagnosis:    ICD-10-CM   1. Attention deficit hyperactivity disorder (ADHD), predominantly inattentive type  F90.0     2. Major depressive disorder, recurrent episode, moderate (HCC)  F33.1 QUEtiapine (SEROQUEL) 50 MG tablet    3. PTSD (post-traumatic stress disorder)  F43.10       Past Psychiatric History: Long-term outpatient treatment  Past Medical History:  Past Medical History:  Diagnosis Date   Anxiety    Anxiety    Depression    Gestational diabetes mellitus, antepartum    gestational only   History of anemia 2013   History of depression    states stopped med. 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   right - due to kidney infection   Hypertension    Osteopenia    Pertussis    Postcoital bleeding 07/14/2015   Sinus infection 01/25/2013   started antibiotic 01/24/2013 x 10 days; nasal congestion, clear drainage from nose   Tonsillar and adenoid hypertrophy 01/2013  Vaginal irritation 03/25/2014   Mild yeast but has history of yeast, will not treat til after first trimester,try yogurt    Past Surgical History:  Procedure Laterality Date   CHOLECYSTECTOMY  04/23/2010   laparoscopic   COLONOSCOPY WITH PROPOFOL N/A 05/17/2016   Procedure: COLONOSCOPY WITH PROPOFOL;  Surgeon: West Bali, MD;  [facility];  Service: Endoscopy;  Laterality: N/A;  1000   HEMORRHOID BANDING N/A 05/17/2016   Procedure: HEMORRHOID BANDING;  Surgeon: West Bali, MD;  [facility];  Service: Endoscopy;   Laterality: N/A;   HEMORRHOID SURGERY N/A 06/08/2016   Procedure: SIMPLE HEMORRHOIDECTOMY;  Surgeon: Ancil Linsey, MD;  Location: [facility];  Service: General;  Laterality: N/A;   PARTIAL NEPHRECTOMY Right age 67 mos.   TONSILLECTOMY AND ADENOIDECTOMY N/A 01/29/2013   Procedure: TONSILLECTOMY AND ADENOIDECTOMY;  Surgeon: Darletta Moll, MD;  Location: [facility];  Service: ENT;  Laterality: N/A;    Family Psychiatric History: see below  Family History:  Family History  Problem Relation Age of Onset   COPD Mother    Depression Mother    Anxiety disorder Mother    Hypertension Mother    Irritable bowel syndrome Mother    Arthritis Mother    Skin cancer Mother    Thyroid cancer Mother    Ovarian cancer Maternal Aunt    Ovarian cancer Maternal Grandmother    Cancer Maternal Grandmother        bladder, kidney   Hypertension Father    GER disease Father    Bipolar disorder Paternal Uncle    Anxiety disorder Cousin    Drug abuse Cousin    Bipolar disorder Cousin    Colon cancer Neg Hx     Social History:  Social History   Socioeconomic History   Marital status: Married    Spouse name: Not on file   Number of children: Not on file   Years of education: Not on file   Highest education level: Not on file  Occupational History   Not on file  Tobacco Use   Smoking status: Never   Smokeless tobacco: Never   Tobacco comments:    Never smoked  Vaping Use   Vaping Use: Never used  Substance and Sexual Activity   Alcohol use: Yes    Alcohol/week: 0.0 standard drinks    Comment: socially   Drug use: No   Sexual activity: Yes    Birth control/protection: Implant    Comment: nexplanon implant  Other Topics Concern   Not on file  Social History Narrative   Not on file   Social Determinants of Health   Financial Resource Strain: Not on file  Food Insecurity: Not on file  Transportation Needs: Not on file  Physical Activity: Not on file  Stress: Not on file   Social Connections: Not on file    Allergies:  Allergies  Allergen Reactions   Lortab [Hydrocodone-Acetaminophen] Nausea And Vomiting   Morphine And Related Rash    Metabolic Disorder Labs: No results found for: HGBA1C, MPG No results found for: PROLACTIN No results found for: CHOL, TRIG, HDL, CHOLHDL, VLDL, LDLCALC Lab Results  Component Value Date   TSH 2.553 10/13/2017    Therapeutic Level Labs: No results found for: LITHIUM No results found for: VALPROATE No components found for:  CBMZ  Current Medications: Current Outpatient Medications  Medication Sig Dispense Refill   ALPRAZolam (XANAX) 1 MG tablet Take 1 tablet (1 mg  total) by mouth 2 (two) times daily as needed for anxiety. 60 tablet 2   amphetamine-dextroamphetamine (ADDERALL) 30 MG tablet Take 1 tablet by mouth every morning. 30 tablet 0   amphetamine-dextroamphetamine (ADDERALL) 30 MG tablet Take 1 tablet by mouth every morning. 30 tablet 0   amphetamine-dextroamphetamine (ADDERALL) 30 MG tablet Take 1 tablet by mouth daily. 30 tablet 0   calcium-vitamin D (OSCAL WITH D) 500-200 MG-UNIT tablet Take 1 tablet by mouth.     dicyclomine (BENTYL) 10 MG capsule Take 1 capsule (10 mg total) by mouth 4 (four) times daily -  before meals and at bedtime. 90 capsule 0   etonogestrel (NEXPLANON) 68 MG IMPL implant 1 each by Subdermal route once.     FLUoxetine (PROZAC) 40 MG capsule TAKE ONE CAPSULE BY MOUTH EVERY DAY 30 capsule 2   gabapentin (NEURONTIN) 100 MG capsule Take 1 capsule (100 mg total) by mouth 3 (three) times daily. 90 capsule 0   lisinopril-hydrochlorothiazide (ZESTORETIC) 20-12.5 MG tablet Take 1 tablet by mouth daily.     megestrol (MEGACE) 40 MG tablet Take 3 x 5 days then 2 x 5 days then 1 daily 45 tablet 1   meloxicam (MOBIC) 7.5 MG tablet TAKE 1 TABLET BY MOUTH DAILY FOR NECK PAIN AND HEADACHE. MAY INCREASE TO 2 TABLEST IF NOT EFFECTIVE     omeprazole (PRILOSEC) 40 MG capsule TAKE 1 CAPSULE(40 MG) BY  MOUTH DAILY 30 capsule 3   ondansetron (ZOFRAN) 4 MG tablet Take 1 tablet (4 mg total) by mouth every 6 (six) hours. 12 tablet 0   QUEtiapine (SEROQUEL) 50 MG tablet Take 1 tablet (50 mg total) by mouth at bedtime. 30 tablet 2   No current facility-administered medications for this visit.     Musculoskeletal: Strength & Muscle Tone: within normal limits Gait & Station: normal Patient leans: N/A  Psychiatric Specialty Exam: Review of Systems  Psychiatric/Behavioral:  Positive for dysphoric mood. The patient is nervous/anxious.   All other systems reviewed and are negative.  There were no vitals taken for this visit.There is no height or weight on file to calculate BMI.  General Appearance: Casual and Fairly Groomed  Eye Contact:  Good  Speech:  Clear and Coherent  Volume:  Normal  Mood:  Anxious and Dysphoric  Affect:  Appropriate and Congruent  Thought Process:  Goal Directed  Orientation:  Full (Time, Place, and Person)  Thought Content: Rumination   Suicidal Thoughts:  No  Homicidal Thoughts:  No  Memory:  Immediate;   Good Recent;   Good Remote;   Good  Judgement:  Good  Insight:  Good  Psychomotor Activity:  Normal  Concentration:  Concentration: Fair and Attention Span: Fair  Recall:  Good  Fund of Knowledge: Good  Language: Good  Akathisia:  No  Handed:  Right  AIMS (if indicated): not done  Assets:  Communication Skills Desire for Improvement Physical Health Resilience Social Support Talents/Skills Vocational/Educational  ADL's:  Intact  Cognition: WNL  Sleep:  Good   Screenings: GAD-7    Flowsheet Row Counselor from 09/27/2018 [facility]  Total GAD-7 Score 18      PHQ2-9    Flowsheet Row Video Visit from 07/14/2021 in [facility] Counselor from 05/13/2021 in [facility] Video Visit from 03/17/2021 in [facility] Counselor from 09/27/2018 in [facility] Office Visit from 10/16/2017 in [facility]  PHQ-2  Total Score 2 2 1 6 6   PHQ-9 Total Score 4 10 -- 21 16      Flowsheet Row Video Visit from 07/14/2021 in [facility] Counselor from 05/13/2021 in [facility] ED from 04/27/2021 in [facility] EMERGENC[facility] DEPART[facility]  C-SSRS RISK CATEGORY No Risk Low Risk No Risk        Assessment and Plan: This patient is a 35 year old female with a history of depression anxiety ADD.  Last time we thought her diagnosis might be more congruent with bipolar 2 disorder.  The Seroquel has helped to some degree but is doing better at the higher dosage.  Since she only started 3 weeks ago I think we should continue at the 50 mg dose at bedtime.  She will continue Prozac 40 mg daily for depression and Xanax 1 mg twice daily as needed for anxiety.  Adderall will be changed to Adderall XR 30 mg every morning for focus.  She will return to see me in 4 weeks   34, MD 07/14/2021, 2:37 PM

## 2021-07-16 ENCOUNTER — Telehealth (HOSPITAL_COMMUNITY): Payer: Self-pay | Admitting: *Deleted

## 2021-07-16 NOTE — Telephone Encounter (Signed)
Staff called patient mobile per previous message about her FMLA forms that were completed by Dr. Tenny Craw. Staff [facility] stating that patient need to sign release of information for the location she needs the forms to go to and for the FMLA form, office needs the name/phone/fax number of the location forms needs to go to. Office provided on patient VM.

## 2021-07-30 ENCOUNTER — Other Ambulatory Visit: Payer: Self-pay

## 2021-07-30 ENCOUNTER — Ambulatory Visit (HOSPITAL_COMMUNITY): Payer: PRIVATE HEALTH INSURANCE | Admitting: Psychiatry

## 2021-07-30 ENCOUNTER — Telehealth (HOSPITAL_COMMUNITY): Payer: Self-pay | Admitting: Psychiatry

## 2021-07-30 NOTE — Telephone Encounter (Signed)
Therapist attempted to contact patient twice via text through caregility platform for scheduled appointment, no response.  Therapist left message indicating attempt and requesting patient call office. 

## 2021-08-10 ENCOUNTER — Telehealth (HOSPITAL_COMMUNITY): Payer: Self-pay | Admitting: *Deleted

## 2021-08-10 ENCOUNTER — Other Ambulatory Visit (HOSPITAL_COMMUNITY): Payer: Self-pay | Admitting: Psychiatry

## 2021-08-10 MED ORDER — AMPHETAMINE-DEXTROAMPHETAMINE 30 MG PO TABS: 30.0000 mg | ORAL_TABLET | ORAL | 0 refills | Status: DC

## 2021-08-10 NOTE — Telephone Encounter (Signed)
Patient called stating that [facility] just inform her that they do not have Adderall in stock or in their warehouse. Patient would like for provider to please send in her Adderall to [facility] [address] in [place], [place]

## 2021-08-10 NOTE — Telephone Encounter (Signed)
sent 

## 2021-08-13 ENCOUNTER — Other Ambulatory Visit: Payer: Self-pay

## 2021-08-13 ENCOUNTER — Encounter (HOSPITAL_COMMUNITY): Payer: Self-pay | Admitting: Psychiatry

## 2021-08-13 ENCOUNTER — Telehealth (INDEPENDENT_AMBULATORY_CARE_PROVIDER_SITE_OTHER): Payer: PRIVATE HEALTH INSURANCE | Admitting: Psychiatry

## 2021-08-13 DIAGNOSIS — F331 Major depressive disorder, recurrent, moderate: Secondary | ICD-10-CM | POA: Diagnosis not present

## 2021-08-13 DIAGNOSIS — F4310 Post-traumatic stress disorder, unspecified: Secondary | ICD-10-CM

## 2021-08-13 DIAGNOSIS — F900 Attention-deficit hyperactivity disorder, predominantly inattentive type: Secondary | ICD-10-CM | POA: Diagnosis not present

## 2021-08-13 MED ORDER — QUETIAPINE FUMARATE 50 MG PO TABS: 50.0000 mg | ORAL_TABLET | Freq: Every day | ORAL | 2 refills | Status: DC

## 2021-08-13 MED ORDER — AMPHETAMINE-DEXTROAMPHET ER 30 MG PO CP24: 30.0000 mg | ORAL_CAPSULE | Freq: Every day | ORAL | 0 refills | Status: DC

## 2021-08-13 MED ORDER — FLUOXETINE HCL 40 MG PO CAPS: ORAL_CAPSULE | ORAL | 2 refills | Status: DC

## 2021-08-13 MED ORDER — ALPRAZOLAM 1 MG PO TABS: 1.0000 mg | ORAL_TABLET | Freq: Two times a day (BID) | ORAL | 2 refills | Status: DC | PRN

## 2021-08-13 NOTE — Progress Notes (Signed)
Virtual Visit via Video Note  I connected with Sandra Glover on 08/13/21 at 11:00 AM EDT by a video enabled telemedicine application and verified that I am speaking with the correct person using two identifiers.  Location: Patient: home Provider: home office   I discussed the limitations of evaluation and management by telemedicine and the availability of in person appointments. The patient expressed understanding and agreed to proceed.      I discussed the assessment and treatment plan with the patient. The patient was provided an opportunity to ask questions and all were answered. The patient agreed with the plan and demonstrated an understanding of the instructions.   The patient was advised to call back or seek an in-person evaluation if the symptoms worsen or if the condition fails to improve as anticipated.  I provided 15 minutes of non-face-to-face time during this encounter.   [name], MD  [facility] MD/PA/NP OP Progress Note  08/13/2021 11:34 AM Sandra Glover  MRN:  [id number]  Chief Complaint: depression, anxiety, ADD HPI: This patient is a 35 year old married white female who lives with her husband and two children in [place]. 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  The patient returns for follow-up after 4 weeks.  Last time she had had more agitation of the summer and her Seroquel had been increased while I was out by the covering physician.  This seems to have helped.  However lately she has been a little bit more stressed and.  Her 74-year-old daughter has been having behavioral problems in school.  She is also been very disobedient difficult at home per tickly with her husband.  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  The patient is eating okay she is very tired by the end of the day.  She was supposed to started Adderall XR but somehow the prescription did not make it to the pharmacy and we will need to  send it.  She denies suicidal ideation and is functioning fairly well right now.  She does need to take more time out for herself. Visit Diagnosis:    ICD-10-CM   1. Major depressive disorder, recurrent episode, moderate (HCC)  F33.1 QUEtiapine (SEROQUEL) 50 MG tablet    2. Attention deficit hyperactivity disorder (ADHD), predominantly inattentive type  F90.0     3. PTSD (post-traumatic stress disorder)  F43.10       Past Psychiatric History: Long-term outpatient treatment  Past Medical History:  Past Medical History:  Diagnosis Date   Anxiety    Anxiety    Depression    Gestational diabetes mellitus, antepartum    gestational only   History of anemia 2013   History of depression    states stopped med. in 2013   History of partial nephrectomy age 49 mos.   right - due to kidney infection   Hypertension    Osteopenia    Pertussis    Postcoital bleeding 07/14/2015   Sinus infection 01/25/2013   started antibiotic 01/24/2013 x 10 days; nasal congestion, clear drainage from nose   Tonsillar and adenoid hypertrophy 01/2013   Vaginal irritation 03/25/2014   Mild yeast but has history of yeast, will not treat til after first trimester,try yogurt    Past Surgical History:  Procedure Laterality Date   CHOLECYSTECTOMY  04/23/2010   laparoscopic   COLONOSCOPY WITH PROPOFOL N/A 05/17/2016   Procedure: COLONOSCOPY WITH PROPOFOL;  Surgeon: Darleene Cleaver  Fields, MD;  [facility];  Service: Endoscopy;  Laterality: N/A;  1000   HEMORRHOID BANDING N/A 05/17/2016   Procedure: HEMORRHOID BANDING;  Surgeon: West Bali, MD;  [facility];  Service: Endoscopy;  Laterality: N/A;   HEMORRHOID SURGERY N/A 06/08/2016   Procedure: SIMPLE HEMORRHOIDECTOMY;  Surgeon: Ancil Linsey, MD;  Location: [facility];  Service: General;  Laterality: N/A;   PARTIAL NEPHRECTOMY Right age 75 mos.   TONSILLECTOMY AND ADENOIDECTOMY N/A 01/29/2013   Procedure: TONSILLECTOMY AND ADENOIDECTOMY;  Surgeon: Darletta Moll, MD;  Location: [facility];  Service: ENT;  Laterality: N/A;    Family Psychiatric History: see below  Family History:  Family History  Problem Relation Age of Onset   COPD Mother    Depression Mother    Anxiety disorder Mother    Hypertension Mother    Irritable bowel syndrome Mother    Arthritis Mother    Skin cancer Mother    Thyroid cancer Mother    Ovarian cancer Maternal Aunt    Ovarian cancer Maternal Grandmother    Cancer Maternal Grandmother        bladder, kidney   Hypertension Father    GER disease Father    Bipolar disorder Paternal Uncle    Anxiety disorder Cousin    Drug abuse Cousin    Bipolar disorder Cousin    Colon cancer Neg Hx     Social History:  Social History   Socioeconomic History   Marital status: Married    Spouse name: Not on file   Number of children: Not on file   Years of education: Not on file   Highest education level: Not on file  Occupational History   Not on file  Tobacco Use   Smoking status: Never   Smokeless tobacco: Never   Tobacco comments:    Never smoked  Vaping Use   Vaping Use: Never used  Substance and Sexual Activity   Alcohol use: Yes    Alcohol/week: 0.0 standard drinks    Comment: socially   Drug use: No   Sexual activity: Yes    Birth control/protection: Implant    Comment: nexplanon implant  Other Topics Concern   Not on file  Social History Narrative   Not on file   Social Determinants of Health   Financial Resource Strain: Not on file  Food Insecurity: Not on file  Transportation Needs: Not on file  Physical Activity: Not on file  Stress: Not on file  Social Connections: Not on file    Allergies:  Allergies  Allergen Reactions   Lortab [Hydrocodone-Acetaminophen] Nausea And Vomiting   Morphine And Related Rash    Metabolic Disorder Labs: No results found for: HGBA1C, MPG No results found for: PROLACTIN No results found for: CHOL, TRIG, HDL, CHOLHDL, VLDL,  LDLCALC Lab Results  Component Value Date   TSH 2.553 10/13/2017    Therapeutic Level Labs: No results found for: LITHIUM No results found for: VALPROATE No components found for:  CBMZ  Current Medications: Current Outpatient Medications  Medication Sig Dispense Refill   amphetamine-dextroamphetamine (ADDERALL XR) 30 MG 24 hr capsule Take 1 capsule (30 mg total) by mouth daily. 30 capsule 0   ALPRAZolam (XANAX) 1 MG tablet Take 1 tablet (1 mg total) by mouth 2 (two) times daily as needed for anxiety. 60 tablet 2   calcium-vitamin D (OSCAL WITH D) 500-200 MG-UNIT tablet Take 1 tablet by mouth.  dicyclomine (BENTYL) 10 MG capsule Take 1 capsule (10 mg total) by mouth 4 (four) times daily -  before meals and at bedtime. 90 capsule 0   etonogestrel (NEXPLANON) 68 MG IMPL implant 1 each by Subdermal route once.     FLUoxetine (PROZAC) 40 MG capsule TAKE ONE CAPSULE BY MOUTH EVERY DAY 30 capsule 2   gabapentin (NEURONTIN) 100 MG capsule Take 1 capsule (100 mg total) by mouth 3 (three) times daily. 90 capsule 0   lisinopril-hydrochlorothiazide (ZESTORETIC) 20-12.5 MG tablet Take 1 tablet by mouth daily.     megestrol (MEGACE) 40 MG tablet Take 3 x 5 days then 2 x 5 days then 1 daily 45 tablet 1   meloxicam (MOBIC) 7.5 MG tablet TAKE 1 TABLET BY MOUTH DAILY FOR NECK PAIN AND HEADACHE. MAY INCREASE TO 2 TABLEST IF NOT EFFECTIVE     omeprazole (PRILOSEC) 40 MG capsule TAKE 1 CAPSULE(40 MG) BY MOUTH DAILY 30 capsule 3   ondansetron (ZOFRAN) 4 MG tablet Take 1 tablet (4 mg total) by mouth every 6 (six) hours. 12 tablet 0   QUEtiapine (SEROQUEL) 50 MG tablet Take 1 tablet (50 mg total) by mouth at bedtime. 30 tablet 2   No current facility-administered medications for this visit.     Musculoskeletal: Strength & Muscle Tone: within normal limits Gait & Station: normal Patient leans: N/A  Psychiatric Specialty Exam: Review of Systems  Psychiatric/Behavioral:  Positive for decreased  concentration. The patient is nervous/anxious.   All other systems reviewed and are negative.  There were no vitals taken for this visit.There is no height or weight on file to calculate BMI.  General Appearance: Casual, Neat, and Well Groomed  Eye Contact:  Good  Speech:  Clear and Coherent  Volume:  Normal  Mood:  Anxious and Euthymic  Affect:  Appropriate and Congruent  Thought Process:  Goal Directed  Orientation:  Full (Time, Place, and Person)  Thought Content: Rumination   Suicidal Thoughts:  No  Homicidal Thoughts:  No  Memory:  Immediate;   Good Recent;   Good Remote;   Fair  Judgement:  Good  Insight:  Good  Psychomotor Activity:  Normal  Concentration:  Concentration: Fair and Attention Span: Fair  Recall:  Good  Fund of Knowledge: Good  Language: Good  Akathisia:  No  Handed:  Right  AIMS (if indicated): not done  Assets:  Communication Skills Desire for Improvement Physical Health Resilience Social Support Talents/Skills  ADL's:  Intact  Cognition: WNL  Sleep:  Good   Screenings: GAD-7    Flowsheet Row Counselor from 09/27/2018 in [facility]  Total GAD-7 Score 18      PHQ2-9    Flowsheet Row Video Visit from 08/13/2021 in [facility] Video Visit from 07/14/2021 in [facility] Counselor from 05/13/2021 in [facility] Video Visit from 03/17/2021 in [facility] Counselor from 09/27/2018 in [facility]  PHQ-2 Total Score 2 2 2 1 6   PHQ-9 Total Score 7 4 10  -- 21      Flowsheet Row Video Visit from 08/13/2021 in [facility] Video Visit from 07/14/2021 in [facility] Counselor from 05/13/2021 in [facility]  C-SSRS RISK CATEGORY No Risk No Risk Low Risk        Assessment and Plan: This patient is a 35 year old female with a history  of depression anxiety ADD and possible bipolar 2 disorder.  Her manic symptoms have abated with the higher dose of Seroquel.  However she is still having trouble focusing and getting very fatigued.  She states her recent physical exam was normal.  She will be given Adderall XR 30 mg every morning instead of regular Adderall.  She will continue Seroquel 50 mg at bedtime for mood stabilization, Prozac 40 mg daily for depression and Xanax 1 mg twice daily as needed for anxiety.  She will return to see me in 4 weeks   [name], MD 08/13/2021, 11:34 AM

## 2021-08-16 ENCOUNTER — Telehealth (HOSPITAL_COMMUNITY): Payer: Self-pay | Admitting: Psychiatry

## 2021-08-16 ENCOUNTER — Ambulatory Visit (INDEPENDENT_AMBULATORY_CARE_PROVIDER_SITE_OTHER): Payer: PRIVATE HEALTH INSURANCE | Admitting: Psychiatry

## 2021-08-16 ENCOUNTER — Other Ambulatory Visit: Payer: Self-pay

## 2021-08-16 DIAGNOSIS — F331 Major depressive disorder, recurrent, moderate: Secondary | ICD-10-CM | POA: Diagnosis not present

## 2021-08-16 NOTE — Progress Notes (Signed)
Virtual Visit via Video Note  I connected with Sandra Glover on 08/16/21 at 11:10 AM EDT  by a video enabled telemedicine application and verified that I am speaking with the correct person using two identifiers.  Location: Patient:  Home Provider:  [facility] [place] office    I discussed the limitations of evaluation and management by telemedicine and the availability of in person appointments. The patient expressed understanding and agreed to proceed.   I provided 45 minutes of non-face-to-face time during this encounter.   Adah Salvage, LCSW   THERAPIST PROGRESS NOTE  Session Time:  Monday  08/16/2021 11:10 AM - 11:55 AM  Participation Level: Active  Behavioral Response: CasualAlertAnxious  Type of Therapy: Individual Therapy  Treatment Goals addressed: Achieve controlled behavior, moderated mood, more deliberately of speech and thought process, and a stable daily activity pattern AEB patient using a daily schedule of activities, self-care, and sleep hygiene consistently for 3 weeks  Interventions: CBT and Supportive  Summary: [name] is a 35 y.o. female who presents with a longstanding history of symptoms of depression and more recently has been experiencing mood swings.  Other symptoms include racing thoughts, poor concentration, increased energy, impulsivity, sleep difficulty, and irritability.  She is a returning patient to this clinician.  Patient has had no psychiatric hospitalizations.  Patient last was seen via virtual visit about 6-7 weeks ago.  She reports her mood has been fairly stable until about 3 weeks ago.  She now reports increased symptoms of depression including isolated behaviors, social withdrawal, fatigue, decreased interest in self-care, and depressed mood.  Stressors include financial and marital issues.  Patient is sole breadwinner.  She expresses frustration husband has not found a job.  Per report, he becomes defensive when she  makes suggestions.  She reports additional stress related to her 34-year-old daughter who is having behavioral issues at home and in school.  Patient is in the process of reconnecting daughter to therapy.  Patient reports she did receive handout on bedtime ritual but is having some difficulty implementing due to daughter getting up several times during the night.  Suicidal/Homicidal: Nowithout intent/plan  Therapist Response: Reviewed symptoms, discussed stressors, facilitated expression of thoughts and feelings, validated feelings, praised and reinforced patient's efforts to pursue treatment for her daughter, praised and reinforced patient's efforts to use assertiveness skills, discussed having a lapse of depression and ways to intervene, developed plan with patient to increase efforts regarding self-care in terms of showering/personal make up/dressing for work, assisted patient identify and address thoughts and processes that may inhibit implementation of plan, discussed asking husband for help with getting children ready in the mornings, also discussed talking with husband about taking turns regarding responding to daughter during the night as daughter frequently gets up thereby disrupting patient's sleep pattern, developed plan with patient to resume use of bipolar mood log Plan: Return again in 2 weeks.  Diagnosis: Axis I: MDD    Axis II: No diagnosis    Adah Salvage, LCSW 08/16/2021

## 2021-08-17 NOTE — Telephone Encounter (Signed)
Opened in error

## 2021-09-02 ENCOUNTER — Telehealth: Payer: Self-pay | Admitting: Obstetrics & Gynecology

## 2021-09-02 NOTE — Telephone Encounter (Signed)
Patient stated she went to [facility] last night. They wanted her to follow up within the next two days with her ob/gyn to get an ultrasound and discuss hysterectomy. She has a prolapsed bladder and her uterus is down. The next appointment available was 10/14. I scheduled it then and put her on the wait list.She wanted to know what she needs to do in the mean time to help with it.

## 2021-09-02 NOTE — Telephone Encounter (Signed)
Returned pt's call. Two identifiers used. Informed pt that her concerns had been forwarded to Dr Despina Hidden for advisement. Relayed his return msg to pt. Pt asked if sex was ok, but advised pt to refrain until her appt so as not to make the situation worse or add any complications. Pt confirmed understanding.

## 2021-09-06 ENCOUNTER — Other Ambulatory Visit: Payer: Self-pay

## 2021-09-06 ENCOUNTER — Ambulatory Visit (HOSPITAL_COMMUNITY): Payer: PRIVATE HEALTH INSURANCE | Admitting: Psychiatry

## 2021-09-06 ENCOUNTER — Telehealth (HOSPITAL_COMMUNITY): Payer: Self-pay | Admitting: Psychiatry

## 2021-09-06 NOTE — Telephone Encounter (Signed)
Therapist attempted to contact patient twice via text through caregility platform, no response.  Therapist called patient, left message indicating attempt, and requesting patient call office. 

## 2021-09-08 ENCOUNTER — Telehealth (HOSPITAL_COMMUNITY): Payer: PRIVATE HEALTH INSURANCE | Admitting: Psychiatry

## 2021-09-16 ENCOUNTER — Encounter: Payer: Self-pay | Admitting: Obstetrics & Gynecology

## 2021-09-16 ENCOUNTER — Ambulatory Visit (INDEPENDENT_AMBULATORY_CARE_PROVIDER_SITE_OTHER): Payer: PRIVATE HEALTH INSURANCE | Admitting: Obstetrics & Gynecology

## 2021-09-16 ENCOUNTER — Other Ambulatory Visit: Payer: Self-pay

## 2021-09-16 VITALS — BP 112/74 | HR 102 | Ht 63.0 in | Wt 178.0 lb

## 2021-09-16 DIAGNOSIS — N812 Incomplete uterovaginal prolapse: Secondary | ICD-10-CM | POA: Diagnosis not present

## 2021-09-16 DIAGNOSIS — N816 Rectocele: Secondary | ICD-10-CM

## 2021-09-16 NOTE — Progress Notes (Signed)
Chief Complaint  Patient presents with   Vaginal Prolapse    Wants hysterectomy      35 y.o. [id number] Patient's last menstrual period was 08/23/2021. The current method of family planning is nexplanon.  Outpatient Encounter Medications as of 09/16/2021  Medication Sig   ALPRAZolam (XANAX) 1 MG tablet Take 1 tablet (1 mg total) by mouth 2 (two) times daily as needed for anxiety.   amphetamine-dextroamphetamine (ADDERALL XR) 30 MG 24 hr capsule Take 1 capsule (30 mg total) by mouth daily.   calcium-vitamin D (OSCAL WITH D) 500-200 MG-UNIT tablet Take 1 tablet by mouth.   dicyclomine (BENTYL) 10 MG capsule Take 1 capsule (10 mg total) by mouth 4 (four) times daily -  before meals and at bedtime.   etonogestrel (NEXPLANON) 68 MG IMPL implant 1 each by Subdermal route once.   FLUoxetine (PROZAC) 40 MG capsule TAKE ONE CAPSULE BY MOUTH EVERY DAY   gabapentin (NEURONTIN) 100 MG capsule Take 1 capsule (100 mg total) by mouth 3 (three) times daily.   lisinopril-hydrochlorothiazide (ZESTORETIC) 20-12.5 MG tablet Take 1 tablet by mouth daily.   megestrol (MEGACE) 40 MG tablet Take 3 x 5 days then 2 x 5 days then 1 daily   meloxicam (MOBIC) 7.5 MG tablet TAKE 1 TABLET BY MOUTH DAILY FOR NECK PAIN AND HEADACHE. MAY INCREASE TO 2 TABLEST IF NOT EFFECTIVE   omeprazole (PRILOSEC) 40 MG capsule TAKE 1 CAPSULE(40 MG) BY MOUTH DAILY   QUEtiapine (SEROQUEL) 50 MG tablet Take 1 tablet (50 mg total) by mouth at bedtime.   ondansetron (ZOFRAN) 4 MG tablet Take 1 tablet (4 mg total) by mouth every 6 (six) hours. (Patient not taking: Reported on 09/16/2021)   No facility-administered encounter medications on file as of 09/16/2021.    Subjective Pt went to [facility] ED 08/29/21 with pressure and feeling like sitting on a ball and could see something protruding out of the vagina Also with constipation chronic, no splinting Rare to occasional stress incontinence Has had some dyspareunia with spotting   Has had 2 UTIs in the past year or so  Past Medical History:  Diagnosis Date   Anxiety    Anxiety    Depression    Gestational diabetes mellitus, antepartum    gestational only   History of anemia 2013   History of depression    states stopped med. 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 infection   Hypertension    Osteopenia    Pertussis    Postcoital bleeding 07/14/2015   Sinus infection 01/25/2013   started antibiotic 01/24/2013 x 10 days; nasal congestion, clear drainage from nose   Tonsillar and adenoid hypertrophy 01/2013   Vaginal irritation 03/25/2014   Mild yeast but has history of yeast, will not treat til after first trimester,try yogurt    Past Surgical History:  Procedure Laterality Date   CHOLECYSTECTOMY  04/23/2010   laparoscopic   COLONOSCOPY WITH PROPOFOL N/A 05/17/2016   Procedure: COLONOSCOPY WITH PROPOFOL;  Surgeon: West Bali, MD;  [facility];  Service: Endoscopy;  Laterality: N/A;  1000   HEMORRHOID BANDING N/A 05/17/2016   Procedure: HEMORRHOID BANDING;  Surgeon: West Bali, MD;  [facility];  Service: Endoscopy;  Laterality: N/A;   HEMORRHOID SURGERY N/A 06/08/2016   Procedure: SIMPLE HEMORRHOIDECTOMY;  Surgeon: Ancil Linsey, MD;  Location: [facility];  Service: General;  Laterality: N/A;  PARTIAL NEPHRECTOMY Right age 3 mos.   TONSILLECTOMY AND ADENOIDECTOMY N/A 01/29/2013   Procedure: TONSILLECTOMY AND ADENOIDECTOMY;  Surgeon: Darletta Moll, MD;  Location: [facility];  Service: ENT;  Laterality: N/A;    OB History     Gravida  2   Para  2   Term  1   Preterm  1   AB      Living  2      SAB      IAB      Ectopic      Multiple      Live Births  2           Allergies  Allergen Reactions   Lortab [Hydrocodone-Acetaminophen] Nausea And Vomiting   Morphine And Related Rash    Social History   Socioeconomic History   Marital status: Married     Spouse name: Not on file   Number of children: Not on file   Years of education: Not on file   Highest education level: Not on file  Occupational History   Not on file  Tobacco Use   Smoking status: Never   Smokeless tobacco: Never   Tobacco comments:    Never smoked  Vaping Use   Vaping Use: Never used  Substance and Sexual Activity   Alcohol use: Yes    Alcohol/week: 0.0 standard drinks    Comment: socially   Drug use: No   Sexual activity: Yes    Birth control/protection: Implant    Comment: nexplanon implant  Other Topics Concern   Not on file  Social History Narrative   Not on file   Social Determinants of Health   Financial Resource Strain: Not on file  Food Insecurity: Not on file  Transportation Needs: Not on file  Physical Activity: Not on file  Stress: Not on file  Social Connections: Not on file    Family History  Problem Relation Age of Onset   COPD Mother    Depression Mother    Anxiety disorder Mother    Hypertension Mother    Irritable bowel syndrome Mother    Arthritis Mother    Skin cancer Mother    Thyroid cancer Mother    Ovarian cancer Maternal Aunt    Ovarian cancer Maternal Grandmother    Cancer Maternal Grandmother        bladder, kidney   Hypertension Father    GER disease Father    Bipolar disorder Paternal Uncle    Anxiety disorder Cousin    Drug abuse Cousin    Bipolar disorder Cousin    Colon cancer Neg Hx     Medications:       Current Outpatient Medications:    ALPRAZolam (XANAX) 1 MG tablet, Take 1 tablet (1 mg total) by mouth 2 (two) times daily as needed for anxiety., Disp: 60 tablet, Rfl: 2   amphetamine-dextroamphetamine (ADDERALL XR) 30 MG 24 hr capsule, Take 1 capsule (30 mg total) by mouth daily., Disp: 30 capsule, Rfl: 0   calcium-vitamin D (OSCAL WITH D) 500-200 MG-UNIT tablet, Take 1 tablet by mouth., Disp: , Rfl:    dicyclomine (BENTYL) 10 MG capsule, Take 1 capsule (10 mg total) by mouth 4 (four) times daily -   before meals and at bedtime., Disp: 90 capsule, Rfl: 0   etonogestrel (NEXPLANON) 68 MG IMPL implant, 1 each by Subdermal route once., Disp: , Rfl:    FLUoxetine (PROZAC) 40 MG capsule, TAKE ONE CAPSULE BY MOUTH  EVERY DAY, Disp: 30 capsule, Rfl: 2   gabapentin (NEURONTIN) 100 MG capsule, Take 1 capsule (100 mg total) by mouth 3 (three) times daily., Disp: 90 capsule, Rfl: 0   lisinopril-hydrochlorothiazide (ZESTORETIC) 20-12.5 MG tablet, Take 1 tablet by mouth daily., Disp: , Rfl:    megestrol (MEGACE) 40 MG tablet, Take 3 x 5 days then 2 x 5 days then 1 daily, Disp: 45 tablet, Rfl: 1   meloxicam (MOBIC) 7.5 MG tablet, TAKE 1 TABLET BY MOUTH DAILY FOR NECK PAIN AND HEADACHE. MAY INCREASE TO 2 TABLEST IF NOT EFFECTIVE, Disp: , Rfl:    omeprazole (PRILOSEC) 40 MG capsule, TAKE 1 CAPSULE(40 MG) BY MOUTH DAILY, Disp: 30 capsule, Rfl: 3   QUEtiapine (SEROQUEL) 50 MG tablet, Take 1 tablet (50 mg total) by mouth at bedtime., Disp: 30 tablet, Rfl: 2   ondansetron (ZOFRAN) 4 MG tablet, Take 1 tablet (4 mg total) by mouth every 6 (six) hours. (Patient not taking: Reported on 09/16/2021), Disp: 12 tablet, Rfl: 0  Objective Blood pressure 112/74, pulse (!) 102, height 5\' 3"  (1.6 m), weight 178 lb (80.7 kg), last menstrual period 08/23/2021.  General WDWN female NAD Vulva:  normal appearing vulva with no masses, tenderness or lesions Grade 2 cytocoel, uterine prolapse and rectocoele Vagina:  normal mucosa, no discharge Cervix:  Normal no lesions Uterus:  normal size, contour, position, consistency, mobility, non-tender Adnexa: ovaries:present,  normal adnexa in size, nontender and no masses   Pertinent ROS No burning with urination, frequency or urgency No nausea, vomiting or diarrhea Nor fever chills or other constitutional symptoms   Labs or studies No new    Impression Diagnoses this Encounter::   ICD-10-CM   1. Cystocele with second degree uterine prolapse  N81.2     2. Pelvic organ  prolapse quantification stage 2 rectocele  N81.6       Established relevant diagnosis(es):   Plan/Recommendations: No orders of the defined types were placed in this encounter.   Labs or Scans Ordered: No orders of the defined types were placed in this encounter.   Management:: At some point will need surgery, but recommend pelvic PT intervention to delay as long as possible due to her age   Follow up Return in about 3 months (around 12/17/2021) for interval follow up.       All questions were answered.

## 2021-09-17 ENCOUNTER — Other Ambulatory Visit: Payer: Self-pay | Admitting: *Deleted

## 2021-09-17 DIAGNOSIS — N816 Rectocele: Secondary | ICD-10-CM

## 2021-09-17 DIAGNOSIS — N812 Incomplete uterovaginal prolapse: Secondary | ICD-10-CM

## 2021-09-21 ENCOUNTER — Other Ambulatory Visit: Payer: Self-pay

## 2021-09-21 ENCOUNTER — Ambulatory Visit (HOSPITAL_COMMUNITY): Payer: PRIVATE HEALTH INSURANCE | Admitting: Psychiatry

## 2021-09-21 ENCOUNTER — Telehealth: Payer: Self-pay | Admitting: Orthopedic Surgery

## 2021-09-21 NOTE — Telephone Encounter (Signed)
Patient called and states she has a broken finger and she went to [facility] in [place].  I advised her we will need the CD from the xray and the report and office notes before we can see her.

## 2021-09-23 ENCOUNTER — Encounter (HOSPITAL_COMMUNITY): Payer: Self-pay | Admitting: Psychiatry

## 2021-09-23 ENCOUNTER — Telehealth (INDEPENDENT_AMBULATORY_CARE_PROVIDER_SITE_OTHER): Payer: PRIVATE HEALTH INSURANCE | Admitting: Psychiatry

## 2021-09-23 ENCOUNTER — Other Ambulatory Visit: Payer: Self-pay

## 2021-09-23 DIAGNOSIS — F331 Major depressive disorder, recurrent, moderate: Secondary | ICD-10-CM

## 2021-09-23 DIAGNOSIS — F900 Attention-deficit hyperactivity disorder, predominantly inattentive type: Secondary | ICD-10-CM

## 2021-09-23 MED ORDER — ALPRAZOLAM 1 MG PO TABS: 1.0000 mg | ORAL_TABLET | Freq: Two times a day (BID) | ORAL | 2 refills | Status: DC | PRN

## 2021-09-23 MED ORDER — QUETIAPINE FUMARATE 50 MG PO TABS: 50.0000 mg | ORAL_TABLET | Freq: Every day | ORAL | 2 refills | Status: DC

## 2021-09-23 MED ORDER — AMPHETAMINE-DEXTROAMPHET ER 30 MG PO CP24: 30.0000 mg | ORAL_CAPSULE | Freq: Every day | ORAL | 0 refills | Status: DC

## 2021-09-23 MED ORDER — FLUOXETINE HCL 40 MG PO CAPS: ORAL_CAPSULE | ORAL | 2 refills | Status: DC

## 2021-09-23 NOTE — Progress Notes (Signed)
Virtual Visit via Video Note  I connected with Sandra Glover on 09/23/21 at 11:20 AM EDT by a video enabled telemedicine application and verified that I am speaking with the correct person using two identifiers.  Location: Patient: home Provider: office   I discussed the limitations of evaluation and management by telemedicine and the availability of in person appointments. The patient expressed understanding and agreed to proceed.     I discussed the assessment and treatment plan with the patient. The patient was provided an opportunity to ask questions and all were answered. The patient agreed with the plan and demonstrated an understanding of the instructions.   The patient was advised to call back or seek an in-person evaluation if the symptoms worsen or if the condition fails to improve as anticipated.  I provided 15 minutes of non-face-to-face time during this encounter.   [name], MD  [facility] MD/PA/NP OP Progress Note  09/23/2021 11:35 AM Sandra Glover  MRN:  [id number]  Chief Complaint:  Chief Complaint   Anxiety; Depression; ADD; Follow-up    HPI: This patient is a 35 year old married white female who lives with her husband and two children in [place]. 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  The patient returns for follow-up after 4 weeks.  Since I last saw her she seems to have had a vaginal prolapse.  She has been diagnosed with a cystocele and rectocele.  She has seen her gynecologist.  She has been scheduled for PT but probably will have to have total hysterectomy as well as repair of the bladder and rectum.  She just found this out last week and was quite depressed for a couple of days and even had some passive suicidal ideation.  She is feeling much better now.  Her husband has been very supportive.  She also broke her left index finger in has to see an orthopedic for that as well.  Despite this the patient states that she is  trying to stay optimistic.  She is now on Adderall XR which is really helped her energy and focus.  She is sleeping well.  She is stressed about all that is happening to her health but she denies current thoughts of suicidal ideation.  She thinks the medication is still helping her depression anxiety and focus. Visit Diagnosis:    ICD-10-CM   1. Major depressive disorder, recurrent episode, moderate (HCC)  F33.1 QUEtiapine (SEROQUEL) 50 MG tablet    2. Attention deficit hyperactivity disorder (ADHD), predominantly inattentive type  F90.0       Past Psychiatric History: Long-term outpatient treatment  Past Medical History:  Past Medical History:  Diagnosis Date   Anxiety    Anxiety    Depression    Gestational diabetes mellitus, antepartum    gestational only   History of anemia 2013   History of depression    states stopped med. in 2013   History of partial nephrectomy age 74 mos.   right - due to kidney infection   Hypertension    Osteopenia    Pertussis    Postcoital bleeding 07/14/2015   Sinus infection 01/25/2013   started antibiotic 01/24/2013 x 10 days; nasal congestion, clear drainage from nose   Tonsillar and adenoid hypertrophy 01/2013   Vaginal irritation 03/25/2014   Mild yeast but has history of yeast, will not treat til after first trimester,try yogurt    Past Surgical History:  Procedure Laterality Date   CHOLECYSTECTOMY  04/23/2010   laparoscopic   COLONOSCOPY WITH PROPOFOL N/A 05/17/2016   Procedure: COLONOSCOPY WITH PROPOFOL;  Surgeon: West Bali, MD;  [facility];  Service: Endoscopy;  Laterality: N/A;  1000   HEMORRHOID BANDING N/A 05/17/2016   Procedure: HEMORRHOID BANDING;  Surgeon: West Bali, MD;  [facility];  Service: Endoscopy;  Laterality: N/A;   HEMORRHOID SURGERY N/A 06/08/2016   Procedure: SIMPLE HEMORRHOIDECTOMY;  Surgeon: Ancil Linsey, MD;  Location: [facility];  Service: General;  Laterality: N/A;   PARTIAL NEPHRECTOMY  Right age 76 mos.   TONSILLECTOMY AND ADENOIDECTOMY N/A 01/29/2013   Procedure: TONSILLECTOMY AND ADENOIDECTOMY;  Surgeon: Darletta Moll, MD;  Location: [facility];  Service: ENT;  Laterality: N/A;    Family Psychiatric History: see below  Family History:  Family History  Problem Relation Age of Onset   COPD Mother    Depression Mother    Anxiety disorder Mother    Hypertension Mother    Irritable bowel syndrome Mother    Arthritis Mother    Skin cancer Mother    Thyroid cancer Mother    Ovarian cancer Maternal Aunt    Ovarian cancer Maternal Grandmother    Cancer Maternal Grandmother        bladder, kidney   Hypertension Father    GER disease Father    Bipolar disorder Paternal Uncle    Anxiety disorder Cousin    Drug abuse Cousin    Bipolar disorder Cousin    Colon cancer Neg Hx     Social History:  Social History   Socioeconomic History   Marital status: Married    Spouse name: Not on file   Number of children: Not on file   Years of education: Not on file   Highest education level: Not on file  Occupational History   Not on file  Tobacco Use   Smoking status: Never   Smokeless tobacco: Never   Tobacco comments:    Never smoked  Vaping Use   Vaping Use: Never used  Substance and Sexual Activity   Alcohol use: Yes    Alcohol/week: 0.0 standard drinks    Comment: socially   Drug use: No   Sexual activity: Yes    Birth control/protection: Implant    Comment: nexplanon implant  Other Topics Concern   Not on file  Social History Narrative   Not on file   Social Determinants of Health   Financial Resource Strain: Not on file  Food Insecurity: Not on file  Transportation Needs: Not on file  Physical Activity: Not on file  Stress: Not on file  Social Connections: Not on file    Allergies:  Allergies  Allergen Reactions   Lortab [Hydrocodone-Acetaminophen] Nausea And Vomiting   Morphine And Related Rash    Metabolic Disorder  Labs: No results found for: HGBA1C, MPG No results found for: PROLACTIN No results found for: CHOL, TRIG, HDL, CHOLHDL, VLDL, LDLCALC Lab Results  Component Value Date   TSH 2.553 10/13/2017    Therapeutic Level Labs: No results found for: LITHIUM No results found for: VALPROATE No components found for:  CBMZ  Current Medications: Current Outpatient Medications  Medication Sig Dispense Refill   ALPRAZolam (XANAX) 1 MG tablet Take 1 tablet (1 mg total) by mouth 2 (two) times daily as needed for anxiety. 60 tablet 2   amphetamine-dextroamphetamine (ADDERALL XR) 30 MG 24 hr capsule Take 1 capsule (30 mg total) by mouth daily. 30  capsule 0   calcium-vitamin D (OSCAL WITH D) 500-200 MG-UNIT tablet Take 1 tablet by mouth.     dicyclomine (BENTYL) 10 MG capsule Take 1 capsule (10 mg total) by mouth 4 (four) times daily -  before meals and at bedtime. 90 capsule 0   etonogestrel (NEXPLANON) 68 MG IMPL implant 1 each by Subdermal route once.     FLUoxetine (PROZAC) 40 MG capsule TAKE ONE CAPSULE BY MOUTH EVERY DAY 30 capsule 2   gabapentin (NEURONTIN) 100 MG capsule Take 1 capsule (100 mg total) by mouth 3 (three) times daily. 90 capsule 0   lisinopril-hydrochlorothiazide (ZESTORETIC) 20-12.5 MG tablet Take 1 tablet by mouth daily.     megestrol (MEGACE) 40 MG tablet Take 3 x 5 days then 2 x 5 days then 1 daily 45 tablet 1   meloxicam (MOBIC) 7.5 MG tablet TAKE 1 TABLET BY MOUTH DAILY FOR NECK PAIN AND HEADACHE. MAY INCREASE TO 2 TABLEST IF NOT EFFECTIVE     omeprazole (PRILOSEC) 40 MG capsule TAKE 1 CAPSULE(40 MG) BY MOUTH DAILY 30 capsule 3   ondansetron (ZOFRAN) 4 MG tablet Take 1 tablet (4 mg total) by mouth every 6 (six) hours. (Patient not taking: Reported on 09/16/2021) 12 tablet 0   QUEtiapine (SEROQUEL) 50 MG tablet Take 1 tablet (50 mg total) by mouth at bedtime. 30 tablet 2   No current facility-administered medications for this visit.     Musculoskeletal: Strength & Muscle  Tone: within normal limits Gait & Station: normal Patient leans: N/A  Psychiatric Specialty Exam: Review of Systems  Genitourinary:  Positive for frequency and vaginal pain.  Musculoskeletal:  Positive for arthralgias.  Psychiatric/Behavioral:  The patient is nervous/anxious.   All other systems reviewed and are negative.  There were no vitals taken for this visit.There is no height or weight on file to calculate BMI.  General Appearance: Casual, Neat, and Well Groomed  Eye Contact:  Good  Speech:  Clear and Coherent  Volume:  Normal  Mood:  Anxious  Affect:  Appropriate and Congruent  Thought Process:  Goal Directed  Orientation:  Full (Time, Place, and Person)  Thought Content: WDL   Suicidal Thoughts:  No  Homicidal Thoughts:  No  Memory:  Immediate;   Good Recent;   Good Remote;   Good  Judgement:  Good  Insight:  Good  Psychomotor Activity:  Normal  Concentration:  Concentration: Good and Attention Span: Good  Recall:  Good  Fund of Knowledge: Good  Language: Good  Akathisia:  No  Handed:  Right  AIMS (if indicated): not done  Assets:  Communication Skills Desire for Improvement Resilience Social Support Talents/Skills Vocational/Educational  ADL's:  Intact  Cognition: WNL  Sleep:  Good   Screenings: GAD-7    Flowsheet Row Counselor from 09/27/2018 in [facility]  Total GAD-7 Score 18      PHQ2-9    Flowsheet Row Video Visit from 09/23/2021 in [facility] Video Visit from 08/13/2021 in [facility] Video Visit from 07/14/2021 in [facility] Counselor from 05/13/2021 in [facility] Video Visit from 03/17/2021 in [facility]  PHQ-2 Total Score 1 2 2 2 1   PHQ-9 Total Score 4 7 4 10  --      Flowsheet Row  Video Visit from 09/23/2021 in [facility] Video Visit from 08/13/2021 in [facility] Video Visit  from 07/14/2021 in [facility]  C-SSRS RISK CATEGORY Error: Q3, 4, or 5 should not be populated when Q2 is No No Risk No Risk        Assessment and Plan: This patient is a 35 year old female with a history of depression anxiety ADD and possible bipolar 2 disorder.  She has had some bad news regarding her health and the need for total hysterectomy as well as other surgical repairs.  She is very worried about it but is trying to make the best of each day.  She does think her medications have been helpful.  She will continue Adderall XR 30 mg every morning for ADD, Seroquel 50 mg at bedtime for mood stabilization, Prozac 40 mg for depression and Xanax 1 mg twice daily as needed for anxiety.  She will continue her counseling and return to see me in 4 weeks  [name], MD 09/23/2021, 11:35 AM

## 2021-09-24 ENCOUNTER — Telehealth (HOSPITAL_COMMUNITY): Payer: Self-pay

## 2021-09-24 ENCOUNTER — Other Ambulatory Visit (HOSPITAL_COMMUNITY): Payer: Self-pay | Admitting: Psychiatry

## 2021-09-24 MED ORDER — AMPHETAMINE-DEXTROAMPHET ER 30 MG PO CP24: 30.0000 mg | ORAL_CAPSULE | Freq: Every day | ORAL | 0 refills | Status: DC

## 2021-09-24 NOTE — Telephone Encounter (Signed)
Patient called regarding her Adderall XR 30mg . Patient stated that she stopped by [facility] in [place] to pick it up, where medication was sent in to, but was told that they don't have it in stock. Patient would like it resent to [facility] on [address] in [place]. Please review and advise. Thank you

## 2021-09-24 NOTE — Telephone Encounter (Signed)
sent 

## 2021-09-27 ENCOUNTER — Telehealth (HOSPITAL_COMMUNITY): Payer: Self-pay | Admitting: Psychiatry

## 2021-09-27 NOTE — Telephone Encounter (Signed)
Patients requesting medication refills, please call once processed

## 2021-09-28 ENCOUNTER — Ambulatory Visit (INDEPENDENT_AMBULATORY_CARE_PROVIDER_SITE_OTHER): Payer: PRIVATE HEALTH INSURANCE | Admitting: Orthopedic Surgery

## 2021-09-28 ENCOUNTER — Other Ambulatory Visit: Payer: Self-pay

## 2021-09-28 ENCOUNTER — Encounter: Payer: Self-pay | Admitting: Orthopedic Surgery

## 2021-09-28 VITALS — BP 124/80 | HR 74 | Ht 63.0 in | Wt 177.0 lb

## 2021-09-28 DIAGNOSIS — S62621A Displaced fracture of medial phalanx of left index finger, initial encounter for closed fracture: Secondary | ICD-10-CM | POA: Diagnosis not present

## 2021-09-28 NOTE — Progress Notes (Signed)
Orthopaedic Clinic Return  Assessment: Sandra Glover is a 35 y.o. female with the following: Left index finger fracture at the base of the middle phalanx  Plan: Reviewed radiographs in clinic today, which demonstrates a small, fracture of the volar lip of the middle phalanx of the left index finger.  She states that the pain, swelling and bruising has significantly improved since she sustained the injury.  I would like to continue with immobilization, and I provided her with a much smaller volar-based splint, which will immobilize the PIP to the left index finger.  She is to wear this at all time, with the exception of hygiene.  We will see her back in approximately 2 weeks for repeat evaluation.  No orders of the defined types were placed in this encounter.   Body mass index is 31.35 kg/m.  Follow-up: Return in about 2 weeks (around 10/12/2021).   Subjective:  Chief Complaint  Patient presents with   Hand Injury    Lt hand Fx finger/ DOI 09/19/21    History of Present Illness: Sandra Glover is a 35 y.o. RHD female who returns to clinic for evaluation of a left finger injury.  She states that she was walking her dog, when she got her finger caught, and sustained an injury to her left index finger.  She is unsure how the injury specifically happened.  Nonetheless, she noted pain immediately.  She also had some swelling and bruising.  This is improved.  She was evaluated in an urgent care center, and was deemed to have a small fracture of the middle phalanx.  She is worn a volar-based AlumaFoam splint.  She is taking over-the-counter pain medications as needed.  No numbness or tingling.  Review of Systems: No fevers or chills No numbness or tingling No chest pain No shortness of breath No bowel or bladder dysfunction No GI distress No headaches   Objective: BP 124/80   Pulse 74   Ht 5\' 3"  (1.6 m)   Wt 177 lb (80.3 kg)   BMI 31.35 kg/m   Physical Exam:  Alert  and oriented.  No acute distress.  Normal gait  Evaluation of the left index finger demonstrates mild swelling.  No bruising is appreciated.  She has tenderness to palpation at the base of the middle phalanx.  She is able to flex her MCP, as well as the DIP joint.  Limited flexion at the PIP due to pain.  Sensation is intact throughout the index finger.  Fingers are warm well perfused.  2+ radial pulse.  IMAGING: I personally ordered and reviewed the following images:  X-rays of the left index finger were obtained in the emergency care center, and demonstrates a small fracture at the volar lip of the middle phalanx to the index finger.  This is minimally displaced.  No other injuries are noted.   , MD 09/28/2021 11:02 PM

## 2021-10-05 ENCOUNTER — Other Ambulatory Visit: Payer: Self-pay

## 2021-10-05 ENCOUNTER — Ambulatory Visit (HOSPITAL_COMMUNITY): Payer: PRIVATE HEALTH INSURANCE | Admitting: Psychiatry

## 2021-10-05 DIAGNOSIS — F331 Major depressive disorder, recurrent, moderate: Secondary | ICD-10-CM

## 2021-10-05 NOTE — Progress Notes (Signed)
Opened in error

## 2021-10-12 ENCOUNTER — Ambulatory Visit (INDEPENDENT_AMBULATORY_CARE_PROVIDER_SITE_OTHER): Payer: PRIVATE HEALTH INSURANCE | Admitting: Orthopedic Surgery

## 2021-10-12 ENCOUNTER — Encounter: Payer: Self-pay | Admitting: Orthopedic Surgery

## 2021-10-12 ENCOUNTER — Ambulatory Visit: Payer: PRIVATE HEALTH INSURANCE

## 2021-10-12 ENCOUNTER — Other Ambulatory Visit: Payer: Self-pay

## 2021-10-12 VITALS — Ht 63.0 in | Wt 177.0 lb

## 2021-10-12 DIAGNOSIS — S62321D Displaced fracture of shaft of second metacarpal bone, left hand, subsequent encounter for fracture with routine healing: Secondary | ICD-10-CM

## 2021-10-12 NOTE — Progress Notes (Signed)
Orthopaedic Clinic Return  Assessment: Sandra Glover is a 35 y.o. female with the following: Left index finger fracture at the base of the middle phalanx  Plan: Radiographs stable.  Pain is improving.  Continue to splint PIP to left index finger for another week at all times.  After that, ok to remove splint and work on gentle ROM.  Continue to avoid weightbearing and power grip.  Wear splint when active or not in a controlled setting.     Follow-up: Return in about 3 weeks (around 11/02/2021).   Subjective:  Chief Complaint  Patient presents with   Fracture    Lt hand index finger DOI 09/19/21    History of Present Illness: Sandra Glover is a 35 y.o. RHD female who returns to clinic for repeat evaluation of a left finger injury.  Approximately 3 weeks ago, she injured the finger.  Her pain and swelling is improving.  She has been using the splint at all times except for hygiene.  No additional injury.   Review of Systems: No fevers or chills No numbness or tingling No chest pain No shortness of breath No bowel or bladder dysfunction No GI distress No headaches   Objective: Ht 5\' 3"  (1.6 m)   Wt 177 lb (80.3 kg)   BMI 31.35 kg/m   Physical Exam:  Alert and oriented.  No acute distress.  Normal gait  Left index finger with minimal swelling.  No bruising.  Tenderness to palpation at the PIP.  Some pain with gentle flexion.  Tolerates movement at the DIP and MCP.  Sensation intact to finger.  Finger is warm and well perfused.   IMAGING: I personally ordered and reviewed the following images:  XR of the left index finger demonstrates a minimally displaced fracture at the volar lip of the base of the middle phalanx.  No interval displacement.  PIP joint is appropriately aligned.  No dislocation or subluxation.    Impression: minimally displaced fracture at the base of the middle phalanx of the left index finger.  Near anatomic alignment.    ,  MD 10/12/2021 10:10 AM

## 2021-10-12 NOTE — Patient Instructions (Signed)
Splint at all times except hygiene for another week.  After that you can remove the splint when not active and start with some gentle motion.  No power grip or lifting with the index finger

## 2021-10-18 ENCOUNTER — Other Ambulatory Visit (HOSPITAL_COMMUNITY): Payer: Self-pay | Admitting: Psychiatry

## 2021-10-18 DIAGNOSIS — F331 Major depressive disorder, recurrent, moderate: Secondary | ICD-10-CM

## 2021-10-19 ENCOUNTER — Telehealth (HOSPITAL_COMMUNITY): Payer: Self-pay | Admitting: Psychiatry

## 2021-10-19 ENCOUNTER — Ambulatory Visit (HOSPITAL_COMMUNITY): Payer: PRIVATE HEALTH INSURANCE | Admitting: Psychiatry

## 2021-10-19 ENCOUNTER — Other Ambulatory Visit: Payer: Self-pay

## 2021-10-19 NOTE — Telephone Encounter (Signed)
Therapist attempted to contact patient twice via text through caregility platform for scheduled appointment, no response.  Therapist called patient, left message indicating attempt, and requesting patient call office. 

## 2021-10-31 ENCOUNTER — Ambulatory Visit
Admission: EM | Admit: 2021-10-31 | Discharge: 2021-10-31 | Disposition: A | Discharge status: Home / Self Care | Payer: PRIVATE HEALTH INSURANCE | Attending: Family Medicine | Admitting: Family Medicine

## 2021-10-31 ENCOUNTER — Other Ambulatory Visit: Payer: Self-pay

## 2021-10-31 DIAGNOSIS — J111 Influenza due to unidentified influenza virus with other respiratory manifestations: Secondary | ICD-10-CM

## 2021-10-31 DIAGNOSIS — Z20828 Contact with and (suspected) exposure to other viral communicable diseases: Secondary | ICD-10-CM

## 2021-10-31 MED ORDER — OSELTAMIVIR PHOSPHATE 75 MG PO CAPS: 75.0000 mg | ORAL_CAPSULE | Freq: Two times a day (BID) | ORAL | 0 refills | Status: DC

## 2021-10-31 NOTE — ED Triage Notes (Signed)
Patient states she started feeling bad yesterday morning wit a bad headache and coughing. She is experiencing yellow mucus and hoarse voice   Denies Meds.  Denies Fever.

## 2021-10-31 NOTE — ED Provider Notes (Signed)
[facility]    CSN: [id number] Arrival date & time: 10/31/21  1229      History   Chief Complaint No chief complaint on file.   HPI Sandra Glover is a 35 y.o. female.   HPI Cough, hoarseness, body aches, diarrhea x 1 day. Patient is here for viral respiratory testing. Patient is currently afebrile. Denies any difficulty breathing, wheezing or history of chronic respiratory disease. Past Medical History:  Diagnosis Date   Anxiety    Anxiety    Depression    Gestational diabetes mellitus, antepartum    gestational only   History of anemia 2013   History of depression    states stopped med. 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   Hypertension    Osteopenia    Pertussis    Postcoital bleeding 07/14/2015   Sinus infection 01/25/2013   started antibiotic 01/24/2013 x 10 days; nasal congestion, clear drainage from nose   Tonsillar and adenoid hypertrophy 01/2013   Vaginal irritation 03/25/2014   Mild yeast but has history of yeast, will not treat til after first trimester,try yogurt    Patient Active Problem List   Diagnosis Date Noted   Major depressive disorder, recurrent episode, moderate (HCC) 06/22/2021   Diarrhea 12/26/2018   Abdominal pain 12/26/2018   Nexplanon insertion 09/25/2017   Hemorrhoids 09/19/2016   Rectal bleeding 03/24/2016   Constipation 03/24/2016   History of preterm delivery 09/09/2014   Pertussis 07/30/2014   History of gestational diabetes 07/24/2014   Cystic fibrosis carrier, 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   Depression with anxiety 03/03/2014    Past Surgical History:  Procedure Laterality Date   CHOLECYSTECTOMY  04/23/2010   laparoscopic   COLONOSCOPY WITH PROPOFOL N/A 05/17/2016   Procedure: COLONOSCOPY WITH PROPOFOL;  Surgeon: West Bali, MD;  Location: [facility];  Service: Endoscopy;  Laterality: N/A;  1000   HEMORRHOID BANDING N/A 05/17/2016   Procedure: HEMORRHOID  BANDING;  Surgeon: West Bali, MD;  Location: [facility];  Service: Endoscopy;  Laterality: N/A;   HEMORRHOID SURGERY N/A 06/08/2016   Procedure: SIMPLE HEMORRHOIDECTOMY;  Surgeon: Ancil Linsey, MD;  Location: [facility];  Service: General;  Laterality: N/A;   PARTIAL NEPHRECTOMY Right age 26 mos.   TONSILLECTOMY AND ADENOIDECTOMY N/A 01/29/2013   Procedure: TONSILLECTOMY AND ADENOIDECTOMY;  Surgeon: Darletta Moll, MD;  Location: [facility];  Service: ENT;  Laterality: N/A;    OB History     Gravida  2   Para  2   Term  1   Preterm  1   AB      Living  2      SAB      IAB      Ectopic      Multiple      Live Births  2            Home Medications    Prior to Admission medications   Medication Sig Start Date End Date Taking? Authorizing Provider  oseltamivir (TAMIFLU) 75 MG capsule Take 1 capsule (75 mg total) by mouth 2 (two) times daily. 10/31/21  Yes Bing Neighbors, FNP  ALPRAZolam Prudy Feeler) 1 MG tablet Take 1 tablet (1 mg total) by mouth 2 (two) times daily as needed for anxiety. 09/23/21 09/23/22  Myrlene Broker, MD  amphetamine-dextroamphetamine (ADDERALL XR) 30 MG 24 hr capsule Take 1 capsule (30 mg total)  by mouth daily. 11/02/21   Myrlene Broker, MD  calcium-vitamin D (OSCAL WITH D) 500-200 MG-UNIT tablet Take 1 tablet by mouth.    [provider]  dicyclomine (BENTYL) 10 MG capsule Take 1 capsule (10 mg total) by mouth 4 (four) times daily -  before meals and at bedtime. 06/17/19   Anice Paganini, NP  etonogestrel (NEXPLANON) 68 MG IMPL implant 1 each by Subdermal route once.    [provider]  FLUoxetine (PROZAC) 40 MG capsule TAKE ONE CAPSULE BY MOUTH EVERY DAY 09/23/21   Myrlene Broker, MD  gabapentin (NEURONTIN) 100 MG capsule Take 1 capsule (100 mg total) by mouth 3 (three) times daily. 06/17/21   Oliver Barre, MD  lisinopril-hydrochlorothiazide (ZESTORETIC) 20-12.5 MG tablet Take 1 tablet by mouth daily.     [provider]  megestrol (MEGACE) 40 MG tablet Take 3 x 5 days then 2 x 5 days then 1 daily 02/04/20   Cyril Mourning A, NP  meloxicam (MOBIC) 7.5 MG tablet TAKE 1 TABLET BY MOUTH DAILY FOR NECK PAIN AND HEADACHE. MAY INCREASE TO 2 TABLEST IF NOT EFFECTIVE 08/15/19   [provider]  omeprazole (PRILOSEC) 40 MG capsule TAKE 1 CAPSULE(40 MG) BY MOUTH DAILY 06/09/21   Gelene Mink, NP  ondansetron (ZOFRAN) 4 MG tablet Take 1 tablet (4 mg total) by mouth every 6 (six) hours. Patient not taking: No sig reported 04/27/21   Eber Hong, MD  QUEtiapine (SEROQUEL) 50 MG tablet Take 1 tablet (50 mg total) by mouth at bedtime. 09/23/21   Myrlene Broker, MD    Family History Family History  Problem Relation Age of Onset   COPD Mother    Depression Mother    Anxiety disorder Mother    Hypertension Mother    Irritable bowel syndrome Mother    Arthritis Mother    Skin cancer Mother    Thyroid cancer Mother    Ovarian cancer Maternal Aunt    Ovarian cancer Maternal Grandmother    Cancer Maternal Grandmother        bladder, kidney   Hypertension Father    GER disease Father    Bipolar disorder Paternal Uncle    Anxiety disorder Cousin    Drug abuse Cousin    Bipolar disorder Cousin    Colon cancer Neg Hx     Social History Social History   Tobacco Use   Smoking status: Never   Smokeless tobacco: Never   Tobacco comments:    Never smoked  Vaping Use   Vaping Use: Never used  Substance Use Topics   Alcohol use: Yes    Alcohol/week: 0.0 standard drinks    Comment: socially   Drug use: No     Allergies   Lortab [hydrocodone-acetaminophen] and Morphine and related   Review of Systems Review of Systems Pertinent negatives listed in HPI   Physical Exam Triage Vital Signs ED Triage Vitals  Enc Vitals Group     BP 10/31/21 1634 115/76     Pulse Rate 10/31/21 1634 77     Resp 10/31/21 1634 16     Temp 10/31/21 1634 97.9 F (36.6 C)     Temp Source  10/31/21 1634 Tympanic     SpO2 10/31/21 1634 97 %     Weight --      Height --      Head Circumference --      Peak Flow --      Pain Score  10/31/21 1631 5     Pain Loc --      Pain Edu? --      Excl. in GC? --    No data found.  Updated Vital Signs BP 115/76 (BP Location: Right Arm)   Pulse 77   Temp 97.9 F (36.6 C) (Tympanic)   Resp 16   SpO2 97%   Visual Acuity Right Eye Distance:   Left Eye Distance:   Bilateral Distance:    Right Eye Near:   Left Eye Near:    Bilateral Near:     Physical Exam   General Appearance:    Alert, cooperative, no distress  HENT:   Normocephalic, ears normal, nares mucosal edema with congestion, rhinorrhea, oropharynx  clear   Eyes:    PERRL, conjunctiva/corneas clear, EOM's intact       Lungs:     Clear to auscultation bilaterally, respirations unlabored  Heart:    Regular rate and rhythm  Neurologic:   Awake, alert, oriented x 3. No apparent focal neurological           defect.     UC Treatments / Results  Labs (all labs ordered are listed, but only abnormal results are displayed) Labs Reviewed  COVID-19, FLU A+B NAA   Narrative:    Performed at:  [address] [place], [place]  [id number] Lab Director: Jolene Schimke MD, Phone:  [phone]    EKG   Radiology No results found.  Procedures Procedures (including critical care time)  Medications Ordered in UC Medications - No data to display  Initial Impression / Assessment and Plan / UC Course  I have reviewed the triage vital signs and the nursing notes.  Pertinent labs & imaging results that were available during my care of the patient were reviewed by me and considered in my medical decision making (see chart for details).    Influenza-like illness and close exposure we will treat with Tamiflu given patient's close exposure. Viral panel test pending. Symptom management warranted only.  Manage fever with Tylenol and ibuprofen.  Nasal  symptoms with over-the-counter antihistamines recommended.  Treatment per discharge medications/discharge instructions.  Red flags/ER precautions given. The most current CDC isolation/quarantine recommendation advised.    Final Clinical Impressions(s) / UC Diagnoses   Final diagnoses:  Exposure to the flu  Influenza-like illness   Discharge Instructions   None    ED Prescriptions     Medication Sig Dispense Auth. Provider   oseltamivir (TAMIFLU) 75 MG capsule Take 1 capsule (75 mg total) by mouth 2 (two) times daily. 10 capsule Bing Neighbors, FNP      PDMP not reviewed this encounter.   Bing Neighbors, FNP 11/03/21 2115

## 2021-11-01 LAB — COVID-19, FLU A+B NAA
Influenza A, NAA: NOT DETECTED
Influenza B, NAA: NOT DETECTED
SARS-CoV-2, NAA: NOT DETECTED

## 2021-11-02 ENCOUNTER — Ambulatory Visit: Payer: PRIVATE HEALTH INSURANCE | Admitting: Orthopedic Surgery

## 2021-11-02 ENCOUNTER — Telehealth (HOSPITAL_COMMUNITY): Payer: Self-pay | Admitting: *Deleted

## 2021-11-02 ENCOUNTER — Other Ambulatory Visit (HOSPITAL_COMMUNITY): Payer: Self-pay | Admitting: Psychiatry

## 2021-11-02 MED ORDER — AMPHETAMINE-DEXTROAMPHET ER 30 MG PO CP24
30.0000 mg | ORAL_CAPSULE | Freq: Every day | ORAL | 0 refills | Status: DC
Start: 2021-11-02 — End: 2021-11-12

## 2021-11-02 NOTE — Telephone Encounter (Signed)
Patient called and [facility] requesting refills for her Adderall XR sent to her pharmacy.

## 2021-11-02 NOTE — Telephone Encounter (Signed)
sent 

## 2021-11-04 ENCOUNTER — Telehealth (HOSPITAL_COMMUNITY): Payer: Self-pay | Admitting: *Deleted

## 2021-11-04 NOTE — Telephone Encounter (Signed)
Staff called home and mobile number on file and [facility] for patient to all office to schedule an appt. Office number was provided on voicemail.

## 2021-11-04 NOTE — Telephone Encounter (Signed)
Staff called patient 11/02/2021 to schedule a f/u appt for patient due to provider request. Also due to patient have not had a f/u with provider for awhile.   Staff was not able to reach patient to schedule appt and staff [facility]

## 2021-11-04 NOTE — Telephone Encounter (Signed)
Staff called pt to schedule appt and was not able to reach pt.

## 2021-11-06 ENCOUNTER — Other Ambulatory Visit: Payer: Self-pay

## 2021-11-06 ENCOUNTER — Emergency Department (HOSPITAL_COMMUNITY)
Admission: EM | Admit: 2021-11-06 | Discharge: 2021-11-07 | Disposition: A | Discharge status: Home / Self Care | Payer: PRIVATE HEALTH INSURANCE | Attending: Emergency Medicine | Admitting: Emergency Medicine

## 2021-11-06 ENCOUNTER — Encounter (HOSPITAL_COMMUNITY): Payer: Self-pay

## 2021-11-06 DIAGNOSIS — S50812A Abrasion of left forearm, initial encounter: Secondary | ICD-10-CM | POA: Insufficient documentation

## 2021-11-06 DIAGNOSIS — R4589 Other symptoms and signs involving emotional state: Secondary | ICD-10-CM

## 2021-11-06 DIAGNOSIS — Z79899 Other long term (current) drug therapy: Secondary | ICD-10-CM | POA: Diagnosis not present

## 2021-11-06 DIAGNOSIS — S0990XA Unspecified injury of head, initial encounter: Secondary | ICD-10-CM | POA: Insufficient documentation

## 2021-11-06 DIAGNOSIS — X788XXA Intentional self-harm by other sharp object, initial encounter: Secondary | ICD-10-CM | POA: Diagnosis not present

## 2021-11-06 DIAGNOSIS — N9489 Other specified conditions associated with female genital organs and menstrual cycle: Secondary | ICD-10-CM | POA: Diagnosis not present

## 2021-11-06 DIAGNOSIS — Z23 Encounter for immunization: Secondary | ICD-10-CM | POA: Insufficient documentation

## 2021-11-06 DIAGNOSIS — I10 Essential (primary) hypertension: Secondary | ICD-10-CM | POA: Diagnosis not present

## 2021-11-06 DIAGNOSIS — K449 Diaphragmatic hernia without obstruction or gangrene: Secondary | ICD-10-CM | POA: Insufficient documentation

## 2021-11-06 DIAGNOSIS — S00212A Abrasion of left eyelid and periocular area, initial encounter: Secondary | ICD-10-CM | POA: Diagnosis not present

## 2021-11-06 DIAGNOSIS — T148XXA Other injury of unspecified body region, initial encounter: Secondary | ICD-10-CM

## 2021-11-06 LAB — CBC WITH DIFFERENTIAL/PLATELET
Abs Immature Granulocytes: 0.07 10*3/uL (ref 0.00–0.07)
Basophils Absolute: 0.1 10*3/uL (ref 0.0–0.1)
Basophils Relative: 0 %
Eosinophils Absolute: 0.2 10*3/uL (ref 0.0–0.5)
Eosinophils Relative: 2 %
HCT: 39.4 % (ref 36.0–46.0)
Hemoglobin: 13.5 g/dL (ref 12.0–15.0)
Immature Granulocytes: 1 %
Lymphocytes Relative: 25 %
Lymphs Abs: 3.3 10*3/uL (ref 0.7–4.0)
MCH: 33.6 pg (ref 26.0–34.0)
MCHC: 34.3 g/dL (ref 30.0–36.0)
MCV: 98 fL (ref 80.0–100.0)
Monocytes Absolute: 0.8 10*3/uL (ref 0.1–1.0)
Monocytes Relative: 6 %
Neutro Abs: 8.6 10*3/uL — ABNORMAL HIGH (ref 1.7–7.7)
Neutrophils Relative %: 66 %
Platelets: 238 10*3/uL (ref 150–400)
RBC: 4.02 MIL/uL (ref 3.87–5.11)
RDW: 12.3 % (ref 11.5–15.5)
WBC: 13.1 10*3/uL — ABNORMAL HIGH (ref 4.0–10.5)
nRBC: 0 % (ref 0.0–0.2)

## 2021-11-06 LAB — COMPREHENSIVE METABOLIC PANEL
ALT: 17 U/L (ref 0–44)
AST: 21 U/L (ref 15–41)
Albumin: 4.2 g/dL (ref 3.5–5.0)
Alkaline Phosphatase: 102 U/L (ref 38–126)
Anion gap: 10 (ref 5–15)
BUN: 13 mg/dL (ref 6–20)
CO2: 24 mmol/L (ref 22–32)
Calcium: 8.7 mg/dL — ABNORMAL LOW (ref 8.9–10.3)
Chloride: 105 mmol/L (ref 98–111)
Creatinine, Ser: 0.75 mg/dL (ref 0.44–1.00)
GFR, Estimated: >60 mL/min (ref >60)
Glucose, Bld: 93 mg/dL (ref 70–99)
Potassium: 3.5 mmol/L (ref 3.5–5.1)
Sodium: 139 mmol/L (ref 135–145)
Total Bilirubin: 0.2 mg/dL — ABNORMAL LOW (ref 0.3–1.2)
Total Protein: 6.8 g/dL (ref 6.5–8.1)

## 2021-11-06 LAB — RAPID URINE DRUG SCREEN, HOSP PERFORMED
Amphetamines: POSITIVE — AB
Barbiturates: NOT DETECTED
Benzodiazepines: POSITIVE — AB
Cocaine: NOT DETECTED
Opiates: NOT DETECTED
Tetrahydrocannabinol: NOT DETECTED

## 2021-11-06 LAB — ETHANOL: Alcohol, Ethyl (B): 103 mg/dL — ABNORMAL HIGH (ref <10)

## 2021-11-06 NOTE — ED Triage Notes (Signed)
Pt reports she has been feeling depressed since having the flu and being stuck in the house with her 4 kids, reports behavorial issues with her 33 yr old daughter. Tonight she reports she used a [employer], operational" to cut her arm. Superficial scratches noted to left anterior forearm, no bleeding noted. She also states she used a tree to "knock myself out" and has small abrasion to left eyelid. No LOC. When asked if she did this to end her life stated "no, I just wanted to cause pain." When asked if she was having suicidal thoughts she replied no. When asked if she wanted to go to sleep and never wake up she replied no. When she asked if she wanted to end her life she replied no.

## 2021-11-06 NOTE — ED Provider Notes (Signed)
[facility] EMERGENCY DEPARTMENT Provider Note   CSN: [id number] Arrival date & time: 11/06/21  2227     History Chief Complaint  Patient presents with   Mental Health Problem    Sandra Glover is a 35 y.o. female.  The history is provided by the patient and the spouse.  Mental Health Problem Presenting symptoms: self-mutilation   Degree of incapacity (severity):  Moderate Onset quality:  Sudden Timing:  Constant Progression:  Improving Chronicity:  New Context: not alcohol use and not drug abuse   Relieved by:  Nothing Worsened by:  Family interactions Associated symptoms: headaches   Patient with history of depression and anxiety presents after trying to harm her self.  Patient reports she has been having issues at home with her child and also recently being sick with influenza.  She reports significant stressors at home.  She reports earlier in the night, she became very frustrated and left her house and went into the woods.  She reports she tried cutting her left arm and tried to "knock" herself out with a piece of wood.  No LOC or vomiting. She reports mild headache and left arm pain. She now reports what she did was wrong and she has no thoughts of suicidal harm, denies any thoughts of harm to others. She reports previous history of trying to harm her self several years ago but nothing recently.  No recent drug or alcohol abuse.  She reports long force that was called, but her family found her in the woods. 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  However some of her counseling sessions have been canceled recently due to geography issues as she works in [place] and her therapist only has a license in [place]    Past Medical History:  Diagnosis Date   Anxiety    Anxiety    Depression    History of anemia 2013   History of depression    states stopped med. 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  - due to kidney infection   Hypertension    Osteopenia    Pertussis    Postcoital bleeding 07/14/2015   Sinus infection 01/25/2013   started antibiotic 01/24/2013 x 10 days; nasal congestion, clear drainage from nose   Tonsillar and adenoid hypertrophy 01/2013   Vaginal irritation 03/25/2014   Mild yeast but has history of yeast, will not treat til after first trimester,try yogurt    Patient Active Problem List   Diagnosis Date Noted   Major depressive disorder, recurrent episode, moderate (HCC) 06/22/2021   Diarrhea 12/26/2018   Abdominal pain 12/26/2018   Nexplanon insertion 09/25/2017   Hemorrhoids 09/19/2016   Rectal bleeding 03/24/2016   Constipation 03/24/2016   History of preterm delivery 09/09/2014   Pertussis 07/30/2014   History of gestational diabetes 07/24/2014   Cystic fibrosis carrier, 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   Depression with anxiety 03/03/2014    Past Surgical History:  Procedure Laterality Date   CHOLECYSTECTOMY  04/23/2010   laparoscopic   COLONOSCOPY WITH PROPOFOL N/A 05/17/2016   Procedure: COLONOSCOPY WITH PROPOFOL;  Surgeon: West Bali, MD;  [facility];  Service: Endoscopy;  Laterality: N/A;  1000   HEMORRHOID BANDING N/A 05/17/2016   Procedure: HEMORRHOID BANDING;  Surgeon: West Bali, MD;  Location[facility];  Service: Endoscopy;  Laterality: N/A;   HEMORRHOID SURGERY N/A 06/08/2016   Procedure: SIMPLE HEMORRHOIDECTOMY;  Surgeon: Barbara Cower  Isaac Laud, MD;  Location: [facility] ORS;  Service: General;  Laterality: N/A;   PARTIAL NEPHRECTOMY Right age 28 mos.   TONSILLECTOMY AND ADENOIDECTOMY N/A 01/29/2013   Procedure: TONSILLECTOMY AND ADENOIDECTOMY;  Surgeon: Darletta Moll, MD;  Location: [facility];  Service: ENT;  Laterality: N/A;     OB History     Gravida  2   Para  2   Term  1   Preterm  1   AB      Living  2      SAB      IAB      Ectopic      Multiple      Live Births  2           Family History   Problem Relation Age of Onset   COPD Mother    Depression Mother    Anxiety disorder Mother    Hypertension Mother    Irritable bowel syndrome Mother    Arthritis Mother    Skin cancer Mother    Thyroid cancer Mother    Ovarian cancer Maternal Aunt    Ovarian cancer Maternal Grandmother    Cancer Maternal Grandmother        bladder, kidney   Hypertension Father    GER disease Father    Bipolar disorder Paternal Uncle    Anxiety disorder Cousin    Drug abuse Cousin    Bipolar disorder Cousin    Colon cancer Neg Hx     Social History   Tobacco Use   Smoking status: Never   Smokeless tobacco: Never   Tobacco comments:    Never smoked  Vaping Use   Vaping Use: Never used  Substance Use Topics   Alcohol use: Yes    Alcohol/week: 0.0 standard drinks    Comment: socially   Drug use: No    Home Medications Prior to Admission medications   Medication Sig Start Date End Date Taking? Authorizing Provider  ALPRAZolam Prudy Feeler) 1 MG tablet Take 1 tablet (1 mg total) by mouth 2 (two) times daily as needed for anxiety. 09/23/21 09/23/22  Myrlene Broker, MD  amphetamine-dextroamphetamine (ADDERALL XR) 30 MG 24 hr capsule Take 1 capsule (30 mg total) by mouth daily. 11/02/21   Myrlene Broker, MD  calcium-vitamin D (OSCAL WITH D) 500-200 MG-UNIT tablet Take 1 tablet by mouth.    [provider]  dicyclomine (BENTYL) 10 MG capsule Take 1 capsule (10 mg total) by mouth 4 (four) times daily -  before meals and at bedtime. 06/17/19   Anice Paganini, NP  etonogestrel (NEXPLANON) 68 MG IMPL implant 1 each by Subdermal route once.    [provider]  FLUoxetine (PROZAC) 40 MG capsule TAKE ONE CAPSULE BY MOUTH EVERY DAY 09/23/21   Myrlene Broker, MD  gabapentin (NEURONTIN) 100 MG capsule Take 1 capsule (100 mg total) by mouth 3 (three) times daily. 06/17/21   Oliver Barre, MD  lisinopril-hydrochlorothiazide (ZESTORETIC) 20-12.5 MG tablet Take 1 tablet by mouth daily.     [provider]  megestrol (MEGACE) 40 MG tablet Take 3 x 5 days then 2 x 5 days then 1 daily 02/04/20   Cyril Mourning A, NP  meloxicam (MOBIC) 7.5 MG tablet TAKE 1 TABLET BY MOUTH DAILY FOR NECK PAIN AND HEADACHE. MAY INCREASE TO 2 TABLEST IF NOT EFFECTIVE 08/15/19   [provider]  omeprazole (PRILOSEC) 40 MG capsule TAKE 1 CAPSULE(40 MG) BY MOUTH  DAILY 06/09/21   Gelene Mink, NP  QUEtiapine (SEROQUEL) 50 MG tablet Take 1 tablet (50 mg total) by mouth at bedtime. 09/23/21   Myrlene Broker, MD    Allergies    Lortab [hydrocodone-acetaminophen] and Morphine and related  Review of Systems   Review of Systems  Constitutional:  Negative for fever.  Respiratory:  Negative for cough.   Gastrointestinal:  Negative for vomiting.  Skin:  Positive for wound.  Neurological:  Positive for headaches.  Psychiatric/Behavioral:  Positive for self-injury. Negative for sleep disturbance.   All other systems reviewed and are negative.  Physical Exam Updated Vital Signs BP 116/84 (BP Location: Right Arm)   Pulse 85   Temp 99 F (37.2 C) (Oral)   Resp 17   Ht 1.6 m (5\' 3" )   Wt 74.8 kg   LMP 09/15/2021 (Approximate)   SpO2 100%   BMI 29.23 kg/m   Physical Exam CONSTITUTIONAL: Well developed/well nourished HEAD: Normocephalic/atraumatic EYES: EOMI/PERRL, small abrasion over left eyelid, no proptosis ENMT: Mucous membranes moist, no signs of any facial or nasal trauma NECK: supple no meningeal signs SPINE/BACK:entire spine nontender CV: S1/S2 noted, no murmurs/rubs/gallops noted LUNGS: Lungs are clear to auscultation bilaterally, no apparent distress ABDOMEN: soft, nontender, no rebound or guarding, bowel sounds noted throughout abdomen GU:no cva tenderness NEURO: Pt is awake/alert/appropriate, moves all extremitiesx4.  No facial droop.   EXTREMITIES: pulses normal/equal, full ROM, abrasion noted to left forearm, no lacerations SKIN: warm, color normal PSYCH: no  abnormalities of mood noted, alert and oriented to situation  ED Results / Procedures / Treatments   Labs (all labs ordered are listed, but only abnormal results are displayed) Labs Reviewed  CBC WITH DIFFERENTIAL/PLATELET - Abnormal; Notable for the following components:      Result Value   WBC 13.1 (*)    Neutro Abs 8.6 (*)    All other components within normal limits  COMPREHENSIVE METABOLIC PANEL - Abnormal; Notable for the following components:   Calcium 8.7 (*)    Total Bilirubin 0.2 (*)    All other components within normal limits  ETHANOL - Abnormal; Notable for the following components:   Alcohol, Ethyl (B) 103 (*)    All other components within normal limits  RAPID URINE DRUG SCREEN, HOSP PERFORMED - Abnormal; Notable for the following components:   Benzodiazepines POSITIVE (*)    Amphetamines POSITIVE (*)    All other components within normal limits  I-STAT BETA HCG BLOOD, ED ([facility], [facility], [facility] ONLY)    EKG None  Radiology No results found.  Procedures Procedures   Medications Ordered in ED Medications  Tdap (BOOSTRIX) injection 0.5 mL (has no administration in time range)    ED Course  I have reviewed the triage vital signs and the nursing notes.  Pertinent labs  results that were available during my care of the patient were reviewed by me and considered in my medical decision making (see chart for details).    MDM Rules/Calculators/A&P                           Patient reports she tried harming her self tonight but hit herself in the head and then cutting her left forearm.  She now regrets her actions.  She denies any SI or thoughts of harm to others.  She is awake and alert and not intoxicated.  She makes good eye contact and appears appropriate She has tried to be  compliant with medications and her therapy appointments, but some of them have been canceled due to geography issues  I discussed the case at length with her husband Ivin Booty at phone number  [phone] He confirms the entire story.  He reports she has never really done anything like this before he denies that she has any recent alcohol/drug abuse.  He reports all weapons will be locked up in the home. He confirms a story that she has been unable to talk to her counselor. He feels comfortable bringing her home and he can stay with her for the next several days. She will also try to find a new counselor that is closer to her job He feels that she will be safe at home. 12:42 AM Labs reviewed.  Patient does admits to taking an alcohol drink earlier in the day.  She is on Adderall and a benzodiazepine which likely cause her drug screen finding She is awake and alert She denies SI. Called husband back, patient be discharged home Final Clinical Impression(s) / ED Diagnoses Final diagnoses:  Abrasion  Minor head injury, initial encounter  Thoughts of self harm    Rx / DC Orders ED Discharge Orders     None        Zadie Rhine, MD 11/07/21 [phone]

## 2021-11-07 DIAGNOSIS — S0990XA Unspecified injury of head, initial encounter: Secondary | ICD-10-CM | POA: Diagnosis not present

## 2021-11-07 MED ORDER — TETANUS-DIPHTH-ACELL PERTUSSIS 5-2.5-18.5 LF-MCG/0.5 IM SUSY
0.5000 mL | PREFILLED_SYRINGE | Freq: Once | INTRAMUSCULAR | Status: AC
  Administered 2021-11-07: 01:00:00 0.5 mL via INTRAMUSCULAR
  Filled 2021-11-07: qty 0.5

## 2021-11-07 NOTE — Discharge Instructions (Signed)
Substance Abuse Treatment Programs ° °Intensive Outpatient Programs °[facility]     °[address]      °[place], [place]                   °[phone]      ° °[facility] °[address] °[place], [place] °[phone] ° °[facility]     °(Inpatient and outpatient)     °[address]           °[phone]   ° °[facility] °[phone] (Suboxone and Methadone) ° °[address]      °[place], [place] [address]      °[phone]      ° °[address] °[place], [place] °[phone] ° °[facility] (Outpatient/Inpatient, Chemical)    °(insurance only) [phone]      °       °[facility] (Groups & Residential) °[place], [place] °[phone] ° °   °[facility]     °[address]     °[place], [place]      °[phone]      ° °[facility] (for caregivers and family) °[address] °[place], [place] °336-299-4655 ° ° ° ° ° °Residential Treatment Programs °[facility]      °[address], [place], [place] [address]  °[phone]      ° °[facility] °[address] [place], [place] [address] °[phone] ° °[facility]        °[phone]      ° °[facility] °[phone] ° °[facility]             °[address]                                         °[place], [place]                                                °[phone] or [phone]                              ° °[facility] [place] °[address] °[place], [address] °[phone] ° °[facility]    °[address]      °[place], [place]     °[phone]      ° °[facility] °[address] °[place], [place] °336-285-9073 ° °[facility]   °[address]     °[place], [place] [address]     °[phone]      °Admissions: 8am-3pm M-F ° °[facility] °[address] °[place] °[phone] ° °[facility]: Residential Program (90 Days)   °[place], [place]      °336-725-8389 or [phone]    ° °[facility] °[place], [place] °(Walk in Hours over the weekend or by referral) ° °[facility] °[address], [place],  [address] °([phone] ° °Crisis Mobile: [facility]:  1-877-626-1772 (for crisis response 24 hours a day) °[facility] Hotline:      1-800-256-2452 °Outpatient Psychiatry and Counseling ° °[facility]: Mobile Crisis   Management 24 hours:  1-877-626-1772 ° °[facility] sliding scale fee and walk in schedule: M-F 8am-12pm/1pm-3pm °[address]  °[place], [place] [address] °[phone] ° °[facility] °[address] °[place], [place] [address] °[phone] ° °[facility] ([facility])- new patient walk-in appointments available Monday - Friday 8am -3pm.          °[address] °[place], [place] [address] °336-676-6840 or crisis line- 336-676-6905 ° °[facility]/ Intensive Outpatient Therapy Program °[address] °[place], [place] [address] °336-832-9804 ° °[facility]                  °Crisis Services      °[phone]      °[address]     °[place], [place] [address]                ° °[facility]   °[facility] °[phone] °[address] °[place], [place] [address] ° ° °[facility]          °[address]  °[place], [place] [address]       °[phone] ° °[facility] °[address] °[place], [place] [address] °336-292-1510 ° °[facility]    °[address]    °[place], [place] [address]     °[phone]      ° °Parish McKinney, MD     °[address]     °[place] [address]     °[phone]     °  °[facility] °[address] °[place] [address] ° °[facility]     °[address]     °[place], [place]      °336-542-2076      ° °[facility] °Shamsher Ahluwalia, MD °[address] °[place], [place] [address] °[phone] ° °[facility]     °[address]     °[place], [place] [address]     °336-274-1237      ° °[facility] °[address] °[place], [place] [address] °336-854-4450 °Resources for Temporary Residential Assistance/Crisis Centers ° °DAY CENTERS °[facility] °M-F 8am-3pm   °[address] [place], [place] [address]   336-332-0824 °Services include: laundry, barbering, support groups, case management, phone  & computer access, showers, AA/NA mtgs, mental health/substance abuse nurse, job skills class, disability information, VA assistance, spiritual classes, etc.  ° °HOMELESS SHELTERS ° °[facility]     °[facility]   °[address], [place]     °[phone]       °       °[facility] (women and children)       °[address] °[place], [place] [address] °336-275-0820 °[email or web address] for application and process °Application Required ° °[facility]   °[address]    °[place] [address]     °[phone]       °             °[facility] °[address] °[place],  [address] °[phone] °336-235-0363(schedule application appt.) °Application Required ° °[facility] (women only)    °[address]     °[place],  [address]     °336-884-1039      °  Intake starts 6pm daily °Need valid ID, SSC, & Police report °[facility] [place] °[address] °[place], [place] °336-881-5420 °Application Required ° °[facility] (men only)     °[address]      °[place], [place]     °[phone]      ° °[facility] °(Pregnant women only) °[address] °[place], [place] °336-275-0206 ° °[facility]      °[address]      °[place], [place] [address]     °[phone]      °       °[facility] °[address] °[place], [place] °336-723-1848 °90 day commitment/SA/Application process ° °[facility](men only)     °[address]     °[place], [place]     °[phone]       °Check-in at 7pm     °       °[facility] °[address] °[place], [place] [address] °336-248-6684 °Men/Women/Women and Children must be there by 7 pm ° °[facility] °[place], [place] °336-722-8721                ° °

## 2021-11-08 ENCOUNTER — Telehealth (HOSPITAL_COMMUNITY): Payer: Self-pay | Admitting: Psychiatry

## 2021-11-08 NOTE — Telephone Encounter (Signed)
Called to schedule f/u appt, left detailed vm to schedule with Dr. Tenny Craw and Florencia Reasons, LCSW

## 2021-11-10 DIAGNOSIS — R000 Tachycardia, unspecified: Secondary | ICD-10-CM | POA: Insufficient documentation

## 2021-11-10 DIAGNOSIS — F319 Bipolar disorder, unspecified: Secondary | ICD-10-CM | POA: Insufficient documentation

## 2021-11-11 ENCOUNTER — Telehealth (HOSPITAL_COMMUNITY): Payer: PRIVATE HEALTH INSURANCE | Admitting: Psychiatry

## 2021-11-11 ENCOUNTER — Telehealth (HOSPITAL_COMMUNITY): Payer: Self-pay | Admitting: Psychiatry

## 2021-11-11 ENCOUNTER — Other Ambulatory Visit: Payer: Self-pay

## 2021-11-11 ENCOUNTER — Telehealth (HOSPITAL_COMMUNITY): Payer: Self-pay | Admitting: *Deleted

## 2021-11-11 NOTE — Telephone Encounter (Signed)
Call patient to schedule f/u appt with Dr. Tenny Craw, left detailed vm requesting a call back to schedule

## 2021-11-11 NOTE — Telephone Encounter (Signed)
Patient had hospital f/u with provider provider today and was not able to the appt due to patient not picking up her calls or connecting to video visit. Pt did not come into office either for visit.   Provider requested staff call patient to see if she's okay and have pt resch another follow up.    Staff tried calling patient mobile number and was not able to reach her and [facility] and office number was provided 3:33 pm 11/11/2021. Staff tried calling patient work number with direct personal line and was not able to reach her either.

## 2021-11-12 ENCOUNTER — Telehealth (INDEPENDENT_AMBULATORY_CARE_PROVIDER_SITE_OTHER): Payer: PRIVATE HEALTH INSURANCE | Admitting: Psychiatry

## 2021-11-12 ENCOUNTER — Encounter (HOSPITAL_COMMUNITY): Payer: Self-pay | Admitting: Psychiatry

## 2021-11-12 ENCOUNTER — Other Ambulatory Visit: Payer: Self-pay

## 2021-11-12 DIAGNOSIS — F900 Attention-deficit hyperactivity disorder, predominantly inattentive type: Secondary | ICD-10-CM

## 2021-11-12 DIAGNOSIS — F4310 Post-traumatic stress disorder, unspecified: Secondary | ICD-10-CM

## 2021-11-12 DIAGNOSIS — F331 Major depressive disorder, recurrent, moderate: Secondary | ICD-10-CM

## 2021-11-12 MED ORDER — AMPHETAMINE-DEXTROAMPHET ER 30 MG PO CP24: 30.0000 mg | ORAL_CAPSULE | Freq: Every day | ORAL | 0 refills | Status: DC

## 2021-11-12 MED ORDER — FLUOXETINE HCL 40 MG PO CAPS: ORAL_CAPSULE | ORAL | 2 refills | Status: DC

## 2021-11-12 MED ORDER — ALPRAZOLAM 1 MG PO TABS: 1.0000 mg | ORAL_TABLET | Freq: Two times a day (BID) | ORAL | 2 refills | Status: DC | PRN

## 2021-11-12 MED ORDER — QUETIAPINE FUMARATE 100 MG PO TABS: 100.0000 mg | ORAL_TABLET | Freq: Two times a day (BID) | ORAL | 2 refills | Status: DC
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
## 2021-11-16 ENCOUNTER — Ambulatory Visit: Payer: PRIVATE HEALTH INSURANCE | Admitting: Orthopedic Surgery

## 2021-11-16 ENCOUNTER — Encounter: Payer: Self-pay | Admitting: Orthopedic Surgery

## 2021-11-21 ENCOUNTER — Other Ambulatory Visit (HOSPITAL_COMMUNITY): Payer: Self-pay | Admitting: Psychiatry

## 2021-12-02 ENCOUNTER — Telehealth (HOSPITAL_COMMUNITY): Payer: Self-pay | Admitting: Psychiatry

## 2021-12-02 NOTE — Telephone Encounter (Signed)
Returned call to patient to schedule appt. Pt has been sick with COVID this is the reason for any delay in making her appt

## 2021-12-10 ENCOUNTER — Other Ambulatory Visit: Payer: Self-pay

## 2021-12-10 ENCOUNTER — Telehealth (INDEPENDENT_AMBULATORY_CARE_PROVIDER_SITE_OTHER): Payer: PRIVATE HEALTH INSURANCE | Admitting: Psychiatry

## 2021-12-10 ENCOUNTER — Encounter (HOSPITAL_COMMUNITY): Payer: Self-pay | Admitting: Psychiatry

## 2021-12-10 DIAGNOSIS — F4310 Post-traumatic stress disorder, unspecified: Secondary | ICD-10-CM

## 2021-12-10 DIAGNOSIS — F900 Attention-deficit hyperactivity disorder, predominantly inattentive type: Secondary | ICD-10-CM

## 2021-12-10 DIAGNOSIS — F331 Major depressive disorder, recurrent, moderate: Secondary | ICD-10-CM

## 2021-12-10 MED ORDER — AMPHETAMINE-DEXTROAMPHET ER 30 MG PO CP24: 30.0000 mg | ORAL_CAPSULE | ORAL | 0 refills | Status: DC

## 2021-12-10 MED ORDER — QUETIAPINE FUMARATE 100 MG PO TABS: 100.0000 mg | ORAL_TABLET | Freq: Two times a day (BID) | ORAL | 2 refills | Status: DC

## 2021-12-10 MED ORDER — ALPRAZOLAM 1 MG PO TABS: 1.0000 mg | ORAL_TABLET | Freq: Two times a day (BID) | ORAL | 2 refills | Status: DC | PRN

## 2021-12-10 MED ORDER — FLUOXETINE HCL 40 MG PO CAPS
ORAL_CAPSULE | ORAL | 2 refills | Status: DC
Start: 2021-12-10 — End: 2022-02-14

## 2021-12-10 MED ORDER — AMPHETAMINE-DEXTROAMPHET ER 30 MG PO CP24: 30.0000 mg | ORAL_CAPSULE | Freq: Every day | ORAL | 0 refills | Status: DC

## 2021-12-10 NOTE — Progress Notes (Signed)
Virtual Visit via Video Note  I connected with Sandra Glover on 12/10/21 at 11:40 AM EST by a video enabled telemedicine application and verified that I am speaking with the correct person using two identifiers.  Location: Patient: home Provider: home office   I discussed the limitations of evaluation and management by telemedicine and the availability of in person appointments. The patient expressed understanding and agreed to proceed.    I discussed the assessment and treatment plan with the patient. The patient was provided an opportunity to ask questions and all were answered. The patient agreed with the plan and demonstrated an understanding of the instructions.   The patient was advised to call back or seek an in-person evaluation if the symptoms worsen or if the condition fails to improve as anticipated.  I provided 14 minutes of non-face-to-face time during this encounter.   Levonne Spiller, MD  [facility] MD/PA/NP OP Progress Note  12/10/2021 12:13 PM Sandra Glover  MRN:  [id number]  Chief Complaint:  Chief Complaint   Anxiety; Depression; Follow-up    HPI: This patient is a 36 year old married white female who lives with her husband and two children in [place]. 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  The patient returns for follow-up after 4 weeks.  She states that she and her whole family had [place] recently.  She spent 1 day in the hospital at [facility] because she developed pneumonia.  In terms of mood she seems to be doing much better.  She is recovered from [place] and has gone back to work.  Her energy is starting to come back.  She denies significant depression thoughts of self-harm or suicidal ideation.  She is sleeping well and her energy is good. Visit Diagnosis:    ICD-10-CM   1. Major depressive disorder, recurrent episode, moderate (HCC)  F33.1     2. Attention deficit hyperactivity disorder (ADHD), predominantly inattentive type  F90.0      3. PTSD (post-traumatic stress disorder)  F43.10       Past Psychiatric History: Long-term outpatient treatment  Past Medical History:  Past Medical History:  Diagnosis Date   Anxiety    Anxiety    Depression    History of anemia 2013   History of depression    states stopped med. in 2013   History of partial nephrectomy age 59 mos.   right - due to kidney infection   Hypertension    Osteopenia    Pertussis    Postcoital bleeding 07/14/2015   Sinus infection 01/25/2013   started antibiotic 01/24/2013 x 10 days; nasal congestion, clear drainage from nose   Tonsillar and adenoid hypertrophy 01/2013   Vaginal irritation 03/25/2014   Mild yeast but has history of yeast, will not treat til after first trimester,try yogurt    Past Surgical History:  Procedure Laterality Date   CHOLECYSTECTOMY  04/23/2010   laparoscopic   COLONOSCOPY WITH PROPOFOL N/A 05/17/2016   Procedure: COLONOSCOPY WITH PROPOFOL;  Surgeon: Danie Binder, MD;  [facility];  Service: Endoscopy;  Laterality: N/A;  1000   HEMORRHOID BANDING N/A 05/17/2016   Procedure: HEMORRHOID BANDING;  Surgeon: Danie Binder, MD;  [facility];  Service: Endoscopy;  Laterality: N/A;   HEMORRHOID SURGERY N/A 06/08/2016   Procedure: SIMPLE HEMORRHOIDECTOMY;  Surgeon: Vickie Epley, MD;  Location: [facility];  Service: General;  Laterality: N/A;   PARTIAL NEPHRECTOMY Right age 21 mos.   TONSILLECTOMY  AND ADENOIDECTOMY N/A 01/29/2013   Procedure: TONSILLECTOMY AND ADENOIDECTOMY;  Surgeon: Ascencion Dike, MD;  Location: [facility];  Service: ENT;  Laterality: N/A;    Family Psychiatric History: see below  Family History:  Family History  Problem Relation Age of Onset   COPD Mother    Depression Mother    Anxiety disorder Mother    Hypertension Mother    Irritable bowel syndrome Mother    Arthritis Mother    Skin cancer Mother    Thyroid cancer Mother    Ovarian cancer Maternal Aunt     Ovarian cancer Maternal Grandmother    Cancer Maternal Grandmother        bladder, kidney   Hypertension Father    GER disease Father    Bipolar disorder Paternal Uncle    Anxiety disorder Cousin    Drug abuse Cousin    Bipolar disorder Cousin    Colon cancer Neg Hx     Social History:  Social History   Socioeconomic History   Marital status: Married    Spouse name: Not on file   Number of children: Not on file   Years of education: Not on file   Highest education level: Not on file  Occupational History   Not on file  Tobacco Use   Smoking status: Never   Smokeless tobacco: Never   Tobacco comments:    Never smoked  Vaping Use   Vaping Use: Never used  Substance and Sexual Activity   Alcohol use: Yes    Alcohol/week: 0.0 standard drinks    Comment: socially   Drug use: No   Sexual activity: Yes    Birth control/protection: Implant    Comment: nexplanon implant  Other Topics Concern   Not on file  Social History Narrative   Not on file   Social Determinants of Health   Financial Resource Strain: Not on file  Food Insecurity: Not on file  Transportation Needs: Not on file  Physical Activity: Not on file  Stress: Not on file  Social Connections: Not on file    Allergies:  Allergies  Allergen Reactions   Lortab [Hydrocodone-Acetaminophen] Nausea And Vomiting   Morphine And Related Rash    Metabolic Disorder Labs: No results found for: HGBA1C, MPG No results found for: PROLACTIN No results found for: CHOL, TRIG, HDL, CHOLHDL, VLDL, LDLCALC Lab Results  Component Value Date   TSH 2.553 10/13/2017    Therapeutic Level Labs: No results found for: LITHIUM No results found for: VALPROATE No components found for:  CBMZ  Current Medications: Current Outpatient Medications  Medication Sig Dispense Refill   amphetamine-dextroamphetamine (ADDERALL XR) 30 MG 24 hr capsule Take 1 capsule (30 mg total) by mouth every morning. 30 capsule 0   ALPRAZolam  (XANAX) 1 MG tablet Take 1 tablet (1 mg total) by mouth 2 (two) times daily as needed for anxiety. 60 tablet 2   amphetamine-dextroamphetamine (ADDERALL XR) 30 MG 24 hr capsule Take 1 capsule (30 mg total) by mouth daily. 30 capsule 0   calcium-vitamin D (OSCAL WITH D) 500-200 MG-UNIT tablet Take 1 tablet by mouth.     dicyclomine (BENTYL) 10 MG capsule Take 1 capsule (10 mg total) by mouth 4 (four) times daily -  before meals and at bedtime. 90 capsule 0   etonogestrel (NEXPLANON) 68 MG IMPL implant 1 each by Subdermal route once.     FLUoxetine (PROZAC) 40 MG capsule TAKE ONE CAPSULE BY MOUTH EVERY DAY 30  capsule 2   gabapentin (NEURONTIN) 100 MG capsule Take 1 capsule (100 mg total) by mouth 3 (three) times daily. 90 capsule 0   lisinopril-hydrochlorothiazide (ZESTORETIC) 20-12.5 MG tablet Take 1 tablet by mouth daily.     megestrol (MEGACE) 40 MG tablet Take 3 x 5 days then 2 x 5 days then 1 daily 45 tablet 1   meloxicam (MOBIC) 7.5 MG tablet TAKE 1 TABLET BY MOUTH DAILY FOR NECK PAIN AND HEADACHE. MAY INCREASE TO 2 TABLEST IF NOT EFFECTIVE     omeprazole (PRILOSEC) 40 MG capsule TAKE 1 CAPSULE(40 MG) BY MOUTH DAILY 30 capsule 3   QUEtiapine (SEROQUEL) 100 MG tablet Take 1 tablet (100 mg total) by mouth 2 (two) times daily. 60 tablet 2   No current facility-administered medications for this visit.     Musculoskeletal: Strength & Muscle Tone: within normal limits Gait & Station: normal Patient leans: N/A  Psychiatric Specialty Exam: Review of Systems  All other systems reviewed and are negative.  There were no vitals taken for this visit.There is no height or weight on file to calculate BMI.  General Appearance: Casual, Neat, and Well Groomed  Eye Contact:  Good  Speech:  Clear and Coherent  Volume:  Normal  Mood:  Euthymic  Affect:  Appropriate and Congruent  Thought Process:  Goal Directed  Orientation:  Full (Time, Place, and Person)  Thought Content: WDL   Suicidal Thoughts:   No  Homicidal Thoughts:  No  Memory:  Immediate;   Good Recent;   Good Remote;   Good  Judgement:  Good  Insight:  Good  Psychomotor Activity:  Normal  Concentration:  Concentration: Good and Attention Span: Good  Recall:  Good  Fund of Knowledge: Good  Language: Good  Akathisia:  No  Handed:  Right  AIMS (if indicated): not done  Assets:  Communication Skills Desire for Improvement Physical Health Resilience Social Support Talents/Skills  ADL's:  Intact  Cognition: WNL  Sleep:  Good   Screenings: GAD-7    Flowsheet Row Counselor from 09/27/2018 in [facility] [facility]  Total GAD-7 Score 18      PHQ2-9    Flowsheet Row Video Visit from 12/10/2021 in [facility] Video Visit from 11/12/2021 in [place] [facility] Video Visit from 09/23/2021 in [place] [facility] Video Visit from 08/13/2021 in [place] [facility] Video Visit from 07/14/2021 in [place] [facility]  PHQ-2 Total Score 0 2 1 2 2   PHQ-9 Total Score -- 8 4 7 4       Flowsheet Row Video Visit from 12/10/2021 in [facility]- Video Visit from 11/12/2021 in [facility] from 11/06/2021 in [place] Error: Q3, 4, or 5 should not be populated when Q2 is No Error: Q3, 4, or 5 should not be populated when Q2 is No No Risk        Assessment and Plan: This patient is a 36 year old female with a history of depression anxiety ADD and possible bipolar 2 disorder.  She is doing well on her current regimen.  She will continue Seroquel 100 mg at bedtime for mood stabilization, Adderall XR 30 mg every morning for ADHD, Prozac 40 mg for depression and Xanax 1 mg twice daily for anxiety.  She will  return to see me in 2 months   Levonne Spiller, MD 12/10/2021, 12:13 PM

## 2021-12-14 ENCOUNTER — Encounter: Payer: Self-pay | Admitting: *Deleted

## 2021-12-14 ENCOUNTER — Telehealth: Payer: Self-pay | Admitting: *Deleted

## 2021-12-14 ENCOUNTER — Other Ambulatory Visit (HOSPITAL_COMMUNITY): Payer: Self-pay | Admitting: Psychiatry

## 2021-12-14 ENCOUNTER — Telehealth (HOSPITAL_COMMUNITY): Payer: Self-pay | Admitting: *Deleted

## 2021-12-14 MED ORDER — AMPHETAMINE-DEXTROAMPHET ER 30 MG PO CP24: 30.0000 mg | ORAL_CAPSULE | Freq: Every day | ORAL | 0 refills | Status: DC

## 2021-12-14 NOTE — Telephone Encounter (Signed)
Informed patient and she verbalized understanding.  

## 2021-12-14 NOTE — Telephone Encounter (Signed)
Patient called stating that [facility] is still out of stock for her Adderall   Patient wants provider to please send script to [facility] on [address]

## 2021-12-14 NOTE — Telephone Encounter (Signed)
Attempted to call patient back regarding her earlier phone call to office requesting to speak to a nurse. No answer. Left vm to call back. Also sent mychart message.

## 2021-12-14 NOTE — Telephone Encounter (Signed)
sent 

## 2021-12-17 ENCOUNTER — Encounter: Payer: Self-pay | Admitting: Obstetrics & Gynecology

## 2021-12-17 ENCOUNTER — Other Ambulatory Visit: Payer: Self-pay

## 2021-12-17 ENCOUNTER — Ambulatory Visit (INDEPENDENT_AMBULATORY_CARE_PROVIDER_SITE_OTHER): Payer: BLUE CROSS/BLUE SHIELD | Admitting: Obstetrics & Gynecology

## 2021-12-17 VITALS — BP 121/77 | HR 106 | Ht 63.0 in | Wt 179.0 lb

## 2021-12-17 DIAGNOSIS — N816 Rectocele: Secondary | ICD-10-CM | POA: Diagnosis not present

## 2021-12-17 DIAGNOSIS — N812 Incomplete uterovaginal prolapse: Secondary | ICD-10-CM

## 2021-12-17 DIAGNOSIS — K5902 Outlet dysfunction constipation: Secondary | ICD-10-CM

## 2021-12-17 MED ORDER — POLYETHYLENE GLYCOL 3350 17 GM/SCOOP PO POWD: ORAL | 11 refills | Status: AC

## 2021-12-17 MED ORDER — DROSPIRENONE-ESTETROL 3-14.2 MG PO TABS: 1.0000 | ORAL_TABLET | Freq: Every day | ORAL | 12 refills | Status: DC

## 2021-12-17 NOTE — Progress Notes (Signed)
Chief Complaint  Patient presents with   Follow-up    On cystocele/rectocele      36 y.o. [id number] No LMP recorded. Patient has had an implant. The current method of family planning is nexplanon .  Outpatient Encounter Medications as of 12/17/2021  Medication Sig   ALPRAZolam (XANAX) 1 MG tablet Take 1 tablet (1 mg total) by mouth 2 (two) times daily as needed for anxiety.   amphetamine-dextroamphetamine (ADDERALL XR) 30 MG 24 hr capsule Take 1 capsule (30 mg total) by mouth every morning.   calcium-vitamin D (OSCAL WITH D) 500-200 MG-UNIT tablet Take 1 tablet by mouth.   dicyclomine (BENTYL) 10 MG capsule Take 1 capsule (10 mg total) by mouth 4 (four) times daily -  before meals and at bedtime.   Drospirenone-Estetrol 3-14.2 MG TABS Take 1 tablet by mouth daily.   etonogestrel (NEXPLANON) 68 MG IMPL implant 1 each by Subdermal route once.   FLUoxetine (PROZAC) 40 MG capsule TAKE ONE CAPSULE BY MOUTH EVERY DAY   gabapentin (NEURONTIN) 100 MG capsule Take 1 capsule (100 mg total) by mouth 3 (three) times daily.   lisinopril-hydrochlorothiazide (ZESTORETIC) 20-12.5 MG tablet Take 1 tablet by mouth daily.   megestrol (MEGACE) 40 MG tablet Take 3 x 5 days then 2 x 5 days then 1 daily   meloxicam (MOBIC) 7.5 MG tablet TAKE 1 TABLET BY MOUTH DAILY FOR NECK PAIN AND HEADACHE. MAY INCREASE TO 2 TABLEST IF NOT EFFECTIVE   omeprazole (PRILOSEC) 40 MG capsule TAKE 1 CAPSULE(40 MG) BY MOUTH DAILY   polyethylene glycol powder (GLYCOLAX/MIRALAX) 17 GM/SCOOP powder 1-4 scoop daily or as needed   QUEtiapine (SEROQUEL) 100 MG tablet Take 1 tablet (100 mg total) by mouth 2 (two) times daily.   amphetamine-dextroamphetamine (ADDERALL XR) 30 MG 24 hr capsule Take 1 capsule (30 mg total) by mouth daily.   No facility-administered encounter medications on file as of 12/17/2021.    Subjective Known Grade 2 uterine and rectal prolapse Here for interval evaluation Referred for pelvic PT but has not  managed to get that done, referral ws made Want to delay surgery due to age, not necessarily preserving reproductive ability but her lifetime of pelvic surgical needs  Past Medical History:  Diagnosis Date   Anxiety    Anxiety    Depression    History of anemia 2013   History of depression    states stopped med. 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   Hypertension    Osteopenia    Pertussis    Postcoital bleeding 07/14/2015   Sinus infection 01/25/2013   started antibiotic 01/24/2013 x 10 days; nasal congestion, clear drainage from nose   Tonsillar and adenoid hypertrophy 01/2013   Vaginal irritation 03/25/2014   Mild yeast but has history of yeast, will not treat til after first trimester,try yogurt    Past Surgical History:  Procedure Laterality Date   CHOLECYSTECTOMY  04/23/2010   laparoscopic   COLONOSCOPY WITH PROPOFOL N/A 05/17/2016   Procedure: COLONOSCOPY WITH PROPOFOL;  Surgeon: Danie Binder, MD;  Location: [facility];  Service: Endoscopy;  Laterality: N/A;  1000   HEMORRHOID BANDING N/A 05/17/2016   Procedure: HEMORRHOID BANDING;  Surgeon: Danie Binder, MD;  Location: [facility];  Service: Endoscopy;  Laterality: N/A;   HEMORRHOID SURGERY N/A 06/08/2016   Procedure: SIMPLE HEMORRHOIDECTOMY;  Surgeon: Vickie Epley, MD;  Location:  [facility];  Service: General;  Laterality: N/A;   PARTIAL NEPHRECTOMY Right age 59 mos.   TONSILLECTOMY AND ADENOIDECTOMY N/A 01/29/2013   Procedure: TONSILLECTOMY AND ADENOIDECTOMY;  Surgeon: Ascencion Dike, MD;  Location: [facility];  Service: ENT;  Laterality: N/A;    OB History     Gravida  2   Para  2   Term  1   Preterm  1   AB      Living  2      SAB      IAB      Ectopic      Multiple      Live Births  2           Allergies  Allergen Reactions   Lortab [Hydrocodone-Acetaminophen] Nausea And Vomiting   Morphine And Related Rash    Social  History   Socioeconomic History   Marital status: Married    Spouse name: Not on file   Number of children: Not on file   Years of education: Not on file   Highest education level: Not on file  Occupational History   Not on file  Tobacco Use   Smoking status: Never   Smokeless tobacco: Never   Tobacco comments:    Never smoked  Vaping Use   Vaping Use: Never used  Substance and Sexual Activity   Alcohol use: Yes    Alcohol/week: 0.0 standard drinks    Comment: socially   Drug use: No   Sexual activity: Yes    Birth control/protection: Implant    Comment: nexplanon implant  Other Topics Concern   Not on file  Social History Narrative   Not on file   Social Determinants of Health   Financial Resource Strain: Not on file  Food Insecurity: Not on file  Transportation Needs: Not on file  Physical Activity: Not on file  Stress: Not on file  Social Connections: Not on file    Family History  Problem Relation Age of Onset   COPD Mother    Depression Mother    Anxiety disorder Mother    Hypertension Mother    Irritable bowel syndrome Mother    Arthritis Mother    Skin cancer Mother    Thyroid cancer Mother    Ovarian cancer Maternal Aunt    Ovarian cancer Maternal Grandmother    Cancer Maternal Grandmother        bladder, kidney   Hypertension Father    GER disease Father    Bipolar disorder Paternal Uncle    Anxiety disorder Cousin    Drug abuse Cousin    Bipolar disorder Cousin    Colon cancer Neg Hx     Medications:       Current Outpatient Medications:    ALPRAZolam (XANAX) 1 MG tablet, Take 1 tablet (1 mg total) by mouth 2 (two) times daily as needed for anxiety., Disp: 60 tablet, Rfl: 2   amphetamine-dextroamphetamine (ADDERALL XR) 30 MG 24 hr capsule, Take 1 capsule (30 mg total) by mouth every morning., Disp: 30 capsule, Rfl: 0   calcium-vitamin D (OSCAL WITH D) 500-200 MG-UNIT tablet, Take 1 tablet by mouth., Disp: , Rfl:    dicyclomine (BENTYL) 10  MG capsule, Take 1 capsule (10 mg total) by mouth 4 (four) times daily -  before meals and at bedtime., Disp: 90 capsule, Rfl: 0   Drospirenone-Estetrol 3-14.2 MG TABS, Take 1 tablet by mouth daily., Disp: 28 tablet, Rfl: 12  etonogestrel (NEXPLANON) 68 MG IMPL implant, 1 each by Subdermal route once., Disp: , Rfl:    FLUoxetine (PROZAC) 40 MG capsule, TAKE ONE CAPSULE BY MOUTH EVERY DAY, Disp: 30 capsule, Rfl: 2   gabapentin (NEURONTIN) 100 MG capsule, Take 1 capsule (100 mg total) by mouth 3 (three) times daily., Disp: 90 capsule, Rfl: 0   lisinopril-hydrochlorothiazide (ZESTORETIC) 20-12.5 MG tablet, Take 1 tablet by mouth daily., Disp: , Rfl:    megestrol (MEGACE) 40 MG tablet, Take 3 x 5 days then 2 x 5 days then 1 daily, Disp: 45 tablet, Rfl: 1   meloxicam (MOBIC) 7.5 MG tablet, TAKE 1 TABLET BY MOUTH DAILY FOR NECK PAIN AND HEADACHE. MAY INCREASE TO 2 TABLEST IF NOT EFFECTIVE, Disp: , Rfl:    omeprazole (PRILOSEC) 40 MG capsule, TAKE 1 CAPSULE(40 MG) BY MOUTH DAILY, Disp: 30 capsule, Rfl: 3   polyethylene glycol powder (GLYCOLAX/MIRALAX) 17 GM/SCOOP powder, 1-4 scoop daily or as needed, Disp: 255 g, Rfl: 11   QUEtiapine (SEROQUEL) 100 MG tablet, Take 1 tablet (100 mg total) by mouth 2 (two) times daily., Disp: 60 tablet, Rfl: 2   amphetamine-dextroamphetamine (ADDERALL XR) 30 MG 24 hr capsule, Take 1 capsule (30 mg total) by mouth daily., Disp: 30 capsule, Rfl: 0  Objective Blood pressure 121/77, pulse (!) 106, height 5\' 3"  (1.6 m), weight 179 lb (81.2 kg).  General [facility]N female NAD Vulva:  normal appearing vulva with no masses, tenderness or lesions Grade 2 cytocoel, uterine prolapse and rectocoele Vagina:  normal mucosa, no discharge Cervix:  Normal no lesions Uterus:  normal size, contour, position, consistency, mobility, non-tender Adnexa: ovaries:present,  normal adnexa in size, nontender and no masses  Pertinent ROS No burning with urination, frequency or urgency No nausea,  vomiting or diarrhea Nor fever chills or other constitutional symptoms   Labs or studies     Impression Diagnoses this Encounter::   ICD-10-CM   1. Cystocele with second degree uterine prolapse  N81.2     2. Pelvic organ prolapse quantification stage 2 rectocele  N81.6     3. Constipation due to outlet dysfunction  K59.02         Plan/Recommendations: Meds ordered this encounter  Medications   polyethylene glycol powder (GLYCOLAX/MIRALAX) 17 GM/SCOOP powder    Sig: 1-4 scoop daily or as needed    Dispense:  255 g    Refill:  11   Drospirenone-Estetrol 3-14.2 MG TABS    Sig: Take 1 tablet by mouth daily.    Dispense:  28 tablet    Refill:  12    Labs or Scans Ordered: No orders of the defined types were placed in this encounter.    Follow up No follow-ups on file.       All questions were answered.

## 2021-12-22 ENCOUNTER — Other Ambulatory Visit: Payer: Self-pay | Admitting: *Deleted

## 2021-12-22 MED ORDER — DROSPIRENONE-ESTETROL 3-14.2 MG PO TABS: 1.0000 | ORAL_TABLET | Freq: Every day | ORAL | 12 refills | Status: AC

## 2021-12-28 ENCOUNTER — Encounter: Payer: Self-pay | Admitting: *Deleted

## 2022-01-16 ENCOUNTER — Other Ambulatory Visit (HOSPITAL_COMMUNITY): Payer: Self-pay | Admitting: Psychiatry

## 2022-01-19 ENCOUNTER — Other Ambulatory Visit (HOSPITAL_COMMUNITY): Payer: Self-pay | Admitting: Psychiatry

## 2022-01-19 ENCOUNTER — Telehealth (HOSPITAL_COMMUNITY): Payer: Self-pay | Admitting: Psychiatry

## 2022-01-19 DIAGNOSIS — F331 Major depressive disorder, recurrent, moderate: Secondary | ICD-10-CM

## 2022-01-19 NOTE — Telephone Encounter (Signed)
Called to schedule f/u appt left vm 

## 2022-02-14 ENCOUNTER — Other Ambulatory Visit (HOSPITAL_COMMUNITY): Payer: Self-pay | Admitting: Psychiatry

## 2022-03-18 ENCOUNTER — Encounter (HOSPITAL_COMMUNITY): Payer: Self-pay | Admitting: *Deleted

## 2022-03-18 ENCOUNTER — Emergency Department (HOSPITAL_COMMUNITY): Payer: BLUE CROSS/BLUE SHIELD

## 2022-03-18 ENCOUNTER — Emergency Department (HOSPITAL_COMMUNITY)
Admission: EM | Admit: 2022-03-18 | Discharge: 2022-03-18 | Disposition: A | Discharge status: Home / Self Care | Payer: BLUE CROSS/BLUE SHIELD | Attending: Emergency Medicine | Admitting: Emergency Medicine

## 2022-03-18 DIAGNOSIS — Z79899 Other long term (current) drug therapy: Secondary | ICD-10-CM | POA: Diagnosis not present

## 2022-03-18 DIAGNOSIS — S0990XA Unspecified injury of head, initial encounter: Secondary | ICD-10-CM | POA: Diagnosis present

## 2022-03-18 DIAGNOSIS — X58XXXA Exposure to other specified factors, initial encounter: Secondary | ICD-10-CM | POA: Insufficient documentation

## 2022-03-18 DIAGNOSIS — R200 Anesthesia of skin: Secondary | ICD-10-CM | POA: Insufficient documentation

## 2022-03-18 DIAGNOSIS — S060X0A Concussion without loss of consciousness, initial encounter: Secondary | ICD-10-CM

## 2022-03-18 DIAGNOSIS — D72829 Elevated white blood cell count, unspecified: Secondary | ICD-10-CM | POA: Insufficient documentation

## 2022-03-18 LAB — CBC WITH DIFFERENTIAL/PLATELET
Abs Immature Granulocytes: 0.05 10*3/uL (ref 0.00–0.07)
Basophils Absolute: 0.1 10*3/uL (ref 0.0–0.1)
Basophils Relative: 1 %
Eosinophils Absolute: 0.3 10*3/uL (ref 0.0–0.5)
Eosinophils Relative: 3 %
HCT: 38.8 % (ref 36.0–46.0)
Hemoglobin: 13.4 g/dL (ref 12.0–15.0)
Immature Granulocytes: 1 %
Lymphocytes Relative: 23 %
Lymphs Abs: 2.5 10*3/uL (ref 0.7–4.0)
MCH: 33.9 pg (ref 26.0–34.0)
MCHC: 34.5 g/dL (ref 30.0–36.0)
MCV: 98.2 fL (ref 80.0–100.0)
Monocytes Absolute: 0.7 10*3/uL (ref 0.1–1.0)
Monocytes Relative: 6 %
Neutro Abs: 7.4 10*3/uL (ref 1.7–7.7)
Neutrophils Relative %: 66 %
Platelets: 260 10*3/uL (ref 150–400)
RBC: 3.95 MIL/uL (ref 3.87–5.11)
RDW: 12.9 % (ref 11.5–15.5)
WBC: 10.9 10*3/uL — ABNORMAL HIGH (ref 4.0–10.5)
nRBC: 0 % (ref 0.0–0.2)

## 2022-03-18 LAB — BASIC METABOLIC PANEL
Anion gap: 8 (ref 5–15)
BUN: 15 mg/dL (ref 6–20)
CO2: 25 mmol/L (ref 22–32)
Calcium: 8.9 mg/dL (ref 8.9–10.3)
Chloride: 103 mmol/L (ref 98–111)
Creatinine, Ser: 0.98 mg/dL (ref 0.44–1.00)
GFR, Estimated: >60 mL/min (ref >60)
Glucose, Bld: 105 mg/dL — ABNORMAL HIGH (ref 70–99)
Potassium: 3.6 mmol/L (ref 3.5–5.1)
Sodium: 136 mmol/L (ref 135–145)

## 2022-03-18 LAB — URINALYSIS, ROUTINE W REFLEX MICROSCOPIC
Bilirubin Urine: NEGATIVE
Glucose, UA: NEGATIVE mg/dL
Ketones, ur: NEGATIVE mg/dL
Nitrite: NEGATIVE
Protein, ur: NEGATIVE mg/dL
Specific Gravity, Urine: 1.011 (ref 1.005–1.030)
pH: 6 (ref 5.0–8.0)

## 2022-03-18 LAB — I-STAT BETA HCG BLOOD, ED (MC, WL, AP ONLY): I-stat hCG, quantitative: <5 m[IU]/mL (ref <5)

## 2022-03-18 NOTE — ED Provider Notes (Signed)
?[facility] ?Provider Note ? ? ?CSN: [id number] ?Arrival date & time: 03/18/22  1453 ? ?  ? ?History ?Chief Complaint  ?Patient presents with  ? Headache  ? ? ?Sandra Glover is a 36 y.o. female with history of depression and anxiety who presents the emergency department with difficulty finding words and confusion since she lost consciousness and hit her head roughly 2 weeks ago.  He has been worsening over the last couple of days.  Patient also reporting associated headache and numbness down the left arm.  Patient does also have a history of carpal tunnel she says feels somewhat similar.  No focal weakness or numbness to the legs.  No nausea, vomiting, diarrhea. ? ? ?Headache ? ?  ? ?Home Medications ?Prior to Admission medications   ?Medication Sig Start Date End Date Taking? Authorizing Provider  ?ALPRAZolam (XANAX) 1 MG tablet Take 1 tablet (1 mg total) by mouth 2 (two) times daily as needed for anxiety. 12/10/21 12/10/22 Yes Myrlene Broker, MD  ?amphetamine-dextroamphetamine (ADDERALL XR) 30 MG 24 hr capsule Take 1 capsule (30 mg total) by mouth every morning. 12/10/21  Yes Myrlene Broker, MD  ?calcium-vitamin D (OSCAL WITH D) 500-200 MG-UNIT tablet Take 1 tablet by mouth.   Yes [provider]  ?dicyclomine (BENTYL) 10 MG capsule Take 1 capsule (10 mg total) by mouth 4 (four) times daily -  before meals and at bedtime. 06/17/19  Yes Anice Paganini, NP  ?Drospirenone-Estetrol 3-14.2 MG TABS Take 1 tablet by mouth daily. 12/22/21  Yes Lazaro Arms, MD  ?etonogestrel (NEXPLANON) 68 MG IMPL implant 1 each by Subdermal route once.   Yes [provider]  ?FLUoxetine (PROZAC) 40 MG capsule TAKE 1 CAPSULE BY MOUTH EVERY DAY 02/14/22  Yes Myrlene Broker, MD  ?lisinopril-hydrochlorothiazide (ZESTORETIC) 20-12.5 MG tablet Take 1 tablet by mouth daily.   Yes [provider]  ?meloxicam (MOBIC) 7.5 MG tablet Take 7.5 mg by mouth daily as needed for pain. 08/15/19  Yes [provider]  ?omeprazole (PRILOSEC) 40 MG capsule TAKE 1 CAPSULE(40 MG) BY MOUTH DAILY ?Patient taking differently: Take 40 mg by mouth daily. TAKE 1 CAPSULE(40 MG) BY MOUTH DAILY 06/09/21  Yes Gelene Mink, NP  ?polyethylene glycol powder (GLYCOLAX/MIRALAX) 17 GM/SCOOP powder 1-4 scoop daily or as needed 12/17/21  Yes Lazaro Arms, MD  ?QUEtiapine (SEROQUEL) 100 MG tablet TAKE 1 TABLET BY MOUTH TWICE DAILY ?Patient taking differently: Take 100 mg by mouth at bedtime. 01/17/22  Yes Myrlene Broker, MD  ?amphetamine-dextroamphetamine (ADDERALL XR) 30 MG 24 hr capsule Take 1 capsule (30 mg total) by mouth daily. ?Patient not taking: Reported on 03/18/2022 12/14/21   Myrlene Broker, MD  ?gabapentin (NEURONTIN) 100 MG capsule Take 1 capsule (100 mg total) by mouth 3 (three) times daily. ?Patient not taking: Reported on 03/18/2022 06/17/21   Oliver Barre, MD  ?megestrol (MEGACE) 40 MG tablet Take 3 x 5 days then 2 x 5 days then 1 daily ?Patient not taking: Reported on 03/18/2022 02/04/20   Adline Potter, NP  ?   ? ?Allergies    ?Lortab [hydrocodone-acetaminophen] and Morphine and related   ? ?Review of Systems   ?Review of Systems  ?Neurological:  Positive for headaches.  ?All other systems reviewed and are negative. ? ?Physical Exam ?Updated Vital Signs ?BP 107/67   Pulse 60   Temp 98.5 ?F (36.9 ?C) (Oral)   Resp 15   Ht  5\' 3"  (1.6 m)   Wt 74.8 kg   SpO2 100%   BMI 29.23 kg/m?  ?Physical Exam ?Vitals and nursing note reviewed.  ?Constitutional:   ?   General: She is not in acute distress. ?   Appearance: Normal appearance.  ?HENT:  ?   Head: Normocephalic and atraumatic.  ?Eyes:  ?   General:     ?   Right eye: No discharge.     ?   Left eye: No discharge.  ?Cardiovascular:  ?   Comments: Regular rate and rhythm.  S1/S2 are distinct without any evidence of murmur, rubs, or gallops.  Radial pulses are 2+ bilaterally.  Dorsalis pedis pulses are 2+ bilaterally.  No evidence of pedal edema. ?Pulmonary:  ?    Comments: Clear to auscultation bilaterally.  Normal effort.  No respiratory distress.  No evidence of wheezes, rales, or rhonchi heard throughout. ?Abdominal:  ?   General: Abdomen is flat. Bowel sounds are normal. There is no distension.  ?   Tenderness: There is no abdominal tenderness. There is no guarding or rebound.  ?Musculoskeletal:     ?   General: Normal range of motion.  ?   Cervical back: Neck supple.  ?Skin: ?   General: Skin is warm and dry.  ?   Findings: No rash.  ?Neurological:  ?   General: No focal deficit present.  ?   Mental Status: She is alert.  ?   Comments: Cranial nerves II through XII are intact apart from cranial nerve V and there is decrease subjective sensation on the left side of the face.  Pupils are equal round and reactive to light.  Patient talking in complete sentences however she is slower to respond.  Equal strength to the upper and lower extremities.  Decreased subjective sensation to the left upper extremity.  Normal heel-to-shin.  No evidence of dysmetria on finger-nose however she does perform it slower.  No evidence of slurred speech.  ?Psychiatric:     ?   Mood and Affect: Mood normal.     ?   Behavior: Behavior normal.  ? ? ?ED Results / Procedures / Treatments   ?Labs ?(all labs ordered are listed, but only abnormal results are displayed) ?Labs Reviewed  ?CBC WITH DIFFERENTIAL/PLATELET - Abnormal; Notable for the following components:  ?    Result Value  ? WBC 10.9 (*)   ? All other components within normal limits  ?BASIC METABOLIC PANEL - Abnormal; Notable for the following components:  ? Glucose, Bld 105 (*)   ? All other components within normal limits  ?URINALYSIS, ROUTINE W REFLEX MICROSCOPIC - Abnormal; Notable for the following components:  ? APPearance HAZY (*)   ? Hgb urine dipstick MODERATE (*)   ? Leukocytes,Ua MODERATE (*)   ? Bacteria, UA RARE (*)   ? All other components within normal limits  ?I-STAT BETA HCG BLOOD, ED ([facility], [facility], [facility] ONLY)   ? ? ?EKG ?None ? ?Radiology ?CT Head Wo Contrast ? ?Result Date: 03/18/2022 ?CLINICAL DATA:  Headache, altered level of consciousness, syncope and hit head 2 weeks ago EXAM: CT HEAD WITHOUT CONTRAST TECHNIQUE: Contiguous axial images were obtained from the base of the skull through the vertex without intravenous contrast. RADIATION DOSE REDUCTION: This exam was performed according to the departmental dose-optimization program which includes automated exposure control, adjustment of the mA and/or kV according to patient size and/or use of iterative reconstruction technique. COMPARISON:  None. FINDINGS: Brain: No acute infarct  or hemorrhage. Lateral ventricles and midline structures are unremarkable. No acute extra-axial fluid collections. No mass effect. Vascular: No hyperdense vessel or unexpected calcification. Skull: Normal. Negative for fracture or focal lesion. Sinuses/Orbits: No acute finding. Other: None. IMPRESSION: 1. No acute intracranial process. Electronically Signed   By: Sharlet SalinaMichael  Brown M.D.   On: 03/18/2022 18:44  ? ?CT Cervical Spine Wo Contrast ? ?Result Date: 03/18/2022 ?CLINICAL DATA:  Headache, altered level of consciousness, syncope and hit head 2 weeks ago EXAM: CT CERVICAL SPINE WITHOUT CONTRAST TECHNIQUE: Multidetector CT imaging of the cervical spine was performed without intravenous contrast. Multiplanar CT image reconstructions were also generated. RADIATION DOSE REDUCTION: This exam was performed according to the departmental dose-optimization program which includes automated exposure control, adjustment of the mA and/or kV according to patient size and/or use of iterative reconstruction technique. COMPARISON:  09/06/2015 FINDINGS: Alignment: Straightening of the lower cervical spine due to partial bony fusion at the C5-6 disc space, stable. Otherwise alignment is anatomic. Skull base and vertebrae: No acute fracture. No primary bone lesion or focal pathologic process. Soft tissues and  spinal canal: No prevertebral fluid or swelling. No visible canal hematoma. 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 Minimal spondylosis at C6-7 without bony encroachment on t

## 2022-03-18 NOTE — Discharge Instructions (Signed)
I would like for you to follow-up with your primary care provider sometime next week to ensure we have good follow-up.  Return to the emergency department for any worsening symptoms you might have. ?

## 2022-03-18 NOTE — ED Triage Notes (Signed)
Headache with cognitive and mental disruption per patient. ?

## 2022-03-18 NOTE — ED Triage Notes (Signed)
States she had a syncopal episode 2 weeks ago and hit head ?

## 2022-03-23 ENCOUNTER — Encounter (HOSPITAL_COMMUNITY): Payer: Self-pay | Admitting: Psychiatry

## 2022-03-23 ENCOUNTER — Telehealth (INDEPENDENT_AMBULATORY_CARE_PROVIDER_SITE_OTHER): Payer: Self-pay | Admitting: Psychiatry

## 2022-03-23 DIAGNOSIS — F900 Attention-deficit hyperactivity disorder, predominantly inattentive type: Secondary | ICD-10-CM

## 2022-03-23 DIAGNOSIS — F331 Major depressive disorder, recurrent, moderate: Secondary | ICD-10-CM

## 2022-03-23 DIAGNOSIS — F4310 Post-traumatic stress disorder, unspecified: Secondary | ICD-10-CM

## 2022-03-23 MED ORDER — ALPRAZOLAM 1 MG PO TABS: 1.0000 mg | ORAL_TABLET | Freq: Two times a day (BID) | ORAL | 2 refills | Status: DC | PRN

## 2022-03-23 MED ORDER — AMPHETAMINE-DEXTROAMPHET ER 30 MG PO CP24: 30.0000 mg | ORAL_CAPSULE | Freq: Every day | ORAL | 0 refills | Status: DC

## 2022-03-23 MED ORDER — AMPHETAMINE-DEXTROAMPHET ER 30 MG PO CP24: 30.0000 mg | ORAL_CAPSULE | ORAL | 0 refills | Status: DC

## 2022-03-23 MED ORDER — AMPHETAMINE-DEXTROAMPHET ER 30 MG PO CP24
30.0000 mg | ORAL_CAPSULE | Freq: Every day | ORAL | 0 refills | Status: DC
Start: 2022-03-23 — End: 2022-07-13

## 2022-03-23 MED ORDER — FLUOXETINE HCL 40 MG PO CAPS: ORAL_CAPSULE | ORAL | 2 refills | Status: DC

## 2022-03-23 MED ORDER — QUETIAPINE FUMARATE 100 MG PO TABS: 100.0000 mg | ORAL_TABLET | Freq: Every day | ORAL | 2 refills | Status: DC

## 2022-03-23 NOTE — Progress Notes (Signed)
Virtual Visit via Telephone Note ? ?I connected with Sandra Glover on 03/23/22 at 11:40 AM EDT by telephone and verified that I am speaking with the correct person using two identifiers. ? ?Location: ?Patient: home ?Provider: office ?  ?I discussed the limitations, risks, security and privacy concerns of performing an evaluation and management service by telephone and the availability of in person appointments. I also discussed with the patient that there may be a patient responsible charge related to this service. The patient expressed understanding and agreed to proceed. ? ? ? ?  ?I discussed the assessment and treatment plan with the patient. The patient was provided an opportunity to ask questions and all were answered. The patient agreed with the plan and demonstrated an understanding of the instructions. ?  ?The patient was advised to call back or seek an in-person evaluation if the symptoms worsen or if the condition fails to improve as anticipated. ? ?I provided 15 minutes of non-face-to-face time during this encounter. ? ? ?[name], MD ? ?BH MD/PA/NP OP Progress Note ? ?03/23/2022 11:55 AM ?Sandra Glover  ?MRN:  [id number] ? ?Chief Complaint:  ?Chief Complaint  ?Patient presents with  ? ADHD  ? Depression  ? Anxiety  ? Manic Behavior  ? Follow-up  ? ?HPI: This patient is a 36 year old married white female who lives with her husband and 2 children in [place].  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 ? ?The patient returns for follow-up after 3 months.  She states about 2 weeks ago she fell when she got up in the middle of the night.  She thinks she was unsteady because of the Seroquel that she takes.  She hit her head and think she was knocked out "just for a minute or so."   She had headaches for the first few days and then started to experience mental fuzziness and memory loss.  She was seen last week in the emergency room with no abnormal findings.  She states the  headaches have subsided and her memory is getting a lot better.  I explained that if she gets up at night she has to do it extremely slowly and carefully and make sure she is well-hydrated. ? ?In terms of mood she is doing very well.  She denies significant depression anxiety difficulty concentrating or thoughts of self-harm or suicide.  She does get more irritable and anxious around the time of her menstrual cycle and I urged her to use the Xanax during that time.  She is generally sleeping well.  Her energy is good ?Visit Diagnosis:  ?  ICD-10-CM   ?1. Major depressive disorder, recurrent episode, moderate (HCC)  F33.1   ?  ?2. Attention deficit hyperactivity disorder (ADHD), predominantly inattentive type  F90.0   ?  ?3. PTSD (post-traumatic stress disorder)  F43.10   ?  ? ? ?Past Psychiatric History: Long-term outpatient treatment ? ?Past Medical History:  ?Past Medical History:  ?Diagnosis Date  ? Anxiety   ? Anxiety   ? Depression   ? History of anemia 2013  ? History of depression   ? states stopped med. in 2013  ? History of partial nephrectomy age 60 mos.  ? right - due to kidney infection  ? Hypertension   ? Osteopenia   ? Pertussis   ? Postcoital bleeding 07/14/2015  ? Sinus infection 01/25/2013  ? started antibiotic 01/24/2013 x 10 days; nasal congestion, clear drainage from nose  ?  Tonsillar and adenoid hypertrophy 01/2013  ? Vaginal irritation 03/25/2014  ? Mild yeast but has history of yeast, will not treat til after first trimester,try yogurt  ?  ?Past Surgical History:  ?Procedure Laterality Date  ? CHOLECYSTECTOMY  04/23/2010  ? laparoscopic  ? COLONOSCOPY WITH PROPOFOL N/A 05/17/2016  ? Procedure: COLONOSCOPY WITH PROPOFOL;  Surgeon: West BaliSandi L Fields, MD;  [facility];  Service: Endoscopy;  Laterality: N/A;  1000  ? HEMORRHOID BANDING N/A 05/17/2016  ? Procedure: HEMORRHOID BANDING;  Surgeon: West BaliSandi L Fields, MD;  [facility]: [facility];  Service: Endoscopy;  Laterality: N/A;  ? HEMORRHOID  SURGERY N/A 06/08/2016  ? Procedure: SIMPLE HEMORRHOIDECTOMY;  Surgeon: Ancil LinseyJason Evan Davis, MD;  Location: [facility];  Service: General;  Laterality: N/A;  ? PARTIAL NEPHRECTOMY Right age 34 mos.  ? TONSILLECTOMY AND ADENOIDECTOMY N/A 01/29/2013  ? Procedure: TONSILLECTOMY AND ADENOIDECTOMY;  Surgeon: Darletta MollSui W Teoh, MD;  Location: [facility];  Service: ENT;  Laterality: N/A;  ? ? ?Family Psychiatric History: see below ? ?Family History:  ?Family History  ?Problem Relation Age of Onset  ? COPD Mother   ? Depression Mother   ? Anxiety disorder Mother   ? Hypertension Mother   ? Irritable bowel syndrome Mother   ? Arthritis Mother   ? Skin cancer Mother   ? Thyroid cancer Mother   ? Ovarian cancer Maternal Aunt   ? Ovarian cancer Maternal Grandmother   ? Cancer Maternal Grandmother   ?     bladder, kidney  ? Hypertension Father   ? GER disease Father   ? Bipolar disorder Paternal Uncle   ? Anxiety disorder Cousin   ? Drug abuse Cousin   ? Bipolar disorder Cousin   ? Colon cancer Neg Hx   ? ? ?Social History:  ?Social History  ? ?Socioeconomic History  ? Marital status: Married  ?  Spouse name: Not on file  ? Number of children: Not on file  ? Years of education: Not on file  ? Highest education level: Not on file  ?Occupational History  ? Not on file  ?Tobacco Use  ? Smoking status: Never  ? Smokeless tobacco: Never  ? Tobacco comments:  ?  Never smoked  ?Vaping Use  ? Vaping Use: Never used  ?Substance and Sexual Activity  ? Alcohol use: Yes  ?  Alcohol/week: 0.0 standard drinks  ?  Comment: socially  ? Drug use: No  ? Sexual activity: Yes  ?  Birth control/protection: Implant  ?  Comment: nexplanon implant  ?Other Topics Concern  ? Not on file  ?Social History Narrative  ? Not on file  ? ?Social Determinants of Health  ? ?Financial Resource Strain: Not on file  ?Food Insecurity: Not on file  ?Transportation Needs: Not on file  ?Physical Activity: Not on file  ?Stress: Not on file  ?Social Connections: Not on file   ? ? ?Allergies:  ?Allergies  ?Allergen Reactions  ? Lortab [Hydrocodone-Acetaminophen] Nausea And Vomiting  ? Morphine And Related Rash  ? ? ?Metabolic Disorder Labs: ?No results found for: HGBA1C, MPG ?No results found for: PROLACTIN ?No results found for: CHOL, TRIG, HDL, CHOLHDL, VLDL, LDLCALC ?Lab Results  ?Component Value Date  ? TSH 2.553 10/13/2017  ? ? ?Therapeutic Level Labs: ?No results found for: LITHIUM ?No results found for: VALPROATE ?No components found for:  CBMZ ? ?Current Medications: ?Current Outpatient Medications  ?Medication Sig Dispense Refill  ? amphetamine-dextroamphetamine (ADDERALL XR)  30 MG 24 hr capsule Take 1 capsule (30 mg total) by mouth daily. 30 capsule 0  ? ALPRAZolam (XANAX) 1 MG tablet Take 1 tablet (1 mg total) by mouth 2 (two) times daily as needed for anxiety. 60 tablet 2  ? amphetamine-dextroamphetamine (ADDERALL XR) 30 MG 24 hr capsule Take 1 capsule (30 mg total) by mouth every morning. 30 capsule 0  ? amphetamine-dextroamphetamine (ADDERALL XR) 30 MG 24 hr capsule Take 1 capsule (30 mg total) by mouth daily. 30 capsule 0  ? calcium-vitamin D (OSCAL WITH D) 500-200 MG-UNIT tablet Take 1 tablet by mouth.    ? dicyclomine (BENTYL) 10 MG capsule Take 1 capsule (10 mg total) by mouth 4 (four) times daily -  before meals and at bedtime. 90 capsule 0  ? Drospirenone-Estetrol 3-14.2 MG TABS Take 1 tablet by mouth daily. 28 tablet 12  ? etonogestrel (NEXPLANON) 68 MG IMPL implant 1 each by Subdermal route once.    ? FLUoxetine (PROZAC) 40 MG capsule TAKE 1 CAPSULE BY MOUTH EVERY DAY 30 capsule 2  ? lisinopril-hydrochlorothiazide (ZESTORETIC) 20-12.5 MG tablet Take 1 tablet by mouth daily.    ? meloxicam (MOBIC) 7.5 MG tablet Take 7.5 mg by mouth daily as needed for pain.    ? omeprazole (PRILOSEC) 40 MG capsule TAKE 1 CAPSULE(40 MG) BY MOUTH DAILY (Patient taking differently: Take 40 mg by mouth daily. TAKE 1 CAPSULE(40 MG) BY MOUTH DAILY) 30 capsule 3  ? polyethylene glycol  powder (GLYCOLAX/MIRALAX) 17 GM/SCOOP powder 1-4 scoop daily or as needed 255 g 11  ? QUEtiapine (SEROQUEL) 100 MG tablet Take 1 tablet (100 mg total) by mouth at bedtime. 30 tablet 2  ? ?No current facilit

## 2022-04-16 ENCOUNTER — Other Ambulatory Visit (HOSPITAL_COMMUNITY): Payer: Self-pay | Admitting: Psychiatry

## 2022-05-10 ENCOUNTER — Other Ambulatory Visit: Payer: Self-pay | Admitting: Orthopedic Surgery

## 2022-05-21 ENCOUNTER — Other Ambulatory Visit (HOSPITAL_COMMUNITY): Payer: Self-pay | Admitting: Psychiatry

## 2022-06-09 ENCOUNTER — Ambulatory Visit (INDEPENDENT_AMBULATORY_CARE_PROVIDER_SITE_OTHER): Payer: BLUE CROSS/BLUE SHIELD | Admitting: Gastroenterology

## 2022-06-09 ENCOUNTER — Encounter: Payer: Self-pay | Admitting: Gastroenterology

## 2022-06-09 ENCOUNTER — Telehealth: Payer: Self-pay | Admitting: *Deleted

## 2022-06-09 ENCOUNTER — Encounter: Payer: Self-pay | Admitting: Internal Medicine

## 2022-06-09 VITALS — BP 118/62 | HR 98 | Temp 98.3°F | Ht 63.0 in | Wt 172.0 lb

## 2022-06-09 DIAGNOSIS — R1013 Epigastric pain: Secondary | ICD-10-CM | POA: Diagnosis not present

## 2022-06-09 DIAGNOSIS — K581 Irritable bowel syndrome with constipation: Secondary | ICD-10-CM

## 2022-06-09 DIAGNOSIS — K219 Gastro-esophageal reflux disease without esophagitis: Secondary | ICD-10-CM

## 2022-06-09 MED ORDER — LUBIPROSTONE 24 MCG PO CAPS
24.0000 ug | ORAL_CAPSULE | Freq: Two times a day (BID) | ORAL | 3 refills | Status: DC
Start: 2022-06-09 — End: 2022-06-30

## 2022-06-09 MED ORDER — PANTOPRAZOLE SODIUM 40 MG PO TBEC: 40.0000 mg | DELAYED_RELEASE_TABLET | Freq: Two times a day (BID) | ORAL | 1 refills | Status: DC

## 2022-06-09 NOTE — Telephone Encounter (Signed)
Received refill request for Pantoprazole 40mg  . Pt last OV 06/09/2022

## 2022-06-09 NOTE — Progress Notes (Signed)
GI Office Note    Referring Provider: Drema Halon, FNP Primary Care Physician:  Drema Halon, FNP Primary GI: Dr. Marletta Lor  Date:  06/09/2022  ID:  Sandra Glover, DOB [birth year], MRN [id number]   Chief Complaint   Chief Complaint  Patient presents with   Abdominal Pain    Centralized abdominal pain that travels up to right shoulder. States that it feels like it did when she had gallbladder attacks before her gallbladder was removed.     History of Present Illness  Sandra Glover is a 36 y.o. female with a history of anxiety, depression, anemia, HTN, and IBS presenting today with complaint of abdominal pain.   Last seen in the office 03/12/2019 for follow up of rectal bleeding, abdominal pain, and diarrhea. Reported a previous history of constipation and grade 3 hemorrhoids. Recommended surgical referral for hemorrhoids, was using [facility] hemorrhoid cream. Was doing better at this time, on [place]. Denied any upper or lower GI symptoms. Previous ER visit to this office visit with normal labs and negative GI pathogen panel.  Patient sent a message in July 2020 asking about generic drugs to use as she was unable to afford Linzess or Amitiza.   Last colonoscopy 2017 - moderately redundant colon, rectal bleeding/pain likely due to external and internal hemorrhoids, repeat colonoscopy due at age 70/50.    Today: Abdominal pain - feels like the gallbladder attacks she has had before she had it removed. Starts epigastric region and sharp stabbing pain that's cramping and radiates up to her right shoulder and shoulder blade. Makes her feel like she has trouble breathing and will get severe at times where she feels like she will throw up. Has been bloating and having gas and the bentyl has not been effective. Attacks started about 2 months ago and the frequency has been increasing as well as the severity. Gets worse with eating leafy greens or eating anything greasy.  Has been having worsening reflux symptoms with burning in her throat etc, taking omeprazole 2 hours before her night time medications. Has been on omeprazole for many years. Denies dysphagia.   Reports she had a concussion in April and saw a small hiatal hernia and with another CT a while back they noted an umbilical hernia.   Denies recent fever/chills. Denies vaginal discharge. With her prolapse and outlet dysfunction she has noticed more incontinence. 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 January she last seen GYN, has been doing PT to help with all of this.   Has been more constipated recently, going 4-5 days a week without a bowel movement and then will pass hard painful stool and then diarrhea after that. Has been using miralax every once in a while but does not feel that it is very helpful.  Last bowel movement 2 days ago. Denies melena or hematochezia.    Past Medical History:  Diagnosis Date   Anxiety    Anxiety    Depression    History of anemia 2013   History of depression    states stopped med. 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   Hypertension    Osteopenia    Pertussis    Postcoital bleeding 07/14/2015   Sinus infection 01/25/2013   started antibiotic 01/24/2013 x 10 days; nasal congestion, clear drainage from nose   Tonsillar and adenoid hypertrophy 01/2013   Vaginal irritation 03/25/2014  Mild yeast but has history of yeast, will not treat til after first trimester,try yogurt    Past Surgical History:  Procedure Laterality Date   CHOLECYSTECTOMY  04/23/2010   laparoscopic   COLONOSCOPY WITH PROPOFOL N/A 05/17/2016   Procedure: COLONOSCOPY WITH PROPOFOL;  Surgeon: West Bali, MD;  [facility];  Service: Endoscopy;  Laterality: N/A;  1000   HEMORRHOID BANDING N/A 05/17/2016   Procedure: HEMORRHOID BANDING;  Surgeon: West Bali, MD;  [facility];   Service: Endoscopy;  Laterality: N/A;   HEMORRHOID SURGERY N/A 06/08/2016   Procedure: SIMPLE HEMORRHOIDECTOMY;  Surgeon: Ancil Linsey, MD;  Location: [facility];  Service: General;  Laterality: N/A;   PARTIAL NEPHRECTOMY Right age 72 mos.   TONSILLECTOMY AND ADENOIDECTOMY N/A 01/29/2013   Procedure: TONSILLECTOMY AND ADENOIDECTOMY;  Surgeon: Darletta Moll, MD;  Location: [facility];  Service: ENT;  Laterality: N/A;    Current Outpatient Medications  Medication Sig Dispense Refill   ALPRAZolam (XANAX) 1 MG tablet Take 1 tablet (1 mg total) by mouth 2 (two) times daily as needed for anxiety. 60 tablet 2   amphetamine-dextroamphetamine (ADDERALL XR) 30 MG 24 hr capsule Take 1 capsule (30 mg total) by mouth every morning. 30 capsule 0   amphetamine-dextroamphetamine (ADDERALL XR) 30 MG 24 hr capsule Take 1 capsule (30 mg total) by mouth daily. 30 capsule 0   amphetamine-dextroamphetamine (ADDERALL XR) 30 MG 24 hr capsule Take 1 capsule (30 mg total) by mouth daily. 30 capsule 0   calcium-vitamin D (OSCAL WITH D) 500-200 MG-UNIT tablet Take 1 tablet by mouth.     dicyclomine (BENTYL) 10 MG capsule Take 1 capsule (10 mg total) by mouth 4 (four) times daily -  before meals and at bedtime. 90 capsule 0   Drospirenone-Estetrol 3-14.2 MG TABS Take 1 tablet by mouth daily. 28 tablet 12   etonogestrel (NEXPLANON) 68 MG IMPL implant 1 each by Subdermal route once.     FLUoxetine (PROZAC) 40 MG capsule TAKE 1 CAPSULE BY MOUTH EVERY DAY 30 capsule 2   lisinopril-hydrochlorothiazide (ZESTORETIC) 20-12.5 MG tablet Take 1 tablet by mouth daily.     meloxicam (MOBIC) 7.5 MG tablet Take 7.5 mg by mouth daily as needed for pain.     omeprazole (PRILOSEC) 40 MG capsule TAKE 1 CAPSULE(40 MG) BY MOUTH DAILY (Patient taking differently: Take 40 mg by mouth daily. TAKE 1 CAPSULE(40 MG) BY MOUTH DAILY) 30 capsule 3   polyethylene glycol powder (GLYCOLAX/MIRALAX) 17 GM/SCOOP powder 1-4 scoop daily or as needed  255 g 11   QUEtiapine (SEROQUEL) 100 MG tablet TAKE 1 TABLET BY MOUTH TWICE DAILY 60 tablet 2   No current facility-administered medications for this visit.    Allergies as of 06/09/2022 - Review Complete 06/09/2022  Allergen Reaction Noted   Lortab [hydrocodone-acetaminophen] Nausea And Vomiting 01/29/2013   Morphine and related Rash 01/25/2013    Family History  Problem Relation Age of Onset   COPD Mother    Depression Mother    Anxiety disorder Mother    Hypertension Mother    Irritable bowel syndrome Mother    Arthritis Mother    Skin cancer Mother    Thyroid cancer Mother    Ovarian cancer Maternal Aunt    Ovarian cancer Maternal Grandmother    Cancer Maternal Grandmother        bladder, kidney   Hypertension Father    GER disease Father    Bipolar disorder Paternal  Uncle    Anxiety disorder Cousin    Drug abuse Cousin    Bipolar disorder Cousin    Colon cancer Neg Hx     Social History   Socioeconomic History   Marital status: Married    Spouse name: Not on file   Number of children: Not on file   Years of education: Not on file   Highest education level: Not on file  Occupational History   Not on file  Tobacco Use   Smoking status: Never    Passive exposure: Never   Smokeless tobacco: Never   Tobacco comments:    Never smoked  Vaping Use   Vaping Use: Never used  Substance and Sexual Activity   Alcohol use: Yes    Alcohol/week: 0.0 standard drinks of alcohol    Comment: socially   Drug use: No   Sexual activity: Yes    Birth control/protection: Implant    Comment: nexplanon implant  Other Topics Concern   Not on file  Social History Narrative   Not on file   Social Determinants of Health   Financial Resource Strain: Not on file  Food Insecurity: Not on file  Transportation Needs: Not on file  Physical Activity: Not on file  Stress: Not on file  Social Connections: Not on file     Review of Systems   Gen: Denies fever, chills,  anorexia. Denies fatigue, weakness, weight loss.  CV: Denies chest pain, palpitations, syncope, peripheral edema, and claudication. Resp: Denies dyspnea at rest, cough, wheezing, coughing up blood, and pleurisy. GI: see HPI Derm: Denies rash, itching, dry skin Psych: Denies depression, anxiety, memory loss, confusion. No homicidal or suicidal ideation.  Heme: Denies bruising, bleeding, and enlarged lymph nodes.   Physical Exam   BP 118/62 (BP Location: Right Arm, Patient Position: Sitting, Cuff Size: Normal)   Pulse 98   Temp 98.3 F (36.8 C) (Oral)   Ht 5\' 3"  (1.6 m)   Wt 172 lb (78 kg)   LMP 05/30/2022 (Approximate)   SpO2 100%   BMI 30.47 kg/m   General:   Alert and oriented. No distress noted. Pleasant and cooperative.  Head:  Normocephalic and atraumatic. Eyes:  Conjuctiva clear without scleral icterus. Mouth:  Oral mucosa pink and moist. Good dentition. No lesions. Lungs:  Clear to auscultation bilaterally. No wheezes, rales, or rhonchi. No distress.  Heart:  S1, S2 present without murmurs appreciated.  Abdomen:  +BS, soft, tenderness to epigastric region and mid left upper quadrant. No rebound or guarding. No HSM or masses noted. Rectal: deferred Msk:  Symmetrical without gross deformities. Normal posture. Extremities:  Without edema. Neurologic:  Alert and  oriented x4 Psych:  Alert and cooperative. Normal mood and affect.   Assessment  Sandra Glover is a 36 y.o. female with a history of anxiety, depression, anemia, HTN, pelvic organ prolapse with rectocele and cystocele, and IBS presenting today with complaint of abdominal pain.   Epigastric abdominal pain/GERD: Pain began about 2 months ago and has been increasing in frequency and severity.  Pain at times will radiate to her right shoulder and into her shoulder blade and will occasionally cause her to be nauseous feeling like she will vomit.  She states the pain feels very similar to when she was having  gallbladder attacks prior to having her gallbladder removed.  She does note that it occurs with eating leafy green vegetables or salads as well as fatty/greasy foods.  She does note some worsening reflux  symptoms as well with increased burning in her throat.  She has been on omeprazole 40 mg daily for many years.  Tenderness mostly localized to her epigastric region and mid left upper quadrant.  Suspect that her pain has been increasing due to worsening gastritis/reflux.  She has never had an EGD.  We will change PPI to pantoprazole and increase frequency to twice daily for now we will work toward weaning once symptoms more controlled.  If symptoms continue despite increased PPI therapy would recommend upper endoscopy.  We discussed GERD diet and handout provided today.  Discussed that given her pelvic organ prolapse and pelvic floor dysfunction she could be at high risk for pelvic inflammatory disease and that if symptoms continue despite treatment for worsening reflux control of constipation that she may need to follow-up with GYN as she could be experiencing perihepatitis related to pelvic inflammatory disease but given that her symptoms correlate with diet, this is less likely.  IBS with constipation/bloating: She notes worsening of her constipation recently as well.  Will also have some increased abdominal bloating at times.  States sometimes she will go 4 to 5 days without a bowel movement and then will have hard stool that is painful followed by loose diarrhea.  Has tried MiraLAX in the past without much relief.  Last seen for this in 2020 which she was maintained on Amitiza and doing fairly well but she had a change in insurance that would not cover medication a few months after her last visit therefore she has been managing on her own.  She recently got a new insurance this past January, Linzess was not on formulary therefore we will try Amitiza 24 mcg twice daily again.  Discussed higher fiber diet and  given handout for reference.  For bloating I provided her with examples of foods that are higher in gas production that she should avoid, and advised simethicone use as needed.  This is likely due to worsening constipation as well as reflux.  PLAN   Pantoprazole 40 mg daily, 30 minutes prior to breakfast and dinner. Amitiza 24 mcg twice daily with food. High-fiber diet, handout provided GERD diet lifestyle modifications, handout provided Avoid high gas producing foods, examples provided. Use simethicone over-the-counter as needed Education provided on abdominal bloating Follow-up in 2 months, okay for virtual.    Brooke Bonito, MSN, FNP-BC, AGACNP-BC [facility]

## 2022-06-09 NOTE — Telephone Encounter (Signed)
Noted  

## 2022-06-09 NOTE — Patient Instructions (Addendum)
I would you to stop taking your omeprazole, you should begin taking pantoprazole 40 mg twice daily 30 minutes prior to breakfast and 30 minutes prior to dinner or at least 2 hours before you take your Seroquel in the evenings.  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  I also want you to follow GERD diet, attaching a handout for your reference of foods to avoid.  Avoid gas-producing foods (eg, cabbage, legumes, onions, broccoli, brussel sprouts, wheat, and potatoes).  This should help you with your bloating.  You could also pick up simethicone over-the-counter from the drugstore to use if you are having worsening bloating.  For constipation I have sent in Amitiza 24 mcg for you to take twice daily with food, this should help reduce any nausea that could occur.  I also want you to increase your fiber intake in your diet.  I am attaching examples of higher fiber foods that you can consume, would avoid staying away from wheat as this does cause increased gas production.  I will have you follow-up in about 2 months to see how you are doing (we could do this virtually if you would like), please let me know if you have any trouble obtaining Amitiza.  It was a pleasure to see you today. I want to create trusting relationships with patients. If you receive a survey regarding your visit,  I greatly appreciate you taking time to fill this out on paper or through your MyChart. I value your feedback.  Brooke Bonito, MSN, FNP-BC, AGACNP-BC [facility]

## 2022-06-20 ENCOUNTER — Telehealth: Payer: Self-pay | Admitting: *Deleted

## 2022-06-20 NOTE — Telephone Encounter (Signed)
error 

## 2022-06-22 ENCOUNTER — Telehealth: Payer: Self-pay

## 2022-06-22 NOTE — Telephone Encounter (Signed)
PA for Pantoprazole Sodium 40 mg DR Tablets has been approved from 06/22/2022 through 06/18/2023. Approval letter to be scanned into patient's chart.

## 2022-06-30 ENCOUNTER — Telehealth: Payer: Self-pay | Admitting: *Deleted

## 2022-06-30 DIAGNOSIS — K581 Irritable bowel syndrome with constipation: Secondary | ICD-10-CM

## 2022-06-30 MED ORDER — TRULANCE 3 MG PO TABS: 3.0000 mg | ORAL_TABLET | Freq: Every day | ORAL | 3 refills | Status: DC

## 2022-06-30 NOTE — Telephone Encounter (Signed)
Spoke to pt, she would like you to send in a prescription for Trulance.

## 2022-06-30 NOTE — Telephone Encounter (Signed)
Received denial letter for Amitiza. Pt needs to try Trulance.

## 2022-06-30 NOTE — Addendum Note (Signed)
Addended by: Aida Raider on: 06/30/2022 04:09 PM   Modules accepted: Orders

## 2022-07-12 ENCOUNTER — Telehealth (HOSPITAL_COMMUNITY): Payer: Self-pay

## 2022-07-12 ENCOUNTER — Other Ambulatory Visit (HOSPITAL_COMMUNITY): Payer: Self-pay | Admitting: Psychiatry

## 2022-07-12 MED ORDER — AMPHETAMINE-DEXTROAMPHET ER 30 MG PO CP24: 30.0000 mg | ORAL_CAPSULE | Freq: Every day | ORAL | 0 refills | Status: DC

## 2022-07-12 NOTE — Telephone Encounter (Signed)
Requesting refill on Adderall - [facility] in [place], [place]. [address]. Appt scheduled for tomorrow - 07/13/22. Last seen 03/23/22.

## 2022-07-12 NOTE — Telephone Encounter (Signed)
sent 

## 2022-07-13 ENCOUNTER — Encounter (HOSPITAL_COMMUNITY): Payer: Self-pay | Admitting: Psychiatry

## 2022-07-13 ENCOUNTER — Telehealth (INDEPENDENT_AMBULATORY_CARE_PROVIDER_SITE_OTHER): Payer: Self-pay | Admitting: Psychiatry

## 2022-07-13 ENCOUNTER — Other Ambulatory Visit: Payer: Self-pay | Admitting: Gastroenterology

## 2022-07-13 DIAGNOSIS — F331 Major depressive disorder, recurrent, moderate: Secondary | ICD-10-CM

## 2022-07-13 DIAGNOSIS — K581 Irritable bowel syndrome with constipation: Secondary | ICD-10-CM

## 2022-07-13 DIAGNOSIS — R1013 Epigastric pain: Secondary | ICD-10-CM

## 2022-07-13 DIAGNOSIS — F900 Attention-deficit hyperactivity disorder, predominantly inattentive type: Secondary | ICD-10-CM

## 2022-07-13 DIAGNOSIS — K219 Gastro-esophageal reflux disease without esophagitis: Secondary | ICD-10-CM

## 2022-07-13 DIAGNOSIS — F4310 Post-traumatic stress disorder, unspecified: Secondary | ICD-10-CM

## 2022-07-13 MED ORDER — AMPHETAMINE-DEXTROAMPHET ER 30 MG PO CP24: 30.0000 mg | ORAL_CAPSULE | Freq: Every day | ORAL | 0 refills | Status: DC

## 2022-07-13 MED ORDER — QUETIAPINE FUMARATE 100 MG PO TABS
100.0000 mg | ORAL_TABLET | Freq: Two times a day (BID) | ORAL | 2 refills | Status: DC
Start: 2022-07-13 — End: 2022-12-30

## 2022-07-13 MED ORDER — ALPRAZOLAM 1 MG PO TABS: 1.0000 mg | ORAL_TABLET | Freq: Two times a day (BID) | ORAL | 2 refills | Status: DC | PRN

## 2022-07-13 MED ORDER — FLUOXETINE HCL 40 MG PO CAPS: ORAL_CAPSULE | ORAL | 2 refills | Status: DC

## 2022-07-13 MED ORDER — AMPHETAMINE-DEXTROAMPHET ER 30 MG PO CP24
30.0000 mg | ORAL_CAPSULE | ORAL | 0 refills | Status: DC
Start: 2022-07-13 — End: 2022-12-30

## 2022-07-13 NOTE — Progress Notes (Signed)
Virtual Visit via Telephone Note  I connected with Sandra Glover on 07/13/22 at 11:20 AM EDT by telephone and verified that I am speaking with the correct person using two identifiers.  Location: Patient: home Provider: office   I discussed the limitations, risks, security and privacy concerns of performing an evaluation and management service by telephone and the availability of in person appointments. I also discussed with the patient that there may be a patient responsible charge related to this service. The patient expressed understanding and agreed to proceed.      I discussed the assessment and treatment plan with the patient. The patient was provided an opportunity to ask questions and all were answered. The patient agreed with the plan and demonstrated an understanding of the instructions.   The patient was advised to call back or seek an in-person evaluation if the symptoms worsen or if the condition fails to improve as anticipated.  I provided 12 minutes of non-face-to-face time during this encounter.   Sandra Ruder, MD  [facility] MD/PA/NP OP Progress Note  07/13/2022 11:31 AM [name]  MRN:  [id number]  Chief Complaint:  Chief Complaint  Patient presents with   Depression   Anxiety   ADD   Follow-up   HPI: This patient is a 36 year old married white female who lives with her husband and 2 children in [place].  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  The patient returns for follow-up after 3 months.  She states overall she is doing well.  She still having trouble with irritable bowel and cannot get medications approved through insurance that would help with chronic constipation.  However in terms of mood she is doing well.  She denies significant depression anxiety.  She is focusing well with the Adderall.  She is sleeping well at night.  She is enjoying time with her family this summer.  She denies thoughts of self-harm or  suicide. Visit Diagnosis:    ICD-10-CM   1. Major depressive disorder, recurrent episode, moderate (HCC)  F33.1     2. Attention deficit hyperactivity disorder (ADHD), predominantly inattentive type  F90.0     3. PTSD (post-traumatic stress disorder)  F43.10       Past Psychiatric History: Long-term outpatient treatment  Past Medical History:  Past Medical History:  Diagnosis Date   Anxiety    Anxiety    Depression    History of anemia 2013   History of depression    states stopped med. 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   Hypertension    Osteopenia    Pertussis    Postcoital bleeding 07/14/2015   Sinus infection 01/25/2013   started antibiotic 01/24/2013 x 10 days; nasal congestion, clear drainage from nose   Tonsillar and adenoid hypertrophy 01/2013   Vaginal irritation 03/25/2014   Mild yeast but has history of yeast, will not treat til after first trimester,try yogurt    Past Surgical History:  Procedure Laterality Date   CHOLECYSTECTOMY  04/23/2010   laparoscopic   COLONOSCOPY WITH PROPOFOL N/A 05/17/2016   Procedure: COLONOSCOPY WITH PROPOFOL;  Surgeon: West Bali, MD;  Location: [facility];  Service: Endoscopy;  Laterality: N/A;  1000   HEMORRHOID BANDING N/A 05/17/2016   Procedure: HEMORRHOID BANDING;  Surgeon: West Bali, MD;  Location: [facility];  Service: Endoscopy;  Laterality: N/A;   HEMORRHOID SURGERY N/A  06/08/2016   Procedure: SIMPLE HEMORRHOIDECTOMY;  Surgeon: Ancil Linsey, MD;  Location: [facility];  Service: General;  Laterality: N/A;   PARTIAL NEPHRECTOMY Right age 83 mos.   TONSILLECTOMY AND ADENOIDECTOMY N/A 01/29/2013   Procedure: TONSILLECTOMY AND ADENOIDECTOMY;  Surgeon: Darletta Moll, MD;  Location: [facility];  Service: ENT;  Laterality: N/A;    Family Psychiatric History: See below  Family History:  Family History  Problem Relation Age of Onset   COPD Mother     Depression Mother    Anxiety disorder Mother    Hypertension Mother    Irritable bowel syndrome Mother    Arthritis Mother    Skin cancer Mother    Thyroid cancer Mother    Ovarian cancer Maternal Aunt    Ovarian cancer Maternal Grandmother    Cancer Maternal Grandmother        bladder, kidney   Hypertension Father    GER disease Father    Bipolar disorder Paternal Uncle    Anxiety disorder Cousin    Drug abuse Cousin    Bipolar disorder Cousin    Colon cancer Neg Hx     Social History:  Social History   Socioeconomic History   Marital status: Married    Spouse name: Not on file   Number of children: Not on file   Years of education: Not on file   Highest education level: Not on file  Occupational History   Not on file  Tobacco Use   Smoking status: Never    Passive exposure: Never   Smokeless tobacco: Never   Tobacco comments:    Never smoked  Vaping Use   Vaping Use: Never used  Substance and Sexual Activity   Alcohol use: Yes    Alcohol/week: 0.0 standard drinks of alcohol    Comment: socially   Drug use: No   Sexual activity: Yes    Birth control/protection: Implant    Comment: nexplanon implant  Other Topics Concern   Not on file  Social History Narrative   Not on file   Social Determinants of Health   Financial Resource Strain: Not on file  Food Insecurity: Not on file  Transportation Needs: Not on file  Physical Activity: Not on file  Stress: Not on file  Social Connections: Not on file    Allergies:  Allergies  Allergen Reactions   Lortab [Hydrocodone-Acetaminophen] Nausea And Vomiting   Morphine And Related Rash    Metabolic Disorder Labs: No results found for: "HGBA1C", "MPG" No results found for: "PROLACTIN" No results found for: "CHOL", "TRIG", "HDL", "CHOLHDL", "VLDL", "LDLCALC" Lab Results  Component Value Date   TSH 2.553 10/13/2017    Therapeutic Level Labs: No results found for: "LITHIUM" No results found for:  "VALPROATE" No results found for: "CBMZ"  Current Medications: Current Outpatient Medications  Medication Sig Dispense Refill   ALPRAZolam (XANAX) 1 MG tablet Take 1 tablet (1 mg total) by mouth 2 (two) times daily as needed for anxiety. 60 tablet 2   amphetamine-dextroamphetamine (ADDERALL XR) 30 MG 24 hr capsule Take 1 capsule (30 mg total) by mouth every morning. 30 capsule 0   amphetamine-dextroamphetamine (ADDERALL XR) 30 MG 24 hr capsule Take 1 capsule (30 mg total) by mouth daily. 30 capsule 0   amphetamine-dextroamphetamine (ADDERALL XR) 30 MG 24 hr capsule Take 1 capsule (30 mg total) by mouth daily. 30 capsule 0   calcium-vitamin D (OSCAL WITH D) 500-200 MG-UNIT tablet Take 1 tablet by mouth.  dicyclomine (BENTYL) 10 MG capsule Take 1 capsule (10 mg total) by mouth 4 (four) times daily -  before meals and at bedtime. 90 capsule 0   Drospirenone-Estetrol 3-14.2 MG TABS Take 1 tablet by mouth daily. 28 tablet 12   etonogestrel (NEXPLANON) 68 MG IMPL implant 1 each by Subdermal route once.     FLUoxetine (PROZAC) 40 MG capsule TAKE 1 CAPSULE BY MOUTH EVERY DAY 30 capsule 2   lisinopril-hydrochlorothiazide (ZESTORETIC) 20-12.5 MG tablet Take 1 tablet by mouth daily.     meloxicam (MOBIC) 7.5 MG tablet Take 7.5 mg by mouth daily as needed for pain.     pantoprazole (PROTONIX) 40 MG tablet Take 1 tablet (40 mg total) by mouth 2 (two) times daily. 60 tablet 1   Plecanatide (TRULANCE) 3 MG TABS Take 3 mg by mouth daily. 30 tablet 3   polyethylene glycol powder (GLYCOLAX/MIRALAX) 17 GM/SCOOP powder 1-4 scoop daily or as needed 255 g 11   QUEtiapine (SEROQUEL) 100 MG tablet Take 1 tablet (100 mg total) by mouth 2 (two) times daily. 60 tablet 2   No current facility-administered medications for this visit.     Musculoskeletal: Strength & Muscle Tone: na Gait & Station: na Patient leans: N/A  Psychiatric Specialty Exam: Review of Systems  Gastrointestinal:  Positive for abdominal  pain and constipation.  Genitourinary:  Positive for pelvic pain.  All other systems reviewed and are negative.   There were no vitals taken for this visit.There is no height or weight on file to calculate BMI.  General Appearance: NA  Eye Contact:  NA  Speech:  Clear and Coherent  Volume:  Normal  Mood:  Euthymic  Affect:  NA  Thought Process:  Goal Directed  Orientation:  Full (Time, Place, and Person)  Thought Content: WDL   Suicidal Thoughts:  No  Homicidal Thoughts:  No  Memory:  Immediate;   Good Recent;   Good Remote;   Good  Judgement:  Good  Insight:  Good  Psychomotor Activity:  Normal  Concentration:  Concentration: Good and Attention Span: Good  Recall:  Good  Fund of Knowledge: Good  Language: Good  Akathisia:  No  Handed:  Right  AIMS (if indicated): not done  Assets:  Communication Skills Desire for Improvement Resilience Social Support Talents/Skills  ADL's:  Intact  Cognition: WNL  Sleep:  Good   Screenings: GAD-7    Flowsheet Row Counselor [facility] 09/27/2018 [facility]  Total GAD-7 Score 18      PHQ2-9    Flowsheet Row Video Visit from 07/13/2022 in [facility] Video Visit from 03/23/2022 in [facility] Video Visit from 12/10/2021 in [facility] Video Visit from 11[facility]12/2021 in [facility] Video Visit from 09/23/2021 in [facility]  PHQ-2 Total Score 0 0 0 2 1  PHQ-9 Total Score -- -- -- 8 4      Flowsheet Row Video Visit from 07/13/2022 in [facility] Video Visit from 03/23/2022 in [facility] ED from 03/18/2022 in [facility] EMERGENCY DEPARTMENT  C-SSRS RISK CATEGORY No Risk No Risk No Risk        Assessment  and Plan: This patient is a 36 year old female with a history of depression anxiety ADD and possible bipolar 2 disorder.  She is doing well on her current regimen and has had no further falls.  She  will continue Seroquel 100 mg twice daily for mood stabilization, Adderall XR 30 mg every morning for ADHD, Prozac 40 mg for depression and Xanax 1 mg twice daily for anxiety as needed.  She will return to see me in 3 months  Collaboration of Care: Collaboration of Care: Other provider involved in patient's care AEB also shared with OB/GYN and GI on the epic system  Patient/Guardian was advised Release of Information must be obtained prior to any record release in order to collaborate their care with an outside provider. Patient/Guardian was advised if they have not already done so to contact the registration department to sign all necessary forms in order for [place] to release information regarding their care.   Consent: Patient/Guardian gives verbal consent for treatment and assignment of benefits for services provided during this visit. Patient/Guardian expressed understanding and agreed to proceed.    Sandra Ruder, MD 07/13/2022, 11:31 AM

## 2022-07-15 ENCOUNTER — Telehealth: Payer: Self-pay | Admitting: *Deleted

## 2022-07-15 NOTE — Telephone Encounter (Signed)
Sent in a appeal for Trulance 3mg . Appeal needs to be signed by pt. Faxed consent to pt to sign.

## 2022-07-17 ENCOUNTER — Other Ambulatory Visit: Payer: Self-pay | Admitting: Gastroenterology

## 2022-07-17 DIAGNOSIS — R1013 Epigastric pain: Secondary | ICD-10-CM

## 2022-07-17 DIAGNOSIS — K219 Gastro-esophageal reflux disease without esophagitis: Secondary | ICD-10-CM

## 2022-08-10 NOTE — Telephone Encounter (Signed)
Requested another PA from pharmacy.

## 2022-08-16 NOTE — Telephone Encounter (Signed)
Trulance 3mg  approved from 08/15/2022 - 08/14/2023.

## 2022-08-16 NOTE — Telephone Encounter (Signed)
Trulance 3mg  was approved

## 2022-08-17 ENCOUNTER — Ambulatory Visit: Payer: BLUE CROSS/BLUE SHIELD | Admitting: Gastroenterology

## 2022-08-19 ENCOUNTER — Encounter: Payer: Self-pay | Admitting: Emergency Medicine

## 2022-08-19 ENCOUNTER — Ambulatory Visit
Admission: EM | Admit: 2022-08-19 | Discharge: 2022-08-19 | Disposition: A | Discharge status: Home / Self Care | Payer: BLUE CROSS/BLUE SHIELD | Attending: Nurse Practitioner | Admitting: Nurse Practitioner

## 2022-08-19 ENCOUNTER — Other Ambulatory Visit: Payer: Self-pay

## 2022-08-19 DIAGNOSIS — J069 Acute upper respiratory infection, unspecified: Secondary | ICD-10-CM | POA: Diagnosis not present

## 2022-08-19 DIAGNOSIS — R300 Dysuria: Secondary | ICD-10-CM | POA: Insufficient documentation

## 2022-08-19 DIAGNOSIS — R051 Acute cough: Secondary | ICD-10-CM | POA: Insufficient documentation

## 2022-08-19 LAB — POCT URINALYSIS DIP (MANUAL ENTRY)
Bilirubin, UA: NEGATIVE
Blood, UA: NEGATIVE
Glucose, UA: NEGATIVE mg/dL
Ketones, POC UA: NEGATIVE mg/dL
Nitrite, UA: NEGATIVE
Protein Ur, POC: NEGATIVE mg/dL
Spec Grav, UA: 1.02 (ref 1.010–1.025)
Urobilinogen, UA: 0.2 E.U./dL
pH, UA: 6 (ref 5.0–8.0)

## 2022-08-19 MED ORDER — NITROFURANTOIN MONOHYD MACRO 100 MG PO CAPS: 100.0000 mg | ORAL_CAPSULE | Freq: Two times a day (BID) | ORAL | 0 refills | Status: AC

## 2022-08-19 MED ORDER — BENZONATATE 100 MG PO CAPS: 100.0000 mg | ORAL_CAPSULE | Freq: Three times a day (TID) | ORAL | 0 refills | Status: DC | PRN

## 2022-08-19 MED ORDER — PROMETHAZINE-DM 6.25-15 MG/5ML PO SYRP: 5.0000 mL | ORAL_SOLUTION | Freq: Every evening | ORAL | 0 refills | Status: DC | PRN

## 2022-08-19 NOTE — Discharge Instructions (Addendum)
-   Please start the Macrobid for possible UTI.  We will call you Monday if we need to switch antibiotic base on the urine culture. - Start the cough suppressants to help with the cough.  This may last for a few more weeks before it improves all of the way.  Continue with plenty of hydration.  Follow up here or with PCP if symptoms persist or worsen despite treatment.

## 2022-08-19 NOTE — ED Triage Notes (Addendum)
Pt reports symptoms x2 weeks. Pt reports was seen at pcp and dx with sinus infection and left ear infection. Pt was prescribed abx and reports took last dose of dosycycline. Pt also reports has diflucan to use after abx is finished. PCP reported pt return to their office or urgent care if no improvement with medication.  Pt reports cough is going into my chest.   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

## 2022-08-19 NOTE — ED Provider Notes (Signed)
[facility]    CSN: [id number] Arrival date & time: 08/19/22  0959      History   Chief Complaint Chief Complaint  Patient presents with   Cough    HPI Sandra Glover is a 36 y.o. female.   Patient presents with 2 weeks of upper respiratory symptoms.  She endorses body aches, chills, congested cough with thick, yellow sputum, shortness of breath and chest pain with coughing, nasal congestion, runny nose, postnasal drainage, sneezing, sinus pressure, headache, left ear pain, and fatigue.  She denies chest tightness, chest congestion, sore throat, abdominal pain, nausea/vomiting, diarrhea, and decreased appetite.  No new rash.  Reports she was seen by her primary care provider, diagnosed with an ear infection and sinus infection, and started on doxycycline.  Reports her last dose was this morning.  She has also been taking Tylenol and allergy medication including Allegra and flonase.    Patient also reports urinary symptoms for the past few days.  She endorses dysuria, urinary frequency and urgency, voiding small amounts, change in the odor of her urine, new mid low back pain, suprapubic pressure.  Also having vaginal discharge that she thinks is a yeast infection.  Reports she gets these with antibiotic treatment and has yet to take the fluconazole that her primary care provider prescribed her for after she is finished with the doxycycline.  Patient is competent she is not pregnant-last menstrual period 07/28/2022 and has a Nexplanon.     Past Medical History:  Diagnosis Date   Anxiety    Anxiety    Depression    History of anemia 2013   History of depression    states stopped med. in 2013   History of partial nephrectomy age 77 mos.   right - due to kidney infection   Hypertension    Osteopenia    Pertussis    Postcoital bleeding 07/14/2015   Sinus infection 01/25/2013   started antibiotic 01/24/2013 x 10 days; nasal congestion, clear drainage from nose    Tonsillar and adenoid hypertrophy 01/2013   Vaginal irritation 03/25/2014   Mild yeast but has history of yeast, will not treat til after first trimester,try yogurt    Patient Active Problem List   Diagnosis Date Noted   Major depressive disorder, recurrent episode, moderate ([place]) 06/22/2021   Diarrhea 12/26/2018   Abdominal pain 12/26/2018   Nexplanon insertion 09/25/2017   Hemorrhoids 09/19/2016   Rectal bleeding 03/24/2016   Constipation 03/24/2016   History of preterm delivery 09/09/2014   Pertussis 07/30/2014   History of gestational diabetes 07/24/2014   Cystic fibrosis carrier, 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   Depression with anxiety 03/03/2014    Past Surgical History:  Procedure Laterality Date   CHOLECYSTECTOMY  04/23/2010   laparoscopic   COLONOSCOPY WITH PROPOFOL N/A 05/17/2016   Procedure: COLONOSCOPY WITH PROPOFOL;  Surgeon: Danie Binder, MD;  [facility];  Service: Endoscopy;  Laterality: N/A;  1000   HEMORRHOID BANDING N/A 05/17/2016   Procedure: HEMORRHOID BANDING;  Surgeon: Danie Binder, MD;  [facility];  Service: Endoscopy;  Laterality: N/A;   HEMORRHOID SURGERY N/A 06/08/2016   Procedure: SIMPLE HEMORRHOIDECTOMY;  Surgeon: Vickie Epley, MD;  Location: [facility];  Service: General;  Laterality: N/A;   PARTIAL NEPHRECTOMY Right age 66 mos.   TONSILLECTOMY AND ADENOIDECTOMY N/A 01/29/2013   Procedure: TONSILLECTOMY AND ADENOIDECTOMY;  Surgeon: Ascencion Dike, MD;  Location: [place];  Service: ENT;  Laterality: N/A;  OB History     Gravida  2   Para  2   Term  1   Preterm  1   AB      Living  2      SAB      IAB      Ectopic      Multiple      Live Births  2            Home Medications    Prior to Admission medications   Medication Sig Start Date End Date Taking? Authorizing Provider  benzonatate (TESSALON) 100 MG capsule Take 1 capsule (100 mg total) by mouth 3 (three) times daily as needed for  cough. Do not take with alcohol or while driving or operating heavy machinery 08/19/22  Yes Valentino Nose, NP  nitrofurantoin, macrocrystal-monohydrate, (MACROBID) 100 MG capsule Take 1 capsule (100 mg total) by mouth 2 (two) times daily for 5 days. 08/19/22 08/24/22 Yes Valentino Nose, NP  promethazine-dextromethorphan (PROMETHAZINE-DM) 6.25-15 MG/5ML syrup Take 5 mLs by mouth at bedtime as needed for cough. Do not take with alcohol or while driving or operating heavy machinery 08/19/22  Yes Valentino Nose, NP  ALPRAZolam Prudy Feeler) 1 MG tablet Take 1 tablet (1 mg total) by mouth 2 (two) times daily as needed for anxiety. 07/13/22 07/13/23  Myrlene Broker, MD  amphetamine-dextroamphetamine (ADDERALL XR) 30 MG 24 hr capsule Take 1 capsule (30 mg total) by mouth every morning. 07/13/22   Myrlene Broker, MD  amphetamine-dextroamphetamine (ADDERALL XR) 30 MG 24 hr capsule Take 1 capsule (30 mg total) by mouth daily. 07/13/22   Myrlene Broker, MD  amphetamine-dextroamphetamine (ADDERALL XR) 30 MG 24 hr capsule Take 1 capsule (30 mg total) by mouth daily. 07/13/22   Myrlene Broker, MD  calcium-vitamin D (OSCAL WITH D) 500-200 MG-UNIT tablet Take 1 tablet by mouth.    [provider]  dicyclomine (BENTYL) 10 MG capsule Take 1 capsule (10 mg total) by mouth 4 (four) times daily -  before meals and at bedtime. 06/17/19   Anice Paganini, NP  Drospirenone-Estetrol 3-14.2 MG TABS Take 1 tablet by mouth daily. 12/22/21   Lazaro Arms, MD  etonogestrel (NEXPLANON) 68 MG IMPL implant 1 each by Subdermal route once.    [provider]  FLUoxetine (PROZAC) 40 MG capsule TAKE 1 CAPSULE BY MOUTH EVERY DAY 07/13/22   Myrlene Broker, MD  lisinopril-hydrochlorothiazide (ZESTORETIC) 20-12.5 MG tablet Take 1 tablet by mouth daily.    [provider]  meloxicam (MOBIC) 7.5 MG tablet Take 7.5 mg by mouth daily as needed for pain. 08/15/19   [provider]  pantoprazole (PROTONIX) 40 MG  tablet TAKE 1 TABLET(40 MG) BY MOUTH TWICE DAILY 07/19/22   Gelene Mink, NP  polyethylene glycol powder [facility]) 17 GM/SCOOP powder 1-4 scoop daily or as needed 12/17/21   Lazaro Arms, MD  QUEtiapine (SEROQUEL) 100 MG tablet Take 1 tablet (100 mg total) by mouth 2 (two) times daily. 07/13/22   Myrlene Broker, MD  TRULANCE 3 MG TABS TAKE 1 TABLET BY MOUTH DAILY 07/13/22   Aida Raider, NP    Family History Family History  Problem Relation Age of Onset   COPD Mother    Depression Mother    Anxiety disorder Mother    Hypertension Mother    Irritable bowel syndrome Mother    Arthritis Mother    Skin cancer Mother  Thyroid cancer Mother    Ovarian cancer Maternal Aunt    Ovarian cancer Maternal Grandmother    Cancer Maternal Grandmother        bladder, kidney   Hypertension Father    GER disease Father    Bipolar disorder Paternal Uncle    Anxiety disorder Cousin    Drug abuse Cousin    Bipolar disorder Cousin    Colon cancer Neg Hx     Social History Social History   Tobacco Use   Smoking status: Never    Passive exposure: Never   Smokeless tobacco: Never   Tobacco comments:    Never smoked  Vaping Use   Vaping Use: Never used  Substance Use Topics   Alcohol use: Yes    Alcohol/week: 0.0 standard drinks of alcohol    Comment: socially   Drug use: No     Allergies   Lortab [hydrocodone-acetaminophen] and Morphine and related   Review of Systems Review of Systems Per HPI  Physical Exam Triage Vital Signs ED Triage Vitals  Enc Vitals Group     BP 08/19/22 1300 107/75     Pulse Rate 08/19/22 1300 94     Resp 08/19/22 1300 20     Temp 08/19/22 1300 98.4 F (36.9 C)     Temp Source 08/19/22 1300 Oral     SpO2 08/19/22 1300 96 %     Weight --      Height --      Head Circumference --      Peak Flow --      Pain Score 08/19/22 1304 5     Pain Loc --      Pain Edu? --      Excl. in [place]? --    No data found.  Updated Vital Signs BP  107/75 (BP Location: Right Arm)   Pulse 94   Temp 98.4 F (36.9 C) (Oral)   Resp 20   SpO2 96%   Visual Acuity Right Eye Distance:   Left Eye Distance:   Bilateral Distance:    Right Eye Near:   Left Eye Near:    Bilateral Near:     Physical Exam Vitals and nursing note reviewed.  Constitutional:      General: She is not in acute distress.    Appearance: Normal appearance. She is not ill-appearing or toxic-appearing.  HENT:     Head: Normocephalic and atraumatic.     Right Ear: Tympanic membrane, ear canal and external ear normal.     Left Ear: Tympanic membrane, ear canal and external ear normal.     Nose: Congestion and rhinorrhea present.     Mouth/Throat:     Mouth: Mucous membranes are moist.     Pharynx: Oropharynx is clear. Posterior oropharyngeal erythema present. No oropharyngeal exudate.  Eyes:     General: No scleral icterus.    Extraocular Movements: Extraocular movements intact.  Cardiovascular:     Rate and Rhythm: Normal rate and regular rhythm.  Pulmonary:     Effort: Pulmonary effort is normal. No respiratory distress.     Breath sounds: Normal breath sounds. No wheezing, rhonchi or rales.  Abdominal:     General: Abdomen is flat. Bowel sounds are normal. There is no distension.     Palpations: Abdomen is soft.     Tenderness: There is no abdominal tenderness. There is no right CVA tenderness, left CVA tenderness or guarding.  Musculoskeletal:     Cervical back: Normal range  of motion and neck supple.  Lymphadenopathy:     Cervical: No cervical adenopathy.  Skin:    General: Skin is warm and dry.     Coloration: Skin is not jaundiced or pale.     Findings: No erythema or rash.  Neurological:     Mental Status: She is alert and oriented to person, place, and time.  Psychiatric:        Behavior: Behavior is cooperative.      UC Treatments / Results  Labs (all labs ordered are listed, but only abnormal results are displayed) Labs Reviewed   POCT URINALYSIS DIP (MANUAL ENTRY) - Abnormal; Notable for the following components:      Result Value   Leukocytes, UA Small (1+) (*)    All other components within normal limits  URINE CULTURE    EKG   Radiology No results found.  Procedures Procedures (including critical care time)  Medications Ordered in UC Medications - No data to display  Initial Impression / Assessment and Plan / UC Course  I have reviewed the triage vital signs and the nursing notes.  Pertinent labs & imaging results that were available during my care of the patient were reviewed by me and considered in my medical decision making (see chart for details).    Patient is well-appearing, normotensive, afebrile, not tachycardic, not tachypneic, oxygenating well on room air.  Regarding upper respiratory symptoms, we discussed that her symptoms are consistent with post upper respiratory infection cough.  No signs or symptoms of pneumonia today; lung sounds are clear to auscultation and she is oxygenating well on room air.  Start cough suppressants.  Ongoing supportive care discussed.  Urinalysis shows small amount of leukocytes; given symptoms, will treat with Macrobid while urine culture is pending.  ER precautions and return precautions discussed.  The patient was given the opportunity to ask questions.  All questions answered to their satisfaction.  The patient is in agreement to this plan.   Final Clinical Impressions(s) / UC Diagnoses   Final diagnoses:  Dysuria  Acute cough  Acute upper respiratory infection     Discharge Instructions      - Please start the Macrobid for possible UTI.  We will call you Monday if we need to switch antibiotic base on the urine culture. - Start the cough suppressants to help with the cough.  This may last for a few more weeks before it improves all of the way.  Continue with plenty of hydration.  Follow up here or with PCP if symptoms persist or worsen despite  treatment.     ED Prescriptions     Medication Sig Dispense Auth. Provider   nitrofurantoin, macrocrystal-monohydrate, (MACROBID) 100 MG capsule Take 1 capsule (100 mg total) by mouth 2 (two) times daily for 5 days. 10 capsule Cathlean Marseilles A, NP   benzonatate (TESSALON) 100 MG capsule Take 1 capsule (100 mg total) by mouth 3 (three) times daily as needed for cough. Do not take with alcohol or while driving or operating heavy machinery 21 capsule Cathlean Marseilles A, NP   promethazine-dextromethorphan (PROMETHAZINE-DM) 6.25-15 MG/5ML syrup Take 5 mLs by mouth at bedtime as needed for cough. Do not take with alcohol or while driving or operating heavy machinery 118 mL Valentino Nose, NP      PDMP not reviewed this encounter.   Valentino Nose, NP 08/19/22 1735

## 2022-08-21 LAB — URINE CULTURE: Culture: 30000 — AB

## 2022-09-27 ENCOUNTER — Encounter: Payer: Self-pay | Admitting: Gastroenterology

## 2022-09-27 ENCOUNTER — Ambulatory Visit: Payer: BLUE CROSS/BLUE SHIELD | Admitting: Gastroenterology

## 2022-10-09 ENCOUNTER — Ambulatory Visit
Admission: EM | Admit: 2022-10-09 | Discharge: 2022-10-09 | Disposition: A | Discharge status: Home / Self Care | Payer: BLUE CROSS/BLUE SHIELD | Attending: Family Medicine | Admitting: Family Medicine

## 2022-10-09 DIAGNOSIS — Z1152 Encounter for screening for COVID-19: Secondary | ICD-10-CM | POA: Diagnosis not present

## 2022-10-09 DIAGNOSIS — J984 Other disorders of lung: Secondary | ICD-10-CM | POA: Insufficient documentation

## 2022-10-09 DIAGNOSIS — Z9049 Acquired absence of other specified parts of digestive tract: Secondary | ICD-10-CM | POA: Diagnosis not present

## 2022-10-09 DIAGNOSIS — B9789 Other viral agents as the cause of diseases classified elsewhere: Secondary | ICD-10-CM | POA: Diagnosis not present

## 2022-10-09 DIAGNOSIS — J329 Chronic sinusitis, unspecified: Secondary | ICD-10-CM | POA: Insufficient documentation

## 2022-10-09 LAB — RESP PANEL BY RT-PCR (FLU A&B, COVID) ARPGX2
Influenza A by PCR: NEGATIVE
Influenza B by PCR: NEGATIVE
SARS Coronavirus 2 by RT PCR: NEGATIVE

## 2022-10-09 MED ORDER — METHYLPREDNISOLONE SODIUM SUCC 125 MG IJ SOLR
80.0000 mg | Freq: Once | INTRAMUSCULAR | Status: AC
  Administered 2022-10-09: 80 mg via INTRAMUSCULAR

## 2022-10-09 NOTE — ED Provider Notes (Signed)
[facility]    CSN: [id number] Arrival date & time: 10/09/22  9892      History   Chief Complaint No chief complaint on file.   HPI Sandra Glover is a 36 y.o. female.   Presenting today with 4-day history of nasal congestion, sinus pain and pressure, left ear pain, cough.  Denies known fever, chills, body aches, chest pain, shortness of breath.  Taking DayQuil, Flonase, Allegra with no relief.  Multiple sick contacts recently.  History of seasonal allergies, no chronic pulmonary disease noted.    Past Medical History:  Diagnosis Date   Anxiety    Anxiety    Depression    History of anemia 2013   History of depression    states stopped med. 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   Hypertension    Osteopenia    Pertussis    Postcoital bleeding 07/14/2015   Sinus infection 01/25/2013   started antibiotic 01/24/2013 x 10 days; nasal congestion, clear drainage from nose   Tonsillar and adenoid hypertrophy 01/2013   Vaginal irritation 03/25/2014   Mild yeast but has history of yeast, will not treat til after first trimester,try yogurt    Patient Active Problem List   Diagnosis Date Noted   Major depressive disorder, recurrent episode, moderate ([place]) 06/22/2021   Diarrhea 12/26/2018   Abdominal pain 12/26/2018   Nexplanon insertion 09/25/2017   Hemorrhoids 09/19/2016   Rectal bleeding 03/24/2016   Constipation 03/24/2016   History of preterm delivery 09/09/2014   Pertussis 07/30/2014   History of gestational diabetes 07/24/2014   Cystic fibrosis carrier, 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   Depression with anxiety 03/03/2014    Past Surgical History:  Procedure Laterality Date   CHOLECYSTECTOMY  04/23/2010   laparoscopic   COLONOSCOPY WITH PROPOFOL N/A 05/17/2016   Procedure: COLONOSCOPY WITH PROPOFOL;  Surgeon: Danie Binder, MD;  [facility];  Service: Endoscopy;  Laterality: N/A;  1000    HEMORRHOID BANDING N/A 05/17/2016   Procedure: HEMORRHOID BANDING;  Surgeon: Danie Binder, MD;  [facility];  Service: Endoscopy;  Laterality: N/A;   HEMORRHOID SURGERY N/A 06/08/2016   Procedure: SIMPLE HEMORRHOIDECTOMY;  Surgeon: Vickie Epley, MD;  Location[facility];  Service: General;  Laterality: N/A;   PARTIAL NEPHRECTOMY Right age 74 mos.   TONSILLECTOMY AND ADENOIDECTOMY N/A 01/29/2013   Procedure: TONSILLECTOMY AND ADENOIDECTOMY;  Surgeon: Ascencion Dike, MD;  Location: [place];  Service: ENT;  Laterality: N/A;    OB History     Gravida  2   Para  2   Term  1   Preterm  1   AB      Living  2      SAB      IAB      Ectopic      Multiple      Live Births  2            Home Medications    Prior to Admission medications   Medication Sig Start Date End Date Taking? Authorizing Provider  ALPRAZolam Duanne Moron) 1 MG tablet Take 1 tablet (1 mg total) by mouth 2 (two) times daily as needed for anxiety. 07/13/22 07/13/23  Cloria Spring, MD  amphetamine-dextroamphetamine (ADDERALL XR) 30 MG 24 hr capsule Take 1 capsule (30 mg total) by mouth every morning. 07/13/22   Cloria Spring, MD  amphetamine-dextroamphetamine (  ADDERALL XR) 30 MG 24 hr capsule Take 1 capsule (30 mg total) by mouth daily. 07/13/22   Myrlene Broker, MD  amphetamine-dextroamphetamine (ADDERALL XR) 30 MG 24 hr capsule Take 1 capsule (30 mg total) by mouth daily. 07/13/22   Myrlene Broker, MD  benzonatate (TESSALON) 100 MG capsule Take 1 capsule (100 mg total) by mouth 3 (three) times daily as needed for cough. Do not take with alcohol or while driving or operating heavy machinery 08/19/22   Valentino Nose, NP  calcium-vitamin D (OSCAL WITH D) 500-200 MG-UNIT tablet Take 1 tablet by mouth.    [provider]  dicyclomine (BENTYL) 10 MG capsule Take 1 capsule (10 mg total) by mouth 4 (four) times daily -  before meals and at bedtime. 06/17/19   Anice Paganini, NP   Drospirenone-Estetrol 3-14.2 MG TABS Take 1 tablet by mouth daily. 12/22/21   Lazaro Arms, MD  etonogestrel (NEXPLANON) 68 MG IMPL implant 1 each by Subdermal route once.    [provider]  FLUoxetine (PROZAC) 40 MG capsule TAKE 1 CAPSULE BY MOUTH EVERY DAY 07/13/22   Myrlene Broker, MD  lisinopril-hydrochlorothiazide (ZESTORETIC) 20-12.5 MG tablet Take 1 tablet by mouth daily.    [provider]  meloxicam (MOBIC) 7.5 MG tablet Take 7.5 mg by mouth daily as needed for pain. 08/15/19   [provider]  pantoprazole (PROTONIX) 40 MG tablet TAKE 1 TABLET(40 MG) BY MOUTH TWICE DAILY 07/19/22   Gelene Mink, NP  polyethylene glycol powder [facility]) 17 GM/SCOOP powder 1-4 scoop daily or as needed 12/17/21   Lazaro Arms, MD  promethazine-dextromethorphan (PROMETHAZINE-DM) 6.25-15 MG/5ML syrup Take 5 mLs by mouth at bedtime as needed for cough. Do not take with alcohol or while driving or operating heavy machinery 08/19/22   Valentino Nose, NP  QUEtiapine (SEROQUEL) 100 MG tablet Take 1 tablet (100 mg total) by mouth 2 (two) times daily. 07/13/22   Myrlene Broker, MD  TRULANCE 3 MG TABS TAKE 1 TABLET BY MOUTH DAILY 07/13/22   Aida Raider, NP    Family History Family History  Problem Relation Age of Onset   COPD Mother    Depression Mother    Anxiety disorder Mother    Hypertension Mother    Irritable bowel syndrome Mother    Arthritis Mother    Skin cancer Mother    Thyroid cancer Mother    Ovarian cancer Maternal Aunt    Ovarian cancer Maternal Grandmother    Cancer Maternal Grandmother        bladder, kidney   Hypertension Father    GER disease Father    Bipolar disorder Paternal Uncle    Anxiety disorder Cousin    Drug abuse Cousin    Bipolar disorder Cousin    Colon cancer Neg Hx     Social History Social History   Tobacco Use   Smoking status: Never    Passive exposure: Never   Smokeless tobacco: Never   Tobacco comments:     Never smoked  Vaping Use   Vaping Use: Never used  Substance Use Topics   Alcohol use: Yes    Alcohol/week: 0.0 standard drinks of alcohol    Comment: socially   Drug use: No     Allergies   Lortab [hydrocodone-acetaminophen] and Morphine and related   Review of Systems Review of Systems Per HPI  Physical Exam Triage Vital Signs ED Triage Vitals  Enc Vitals Group  BP 10/09/22 1031 105/62     Pulse Rate 10/09/22 1031 85     Resp 10/09/22 1031 20     Temp 10/09/22 1031 98.4 F (36.9 C)     Temp Source 10/09/22 1031 Oral     SpO2 10/09/22 1031 98 %     Weight --      Height --      Head Circumference --      Peak Flow --      Pain Score 10/09/22 1033 4     Pain Loc --      Pain Edu? --      Excl. in [place]? --    No data found.  Updated Vital Signs BP 105/62 (BP Location: Right Arm)   Pulse 85   Temp 98.4 F (36.9 C) (Oral)   Resp 20   LMP 08/30/2022   SpO2 98%   Visual Acuity Right Eye Distance:   Left Eye Distance:   Bilateral Distance:    Right Eye Near:   Left Eye Near:    Bilateral Near:     Physical Exam Vitals and nursing note reviewed.  Constitutional:      Appearance: Normal appearance.  HENT:     Head: Atraumatic.     Right Ear: Tympanic membrane and external ear normal.     Left Ear: Tympanic membrane and external ear normal.     Nose: Congestion present.     Mouth/Throat:     Mouth: Mucous membranes are moist.     Pharynx: Posterior oropharyngeal erythema present.  Eyes:     Extraocular Movements: Extraocular movements intact.     Conjunctiva/sclera: Conjunctivae normal.  Cardiovascular:     Rate and Rhythm: Normal rate and regular rhythm.     Heart sounds: Normal heart sounds.  Pulmonary:     Effort: Pulmonary effort is normal.     Breath sounds: Normal breath sounds. No wheezing or rales.  Musculoskeletal:        General: Normal range of motion.     Cervical back: Normal range of motion and neck supple.  Skin:    General:  Skin is warm and dry.  Neurological:     Mental Status: She is alert and oriented to person, place, and time.  Psychiatric:        Mood and Affect: Mood normal.        Thought Content: Thought content normal.      UC Treatments / Results  Labs (all labs ordered are listed, but only abnormal results are displayed) Labs Reviewed  RESP PANEL BY RT-PCR (FLU A&B, COVID) [id number]    EKG   Radiology No results found.  Procedures Procedures (including critical care time)  Medications Ordered in UC Medications  methylPREDNISolone sodium succinate (SOLU-MEDROL) 125 mg/2 mL injection 80 mg (80 mg Intramuscular Given 10/09/22 1154)    Initial Impression / Assessment and Plan / UC Course  I have reviewed the triage vital signs and the nursing notes.  Pertinent labs & imaging results that were available during my care of the patient were reviewed by me and considered in my medical decision making (see chart for details).     Vital signs benign and reassuring, exam showing evidence of a viral upper respiratory infection causing viral sinusitis.  Respiratory panel pending, treat with IM Solu-Medrol, goal decongestion medications.  Return for worsening symptoms.  Final Clinical Impressions(s) / UC Diagnoses   Final diagnoses:  Viral sinusitis   Discharge Instructions  None    ED Prescriptions   None    PDMP not reviewed this encounter.   Particia Nearing, [place] 10/09/22 1409

## 2022-10-09 NOTE — ED Triage Notes (Signed)
Pt report she think she has a sinus infection. She has left ear pain. Bad sinus pressure with thick yellow mucus. Runny nose and cough since Thursday.  Pt took dayquil yesertday, flonase, and allegra which provided no relief.

## 2022-12-14 ENCOUNTER — Emergency Department (HOSPITAL_COMMUNITY)
Admission: EM | Admit: 2022-12-14 | Discharge: 2022-12-14 | Disposition: A | Discharge status: Home / Self Care | Payer: Medicaid Other | Attending: Emergency Medicine | Admitting: Emergency Medicine

## 2022-12-14 ENCOUNTER — Other Ambulatory Visit: Payer: Self-pay

## 2022-12-14 ENCOUNTER — Encounter (HOSPITAL_COMMUNITY): Payer: Self-pay | Admitting: *Deleted

## 2022-12-14 ENCOUNTER — Emergency Department (HOSPITAL_COMMUNITY): Payer: Medicaid Other

## 2022-12-14 ENCOUNTER — Telehealth: Payer: Self-pay | Admitting: Orthopedic Surgery

## 2022-12-14 DIAGNOSIS — M436 Torticollis: Secondary | ICD-10-CM | POA: Insufficient documentation

## 2022-12-14 DIAGNOSIS — M542 Cervicalgia: Secondary | ICD-10-CM | POA: Diagnosis present

## 2022-12-14 DIAGNOSIS — Z79899 Other long term (current) drug therapy: Secondary | ICD-10-CM | POA: Insufficient documentation

## 2022-12-14 DIAGNOSIS — I10 Essential (primary) hypertension: Secondary | ICD-10-CM | POA: Insufficient documentation

## 2022-12-14 DIAGNOSIS — Z1152 Encounter for screening for COVID-19: Secondary | ICD-10-CM | POA: Insufficient documentation

## 2022-12-14 HISTORY — DX: Unspecified osteoarthritis, unspecified site: M19.90

## 2022-12-14 LAB — BASIC METABOLIC PANEL
Anion gap: 9 (ref 5–15)
BUN: 13 mg/dL (ref 6–20)
CO2: 24 mmol/L (ref 22–32)
Calcium: 9 mg/dL (ref 8.9–10.3)
Chloride: 102 mmol/L (ref 98–111)
Creatinine, Ser: 0.85 mg/dL (ref 0.44–1.00)
GFR, Estimated: >60 mL/min (ref >60)
Glucose, Bld: 122 mg/dL — ABNORMAL HIGH (ref 70–99)
Potassium: 4.1 mmol/L (ref 3.5–5.1)
Sodium: 135 mmol/L (ref 135–145)

## 2022-12-14 LAB — CBC WITH DIFFERENTIAL/PLATELET
Abs Immature Granulocytes: 0.03 10*3/uL (ref 0.00–0.07)
Basophils Absolute: 0 10*3/uL (ref 0.0–0.1)
Basophils Relative: 1 %
Eosinophils Absolute: 0.2 10*3/uL (ref 0.0–0.5)
Eosinophils Relative: 2 %
HCT: 38.7 % (ref 36.0–46.0)
Hemoglobin: 13.1 g/dL (ref 12.0–15.0)
Immature Granulocytes: 0 %
Lymphocytes Relative: 18 %
Lymphs Abs: 1.6 10*3/uL (ref 0.7–4.0)
MCH: 33 pg (ref 26.0–34.0)
MCHC: 33.9 g/dL (ref 30.0–36.0)
MCV: 97.5 fL (ref 80.0–100.0)
Monocytes Absolute: 0.5 10*3/uL (ref 0.1–1.0)
Monocytes Relative: 6 %
Neutro Abs: 6.2 10*3/uL (ref 1.7–7.7)
Neutrophils Relative %: 73 %
Platelets: 249 10*3/uL (ref 150–400)
RBC: 3.97 MIL/uL (ref 3.87–5.11)
RDW: 12.2 % (ref 11.5–15.5)
WBC: 8.6 10*3/uL (ref 4.0–10.5)
nRBC: 0 % (ref 0.0–0.2)

## 2022-12-14 LAB — URINALYSIS, ROUTINE W REFLEX MICROSCOPIC
Bilirubin Urine: NEGATIVE
Glucose, UA: NEGATIVE mg/dL
Hgb urine dipstick: NEGATIVE
Ketones, ur: NEGATIVE mg/dL
Leukocytes,Ua: NEGATIVE
Nitrite: NEGATIVE
Protein, ur: NEGATIVE mg/dL
Specific Gravity, Urine: 1.011 (ref 1.005–1.030)
pH: 5 (ref 5.0–8.0)

## 2022-12-14 LAB — RESP PANEL BY RT-PCR (RSV, FLU A&B, COVID)  RVPGX2
Influenza A by PCR: NEGATIVE
Influenza B by PCR: NEGATIVE
Resp Syncytial Virus by PCR: NEGATIVE
SARS Coronavirus 2 by RT PCR: NEGATIVE

## 2022-12-14 LAB — POC URINE PREG, ED: Preg Test, Ur: NEGATIVE

## 2022-12-14 MED ORDER — IBUPROFEN 600 MG PO TABS: 600.0000 mg | ORAL_TABLET | Freq: Four times a day (QID) | ORAL | 0 refills | Status: DC | PRN

## 2022-12-14 MED ORDER — KETOROLAC TROMETHAMINE 15 MG/ML IJ SOLN
30.0000 mg | Freq: Once | INTRAMUSCULAR | Status: AC
  Administered 2022-12-14: 30 mg via INTRAVENOUS
  Filled 2022-12-14: qty 2

## 2022-12-14 MED ORDER — SODIUM CHLORIDE 0.9 % IV SOLN: INTRAVENOUS | Status: DC

## 2022-12-14 MED ORDER — METHOCARBAMOL 500 MG PO TABS: 500.0000 mg | ORAL_TABLET | Freq: Two times a day (BID) | ORAL | 0 refills | Status: DC | PRN

## 2022-12-14 MED ORDER — SODIUM CHLORIDE 0.9 % IV BOLUS
1000.0000 mL | Freq: Once | INTRAVENOUS | Status: AC
  Administered 2022-12-14: 1000 mL via INTRAVENOUS

## 2022-12-14 NOTE — Telephone Encounter (Signed)
Patient called, wanting to be seen for neck pain.  She has had x-rays in [place].  She is going to get her notes and x-rays to [place] for review.  Pt's # [phone]

## 2022-12-14 NOTE — ED Notes (Signed)
Pt returned from ct. Nad. States pain is better. Pt still looks uncomfortable. Denies any needs at this time.

## 2022-12-14 NOTE — ED Triage Notes (Signed)
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  Denies fever.  + HA

## 2022-12-14 NOTE — ED Notes (Addendum)
See triage notes. Pt states this is more intense pain to back of neck and headache than usual and has been gradually getting worse. C/o neck stiffness. Denies visual changes at this time.

## 2022-12-14 NOTE — ED Provider Notes (Signed)
[facility] EMERGENCY DEPARTMENT Provider Note   CSN: [id number] Arrival date & time: 12/14/22  1118     History  Chief Complaint  Patient presents with   Neck Pain    Sandra Glover is a 37 y.o. female.  Pt is a 37 yo female with pmhx significant for anemia, depression, depression, anxiety, htn, and pertussis.  Pt said she's had pain in her neck for several years.  She was told by her doctor that she may need injections in her neck.  She said her neck has been doing ok until the past few weeks, when she's had increasing pain.  She also has a headache.  She denies fever.  She denies any neurological sx.  Pt did call pcp, but was unable to get an appt.  She also called ortho who were not able to get her an appt, so she came here.  She did drive here.  She said she had to turn her whole body to see.         Home Medications Prior to Admission medications   Medication Sig Start Date End Date Taking? Authorizing Provider  ALPRAZolam Prudy Feeler) 1 MG tablet Take 1 tablet (1 mg total) by mouth 2 (two) times daily as needed for anxiety. 07/13/22 07/13/23  Myrlene Broker, MD  amphetamine-dextroamphetamine (ADDERALL XR) 30 MG 24 hr capsule Take 1 capsule (30 mg total) by mouth every morning. 07/13/22   Myrlene Broker, MD  amphetamine-dextroamphetamine (ADDERALL XR) 30 MG 24 hr capsule Take 1 capsule (30 mg total) by mouth daily. 07/13/22   Myrlene Broker, MD  amphetamine-dextroamphetamine (ADDERALL XR) 30 MG 24 hr capsule Take 1 capsule (30 mg total) by mouth daily. 07/13/22   Myrlene Broker, MD  benzonatate (TESSALON) 100 MG capsule Take 1 capsule (100 mg total) by mouth 3 (three) times daily as needed for cough. Do not take with alcohol or while driving or operating heavy machinery 08/19/22   Valentino Nose, NP  calcium-vitamin D (OSCAL WITH D) 500-200 MG-UNIT tablet Take 1 tablet by mouth.    [provider]  dicyclomine (BENTYL) 10 MG capsule Take 1 capsule (10 mg total) by mouth 4  (four) times daily -  before meals and at bedtime. 06/17/19   Anice Paganini, NP  Drospirenone-Estetrol 3-14.2 MG TABS Take 1 tablet by mouth daily. 12/22/21   Lazaro Arms, MD  etonogestrel (NEXPLANON) 68 MG IMPL implant 1 each by Subdermal route once.    [provider]  FLUoxetine (PROZAC) 40 MG capsule TAKE 1 CAPSULE BY MOUTH EVERY DAY 07/13/22   Myrlene Broker, MD  ibuprofen (ADVIL) 600 MG tablet Take 1 tablet (600 mg total) by mouth every 6 (six) hours as needed. 12/14/22  Yes Jacalyn Lefevre, MD  lisinopril-hydrochlorothiazide (ZESTORETIC) 20-12.5 MG tablet Take 1 tablet by mouth daily.    [provider]  meloxicam (MOBIC) 7.5 MG tablet Take 7.5 mg by mouth daily as needed for pain. 08/15/19   [provider]  methocarbamol (ROBAXIN) 500 MG tablet Take 1 tablet (500 mg total) by mouth 2 (two) times daily as needed for muscle spasms. 12/14/22  Yes Jacalyn Lefevre, MD  pantoprazole (PROTONIX) 40 MG tablet TAKE 1 TABLET(40 MG) BY MOUTH TWICE DAILY 07/19/22   Gelene Mink, NP  polyethylene glycol powder [facility]) 17 GM/SCOOP powder 1-4 scoop daily or as needed 12/17/21   Lazaro Arms, MD  promethazine-dextromethorphan (PROMETHAZINE-DM) 6.25-15 MG/5ML syrup Take 5 mLs by mouth  at bedtime as needed for cough. Do not take with alcohol or while driving or operating heavy machinery 08/19/22   Valentino Nose, NP  QUEtiapine (SEROQUEL) 100 MG tablet Take 1 tablet (100 mg total) by mouth 2 (two) times daily. 07/13/22   Myrlene Broker, MD  TRULANCE 3 MG TABS TAKE 1 TABLET BY MOUTH DAILY 07/13/22   Aida Raider, NP      Allergies    Lortab [hydrocodone-acetaminophen] and Morphine and related    Review of Systems   Review of Systems  Musculoskeletal:  Positive for neck pain.  All other systems reviewed and are negative.   Physical Exam Updated Vital Signs BP 116/83 (BP Location: Right Arm)   Pulse 95   Temp 98.2 F (36.8 C) (Oral)   Resp 18   Ht 5\' 3"  (1.6  m)   Wt 74.8 kg   SpO2 96%   BMI 29.23 kg/m  Physical Exam Vitals and nursing note reviewed.  Constitutional:      Appearance: Normal appearance.  HENT:     Head: Normocephalic and atraumatic.     Right Ear: External ear normal.     Left Ear: External ear normal.     Nose: Nose normal.     Mouth/Throat:     Mouth: Mucous membranes are moist.     Pharynx: Oropharynx is clear.  Eyes:     Extraocular Movements: Extraocular movements intact.     Conjunctiva/sclera: Conjunctivae normal.     Pupils: Pupils are equal, round, and reactive to light.  Cardiovascular:     Rate and Rhythm: Normal rate and regular rhythm.     Pulses: Normal pulses.     Heart sounds: Normal heart sounds.  Pulmonary:     Effort: Pulmonary effort is normal.     Breath sounds: Normal breath sounds.  Abdominal:     General: Abdomen is flat. Bowel sounds are normal.     Palpations: Abdomen is soft.  Musculoskeletal:        General: Normal range of motion.     Cervical back: Spinous process tenderness and muscular tenderness present.  Skin:    General: Skin is warm.     Capillary Refill: Capillary refill takes less than 2 seconds.  Neurological:     General: No focal deficit present.     Mental Status: She is alert and oriented to person, place, and time.  Psychiatric:        Mood and Affect: Mood normal.        Behavior: Behavior normal.     ED Results / Procedures / Treatments   Labs (all labs ordered are listed, but only abnormal results are displayed) Labs Reviewed  BASIC METABOLIC PANEL - Abnormal; Notable for the following components:      Result Value   Glucose, Bld 122 (*)    All other components within normal limits  URINALYSIS, ROUTINE W REFLEX MICROSCOPIC - Abnormal; Notable for the following components:   APPearance CLOUDY (*)    All other components within normal limits  RESP PANEL BY RT-PCR (RSV, FLU A&B, COVID)  RVPGX2  CBC WITH DIFFERENTIAL/PLATELET  POC URINE PREG, ED     EKG None  Radiology CT HEAD WO CONTRAST  Result Date: 12/14/2022 CLINICAL DATA:  Worsening headache and neck pain over the past 2 weeks. No injury. EXAM: CT HEAD WITHOUT CONTRAST CT CERVICAL SPINE WITHOUT CONTRAST TECHNIQUE: Multidetector CT imaging of the head and cervical spine was performed following the standard  protocol without intravenous contrast. Multiplanar CT image reconstructions of the cervical spine were also generated. RADIATION DOSE REDUCTION: This exam was performed according to the departmental dose-optimization program which includes automated exposure control, adjustment of the mA and/or kV according to patient size and/or use of iterative reconstruction technique. COMPARISON:  CT head and cervical spine dated March 18, 2022. FINDINGS: CT HEAD FINDINGS Brain: No evidence of acute infarction, hemorrhage, hydrocephalus, extra-axial collection or mass lesion/mass effect. Vascular: No hyperdense vessel or unexpected calcification. Skull: Normal. Negative for fracture or focal lesion. Sinuses/Orbits: No acute finding. Other: None CT CERVICAL SPINE FINDINGS Alignment: Unchanged reversal of the normal cervical lordosis. Unchanged trace anterolisthesis at C4-C5. Skull base and vertebrae: No acute fracture. No primary bone lesion or focal pathologic process. Soft tissues and spinal canal: No prevertebral fluid or swelling. No visible canal hematoma. Disc levels: Unchanged partial interbody osseous fusion posteriorly at C5-C6. Remaining disc spaces are unremarkable. Upper chest: Negative. Other: None. IMPRESSION: 1. No acute intracranial or cervical spine abnormality. Electronically Signed   By: Titus Dubin M.D.   On: 12/14/2022 13:19   CT Cervical Spine Wo Contrast  Result Date: 12/14/2022 CLINICAL DATA:  Worsening headache and neck pain over the past 2 weeks. No injury. EXAM: CT HEAD WITHOUT CONTRAST CT CERVICAL SPINE WITHOUT CONTRAST TECHNIQUE: Multidetector CT imaging of the head and  cervical spine was performed following the standard protocol without intravenous contrast. Multiplanar CT image reconstructions of the cervical spine were also generated. RADIATION DOSE REDUCTION: This exam was performed according to the departmental dose-optimization program which includes automated exposure control, adjustment of the mA and/or kV according to patient size and/or use of iterative reconstruction technique. COMPARISON:  CT head and cervical spine dated March 18, 2022. FINDINGS: CT HEAD FINDINGS Brain: No evidence of acute infarction, hemorrhage, hydrocephalus, extra-axial collection or mass lesion/mass effect. Vascular: No hyperdense vessel or unexpected calcification. Skull: Normal. Negative for fracture or focal lesion. Sinuses/Orbits: No acute finding. Other: None CT CERVICAL SPINE FINDINGS Alignment: Unchanged reversal of the normal cervical lordosis. Unchanged trace anterolisthesis at C4-C5. Skull base and vertebrae: No acute fracture. No primary bone lesion or focal pathologic process. Soft tissues and spinal canal: No prevertebral fluid or swelling. No visible canal hematoma. Disc levels: Unchanged partial interbody osseous fusion posteriorly at C5-C6. Remaining disc spaces are unremarkable. Upper chest: Negative. Other: None. IMPRESSION: 1. No acute intracranial or cervical spine abnormality. Electronically Signed   By: Titus Dubin M.D.   On: 12/14/2022 13:19    Procedures Procedures    Medications Ordered in ED Medications  sodium chloride 0.9 % bolus 1,000 mL (1,000 mLs Intravenous New Bag/Given 12/14/22 1213)    And  0.9 %  sodium chloride infusion (has no administration in time range)  ketorolac (TORADOL) 15 MG/ML injection 30 mg (30 mg Intravenous Given 12/14/22 1214)    ED Course/ Medical Decision Making/ A&P                           Medical Decision Making Amount and/or Complexity of Data Reviewed Labs: ordered. Radiology: ordered.  Risk Prescription drug  management.   This patient presents to the ED for concern of neck pain, this involves an extensive number of treatment options, and is a complaint that carries with it a high risk of complications and morbidity.  The differential diagnosis includes msk, ddd, cervical radiculopathy   Co morbidities that complicate the patient evaluation  anemia, depression, depression, anxiety,  htn, and pertussis   Additional history obtained:  Additional history obtained from epic chart review  Lab Tests:  I Ordered, and personally interpreted labs.  The pertinent results include:  cbc nl, bmp nl, ua nl, preg neg, covid/flu/rsv neg   Imaging Studies ordered:  I ordered imaging studies including ct head/ct c-spine  I independently visualized and interpreted imaging which showed  CT: 1. No acute intracranial or cervical spine abnormality.  I agree with the radiologist interpretation   Cardiac Monitoring:  The patient was maintained on a cardiac monitor.  I personally viewed and interpreted the cardiac monitored which showed an underlying rhythm of: nsr   Medicines ordered and prescription drug management:  I ordered medication including ivfs and toradol  for sx  Reevaluation of the patient after these medicines showed that the patient improved I have reviewed the patients home medicines and have made adjustments as needed   Test Considered:  ct   Critical Interventions:  Pain control   Problem List / ED Course:  Torticollis:  neck pain and h/a likely due to torticollis.  Ct scans and labs are ok.  Pt is feeling better after meds and is able to turn her head better.  Pt is stable for d/c.  She is to return if worse.  F/u with pcp.   Reevaluation:  After the interventions noted above, I reevaluated the patient and found that they have :improved   Social Determinants of Health:  Lives at home   Dispostion:  After consideration of the diagnostic results and the patients  response to treatment, I feel that the patent would benefit from discharge with outpatient f/u.          Final Clinical Impression(s) / ED Diagnoses Final diagnoses:  Torticollis    Rx / DC Orders ED Discharge Orders          Ordered    ibuprofen (ADVIL) 600 MG tablet  Every 6 hours PRN        12/14/22 1332    methocarbamol (ROBAXIN) 500 MG tablet  2 times daily PRN        12/14/22 1332              Isla Pence, MD 12/14/22 1332

## 2022-12-26 ENCOUNTER — Telehealth: Payer: Self-pay | Admitting: *Deleted

## 2022-12-26 NOTE — Telephone Encounter (Signed)
Received approval letter for Trulance 3mg. Sent copy to scan center.  

## 2022-12-30 ENCOUNTER — Encounter (HOSPITAL_COMMUNITY): Payer: Self-pay | Admitting: Psychiatry

## 2022-12-30 ENCOUNTER — Telehealth (INDEPENDENT_AMBULATORY_CARE_PROVIDER_SITE_OTHER): Payer: Medicaid Other | Admitting: Psychiatry

## 2022-12-30 DIAGNOSIS — F4310 Post-traumatic stress disorder, unspecified: Secondary | ICD-10-CM

## 2022-12-30 DIAGNOSIS — F900 Attention-deficit hyperactivity disorder, predominantly inattentive type: Secondary | ICD-10-CM

## 2022-12-30 DIAGNOSIS — F331 Major depressive disorder, recurrent, moderate: Secondary | ICD-10-CM

## 2022-12-30 MED ORDER — AMPHETAMINE-DEXTROAMPHET ER 30 MG PO CP24: 30.0000 mg | ORAL_CAPSULE | ORAL | 0 refills | Status: DC

## 2022-12-30 MED ORDER — AMPHETAMINE-DEXTROAMPHET ER 30 MG PO CP24: 30.0000 mg | ORAL_CAPSULE | Freq: Every day | ORAL | 0 refills | Status: DC

## 2022-12-30 MED ORDER — FLUOXETINE HCL 40 MG PO CAPS: ORAL_CAPSULE | ORAL | 2 refills | Status: DC

## 2022-12-30 MED ORDER — ALPRAZOLAM 1 MG PO TABS: 1.0000 mg | ORAL_TABLET | Freq: Two times a day (BID) | ORAL | 2 refills | Status: DC | PRN

## 2022-12-30 MED ORDER — QUETIAPINE FUMARATE 100 MG PO TABS: 100.0000 mg | ORAL_TABLET | Freq: Two times a day (BID) | ORAL | 2 refills | Status: DC

## 2022-12-30 NOTE — Progress Notes (Signed)
Virtual Visit via Video Note  I connected with Sandra Glover on 12/30/22 at 11:20 AM EST by a video enabled telemedicine application and verified that I am speaking with the correct person using two identifiers.  Location: Patient: home Provider: home office   I discussed the limitations of evaluation and management by telemedicine and the availability of in person appointments. The patient expressed understanding and agreed to proceed.     I discussed the assessment and treatment plan with the patient. The patient was provided an opportunity to ask questions and all were answered. The patient agreed with the plan and demonstrated an understanding of the instructions.   The patient was advised to call back or seek an in-person evaluation if the symptoms worsen or if the condition fails to improve as anticipated.  I provided 20 minutes of non-face-to-face time during this encounter.   Sandra Spiller, MD  [facility] MD/PA/NP OP Progress Note  12/30/2022 11:25 AM Sandra Glover  MRN:  [id number]  Chief Complaint:  Chief Complaint  Patient presents with   Anxiety   Depression   ADD   Follow-up   HPI: This patient is a 37 year old married white female who lives with her husband and 2 children in [place].  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  The patient returns for follow-up after 3 months.  She states overall her health is good.  She had a recent episode of torticollis or neck strain that was relieved by Toradol in the emergency room.  Other than that she has been doing well.  She states that her mood is stable and she denies significant depression or anxiety.  She is focusing well with the Adderall.  She is sleeping well at night.  She has been gone online courses to get her bachelor's degree in business administration.  She denies any thoughts of suicide or self-harm. Visit Diagnosis:    ICD-10-CM   1. Major depressive disorder, recurrent episode,  moderate (HCC)  F33.1     2. Attention deficit hyperactivity disorder (ADHD), predominantly inattentive type  F90.0     3. PTSD (post-traumatic stress disorder)  F43.10       Past Psychiatric History: Long-term outpatient treatment  Past Medical History:  Past Medical History:  Diagnosis Date   Anxiety    Anxiety    Arthritis    Depression    History of anemia 2013   History of depression    states stopped med. 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 - due to kidney infection   Hypertension    Osteopenia    Pertussis    Postcoital bleeding 07/14/2015   Sinus infection 01/25/2013   started antibiotic 01/24/2013 x 10 days; nasal congestion, clear drainage from nose   Tonsillar and adenoid hypertrophy 01/2013   Vaginal irritation 03/25/2014   Mild yeast but has history of yeast, will not treat til after first trimester,try yogurt    Past Surgical History:  Procedure Laterality Date   CHOLECYSTECTOMY  04/23/2010   laparoscopic   COLONOSCOPY WITH PROPOFOL N/A 05/17/2016   Procedure: COLONOSCOPY WITH PROPOFOL;  Surgeon: Danie Binder, MD;  [facility];  Service: Endoscopy;  Laterality: N/A;  1000   HEMORRHOID BANDING N/A 05/17/2016   Procedure: HEMORRHOID BANDING;  Surgeon: Danie Binder, MD;  Location: [facility];  Service: Endoscopy;  Laterality: N/A;   HEMORRHOID SURGERY N/A 06/08/2016   Procedure: SIMPLE  HEMORRHOIDECTOMY;  Surgeon: Ancil Linsey, MD;  Location: [facility] ORS;  Service: General;  Laterality: N/A;   PARTIAL NEPHRECTOMY Right age 42 mos.   TONSILLECTOMY AND ADENOIDECTOMY N/A 01/29/2013   Procedure: TONSILLECTOMY AND ADENOIDECTOMY;  Surgeon: Darletta Moll, MD;  Location: [facility];  Service: ENT;  Laterality: N/A;    Family Psychiatric History: See below  Family History:  Family History  Problem Relation Age of Onset   COPD Mother    Depression Mother    Anxiety disorder Mother    Hypertension Mother     Irritable bowel syndrome Mother    Arthritis Mother    Skin cancer Mother    Thyroid cancer Mother    Ovarian cancer Maternal Aunt    Ovarian cancer Maternal Grandmother    Cancer Maternal Grandmother        bladder, kidney   Hypertension Father    GER disease Father    Bipolar disorder Paternal Uncle    Anxiety disorder Cousin    Drug abuse Cousin    Bipolar disorder Cousin    Colon cancer Neg Hx     Social History:  Social History   Socioeconomic History   Marital status: Married    Spouse name: Not on file   Number of children: Not on file   Years of education: Not on file   Highest education level: Not on file  Occupational History   Not on file  Tobacco Use   Smoking status: Never    Passive exposure: Never   Smokeless tobacco: Never   Tobacco comments:    Never smoked  Vaping Use   Vaping Use: Never used  Substance and Sexual Activity   Alcohol use: Yes    Alcohol/week: 0.0 standard drinks of alcohol    Comment: socially   Drug use: No   Sexual activity: Yes    Birth control/protection: Implant    Comment: nexplanon implant  Other Topics Concern   Not on file  Social History Narrative   Not on file   Social Determinants of Health   Financial Resource Strain: Not on file  Food Insecurity: Not on file  Transportation Needs: Not on file  Physical Activity: Not on file  Stress: Not on file  Social Connections: Not on file    Allergies:  Allergies  Allergen Reactions   Lortab [Hydrocodone-Acetaminophen] Nausea And Vomiting   Morphine And Related Rash    Metabolic Disorder Labs: No results found for: "HGBA1C", "MPG" No results found for: "PROLACTIN" No results found for: "CHOL", "TRIG", "HDL", "CHOLHDL", "VLDL", "LDLCALC" Lab Results  Component Value Date   TSH 2.553 10/13/2017    Therapeutic Level Labs: No results found for: "LITHIUM" No results found for: "VALPROATE" No results found for: "CBMZ"  Current Medications: Current  Outpatient Medications  Medication Sig Dispense Refill   ALPRAZolam (XANAX) 1 MG tablet Take 1 tablet (1 mg total) by mouth 2 (two) times daily as needed for anxiety. 60 tablet 2   amphetamine-dextroamphetamine (ADDERALL XR) 30 MG 24 hr capsule Take 1 capsule (30 mg total) by mouth every morning. 30 capsule 0   amphetamine-dextroamphetamine (ADDERALL XR) 30 MG 24 hr capsule Take 1 capsule (30 mg total) by mouth daily. 30 capsule 0   amphetamine-dextroamphetamine (ADDERALL XR) 30 MG 24 hr capsule Take 1 capsule (30 mg total) by mouth daily. 30 capsule 0   benzonatate (TESSALON) 100 MG capsule Take 1 capsule (100 mg total) by mouth 3 (three) times daily as  needed for cough. Do not take with alcohol or while driving or operating heavy machinery 21 capsule 0   calcium-vitamin D (OSCAL WITH D) 500-200 MG-UNIT tablet Take 1 tablet by mouth.     dicyclomine (BENTYL) 10 MG capsule Take 1 capsule (10 mg total) by mouth 4 (four) times daily -  before meals and at bedtime. 90 capsule 0   Drospirenone-Estetrol 3-14.2 MG TABS Take 1 tablet by mouth daily. 28 tablet 12   etonogestrel (NEXPLANON) 68 MG IMPL implant 1 each by Subdermal route once.     FLUoxetine (PROZAC) 40 MG capsule TAKE 1 CAPSULE BY MOUTH EVERY DAY 30 capsule 2   ibuprofen (ADVIL) 600 MG tablet Take 1 tablet (600 mg total) by mouth every 6 (six) hours as needed. 30 tablet 0   lisinopril-hydrochlorothiazide (ZESTORETIC) 20-12.5 MG tablet Take 1 tablet by mouth daily.     meloxicam (MOBIC) 7.5 MG tablet Take 7.5 mg by mouth daily as needed for pain.     methocarbamol (ROBAXIN) 500 MG tablet Take 1 tablet (500 mg total) by mouth 2 (two) times daily as needed for muscle spasms. 20 tablet 0   pantoprazole (PROTONIX) 40 MG tablet TAKE 1 TABLET(40 MG) BY MOUTH TWICE DAILY 180 tablet 3   polyethylene glycol powder (GLYCOLAX/MIRALAX) 17 GM/SCOOP powder 1-4 scoop daily or as needed 255 g 11   promethazine-dextromethorphan (PROMETHAZINE-DM) 6.25-15 MG/5ML  syrup Take 5 mLs by mouth at bedtime as needed for cough. Do not take with alcohol or while driving or operating heavy machinery 118 mL 0   QUEtiapine (SEROQUEL) 100 MG tablet Take 1 tablet (100 mg total) by mouth 2 (two) times daily. 60 tablet 2   TRULANCE 3 MG TABS TAKE 1 TABLET BY MOUTH DAILY 30 tablet 3   No current facility-administered medications for this visit.     Musculoskeletal: Strength & Muscle Tone: within normal limits Gait & Station: normal Patient leans: N/A  Psychiatric Specialty Exam: Review of Systems  All other systems reviewed and are negative.   There were no vitals taken for this visit.There is no height or weight on file to calculate BMI.  General Appearance: Casual and Fairly Groomed  Eye Contact:  Good  Speech:  Clear and Coherent  Volume:  Normal  Mood:  Euthymic  Affect:  Appropriate and Congruent  Thought Process:  Goal Directed  Orientation:  Full (Time, Place, and Person)  Thought Content: WDL   Suicidal Thoughts:  No  Homicidal Thoughts:  No  Memory:  Immediate;   Good Recent;   Good Remote;   Good  Judgement:  Good  Insight:  Good  Psychomotor Activity:  Normal  Concentration:  Concentration: Good and Attention Span: Good  Recall:  Good  Fund of Knowledge: Good  Language: Good  Akathisia:  No  Handed:  Right  AIMS (if indicated): not done  Assets:  Communication Skills Desire for Improvement Physical Health Resilience Social Support Talents/Skills Vocational/Educational  ADL's:  Intact  Cognition: WNL  Sleep:  Good   Screenings: GAD-7    Flowsheet Row Counselor from 09/27/2018 in [facility] [place]  Total GAD-7 Score 18      PHQ2-9    Flowsheet Row Video Visit from 07/13/2022 in [facility] [place] Video Visit from 03/23/2022 in [facility] at [place] Video Visit from 12/10/2021 in [facility] at  [place] Video Visit from 11/12/2021 in [facility]  at [facility] Video Visit from 09/23/2021 in [place] at [facility] Total Score 0 0 0 2 1  PHQ-9 Total Score -- -- -- 8 4      Flowsheet Row ED from 12/14/2022 in [facility] at [facility] ED from 10/09/2022 in [facility] ED from 08/19/2022 in [facility] [place] No Risk No Risk No Risk        Assessment and Plan: This patient is a 37 year old female with a history of depression anxiety ADD and possible bipolar 2 disorder.  She continues as well on her current regimen.  She will continue Seroquel 100 mg twice daily for mood stabilization, Adderall XR 30 mg every morning for ADHD, Prozac 40 mg daily for depression and Xanax 1 mg twice daily for anxiety as needed.  She will return to see me in 3 months.  Collaboration of Care: Collaboration of Care: Primary Care Provider AEB note will be shared with PCP at patient's request  Patient/Guardian was advised Release of Information must be obtained prior to any record release in order to collaborate their care with an outside provider. Patient/Guardian was advised if they have not already done so to contact the registration department to sign all necessary forms in order for [place] to release information regarding their care.   Consent: Patient/Guardian gives verbal consent for treatment and assignment of benefits for services provided during this visit. Patient/Guardian expressed understanding and agreed to proceed.    Sandra Spiller, MD 12/30/2022, 11:25 AM

## 2023-01-13 ENCOUNTER — Ambulatory Visit
Admission: EM | Admit: 2023-01-13 | Discharge: 2023-01-13 | Disposition: A | Discharge status: Home / Self Care | Payer: Medicaid Other | Attending: Nurse Practitioner | Admitting: Nurse Practitioner

## 2023-01-13 DIAGNOSIS — R5383 Other fatigue: Secondary | ICD-10-CM | POA: Insufficient documentation

## 2023-01-13 DIAGNOSIS — Z1152 Encounter for screening for COVID-19: Secondary | ICD-10-CM | POA: Diagnosis not present

## 2023-01-13 DIAGNOSIS — J029 Acute pharyngitis, unspecified: Secondary | ICD-10-CM | POA: Insufficient documentation

## 2023-01-13 LAB — POCT RAPID STREP A (OFFICE): Rapid Strep A Screen: NEGATIVE

## 2023-01-13 LAB — POCT INFLUENZA A/B
Influenza A, POC: NEGATIVE
Influenza B, POC: NEGATIVE

## 2023-01-13 NOTE — ED Provider Notes (Signed)
[facility]    CSN: [id number] Arrival date & time: 01/13/23  0844      History   Chief Complaint Chief Complaint  Patient presents with   Sore Throat    HPI Sandra Glover is a 37 y.o. female.   HPI  This has been going on for 1 day of sore throat, headache and cough, . Exposure positive Strep. Denies fever, chills, nasal congestion, sneezing, runny nose, new, new loss of smell or taste, shortness of breath, chest pain, nausea, or diarrhea. No current treatment . Past Medical History:  Diagnosis Date   Anxiety    Anxiety    Arthritis    Depression    History of anemia 2013   History of depression    states stopped med. 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   right - due to kidney infection   Hypertension    Osteopenia    Pertussis    Postcoital bleeding 07/14/2015   Sinus infection 01/25/2013   started antibiotic 01/24/2013 x 10 days; nasal congestion, clear drainage from nose   Tonsillar and adenoid hypertrophy 01/2013   Vaginal irritation 03/25/2014   Mild yeast but has history of yeast, will not treat til after first trimester,try yogurt    Patient Active Problem List   Diagnosis Date Noted   Major depressive disorder, recurrent episode, moderate ([place]) 06/22/2021   Diarrhea 12/26/2018   Abdominal pain 12/26/2018   Nexplanon insertion 09/25/2017   Hemorrhoids 09/19/2016   Rectal bleeding 03/24/2016   Constipation 03/24/2016   History of preterm delivery 09/09/2014   Pertussis 07/30/2014   History of gestational diabetes 07/24/2014   Cystic fibrosis carrier, 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   Depression with anxiety 03/03/2014    Past Surgical History:  Procedure Laterality Date   CHOLECYSTECTOMY  04/23/2010   laparoscopic   COLONOSCOPY WITH PROPOFOL N/A 05/17/2016   Procedure: COLONOSCOPY WITH PROPOFOL;  Surgeon: Danie Binder, MD;  [facility];  Service: Endoscopy;  Laterality: N/A;  1000   HEMORRHOID BANDING N/A  05/17/2016   Procedure: HEMORRHOID BANDING;  Surgeon: Danie Binder, MD;  [facility];  Service: Endoscopy;  Laterality: N/A;   HEMORRHOID SURGERY N/A 06/08/2016   Procedure: SIMPLE HEMORRHOIDECTOMY;  Surgeon: Vickie Epley, MD;  Location: [facility];  Service: General;  Laterality: N/A;   PARTIAL NEPHRECTOMY Right age 64 mos.   TONSILLECTOMY AND ADENOIDECTOMY N/A 01/29/2013   Procedure: TONSILLECTOMY AND ADENOIDECTOMY;  Surgeon: Ascencion Dike, MD;  Location: [facility];  Service: ENT;  Laterality: N/A;    OB History     Gravida  2   Para  2   Term  1   Preterm  1   AB      Living  2      SAB      IAB      Ectopic      Multiple      Live Births  2            Home Medications    Prior to Admission medications   Medication Sig Start Date End Date Taking? Authorizing Provider  amphetamine-dextroamphetamine (ADDERALL XR) 30 MG 24 hr capsule Take 1 capsule (30 mg total) by mouth every morning. 12/30/22   Cloria Spring, MD  amphetamine-dextroamphetamine (ADDERALL XR) 30 MG 24 hr capsule Take 1 capsule (30 mg total) by mouth daily. 12/30/22   Cloria Spring, MD  amphetamine-dextroamphetamine (ADDERALL  XR) 30 MG 24 hr capsule Take 1 capsule (30 mg total) by mouth daily. 12/30/22   Cloria Spring, MD  calcium-vitamin D (OSCAL WITH D) 500-200 MG-UNIT tablet Take 1 tablet by mouth.    [provider]  dicyclomine (BENTYL) 10 MG capsule Take 1 capsule (10 mg total) by mouth 4 (four) times daily -  before meals and at bedtime. 06/17/19   Carlis Stable, NP  Drospirenone-Estetrol 3-14.2 MG TABS Take 1 tablet by mouth daily. 12/22/21   Florian Buff, MD  etonogestrel (NEXPLANON) 68 MG IMPL implant 1 each by Subdermal route once.    [provider]  FLUoxetine (PROZAC) 40 MG capsule TAKE 1 CAPSULE BY MOUTH EVERY DAY 12/30/22   Cloria Spring, MD  ibuprofen (ADVIL) 600 MG tablet Take 1 tablet (600 mg total) by mouth every 6 (six) hours as  needed. 12/14/22   Isla Pence, MD  lisinopril-hydrochlorothiazide (ZESTORETIC) 20-12.5 MG tablet Take 1 tablet by mouth daily.    [provider]  meloxicam (MOBIC) 7.5 MG tablet Take 7.5 mg by mouth daily as needed for pain. 08/15/19   [provider]  pantoprazole (PROTONIX) 40 MG tablet TAKE 1 TABLET(40 MG) BY MOUTH TWICE DAILY 07/19/22   Annitta Needs, NP  polyethylene glycol powder [facility]) 17 GM/SCOOP powder 1-4 scoop daily or as needed 12/17/21   Florian Buff, MD  QUEtiapine (SEROQUEL) 100 MG tablet Take 1 tablet (100 mg total) by mouth 2 (two) times daily. 12/30/22   Cloria Spring, MD  TRULANCE 3 MG TABS TAKE 1 TABLET BY MOUTH DAILY 07/13/22   Sherron Monday, NP    Family History Family History  Problem Relation Age of Onset   COPD Mother    Depression Mother    Anxiety disorder Mother    Hypertension Mother    Irritable bowel syndrome Mother    Arthritis Mother    Skin cancer Mother    Thyroid cancer Mother    Ovarian cancer Maternal Aunt    Ovarian cancer Maternal Grandmother    Cancer Maternal Grandmother        bladder, kidney   Hypertension Father    GER disease Father    Bipolar disorder Paternal Uncle    Anxiety disorder Cousin    Drug abuse Cousin    Bipolar disorder Cousin    Colon cancer Neg Hx     Social History Social History   Tobacco Use   Smoking status: Never    Passive exposure: Never   Smokeless tobacco: Never   Tobacco comments:    Never smoked  Vaping Use   Vaping Use: Never used  Substance Use Topics   Alcohol use: Yes    Alcohol/week: 0.0 standard drinks of alcohol    Comment: socially   Drug use: No     Allergies   Lortab [hydrocodone-acetaminophen] and Morphine and related   Review of Systems Review of Systems   Physical Exam Triage Vital Signs ED Triage Vitals  Enc Vitals Group     BP 01/13/23 0917 121/82     Pulse Rate 01/13/23 0917 100     Resp 01/13/23 0917 20     Temp 01/13/23 0917  98.2 F (36.8 C)     Temp Source 01/13/23 0917 Oral     SpO2 01/13/23 0917 99 %     Weight --      Height --      Head Circumference --  Peak Flow --      Pain Score 01/13/23 0920 6     Pain Loc --      Pain Edu? --      Excl. in [place]? --    No data found.  Updated Vital Signs BP 121/82 (BP Location: Right Arm)   Pulse 100   Temp 98.2 F (36.8 C) (Oral)   Resp 20   LMP 12/02/2022 (Approximate)   SpO2 99%   Visual Acuity Right Eye Distance:   Left Eye Distance:   Bilateral Distance:    Right Eye Near:   Left Eye Near:    Bilateral Near:     Physical Exam Constitutional:      General: She is not in acute distress.    Appearance: She is obese. She is ill-appearing. She is not toxic-appearing or diaphoretic.  HENT:     Head: Normocephalic and atraumatic.     Right Ear: Tympanic membrane normal.     Left Ear: Tympanic membrane normal.     Mouth/Throat:     Mouth: Mucous membranes are moist. Mucous membranes are pale.     Tonsils: No tonsillar exudate or tonsillar abscesses. 0 on the left.  Eyes:     Conjunctiva/sclera: Conjunctivae normal.  Cardiovascular:     Rate and Rhythm: Normal rate and regular rhythm.  Pulmonary:     Effort: Pulmonary effort is normal.     Breath sounds: Normal breath sounds.  Musculoskeletal:     Cervical back: Normal range of motion.  Skin:    General: Skin is warm and dry.     Capillary Refill: Capillary refill takes less than 2 seconds.  Neurological:     General: No focal deficit present.     Mental Status: She is alert and oriented to person, place, and time.  Psychiatric:        Mood and Affect: Mood normal.      UC Treatments / Results  Labs (all labs ordered are listed, but only abnormal results are displayed) Labs Reviewed  SARS CORONAVIRUS 2 (TAT 6-24 HRS)  CULTURE, GROUP A STREP [facility])  POCT RAPID STREP A (OFFICE)  POCT INFLUENZA A/B    EKG   Radiology No results found.  Procedures Procedures (including  critical care time)  Medications Ordered in UC Medications - No data to display  Initial Impression / Assessment and Plan / UC Course  I have reviewed the triage vital signs and the nursing notes.  Pertinent labs & imaging results that were available during my care of the patient were reviewed by me and considered in my medical decision making (see chart for details).     Sore throat Final Clinical Impressions(s) / UC Diagnoses   Final diagnoses:  Sore throat  Fatigue, unspecified type  Acute pharyngitis, unspecified etiology     Discharge Instructions      Your COVID, Influenza are pending. Your Strep Test is negative. You may have an Upper Respiratory Infection. We encourage conservative treatment with symptom relief. We encourage you to use Tylenol alternating with Ibuprofen for your fever if not contraindicated. (Remember to use as directed do not exceed daily dosing recommendations) We also encourage salt water gargles for your sore throat. You should also consider throat lozenges and chloraseptic spray.  Your cough can be soothed with a cough suppressant.     ED Prescriptions   None    PDMP not reviewed this encounter.   Dionisio David [place], [place] 01/13/23 [phone]

## 2023-01-13 NOTE — Discharge Instructions (Addendum)
Your COVID, Influenza are pending. Your Strep Test is negative. You may have an Upper Respiratory Infection. We encourage conservative treatment with symptom relief. We encourage you to use Tylenol alternating with Ibuprofen for your fever if not contraindicated. (Remember to use as directed do not exceed daily dosing recommendations) We also encourage salt water gargles for your sore throat. You should also consider throat lozenges and chloraseptic spray.  Your cough can be soothed with a cough suppressant.

## 2023-01-13 NOTE — ED Triage Notes (Signed)
Pt reports throat pain, headache, congestion, and coughing x 1 day. Pt states her son has strep throat currently at their home.

## 2023-01-14 LAB — SARS CORONAVIRUS 2 (TAT 6-24 HRS): SARS Coronavirus 2: NEGATIVE

## 2023-01-16 LAB — CULTURE, GROUP A STREP (THRC)

## 2023-01-28 IMAGING — CT CT HEAD W/O CM
4 series · 17 of 47 positions shown, 19 images · non-contrast
Comparison: None.

CLINICAL DATA: Headache, altered level of consciousness, syncope
and hit head 2 weeks ago



[Series 3: head w o · axial · 0.46mm/px · z∈[-548,-428]mm · 7 of 33 slices shown, 9 images]
[im 5/33  brain]
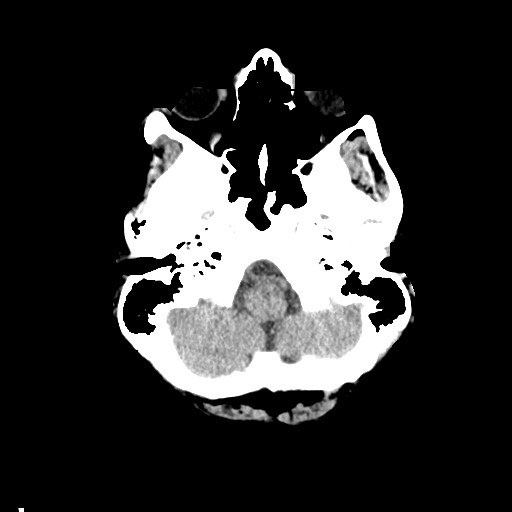
[im 5/33  bone]
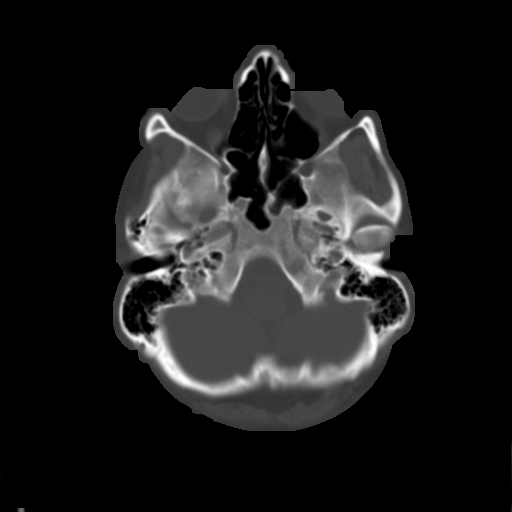
[im 9/33  brain]
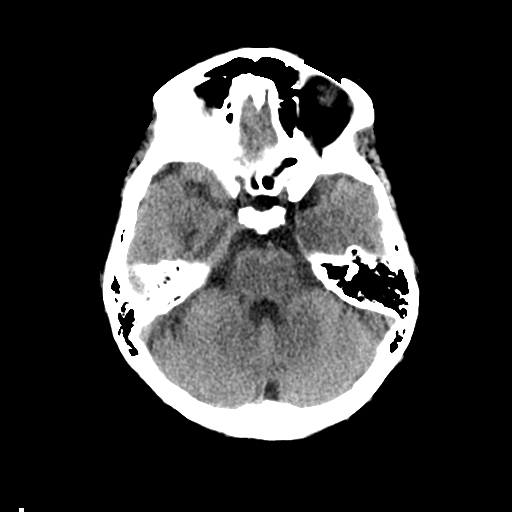
[im 13/33  brain]
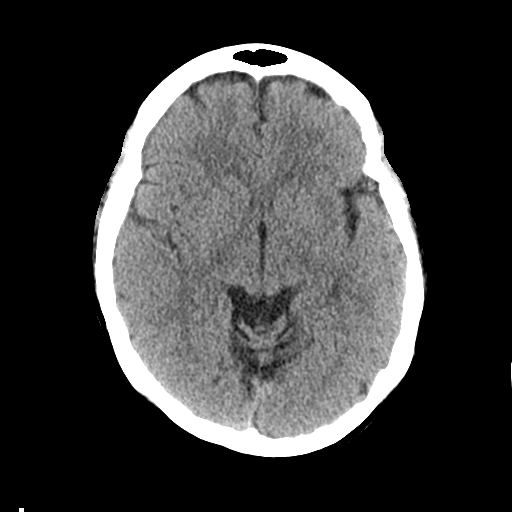
[im 17/33  brain]
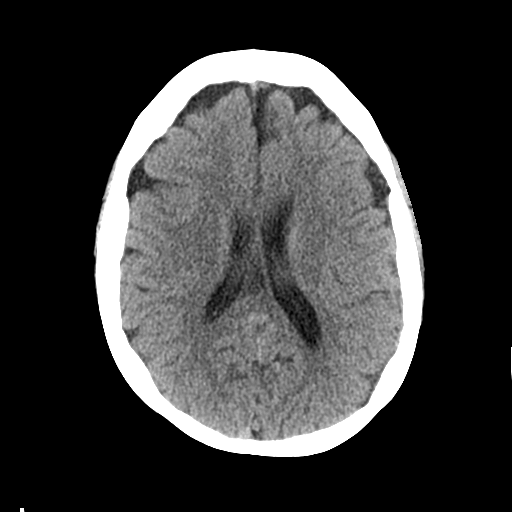
[im 21/33  brain]
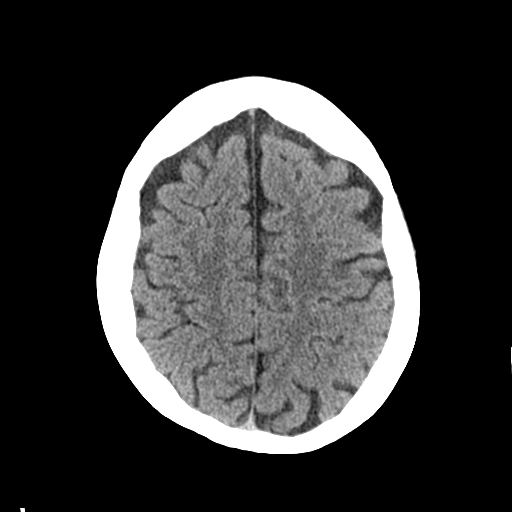
[im 21/33  bone]
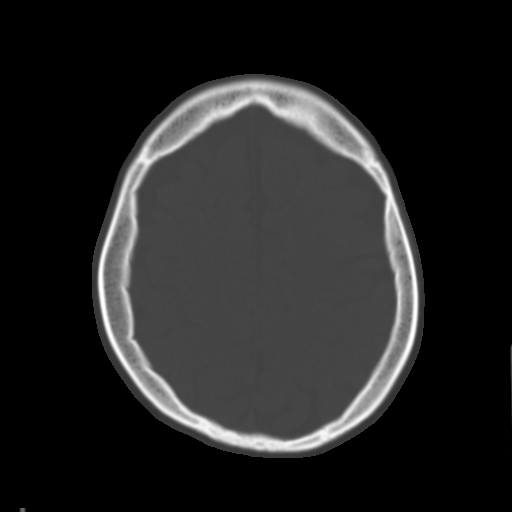
[im 25/33  brain]
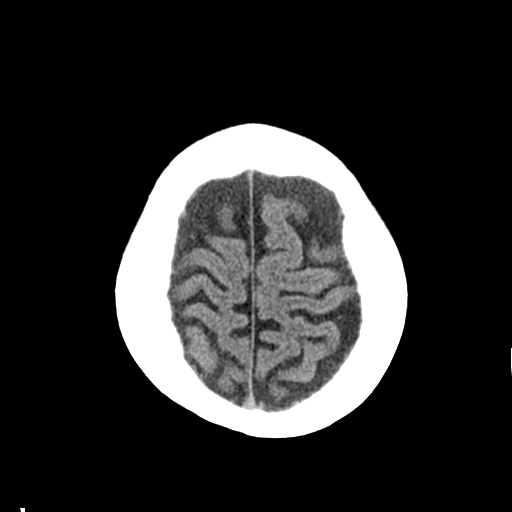
[im 29/33  brain]
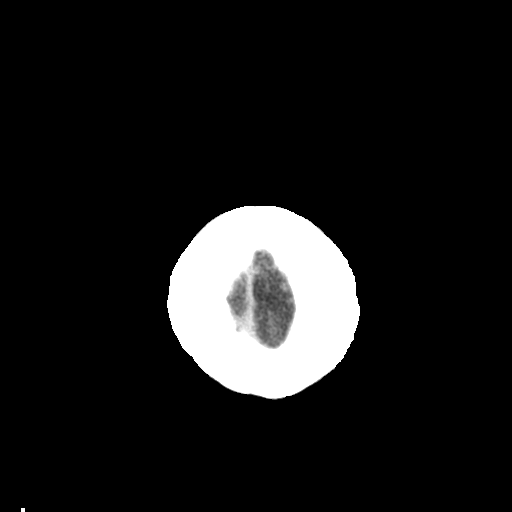

[Series 4: head bone · axial · 0.46mm/px · z∈[-552,-496]mm · 4 of 81 slices shown]
[im 9/81  bone]
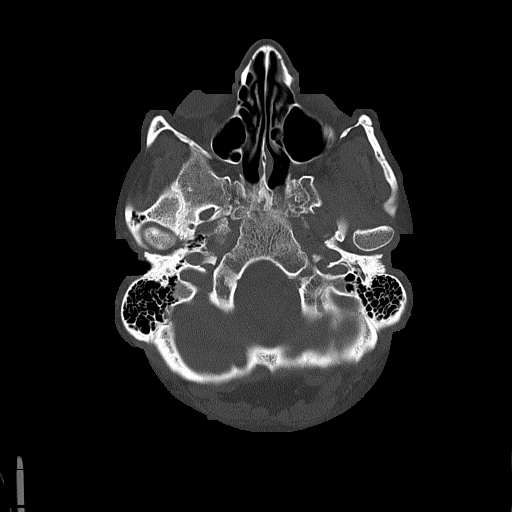
[im 17/81  bone]
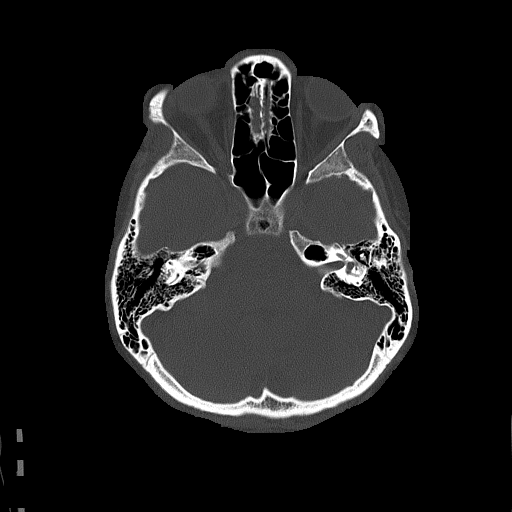
[im 25/81  bone]
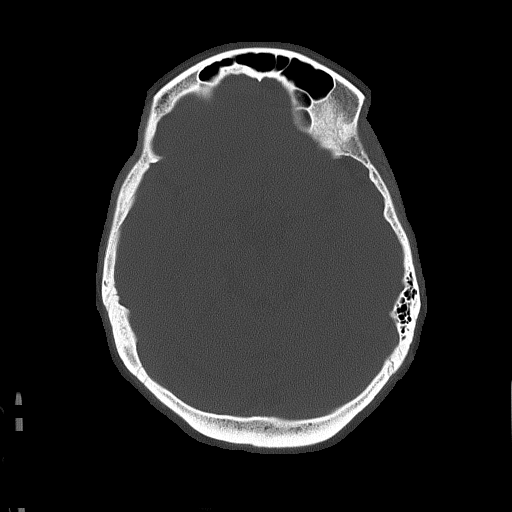
[im 37/81  bone]
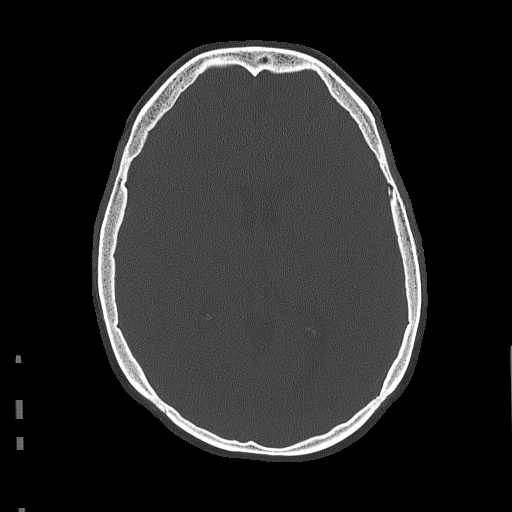

[Series 5: coronal soft · coronal · 0.35mm/px · 3 of 71 slices shown]
[im 24/71  brain]
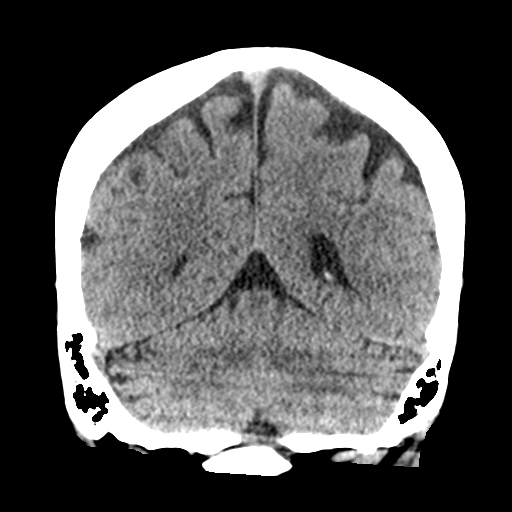
[im 32/71  brain]
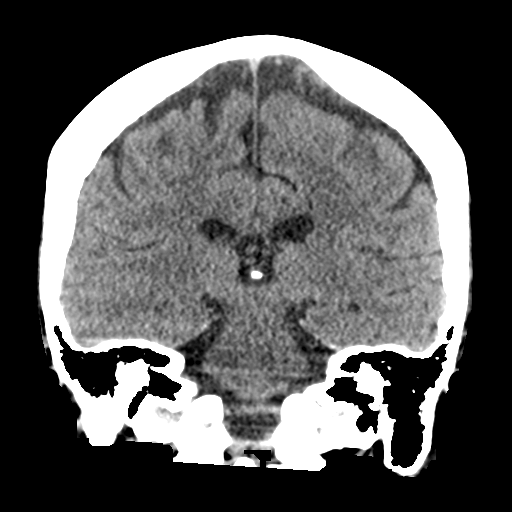
[im 39/71  brain]
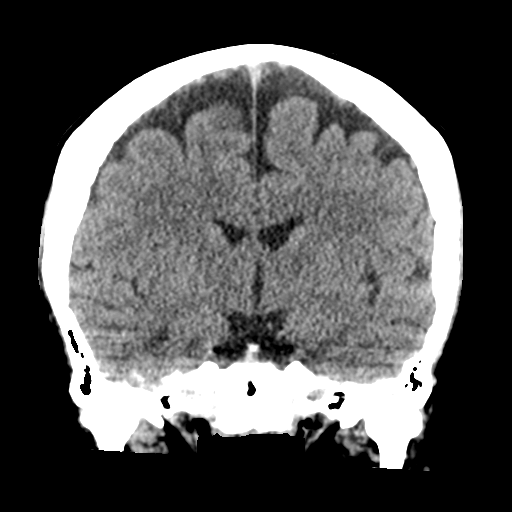

[Series 6: sagittal soft · sagittal · 0.35mm/px · 3 of 60 slices shown]
[im 20/60  brain]
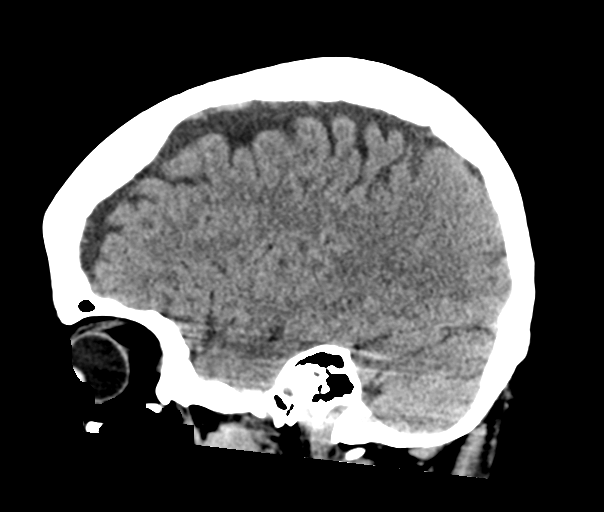
[im 30/60  brain]
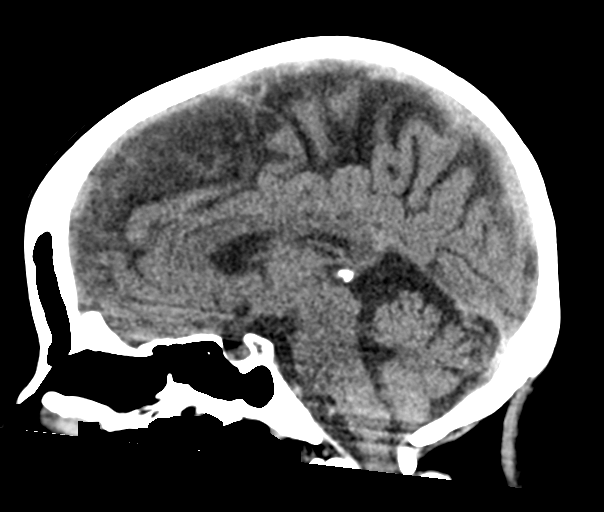
[im 40/60  brain]
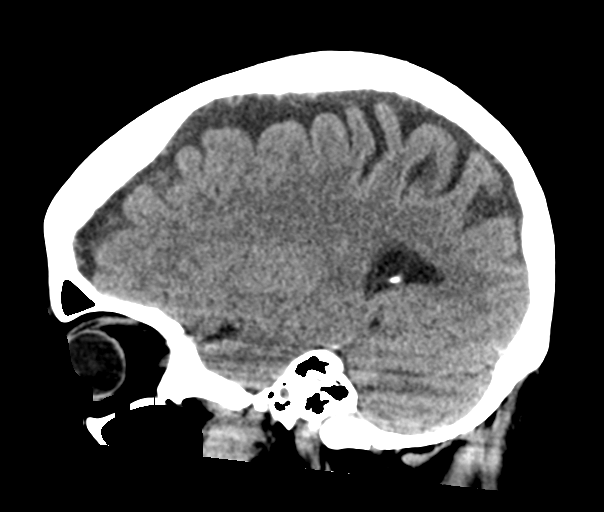

[17 of 47 positions shown; findings below may reference images not displayed]

FINDINGS: Brain: No acute infarct or hemorrhage. Lateral ventricles and
midline structures are unremarkable. No acute extra-axial fluid
collections. No mass effect.

Vascular: No hyperdense vessel or unexpected calcification.

Skull: Normal. Negative for fracture or focal lesion.

Sinuses/Orbits: No acute finding.

Other: None.
IMPRESSION: 1. No acute intracranial process.

## 2023-01-31 ENCOUNTER — Encounter (HOSPITAL_COMMUNITY): Payer: Self-pay

## 2023-01-31 ENCOUNTER — Other Ambulatory Visit (HOSPITAL_COMMUNITY): Payer: Self-pay | Admitting: Psychiatry

## 2023-01-31 MED ORDER — AMPHETAMINE-DEXTROAMPHET ER 30 MG PO CP24: 30.0000 mg | ORAL_CAPSULE | ORAL | 0 refills | Status: DC

## 2023-02-02 ENCOUNTER — Encounter: Payer: Self-pay | Admitting: Radiology

## 2023-03-07 ENCOUNTER — Telehealth (HOSPITAL_COMMUNITY): Payer: Self-pay

## 2023-03-07 NOTE — Telephone Encounter (Signed)
Pt called in stating that her pharmacy is out of her adderall and is wanting to know what to do advised pt to check with other local pharmacies to see if they have any. Pt agreed she will contact them and let us know

## 2023-03-31 ENCOUNTER — Telehealth (INDEPENDENT_AMBULATORY_CARE_PROVIDER_SITE_OTHER): Payer: Medicaid Other | Admitting: Psychiatry

## 2023-03-31 ENCOUNTER — Encounter (HOSPITAL_COMMUNITY): Payer: Self-pay | Admitting: Psychiatry

## 2023-03-31 DIAGNOSIS — F331 Major depressive disorder, recurrent, moderate: Secondary | ICD-10-CM

## 2023-03-31 DIAGNOSIS — F900 Attention-deficit hyperactivity disorder, predominantly inattentive type: Secondary | ICD-10-CM | POA: Diagnosis not present

## 2023-03-31 MED ORDER — AMPHETAMINE-DEXTROAMPHETAMINE 20 MG PO TABS: 20.0000 mg | ORAL_TABLET | Freq: Two times a day (BID) | ORAL | 0 refills | Status: DC

## 2023-03-31 MED ORDER — QUETIAPINE FUMARATE 100 MG PO TABS: 100.0000 mg | ORAL_TABLET | Freq: Two times a day (BID) | ORAL | 2 refills | Status: DC

## 2023-03-31 MED ORDER — FLUOXETINE HCL 40 MG PO CAPS: ORAL_CAPSULE | ORAL | 2 refills | Status: DC

## 2023-03-31 MED ORDER — ALPRAZOLAM 1 MG PO TABS: 1.0000 mg | ORAL_TABLET | Freq: Two times a day (BID) | ORAL | 2 refills | Status: DC | PRN

## 2023-03-31 NOTE — Progress Notes (Signed)
Virtual Visit via Video Note  I connected with Sandra Glover on 03/31/23 at 11:20 AM EDT by a video enabled telemedicine application and verified that I am speaking with the correct person using two identifiers.  Location: Patient: home Provider: office   I discussed the limitations of evaluation and management by telemedicine and the availability of in person appointments. The patient expressed understanding and agreed to proceed.      I discussed the assessment and treatment plan with the patient. The patient was provided an opportunity to ask questions and all were answered. The patient agreed with the plan and demonstrated an understanding of the instructions.   The patient was advised to call back or seek an in-person evaluation if the symptoms worsen or if the condition fails to improve as anticipated.  I provided 15 minutes of non-face-to-face time during this encounter.   [name], MD  [facility] MD/PA/NP OP Progress Note  03/31/2023 11:31 AM Sandra Glover  MRN:  [id number]  Chief Complaint:  Chief Complaint  Patient presents with   Depression   Anxiety   ADD   Follow-up   HPI: This patient is a 37 year old married white female who lives with her husband and 2 children in [place]. 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   The patient returns for follow-up after 3 months.  For the most part she is doing well.  She is staying busy working and also studying for a degree in business administration.  Unfortunately because of the national shortage she has not been able to get the Adderall XR.  She is struggling to focus without it.  We will try to change it to regular Adderall and see if we have any better luck.  In terms of mood she is doing well and denies significant depression anxiety thoughts of self-harm manic symptoms such as racing thoughts or difficulty sleeping.  She denies suicidal ideation Visit Diagnosis:    ICD-10-CM   1. Major  depressive disorder, recurrent episode, moderate (HCC)  F33.1     2. Attention deficit hyperactivity disorder (ADHD), predominantly inattentive type  F90.0       Past Psychiatric History: Long-term outpatient treatment  Past Medical History:  Past Medical History:  Diagnosis Date   Anxiety    Anxiety    Arthritis    Depression    History of anemia 2013   History of depression    states stopped med. 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   Hypertension    Osteopenia    Pertussis    Postcoital bleeding 07/14/2015   Sinus infection 01/25/2013   started antibiotic 01/24/2013 x 10 days; nasal congestion, clear drainage from nose   Tonsillar and adenoid hypertrophy 01/2013   Vaginal irritation 03/25/2014   Mild yeast but has history of yeast, will not treat til after first trimester,try yogurt    Past Surgical History:  Procedure Laterality Date   CHOLECYSTECTOMY  04/23/2010   laparoscopic   COLONOSCOPY WITH PROPOFOL N/A 05/17/2016   Procedure: COLONOSCOPY WITH PROPOFOL;  Surgeon: West Bali, MD;  [facility];  Service: Endoscopy;  Laterality: N/A;  1000   HEMORRHOID BANDING N/A 05/17/2016   Procedure: HEMORRHOID BANDING;  Surgeon: [place] Bali, MD;  [facility];  Service: Endoscopy;  Laterality: N/A;   HEMORRHOID SURGERY N/A 06/08/2016   Procedure: SIMPLE HEMORRHOIDECTOMY;  Surgeon: Barbara Cower  Isaac Laud, MD;  Location[facility];  Service: General;  Laterality: N/A;   PARTIAL NEPHRECTOMY Right age 17 mos.   TONSILLECTOMY AND ADENOIDECTOMY N/A 01/29/2013   Procedure: TONSILLECTOMY AND ADENOIDECTOMY;  Surgeon: Darletta Moll, MD;  Location: [facility];  Service: ENT;  Laterality: N/A;    Family Psychiatric History: See below  Family History:  Family History  Problem Relation Age of Onset   COPD Mother    Depression Mother    Anxiety disorder Mother    Hypertension Mother    Irritable bowel  syndrome Mother    Arthritis Mother    Skin cancer Mother    Thyroid cancer Mother    Ovarian cancer Maternal Aunt    Ovarian cancer Maternal Grandmother    Cancer Maternal Grandmother        bladder, kidney   Hypertension Father    GER disease Father    Bipolar disorder Paternal Uncle    Anxiety disorder Cousin    Drug abuse Cousin    Bipolar disorder Cousin    Colon cancer Neg Hx     Social History:  Social History   Socioeconomic History   Marital status: Married    Spouse name: Not on file   Number of children: Not on file   Years of education: Not on file   Highest education level: Not on file  Occupational History   Not on file  Tobacco Use   Smoking status: Never    Passive exposure: Never   Smokeless tobacco: Never   Tobacco comments:    Never smoked  Vaping Use   Vaping Use: Never used  Substance and Sexual Activity   Alcohol use: Yes    Alcohol/week: 0.0 standard drinks of alcohol    Comment: socially   Drug use: No   Sexual activity: Yes    Birth control/protection: Implant    Comment: nexplanon implant  Other Topics Concern   Not on file  Social History Narrative   Not on file   Social Determinants of Health   Financial Resource Strain: Not on file  Food Insecurity: Not on file  Transportation Needs: Not on file  Physical Activity: Not on file  Stress: Not on file  Social Connections: Not on file    Allergies:  Allergies  Allergen Reactions   Lortab [Hydrocodone-Acetaminophen] Nausea And Vomiting   Morphine And Related Rash    Metabolic Disorder Labs: No results found for: "HGBA1C", "MPG" No results found for: "PROLACTIN" No results found for: "CHOL", "TRIG", "HDL", "CHOLHDL", "VLDL", "LDLCALC" Lab Results  Component Value Date   TSH 2.553 10/13/2017    Therapeutic Level Labs: No results found for: "LITHIUM" No results found for: "VALPROATE" No results found for: "CBMZ"  Current Medications: Current Outpatient Medications   Medication Sig Dispense Refill   amphetamine-dextroamphetamine (ADDERALL) 20 MG tablet Take 1 tablet (20 mg total) by mouth 2 (two) times daily. 60 tablet 0   amphetamine-dextroamphetamine (ADDERALL) 20 MG tablet Take 1 tablet (20 mg total) by mouth 2 (two) times daily. Fill after 04/29/2023 60 tablet 0   amphetamine-dextroamphetamine (ADDERALL) 20 MG tablet Take 1 tablet (20 mg total) by mouth 2 (two) times daily. 60 tablet 0   calcium-vitamin D (OSCAL WITH D) 500-200 MG-UNIT tablet Take 1 tablet by mouth.     dicyclomine (BENTYL) 10 MG capsule Take 1 capsule (10 mg total) by mouth 4 (four) times daily -  before meals and at bedtime. 90 capsule 0  Drospirenone-Estetrol 3-14.2 MG TABS Take 1 tablet by mouth daily. 28 tablet 12   etonogestrel (NEXPLANON) 68 MG IMPL implant 1 each by Subdermal route once.     FLUoxetine (PROZAC) 40 MG capsule TAKE 1 CAPSULE BY MOUTH EVERY DAY 30 capsule 2   ibuprofen (ADVIL) 600 MG tablet Take 1 tablet (600 mg total) by mouth every 6 (six) hours as needed. 30 tablet 0   lisinopril-hydrochlorothiazide (ZESTORETIC) 20-12.5 MG tablet Take 1 tablet by mouth daily.     meloxicam (MOBIC) 7.5 MG tablet Take 7.5 mg by mouth daily as needed for pain.     pantoprazole (PROTONIX) 40 MG tablet TAKE 1 TABLET(40 MG) BY MOUTH TWICE DAILY 180 tablet 3   polyethylene glycol powder (GLYCOLAX/MIRALAX) 17 GM/SCOOP powder 1-4 scoop daily or as needed 255 g 11   QUEtiapine (SEROQUEL) 100 MG tablet Take 1 tablet (100 mg total) by mouth 2 (two) times daily. 60 tablet 2   TRULANCE 3 MG TABS TAKE 1 TABLET BY MOUTH DAILY 30 tablet 3   No current facility-administered medications for this visit.     Musculoskeletal: Strength & Muscle Tone: within normal limits Gait & Station: normal Patient leans: N/A  Psychiatric Specialty Exam: Review of Systems  Psychiatric/Behavioral:  Positive for decreased concentration.   All other systems reviewed and are negative.   There were no vitals  taken for this visit.There is no height or weight on file to calculate BMI.  General Appearance: Casual and Fairly Groomed  Eye Contact:  Good  Speech:  Clear and Coherent  Volume:  Normal  Mood:  Euthymic  Affect:  Congruent  Thought Process:  Goal Directed  Orientation:  Full (Time, Place, and Person)  Thought Content: WDL   Suicidal Thoughts:  No  Homicidal Thoughts:  No  Memory:  Immediate;   Good Recent;   Good Remote;   Good  Judgement:  Good  Insight:  Good  Psychomotor Activity:  Normal  Concentration:  Concentration: Poor and Attention Span: Poor  Recall:  Good  Fund of Knowledge: Good  Language: Good  Akathisia:  No  Handed:  Right  AIMS (if indicated): not done  Assets:  Communication Skills Desire for Improvement Physical Health Resilience Social Support  ADL's:  Intact  Cognition: WNL  Sleep:  Good   Screenings: GAD-7    Flowsheet Row Counselor from 09/27/2018 in [facility] [place]  Total GAD-7 Score 18      PHQ2-9    Flowsheet Row Video Visit from 07/13/2022 in [facility] [place] Video Visit from 03/23/2022 in [facility] [place] Video Visit from 12/10/2021 in [facility] at [place] Video Visit from 11/12/2021 in [facility] at [place] Video Visit from 09/23/2021 in [facility] Total Score 0 0 0 2 1  PHQ-9 Total Score -- -- -- 8 4      Flowsheet Row ED from 01/13/2023 in [facility] ED from 12/14/2022 in [facility] Emergency Department at [facility] ED from 10/09/2022 in [facility] at [place]  C-SSRS RISK CATEGORY No Risk No Risk No Risk        Assessment and Plan: This patient is a 37 year old female with a history of depression anxiety ADD and possible bipolar 2 disorder.  She has had  trouble getting Adderall XR 30 mg so we will  switch it to Adderall 20 mg twice daily.  She will continue Seroquel 100 mg twice daily for mood stabilization Prozac 40 mg daily for depression and Xanax 1 mg twice daily as needed for anxiety.  She will return to see me  Collaboration of Care: Collaboration of Care: Primary Care Provider AEB notes will be shared with PCP at patient's request  Patient/Guardian was advised Release of Information must be obtained prior to any record release in order to collaborate their care with an outside provider. Patient/Guardian was advised if they have not already done so to contact the registration department to sign all necessary forms in order for [place] to release information regarding their care.   Consent: Patient/Guardian gives verbal consent for treatment and assignment of benefits for services provided during this visit. Patient/Guardian expressed understanding and agreed to proceed.    [name], MD 03/31/2023, 11:31 AM

## 2023-04-25 ENCOUNTER — Encounter: Payer: Self-pay | Admitting: Family Medicine

## 2023-04-25 ENCOUNTER — Ambulatory Visit (INDEPENDENT_AMBULATORY_CARE_PROVIDER_SITE_OTHER): Payer: Medicaid Other | Admitting: Family Medicine

## 2023-04-25 VITALS — BP 118/82 | HR 98 | Ht 63.0 in | Wt 197.1 lb

## 2023-04-25 DIAGNOSIS — E0789 Other specified disorders of thyroid: Secondary | ICD-10-CM

## 2023-04-25 DIAGNOSIS — I10 Essential (primary) hypertension: Secondary | ICD-10-CM | POA: Diagnosis not present

## 2023-04-25 DIAGNOSIS — R7301 Impaired fasting glucose: Secondary | ICD-10-CM | POA: Diagnosis not present

## 2023-04-25 DIAGNOSIS — E559 Vitamin D deficiency, unspecified: Secondary | ICD-10-CM

## 2023-04-25 DIAGNOSIS — F909 Attention-deficit hyperactivity disorder, unspecified type: Secondary | ICD-10-CM | POA: Insufficient documentation

## 2023-04-25 DIAGNOSIS — M542 Cervicalgia: Secondary | ICD-10-CM | POA: Insufficient documentation

## 2023-04-25 DIAGNOSIS — Z862 Personal history of diseases of the blood and blood-forming organs and certain disorders involving the immune mechanism: Secondary | ICD-10-CM | POA: Insufficient documentation

## 2023-04-25 DIAGNOSIS — Z1159 Encounter for screening for other viral diseases: Secondary | ICD-10-CM

## 2023-04-25 DIAGNOSIS — E7849 Other hyperlipidemia: Secondary | ICD-10-CM

## 2023-04-25 DIAGNOSIS — Z8632 Personal history of gestational diabetes: Secondary | ICD-10-CM

## 2023-04-25 MED ORDER — LISINOPRIL-HYDROCHLOROTHIAZIDE 20-12.5 MG PO TABS
1.0000 | ORAL_TABLET | Freq: Every day | ORAL | 0 refills | Status: DC
Start: 2023-04-25 — End: 2023-08-04

## 2023-04-25 NOTE — Progress Notes (Signed)
New Patient Office Visit  Subjective:  Patient ID: Sandra Glover, female    DOB: 01-Dec-1986  Age: 37 y.o. MRN: [id number]  CC:  Chief Complaint  Patient presents with   New Patient (Initial Visit)    Establishing care.     HPI Sandra Glover is a 37 y.o. female with past medical history of primary hypertension, adult ADHD, bipolar 1, neck pain, major depressive disorder, anxiety, and osteoarthritis presents for establishing care. For the details of today's visit, please refer to the assessment and plan.     Past Medical History:  Diagnosis Date   Anxiety    Anxiety    Arthritis    Depression    History of anemia 2013   History of depression    states stopped med. 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   right - due to kidney infection   Hypertension    Osteopenia    Pertussis    Postcoital bleeding 07/14/2015   Sinus infection 01/25/2013   started antibiotic 01/24/2013 x 10 days; nasal congestion, clear drainage from nose   Tonsillar and adenoid hypertrophy 01/2013   Vaginal irritation 03/25/2014   Mild yeast but has history of yeast, will not treat til after first trimester,try yogurt    Past Surgical History:  Procedure Laterality Date   CHOLECYSTECTOMY  04/23/2010   laparoscopic   COLONOSCOPY WITH PROPOFOL N/A 05/17/2016   Procedure: COLONOSCOPY WITH PROPOFOL;  Surgeon: West Bali, MD;  Location: [facility];  Service: Endoscopy;  Laterality: N/A;  1000   HEMORRHOID BANDING N/A 05/17/2016   Procedure: HEMORRHOID BANDING;  Surgeon: West Bali, MD;  Location: [facility];  Service: Endoscopy;  Laterality: N/A;   HEMORRHOID SURGERY N/A 06/08/2016   Procedure: SIMPLE HEMORRHOIDECTOMY;  Surgeon: Ancil Linsey, MD;  Location: [facility];  Service: General;  Laterality: N/A;   PARTIAL NEPHRECTOMY Right age 60 mos.   TONSILLECTOMY AND ADENOIDECTOMY N/A 01/29/2013   Procedure: TONSILLECTOMY AND ADENOIDECTOMY;  Surgeon: Darletta Moll, MD;   Location: [facility];  Service: ENT;  Laterality: N/A;    Family History  Problem Relation Age of Onset   COPD Mother    Depression Mother    Anxiety disorder Mother    Hypertension Mother    Irritable bowel syndrome Mother    Arthritis Mother    Skin cancer Mother    Thyroid cancer Mother    Ovarian cancer Maternal Aunt    Ovarian cancer Maternal Grandmother    Cancer Maternal Grandmother        bladder, kidney   Hypertension Father    GER disease Father    Bipolar disorder Paternal Uncle    Anxiety disorder Cousin    Drug abuse Cousin    Bipolar disorder Cousin    Colon cancer Neg Hx     Social History   Socioeconomic History   Marital status: Married    Spouse name: Not on file   Number of children: Not on file   Years of education: Not on file   Highest education level: Not on file  Occupational History   Not on file  Tobacco Use   Smoking status: Never    Passive exposure: Never   Smokeless tobacco: Never   Tobacco comments:    Never smoked  Vaping Use   Vaping Use: Never used  Substance and Sexual Activity   Alcohol use: Yes    Alcohol/week: 0.0 standard  drinks of alcohol    Comment: socially   Drug use: No   Sexual activity: Yes    Birth control/protection: Implant    Comment: nexplanon implant  Other Topics Concern   Not on file  Social History Narrative   Not on file   Social Determinants of Health   Financial Resource Strain: Not on file  Food Insecurity: Not on file  Transportation Needs: Not on file  Physical Activity: Not on file  Stress: Not on file  Social Connections: Not on file  Intimate Partner Violence: Not on file    ROS Review of Systems  Constitutional:  Negative for chills and fever.  Eyes:  Negative for visual disturbance.  Respiratory:  Negative for chest tightness and shortness of breath.   Neurological:  Negative for dizziness and headaches.    Objective:   Today's Vitals: BP 118/82   Pulse 98    Ht 5\' 3"  (1.6 m)   Wt 197 lb 1.9 oz (89.4 kg)   SpO2 98%   BMI 34.92 kg/m   Physical Exam HENT:     Head: Normocephalic.     Mouth/Throat:     Mouth: Mucous membranes are moist.  Cardiovascular:     Rate and Rhythm: Normal rate.     Heart sounds: Normal heart sounds.  Pulmonary:     Effort: Pulmonary effort is normal.     Breath sounds: Normal breath sounds.  Neurological:     Mental Status: She is alert.      Assessment & Plan:   Primary hypertension Assessment & Plan: Controlled Requested a refill of lisinopril hydrochlorothiazide 20- 12.5 mg daily Asymptomatic Low-sodium diet with increased physical activity encouraged BP Readings from Last 3 Encounters:  04/25/23 118/82  01/13/23 121/82  12/14/22 (!) 109/54      Vitamin D deficiency -     VITAMIN D 25 Hydroxy (Vit-D Deficiency, Fractures) -     Lisinopril-hydroCHLOROthiazide; Take 1 tablet by mouth daily.  Dispense: 90 tablet; Refill: 0  Impaired fasting blood sugar -     Hemoglobin A1c  Other hyperlipidemia -     CBC with Differential/Platelet -     CMP14+EGFR -     Lipid panel  Other specified disorders of thyroid -     TSH + free T4  Encounter for hepatitis C screening test for low risk patient -     Hepatitis C antibody  History of gestational diabetes -     Microalbumin / creatinine urine ratio     Follow-up: Return in about 3 months (around 07/26/2023).   Sandra Laroche, FNP

## 2023-04-25 NOTE — Assessment & Plan Note (Addendum)
Controlled Requested a refill of lisinopril hydrochlorothiazide 20- 12.5 mg daily Asymptomatic Low-sodium diet with increased physical activity encouraged BP Readings from Last 3 Encounters:  04/25/23 118/82  01/13/23 121/82  12/14/22 (!) 109/54

## 2023-04-25 NOTE — Patient Instructions (Addendum)
I appreciate the opportunity to provide care to you today!    Follow up:  3 months  Labs: please stop by the lab today to get your blood drawn (CBC, CMP, TSH, Lipid profile, HgA1c, Vit D)  Screening: Hep C   Please continue to a heart-healthy diet and increase your physical activities. Try to exercise for at least five days a week.      It was a pleasure to see you and I look forward to continuing to work together on your health and well-being. Please do not hesitate to call the office if you need care or have questions about your care.   Have a wonderful day and week. With Gratitude, Gilmore Laroche MSN, FNP-BC

## 2023-04-26 LAB — CMP14+EGFR
AST: 20 IU/L (ref 0–40)
Albumin/Globulin Ratio: 2.5 — ABNORMAL HIGH (ref 1.2–2.2)
Alkaline Phosphatase: 124 IU/L — ABNORMAL HIGH (ref 44–121)
Creatinine, Ser: 0.91 mg/dL (ref 0.57–1.00)
Potassium: 4.1 mmol/L (ref 3.5–5.2)
Total Protein: 6.6 g/dL (ref 6.0–8.5)
eGFR: 85 mL/min/{1.73_m2} (ref >59)

## 2023-04-26 LAB — LIPID PANEL
Cholesterol, Total: 193 mg/dL (ref 100–199)
VLDL Cholesterol Cal: 26 mg/dL (ref 5–40)

## 2023-04-26 LAB — MICROALBUMIN / CREATININE URINE RATIO

## 2023-04-26 LAB — HEMOGLOBIN A1C: Hgb A1c MFr Bld: 5.4 % (ref 4.8–5.6)

## 2023-04-26 LAB — CBC WITH DIFFERENTIAL/PLATELET
Basos: 0 %
Hematocrit: 37.6 % (ref 34.0–46.6)
Hemoglobin: 12.9 g/dL (ref 11.1–15.9)
MCH: 32.2 pg (ref 26.6–33.0)
Neutrophils Absolute: 5.6 10*3/uL (ref 1.4–7.0)

## 2023-04-26 LAB — TSH+FREE T4: Free T4: 1.03 ng/dL (ref 0.82–1.77)

## 2023-04-27 LAB — CMP14+EGFR
ALT: 14 IU/L (ref 0–32)
Albumin: 4.7 g/dL (ref 3.9–4.9)
BUN/Creatinine Ratio: 10 (ref 9–23)
BUN: 9 mg/dL (ref 6–20)
Bilirubin Total: <0.2 mg/dL (ref 0.0–1.2)
CO2: 21 mmol/L (ref 20–29)
Calcium: 9.8 mg/dL (ref 8.7–10.2)
Chloride: 103 mmol/L (ref 96–106)
Globulin, Total: 1.9 g/dL (ref 1.5–4.5)
Glucose: 88 mg/dL (ref 70–99)
Sodium: 140 mmol/L (ref 134–144)

## 2023-04-27 LAB — CBC WITH DIFFERENTIAL/PLATELET
Basophils Absolute: 0 10*3/uL (ref 0.0–0.2)
EOS (ABSOLUTE): 0.2 10*3/uL (ref 0.0–0.4)
Eos: 3 %
Immature Grans (Abs): 0 10*3/uL (ref 0.0–0.1)
Immature Granulocytes: 0 %
Lymphocytes Absolute: 1.7 10*3/uL (ref 0.7–3.1)
Lymphs: 22 %
MCHC: 34.3 g/dL (ref 31.5–35.7)
MCV: 94 fL (ref 79–97)
Monocytes Absolute: 0.5 10*3/uL (ref 0.1–0.9)
Monocytes: 6 %
Neutrophils: 69 %
Platelets: 252 10*3/uL (ref 150–450)
RBC: 4.01 x10E6/uL (ref 3.77–5.28)
RDW: 11.6 % — ABNORMAL LOW (ref 11.7–15.4)
WBC: 8.1 10*3/uL (ref 3.4–10.8)

## 2023-04-27 LAB — LIPID PANEL
Chol/HDL Ratio: 3.2 ratio (ref 0.0–4.4)
HDL: 61 mg/dL (ref >39)
LDL Chol Calc (NIH): 106 mg/dL — ABNORMAL HIGH (ref 0–99)
Triglycerides: 149 mg/dL (ref 0–149)

## 2023-04-27 LAB — HEMOGLOBIN A1C: Est. average glucose Bld gHb Est-mCnc: 108 mg/dL

## 2023-04-27 LAB — VITAMIN D 25 HYDROXY (VIT D DEFICIENCY, FRACTURES): Vit D, 25-Hydroxy: 25.9 ng/mL — ABNORMAL LOW (ref 30.0–100.0)

## 2023-04-27 LAB — MICROALBUMIN / CREATININE URINE RATIO: Creatinine, Urine: 23.8 mg/dL

## 2023-04-27 LAB — TSH+FREE T4: TSH: 1.17 u[IU]/mL (ref 0.450–4.500)

## 2023-04-27 LAB — HEPATITIS C ANTIBODY: Hep C Virus Ab: NONREACTIVE

## 2023-04-28 NOTE — Progress Notes (Signed)
Please inform the patient that her vitamin D is slightly low.  I recommend taking over-the-counter vitamin D at 1000 IU daily.  LDL cholesterol is slightly elevated.  I want her LDL cholesterol to be less than 100.  Please encouraged the patient to avoid greasy, fatty, starchy foods and to increase her physical activity.

## 2023-06-13 ENCOUNTER — Encounter (HOSPITAL_COMMUNITY): Payer: Self-pay

## 2023-06-22 ENCOUNTER — Encounter: Payer: Self-pay | Admitting: Family Medicine

## 2023-07-05 ENCOUNTER — Other Ambulatory Visit: Payer: Self-pay

## 2023-07-05 ENCOUNTER — Emergency Department (HOSPITAL_COMMUNITY): Payer: Medicaid Other

## 2023-07-05 ENCOUNTER — Emergency Department (HOSPITAL_COMMUNITY)
Admission: EM | Admit: 2023-07-05 | Discharge: 2023-07-05 | Disposition: A | Discharge status: Home / Self Care | Payer: Medicaid Other | Attending: Emergency Medicine | Admitting: Emergency Medicine

## 2023-07-05 ENCOUNTER — Encounter (HOSPITAL_COMMUNITY): Payer: Self-pay

## 2023-07-05 DIAGNOSIS — K623 Rectal prolapse: Secondary | ICD-10-CM | POA: Diagnosis not present

## 2023-07-05 DIAGNOSIS — Z79899 Other long term (current) drug therapy: Secondary | ICD-10-CM | POA: Insufficient documentation

## 2023-07-05 DIAGNOSIS — R102 Pelvic and perineal pain: Secondary | ICD-10-CM | POA: Insufficient documentation

## 2023-07-05 DIAGNOSIS — I10 Essential (primary) hypertension: Secondary | ICD-10-CM | POA: Insufficient documentation

## 2023-07-05 LAB — COMPREHENSIVE METABOLIC PANEL
ALT: 20 U/L (ref 0–44)
AST: 19 U/L (ref 15–41)
Albumin: 4.1 g/dL (ref 3.5–5.0)
Alkaline Phosphatase: 110 U/L (ref 38–126)
Anion gap: 11 (ref 5–15)
BUN: 9 mg/dL (ref 6–20)
CO2: 21 mmol/L — ABNORMAL LOW (ref 22–32)
Calcium: 9 mg/dL (ref 8.9–10.3)
Chloride: 101 mmol/L (ref 98–111)
Creatinine, Ser: 0.87 mg/dL (ref 0.44–1.00)
GFR, Estimated: >60 mL/min (ref >60)
Glucose, Bld: 114 mg/dL — ABNORMAL HIGH (ref 70–99)
Potassium: 3.4 mmol/L — ABNORMAL LOW (ref 3.5–5.1)
Sodium: 133 mmol/L — ABNORMAL LOW (ref 135–145)
Total Bilirubin: 0.7 mg/dL (ref 0.3–1.2)
Total Protein: 7 g/dL (ref 6.5–8.1)

## 2023-07-05 LAB — LIPASE, BLOOD: Lipase: 33 U/L (ref 11–51)

## 2023-07-05 LAB — URINALYSIS, ROUTINE W REFLEX MICROSCOPIC
Bilirubin Urine: NEGATIVE
Glucose, UA: NEGATIVE mg/dL
Hgb urine dipstick: NEGATIVE
Ketones, ur: NEGATIVE mg/dL
Leukocytes,Ua: NEGATIVE
Nitrite: NEGATIVE
Protein, ur: NEGATIVE mg/dL
Specific Gravity, Urine: 1.012 (ref 1.005–1.030)
pH: 6 (ref 5.0–8.0)

## 2023-07-05 LAB — CBC
HCT: 37.9 % (ref 36.0–46.0)
Hemoglobin: 13.4 g/dL (ref 12.0–15.0)
MCH: 33.4 pg (ref 26.0–34.0)
MCHC: 35.4 g/dL (ref 30.0–36.0)
MCV: 94.5 fL (ref 80.0–100.0)
Platelets: 253 10*3/uL (ref 150–400)
RBC: 4.01 MIL/uL (ref 3.87–5.11)
RDW: 12.4 % (ref 11.5–15.5)
WBC: 11.9 10*3/uL — ABNORMAL HIGH (ref 4.0–10.5)
nRBC: 0 % (ref 0.0–0.2)

## 2023-07-05 LAB — POC URINE PREG, ED: Preg Test, Ur: NEGATIVE

## 2023-07-05 MED ORDER — IOHEXOL 300 MG/ML  SOLN
100.0000 mL | Freq: Once | INTRAMUSCULAR | Status: AC | PRN
  Administered 2023-07-05: 100 mL via INTRAVENOUS

## 2023-07-05 MED ORDER — OXYCODONE-ACETAMINOPHEN 5-325 MG PO TABS: 1.0000 | ORAL_TABLET | Freq: Three times a day (TID) | ORAL | 0 refills | Status: DC | PRN

## 2023-07-05 MED ORDER — FENTANYL CITRATE PF 50 MCG/ML IJ SOSY
50.0000 ug | PREFILLED_SYRINGE | Freq: Once | INTRAMUSCULAR | Status: AC
  Administered 2023-07-05: 50 ug via INTRAVENOUS
  Filled 2023-07-05: qty 1

## 2023-07-05 MED ORDER — ONDANSETRON HCL 4 MG/2ML IJ SOLN
4.0000 mg | Freq: Once | INTRAMUSCULAR | Status: AC
  Administered 2023-07-05: 4 mg via INTRAVENOUS
  Filled 2023-07-05: qty 2

## 2023-07-05 NOTE — ED Provider Notes (Signed)
[place] EM[facility] DEPARTMENT AT [facility] Provider Note   CSN: [id number] Arrival date & time: 07/05/23  1541     History  Chief Complaint  Patient presents with   Abdominal Pain    Sandra Glover is a 37 y.o. female with history including hypertension, arthritis, known history of rectocele and cystocele, suspected complications of her last pregnancy presenting with low pelvic pain and worsening prolapse.  She describes severe pain in her vaginal area with attempts to have a bowel movement and describes a pressure and a popping sensation with Valsalva in which she envisions her rectum trying to push through her vagina.  With Valsalva and sometimes simply with standing she prolapses externally.  She is having difficulty with bowel movements, having to splint internally sometimes to pass stool. She also has a  history of IBS constipation predominant.  She denies fevers or chills, nausea or vomiting.  She is under the care of Dr. Despina Hidden for this problem and knows will eventually need surgical repair.   The history is provided by the patient.       Home Medications Prior to Admission medications   Medication Sig Start Date End Date Taking? Authorizing Provider  ALPRAZolam Prudy Feeler) 1 MG tablet Take 1 tablet (1 mg total) by mouth 2 (two) times daily as needed for anxiety. 03/31/23 03/30/24  Myrlene Broker, MD  amphetamine-dextroamphetamine (ADDERALL) 20 MG tablet Take 1 tablet (20 mg total) by mouth 2 (two) times daily. 03/31/23 03/30/24  Myrlene Broker, MD  calcium-vitamin D (OSCAL WITH D) 500-200 MG-UNIT tablet Take 1 tablet by mouth.    [provider]  Drospirenone-Estetrol 3-14.2 MG TABS Take 1 tablet by mouth daily. 12/22/21   Lazaro Arms, MD  etonogestrel (NEXPLANON) 68 MG IMPL implant 1 each by Subdermal route once.    [provider]  FLUoxetine (PROZAC) 40 MG capsule TAKE 1 CAPSULE BY MOUTH EVERY DAY 03/31/23   Myrlene Broker, MD  ibuprofen (ADVIL)  600 MG tablet Take 1 tablet (600 mg total) by mouth every 6 (six) hours as needed. 12/14/22   Jacalyn Lefevre, MD  lisinopril-hydrochlorothiazide (ZESTORETIC) 20-12.5 MG tablet Take 1 tablet by mouth daily. 04/25/23   Gilmore Laroche, FNP  meloxicam (MOBIC) 7.5 MG tablet Take 7.5 mg by mouth daily as needed for pain. 08/15/19   [provider]  oxyCODONE-acetaminophen (PERCOCET/ROXICET) 5-325 MG tablet Take 1 tablet by mouth every 8 (eight) hours as needed for severe pain. 07/05/23  Yes Sanjna Haskew, Raynelle Fanning, PA-C  pantoprazole (PROTONIX) 40 MG tablet TAKE 1 TABLET(40 MG) BY MOUTH TWICE DAILY 07/19/22   Gelene Mink, NP  polyethylene glycol powder [facility]) 17 GM/SCOOP powder 1-4 scoop daily or as needed 12/17/21   Lazaro Arms, MD  QUEtiapine (SEROQUEL) 100 MG tablet Take 1 tablet (100 mg total) by mouth 2 (two) times daily. 03/31/23   Myrlene Broker, MD  TRULANCE 3 MG TABS TAKE 1 TABLET BY MOUTH DAILY 07/13/22   Aida Raider, NP      Allergies    Hydrocodone-acetaminophen, Lortab [hydrocodone-acetaminophen], and Morphine and codeine    Review of Systems   Review of Systems  Constitutional:  Negative for fever.  HENT: Negative.    Eyes: Negative.   Respiratory:  Negative for shortness of breath.   Cardiovascular:  Negative for chest pain.  Gastrointestinal:  Positive for abdominal distention, abdominal pain, constipation, nausea and rectal pain. Negative for vomiting.  Genitourinary:  Positive for pelvic pain. Negative  for dysuria, vaginal bleeding and vaginal discharge.  Musculoskeletal:  Negative for arthralgias, joint swelling and neck pain.  Skin: Negative.  Negative for rash and wound.  Neurological:  Negative for dizziness, weakness, light-headedness, numbness and headaches.  Psychiatric/Behavioral: Negative.      Physical Exam Updated Vital Signs BP 126/81   Pulse 75   Temp 98.8 F (37.1 C) (Oral)   Resp 15   Ht 5\' 3"  (1.6 m)   Wt 89.4 kg   SpO2 100%   BMI 34.91  kg/m  Physical Exam Vitals and nursing note reviewed. Exam conducted with a chaperone present.  Constitutional:      Appearance: She is well-developed.  HENT:     Head: Normocephalic and atraumatic.  Eyes:     Conjunctiva/sclera: Conjunctivae normal.  Cardiovascular:     Rate and Rhythm: Normal rate and regular rhythm.     Heart sounds: Normal heart sounds.  Pulmonary:     Effort: Pulmonary effort is normal.     Breath sounds: Normal breath sounds. No wheezing.  Abdominal:     General: Bowel sounds are normal. There is distension.     Palpations: Abdomen is soft.     Tenderness: There is abdominal tenderness in the suprapubic area. There is no guarding or rebound.  Genitourinary:    Vagina: No vaginal discharge.     Cervix: No cervical bleeding.     Comments: Prolapse noted posterior vaginal wall with valsalva,  no external prolapse (in supine position) .  Musculoskeletal:        General: Normal range of motion.     Cervical back: Normal range of motion.  Skin:    General: Skin is warm and dry.  Neurological:     Mental Status: She is alert.     ED Results / Procedures / Treatments   Labs (all labs ordered are listed, but only abnormal results are displayed) Labs Reviewed  COMPREHENSIVE METABOLIC PANEL - Abnormal; Notable for the following components:      Result Value   Sodium 133 (*)    Potassium 3.4 (*)    CO2 21 (*)    Glucose, Bld 114 (*)    All other components within normal limits  CBC - Abnormal; Notable for the following components:   WBC 11.9 (*)    All other components within normal limits  URINALYSIS, ROUTINE W REFLEX MICROSCOPIC - Abnormal; Notable for the following components:   APPearance HAZY (*)    All other components within normal limits  LIPASE, BLOOD  POC URINE PREG, ED    EKG None  Radiology CT ABDOMEN PELVIS W CONTRAST  Result Date: 07/05/2023 CLINICAL DATA:  Bowel obstruction suspected. Worsening rectovaginal pain and bloating for 3  days. History of rectocele and cystocele. EXAM: CT ABDOMEN AND PELVIS WITH CONTRAST TECHNIQUE: Multidetector CT imaging of the abdomen and pelvis was performed using the standard protocol following bolus administration of intravenous contrast. RADIATION DOSE REDUCTION: This exam was performed according to the departmental dose-optimization program which includes automated exposure control, adjustment of the mA and/or kV according to patient size and/or use of iterative reconstruction technique. CONTRAST:  OMNIPAQUE IOHEXOL 300 MG/ML  SOLN COMPARISON:  06/05/2020 FINDINGS: Lower chest: Lung bases are clear. Hepatobiliary: No focal liver abnormality is seen. Status post cholecystectomy. No biliary dilatation. Pancreas: Unremarkable. No pancreatic ductal dilatation or surrounding inflammatory changes. Spleen: Normal in size without focal abnormality. Adrenals/Urinary Tract: Adrenal glands are unremarkable. Kidneys are normal, without renal calculi, focal lesion,  or hydronephrosis. Bladder is unremarkable. Stomach/Bowel: Stomach, small bowel, and colon are not abnormally distended. No wall thickening or inflammatory changes are appreciated. Scattered fluid in the: Admixed with stool. This may indicate liquid stool. Appendix is normal. Vascular/Lymphatic: No significant vascular findings are present. No enlarged abdominal or pelvic lymph nodes. Reproductive: Uterus and bilateral adnexa are unremarkable. Other: No abdominal wall hernia or abnormality. No abdominopelvic ascites. Musculoskeletal: No acute or significant osseous findings. IMPRESSION: No acute process demonstrated in the abdomen or pelvis. No evidence of bowel obstruction or inflammation. Some liquid stool suggested in the colon. Electronically Signed   By: Burman Nieves M.D.   On: 07/05/2023 18:45    Procedures Procedures    Medications Ordered in ED Medications  fentaNYL (SUBLIMAZE) injection 50 mcg (50 mcg Intravenous Given 07/05/23 1851)   ondansetron (ZOFRAN) injection 4 mg (4 mg Intravenous Given 07/05/23 1851)  iohexol (OMNIPAQUE) 300 MG/ML solution 100 mL (100 mLs Intravenous Contrast Given 07/05/23 1838)    ED Course/ Medical Decision Making/ A&P                                 Medical Decision Making Patient presenting with worsened pelvic pain with known rectal and bladder prolapse, no history or exam findings to suggest vaginal infection. Exam  revealing for internal rectal prolapse, lesser involving bladder.  She will need close f/u with gyn to consider pessary, she does state they have discussed surgery but she is trying to prolong given her age.  Advised close f/u with Dr. Despina Hidden,  will treat pain, but encouraged to take miralax to avoid constipation and need for straining.   Amount and/or Complexity of Data Reviewed Labs: ordered.    Details: WBC count 11.9, c-Met unremarkable, urinalysis negative. Radiology: ordered.    Details: CT abdomen pelvis negative for acute findings.  Risk Prescription drug management.           Final Clinical Impression(s) / ED Diagnoses Final diagnoses:  Pelvic pain in female  Rectal prolapse    Rx / DC Orders ED Discharge Orders          Ordered    oxyCODONE-acetaminophen (PERCOCET/ROXICET) 5-325 MG tablet  Every 8 hours PRN        07/05/23 1953              Victoriano Lain 07/06/23 0920    Gloris Manchester, MD 07/09/23 2251

## 2023-07-05 NOTE — Discharge Instructions (Signed)
You will need close follow-up with Dr. Despina Hidden for this condition, you may benefit from a pessary or other internal device to help prevent this prolapse while you are deciding about definitive surgery to correct this problem.  In the interim I have prescribed you some pain medication, use this sparingly however as it can cause constipation.  You definitely need to continue taking your MiraLAX to avoid straining to have a bowel movement.

## 2023-07-05 NOTE — ED Notes (Signed)
Discharge instructions provided by edp were discussed with pt. Pt was able to verbalize understanding with no additional questions at this time.

## 2023-07-05 NOTE — ED Triage Notes (Signed)
Pt comes in with lower abdominal pain worsening the past 3 days. Pt has hx of IBS and states "this is worse." Pt states pressure and pain along with the abdominal pain and states she has prolapsed rectum w/ (rectaseal and cystaseal.) Pt states while trying to have a bm she feels like her intestines are coming through her vagina.

## 2023-07-05 NOTE — ED Provider Triage Note (Signed)
Emergency Medicine Provider Triage Evaluation Note  SAYDIE FEBUS , a 37 y.o. female  was evaluated in triage.  Pt complains of lower abdominal/pelvic pain.  Patient has a history of IBS but also has a history of rectocele and cystocele under the care of Dr. Despina Hidden, worsening vaginal and rectal pain and increasing difficulty having a bowel movement.  She describes that when she Valsalva's to try to have a BM, she has severe pain and difficulty passing her stools.  She has resorted to splinting her vaginal wall which has been less than effective.  She also endorses increasing generalized abdominal distention, nausea, loss of appetite, several episodes of emesis since her symptoms started about 3 days ago.  Denies fevers.  Review of Systems  Positive: Abdominal pain, distention, rectal prolapse with constipation, n/v Negative: Fever, dysuria  Physical Exam  BP 125/89 (BP Location: Right Arm)   Pulse 92   Temp 98.8 F (37.1 C) (Oral)   Resp 18   Ht 5\' 3"  (1.6 m)   Wt 89.4 kg   SpO2 98%   BMI 34.91 kg/m  Gen:   Awake, no distress   Resp:  Normal effort  MSK:   Moves extremities without difficulty  Other:  Significant abdominal distention.   Medical Decision Making  Medically screening exam initiated at 5:35 PM.  Appropriate orders placed.  AMILY CARRAGHER was informed that the remainder of the evaluation will be completed by another provider, this initial triage assessment does not replace that evaluation, and the importance of remaining in the ED until their evaluation is complete.     Burgess Amor, PA-C 07/05/23 1739

## 2023-07-06 ENCOUNTER — Encounter: Payer: Self-pay | Admitting: Obstetrics & Gynecology

## 2023-07-06 ENCOUNTER — Other Ambulatory Visit (HOSPITAL_COMMUNITY)
Admission: RE | Admit: 2023-07-06 | Discharge: 2023-07-06 | Disposition: A | Discharge status: Home / Self Care | Payer: Medicaid Other | Source: Ambulatory Visit | Attending: Obstetrics & Gynecology | Admitting: Obstetrics & Gynecology

## 2023-07-06 ENCOUNTER — Ambulatory Visit (INDEPENDENT_AMBULATORY_CARE_PROVIDER_SITE_OTHER): Payer: Medicaid Other | Admitting: Obstetrics & Gynecology

## 2023-07-06 VITALS — BP 106/71 | HR 85 | Ht 63.0 in | Wt 200.0 lb

## 2023-07-06 DIAGNOSIS — K5909 Other constipation: Secondary | ICD-10-CM

## 2023-07-06 DIAGNOSIS — Z124 Encounter for screening for malignant neoplasm of cervix: Secondary | ICD-10-CM

## 2023-07-06 DIAGNOSIS — N812 Incomplete uterovaginal prolapse: Secondary | ICD-10-CM

## 2023-07-06 DIAGNOSIS — N816 Rectocele: Secondary | ICD-10-CM | POA: Diagnosis not present

## 2023-07-06 NOTE — Progress Notes (Signed)
Chief Complaint  Patient presents with   Follow-up    Seen in ED for prolapse      37 y.o. [id number] No LMP recorded. Patient has had an implant. The current method of family planning is nexplanon.  Outpatient Encounter Medications as of 07/06/2023  Medication Sig   ALPRAZolam (XANAX) 1 MG tablet Take 1 tablet (1 mg total) by mouth 2 (two) times daily as needed for anxiety.   amphetamine-dextroamphetamine (ADDERALL) 20 MG tablet Take 1 tablet (20 mg total) by mouth 2 (two) times daily.   calcium-vitamin D (OSCAL WITH D) 500-200 MG-UNIT tablet Take 1 tablet by mouth.   Drospirenone-Estetrol 3-14.2 MG TABS Take 1 tablet by mouth daily.   etonogestrel (NEXPLANON) 68 MG IMPL implant 1 each by Subdermal route once.   FLUoxetine (PROZAC) 40 MG capsule TAKE 1 CAPSULE BY MOUTH EVERY DAY   ibuprofen (ADVIL) 600 MG tablet Take 1 tablet (600 mg total) by mouth every 6 (six) hours as needed.   lisinopril-hydrochlorothiazide (ZESTORETIC) 20-12.5 MG tablet Take 1 tablet by mouth daily.   meloxicam (MOBIC) 7.5 MG tablet Take 7.5 mg by mouth daily as needed for pain.   pantoprazole (PROTONIX) 40 MG tablet TAKE 1 TABLET(40 MG) BY MOUTH TWICE DAILY   polyethylene glycol powder (GLYCOLAX/MIRALAX) 17 GM/SCOOP powder 1-4 scoop daily or as needed   QUEtiapine (SEROQUEL) 100 MG tablet Take 1 tablet (100 mg total) by mouth 2 (two) times daily.   TRULANCE 3 MG TABS TAKE 1 TABLET BY MOUTH DAILY   oxyCODONE-acetaminophen (PERCOCET/ROXICET) 5-325 MG tablet Take 1 tablet by mouth every 8 (eight) hours as needed for severe pain. (Patient not taking: Reported on 07/06/2023)   No facility-administered encounter medications on file as of 07/06/2023.    Subjective Pt is seen for follow up of her POP cystocoele 2/rectocoele Seen in ED for acute pain Work up was negative See my exam 1/23  Past Medical History:  Diagnosis Date   Anxiety    Anxiety    Arthritis    Depression    History of anemia 2013    History of depression    states stopped med. 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   Hypertension    Osteopenia    Pertussis    Postcoital bleeding 07/14/2015   Sinus infection 01/25/2013   started antibiotic 01/24/2013 x 10 days; nasal congestion, clear drainage from nose   Tonsillar and adenoid hypertrophy 01/2013   Vaginal irritation 03/25/2014   Mild yeast but has history of yeast, will not treat til after first trimester,try yogurt    Past Surgical History:  Procedure Laterality Date   CHOLECYSTECTOMY  04/23/2010   laparoscopic   COLONOSCOPY WITH PROPOFOL N/A 05/17/2016   Procedure: COLONOSCOPY WITH PROPOFOL;  Surgeon: West Bali, MD;  Location: [facility];  Service: Endoscopy;  Laterality: N/A;  1000   HEMORRHOID BANDING N/A 05/17/2016   Procedure: HEMORRHOID BANDING;  Surgeon: West Bali, MD;  Location: [facility];  Service: Endoscopy;  Laterality: N/A;   HEMORRHOID SURGERY N/A 06/08/2016   Procedure: SIMPLE HEMORRHOIDECTOMY;  Surgeon: Ancil Linsey, MD;  Location: [facility];  Service: General;  Laterality: N/A;   PARTIAL NEPHRECTOMY Right age 68 mos.   TONSILLECTOMY AND ADENOIDECTOMY N/A 01/29/2013   Procedure: TONSILLECTOMY AND ADENOIDECTOMY;  Surgeon: Darletta Moll, MD;  Location: [facility];  Service: ENT;  Laterality: N/A;  OB History     Gravida  2   Para  2   Term  1   Preterm  1   AB      Living  2      SAB      IAB      Ectopic      Multiple      Live Births  2           Allergies  Allergen Reactions   Hydrocodone-Acetaminophen Nausea Only   Lortab [Hydrocodone-Acetaminophen] Nausea And Vomiting   Morphine And Codeine Rash    Social History   Socioeconomic History   Marital status: Married    Spouse name: Not on file   Number of children: Not on file   Years of education: Not on file   Highest education level: Not on file  Occupational History   Not on  file  Tobacco Use   Smoking status: Never    Passive exposure: Never   Smokeless tobacco: Never   Tobacco comments:    Never smoked  Vaping Use   Vaping status: Never Used  Substance and Sexual Activity   Alcohol use: Yes    Alcohol/week: 0.0 standard drinks of alcohol    Comment: socially   Drug use: No   Sexual activity: Yes    Birth control/protection: Implant    Comment: nexplanon implant  Other Topics Concern   Not on file  Social History Narrative   Not on file   Social Determinants of Health   Financial Resource Strain: Not on file  Food Insecurity: Not on file  Transportation Needs: Not on file  Physical Activity: Not on file  Stress: Not on file  Social Connections: Not on file    Family History  Problem Relation Age of Onset   COPD Mother    Depression Mother    Anxiety disorder Mother    Hypertension Mother    Irritable bowel syndrome Mother    Arthritis Mother    Skin cancer Mother    Thyroid cancer Mother    Ovarian cancer Maternal Aunt    Ovarian cancer Maternal Grandmother    Cancer Maternal Grandmother        bladder, kidney   Hypertension Father    GER disease Father    Bipolar disorder Paternal Uncle    Anxiety disorder Cousin    Drug abuse Cousin    Bipolar disorder Cousin    Colon cancer Neg Hx     Medications:       Current Outpatient Medications:    ALPRAZolam (XANAX) 1 MG tablet, Take 1 tablet (1 mg total) by mouth 2 (two) times daily as needed for anxiety., Disp: 60 tablet, Rfl: 2   amphetamine-dextroamphetamine (ADDERALL) 20 MG tablet, Take 1 tablet (20 mg total) by mouth 2 (two) times daily., Disp: 60 tablet, Rfl: 0   calcium-vitamin D (OSCAL WITH D) 500-200 MG-UNIT tablet, Take 1 tablet by mouth., Disp: , Rfl:    Drospirenone-Estetrol 3-14.2 MG TABS, Take 1 tablet by mouth daily., Disp: 28 tablet, Rfl: 12   etonogestrel (NEXPLANON) 68 MG IMPL implant, 1 each by Subdermal route once., Disp: , Rfl:    FLUoxetine (PROZAC) 40 MG  capsule, TAKE 1 CAPSULE BY MOUTH EVERY DAY, Disp: 30 capsule, Rfl: 2   ibuprofen (ADVIL) 600 MG tablet, Take 1 tablet (600 mg total) by mouth every 6 (six) hours as needed., Disp: 30 tablet, Rfl: 0   lisinopril-hydrochlorothiazide (ZESTORETIC) 20-12.5 MG tablet, Take  1 tablet by mouth daily., Disp: 90 tablet, Rfl: 0   meloxicam (MOBIC) 7.5 MG tablet, Take 7.5 mg by mouth daily as needed for pain., Disp: , Rfl:    pantoprazole (PROTONIX) 40 MG tablet, TAKE 1 TABLET(40 MG) BY MOUTH TWICE DAILY, Disp: 180 tablet, Rfl: 3   polyethylene glycol powder (GLYCOLAX/MIRALAX) 17 GM/SCOOP powder, 1-4 scoop daily or as needed, Disp: 255 g, Rfl: 11   QUEtiapine (SEROQUEL) 100 MG tablet, Take 1 tablet (100 mg total) by mouth 2 (two) times daily., Disp: 60 tablet, Rfl: 2   TRULANCE 3 MG TABS, TAKE 1 TABLET BY MOUTH DAILY, Disp: 30 tablet, Rfl: 3   oxyCODONE-acetaminophen (PERCOCET/ROXICET) 5-325 MG tablet, Take 1 tablet by mouth every 8 (eight) hours as needed for severe pain. (Patient not taking: Reported on 07/06/2023), Disp: 12 tablet, Rfl: 0  Objective Blood pressure 106/71, pulse 85, height 5\' 3"  (1.6 m), weight 200 lb (90.7 kg).  General WDWN female NAD Vulva:  normal appearing vulva with no masses, tenderness or lesions Vagina:  normal mucosa, no discharge grade 2 cystocoele Cervix:  Normal no lesions Uterus:  normal size, contour, position, consistency, mobility, non-tender, Grade 1 descent Adnexa: ovaries:present,  normal adnexa in size, nontender and no masses Grade 2 rectocoele   Pertinent ROS No burning with urination, frequency or urgency No nausea, vomiting or diarrhea Nor fever chills or other constitutional symptoms   Labs or studies     Impression + Management Plan: Diagnoses this Encounter::   ICD-10-CM   1. Cystocele with second degree uterine prolapse  N81.2     2. Pelvic organ prolapse quantification stage 2 rectocele  N81.6     3. Other constipation: IBS and recotcoele   K59.09     4. Routine cervical smear  Z12.4 Cytology - [facility]( [place])      Discussed options: surgery vs pessary trial  Pt will think about it and let me know her thoughts  Medications prescribed during  this encounter: No orders of the defined types were placed in this encounter.   Labs or Scans Ordered during this encounter: No orders of the defined types were placed in this encounter.     Follow up Return if symptoms worsen or fail to improve.

## 2023-07-10 ENCOUNTER — Encounter: Payer: Self-pay | Admitting: Obstetrics & Gynecology

## 2023-07-14 ENCOUNTER — Other Ambulatory Visit: Payer: Self-pay | Admitting: Gastroenterology

## 2023-07-14 DIAGNOSIS — K581 Irritable bowel syndrome with constipation: Secondary | ICD-10-CM

## 2023-07-21 ENCOUNTER — Emergency Department (HOSPITAL_COMMUNITY)
Admission: EM | Admit: 2023-07-21 | Discharge: 2023-07-21 | Disposition: A | Discharge status: Home / Self Care | Payer: Medicaid Other | Attending: Emergency Medicine | Admitting: Emergency Medicine

## 2023-07-21 ENCOUNTER — Emergency Department (HOSPITAL_COMMUNITY): Payer: Medicaid Other

## 2023-07-21 DIAGNOSIS — W172XXA Fall into hole, initial encounter: Secondary | ICD-10-CM | POA: Insufficient documentation

## 2023-07-21 DIAGNOSIS — S92124A Nondisplaced fracture of body of right talus, initial encounter for closed fracture: Secondary | ICD-10-CM | POA: Insufficient documentation

## 2023-07-21 DIAGNOSIS — I10 Essential (primary) hypertension: Secondary | ICD-10-CM | POA: Diagnosis not present

## 2023-07-21 DIAGNOSIS — S92101A Unspecified fracture of right talus, initial encounter for closed fracture: Secondary | ICD-10-CM

## 2023-07-21 DIAGNOSIS — Y92096 Garden or yard of other non-institutional residence as the place of occurrence of the external cause: Secondary | ICD-10-CM | POA: Diagnosis not present

## 2023-07-21 DIAGNOSIS — S99921A Unspecified injury of right foot, initial encounter: Secondary | ICD-10-CM | POA: Diagnosis present

## 2023-07-21 MED ORDER — OXYCODONE-ACETAMINOPHEN 5-325 MG PO TABS
1.0000 | ORAL_TABLET | Freq: Four times a day (QID) | ORAL | 0 refills | Status: DC | PRN
Start: 2023-07-21 — End: 2023-12-11

## 2023-07-21 MED ORDER — OXYCODONE-ACETAMINOPHEN 5-325 MG PO TABS
1.0000 | ORAL_TABLET | Freq: Once | ORAL | Status: AC
  Administered 2023-07-21: 1 via ORAL
  Filled 2023-07-21: qty 1

## 2023-07-21 NOTE — ED Triage Notes (Signed)
Pt resents with swelling and deformity to right foot after falling into a hole in yard playing dog. No other injuries.

## 2023-07-21 NOTE — Discharge Instructions (Addendum)
You are seen in the emergency department today for fall with ankle pain.  As we discussed it looks like you likely broke one of the bones in your hindfoot.  There is some evidence of piece of bone between your talus and navicular bones.  We have placed you in a splint and given you crutches. It is important you do not bear weight on the affected foot.  I have sent a prescription for pain medication to your pharmacy.  It is important that you follow-up with an orthopedist.  I have given you the contact information for such.

## 2023-07-21 NOTE — ED Provider Notes (Signed)
Sandra Glover   CSN: [id number] Arrival date & time: 07/21/23  1940     History  Chief Complaint  Patient presents with   Fall   Ankle Pain    Sandra Glover is a 37 y.o. female with history of anemia, depression, anxiety, hypertension, osteopenia, arthritis, who presents to the emergency department with a right foot/ankle injury.  She states that she was playing with her dog in their yard and stepped into a hole on accident.  She has had swelling and pain to the right foot since then.  No other trauma.   Fall  Ankle Pain      Home Medications Prior to Admission medications   Medication Sig Start Date End Date Taking? Authorizing Provider  oxyCODONE-acetaminophen (PERCOCET/ROXICET) 5-325 MG tablet Take 1 tablet by mouth every 6 (six) hours as needed for severe pain. 07/21/23  Yes Sandra Muckle Glover, Sandra Glover  ALPRAZolam (XANAX) 1 MG tablet Take 1 tablet (1 mg total) by mouth 2 (two) times daily as needed for anxiety. 03/31/23 03/30/24  Myrlene Broker, MD  amphetamine-dextroamphetamine (ADDERALL) 20 MG tablet Take 1 tablet (20 mg total) by mouth 2 (two) times daily. 03/31/23 03/30/24  Myrlene Broker, MD  calcium-vitamin D (OSCAL WITH D) 500-200 MG-UNIT tablet Take 1 tablet by mouth.    [provider]  Drospirenone-Estetrol 3-14.2 MG TABS Take 1 tablet by mouth daily. 12/22/21   Lazaro Arms, MD  etonogestrel (NEXPLANON) 68 MG IMPL implant 1 each by Subdermal route once.    [provider]  FLUoxetine (PROZAC) 40 MG capsule TAKE 1 CAPSULE BY MOUTH EVERY DAY 03/31/23   Myrlene Broker, MD  ibuprofen (ADVIL) 600 MG tablet Take 1 tablet (600 mg total) by mouth every 6 (six) hours as needed. 12/14/22   Jacalyn Lefevre, MD  lisinopril-hydrochlorothiazide (ZESTORETIC) 20-12.5 MG tablet Take 1 tablet by mouth daily. 04/25/23   Gilmore Laroche, FNP  meloxicam (MOBIC) 7.5 MG tablet Take 7.5 mg by mouth daily as needed  for pain. 08/15/19   [provider]  pantoprazole (PROTONIX) 40 MG tablet TAKE 1 TABLET(40 MG) BY MOUTH TWICE DAILY 07/19/22   Gelene Mink, NP  polyethylene glycol powder [facility]) 17 GM/SCOOP powder 1-4 scoop daily or as needed 12/17/21   Lazaro Arms, MD  QUEtiapine (SEROQUEL) 100 MG tablet Take 1 tablet (100 mg total) by mouth 2 (two) times daily. 03/31/23   Myrlene Broker, MD  TRULANCE 3 MG TABS TAKE 1 TABLET BY MOUTH DAILY 07/17/23 09/15/23  Aida Raider, NP      Allergies    Hydrocodone-acetaminophen, Lortab [hydrocodone-acetaminophen], and Morphine and codeine    Review of Systems   Review of Systems  Musculoskeletal:  Positive for arthralgias.  All other systems reviewed and are negative.   Physical Exam Updated Vital Signs BP 120/86 (BP Location: Right Arm)   Pulse 92   Temp 98.5 F (36.9 C) (Oral)   Resp 16   LMP 07/16/2023   SpO2 98%  Physical Exam Vitals and nursing Glover reviewed.  Constitutional:      Appearance: Normal appearance.  HENT:     Head: Normocephalic and atraumatic.  Eyes:     Conjunctiva/sclera: Conjunctivae normal.  Pulmonary:     Effort: Pulmonary effort is normal. No respiratory distress.  Musculoskeletal:     Right foot: Decreased range of motion. Deformity present.  Feet:     Comments: Edema to the right  hindfoot, able to wiggle the toes, mild decrease sensation, normal capillary refill, limited ROM of right ankle due to pain  Skin:    General: Skin is warm and dry.  Neurological:     Mental Status: She is alert.  Psychiatric:        Mood and Affect: Mood normal.        Behavior: Behavior normal.     ED Results / Procedures / Treatments   Labs (all labs ordered are listed, but only abnormal results are displayed) Labs Reviewed - No data to display  EKG None  Radiology DG Foot Complete Right  Result Date: 07/21/2023 CLINICAL DATA:  Twisted foot while walking dog EXAM: RIGHT FOOT COMPLETE - 3+ VIEW  COMPARISON:  None Available. FINDINGS: Small calcific fragments overlying the dorsal talonavicular joint on lateral view. Overlying soft tissue edema. Joint spaces are otherwise well preserved. No other evidence of fracture. IMPRESSION: Small calcific fragments overlying the dorsal talonavicular joint on lateral view, consistent with avulsion fracture. Overlying soft tissue edema. Joint spaces are otherwise well preserved. No other evidence of fracture. Electronically Signed   By: Jearld Lesch M.D.   On: 07/21/2023 20:11    Procedures Procedures    Medications Ordered in ED Medications  oxyCODONE-acetaminophen (PERCOCET/ROXICET) 5-325 MG per tablet 1 tablet (1 tablet Oral Given 07/21/23 2034)    ED Course/ Medical Decision Making/ A&P                                 Medical Decision Making Amount and/or Complexity of Data Reviewed Radiology: ordered.  Risk Prescription drug management.   This patient is a 37 y.o. female  who presents to the ED for concern of right ankle injury earlier this evening.   Past Medical History / Co-morbidities / Social History: anemia, depression, anxiety, hypertension, osteopenia, arthritis  Additional history: Chart reviewed. Pertinent results include: PDMP reviewed  Physical Exam: Physical exam performed. The pertinent findings include: Edema to the right hindfoot, neurovascularly intact, can wiggle toes, normal capillary refill  Lab Tests/Imaging studies: I personally interpreted labs/imaging and the pertinent results include:  right foot XR shows small calcific fragment overlying dorsal talonavicular joint, consistent with avulsion fracture. I agree with the radiologist interpretation.  Medications: I ordered medication including percocet.  I have reviewed the patients home medicines and have made adjustments as needed.  Treatment: Placed in posterior short leg splint and given crutches   Disposition: After consideration of the diagnostic  results and the patients response to treatment, I feel that emergency department workup does not suggest an emergent condition requiring admission or immediate intervention beyond what has been performed at this time. The plan is: discharge to home with treatment of right hindfoot fracture. Instructed to be non-weight bearing. Given short course of pain medication. Encouraged to follow up with orthopedics. The patient is safe for discharge and has been instructed to return immediately for worsening symptoms, change in symptoms or any other concerns.  Final Clinical Impression(s) / ED Diagnoses Final diagnoses:  Closed nondisplaced fracture of right talus, unspecified portion of talus, initial encounter    Rx / DC Orders ED Discharge Orders          Ordered    oxyCODONE-acetaminophen (PERCOCET/ROXICET) 5-325 MG tablet  Every 6 hours PRN        07/21/23 2035           Portions of this report may have  been transcribed using voice recognition software. Every effort was made to ensure accuracy; however, inadvertent computerized transcription errors may be present.    Jeanella Flattery 07/21/23 2044    Bethann Berkshire, MD 07/22/23 1204

## 2023-07-25 ENCOUNTER — Ambulatory Visit: Payer: Medicaid Other | Admitting: Orthopedic Surgery

## 2023-07-25 ENCOUNTER — Encounter: Payer: Self-pay | Admitting: Orthopedic Surgery

## 2023-07-25 ENCOUNTER — Ambulatory Visit: Payer: Medicaid Other | Admitting: Obstetrics & Gynecology

## 2023-07-25 VITALS — BP 102/64 | HR 88

## 2023-07-25 DIAGNOSIS — S92151A Displaced avulsion fracture (chip fracture) of right talus, initial encounter for closed fracture: Secondary | ICD-10-CM | POA: Diagnosis not present

## 2023-07-25 DIAGNOSIS — G5621 Lesion of ulnar nerve, right upper limb: Secondary | ICD-10-CM | POA: Diagnosis not present

## 2023-07-25 DIAGNOSIS — G5601 Carpal tunnel syndrome, right upper limb: Secondary | ICD-10-CM | POA: Diagnosis not present

## 2023-07-25 MED ORDER — GABAPENTIN 100 MG PO CAPS: 100.0000 mg | ORAL_CAPSULE | Freq: Three times a day (TID) | ORAL | 2 refills | Status: DC

## 2023-07-25 NOTE — Patient Instructions (Addendum)
OOW 3 WEEKS  

## 2023-07-25 NOTE — Progress Notes (Signed)
New patient from the ER  Chief complaint  Chief Complaint  Patient presents with   Ankle Pain    Right ankle DOI 07/21/2023 playing with dog and stepped in a hole and twisted ankle and fell heard huge crack ankle was sticking out     History basically 37 year old female confirms the ER records which were reviewed that she stepped in a hole and injured her right ankle on 16 August comes in for follow-up complaining of pain and swelling decreased range of motion  X-rays showed an avulsion fracture from the dorsal portion of the talus.  She is complaining of medial, lateral and dorsal foot pain  Problem list, medical hx, medications and allergies reviewed   BP 102/64   Pulse 88   LMP 07/16/2023   Exam findings right foot ecchymosis dorsally medially laterally with tenderness in all 3 areas.  However when I flexed her knee I was able to get her foot in the plantigrade position and she was able to go into a cam walker boot.  I could not get much of a stress test done because of the pain and swelling  The skin did not show any blistering and it was neurovascularly intact  She was awake alert and oriented and her mood and affect were pleasant  Assessment of the x-rays again, (independent review) 3 views of the foot: Dorsal avulsion fracture near the talus.  No other fracture seen.  Assessment and plan 37 year old female avulsion fracture of the talus recommend crutches nonweightbearing Epsom salt baths.  Recommend weightbearing in about 4 5 days  She has steps at work I think she will be out at least 2 weeks and possibly 3  She is going to follow-up in 3 weeks to see if we have made any progress and she can return to work in [place]  She wanted some gabapentin to help her carpal tunnel and cubital tunnel as the crutches have been exacerbating that and of course that was agreed to.  Encounter Diagnoses  Name Primary?   Closed displaced avulsion fracture of right talus, initial encounter  Yes   Carpal tunnel syndrome of right wrist    Cubital tunnel syndrome on right     Meds ordered this encounter  Medications   gabapentin (NEURONTIN) 100 MG capsule    Sig: Take 1 capsule (100 mg total) by mouth 3 (three) times daily.    Dispense:  90 capsule    Refill:  2

## 2023-07-26 ENCOUNTER — Ambulatory Visit: Payer: Medicaid Other | Admitting: Family Medicine

## 2023-07-31 ENCOUNTER — Telehealth (HOSPITAL_COMMUNITY): Payer: Medicaid Other | Admitting: Psychiatry

## 2023-07-31 ENCOUNTER — Encounter (HOSPITAL_COMMUNITY): Payer: Self-pay | Admitting: Psychiatry

## 2023-07-31 DIAGNOSIS — F988 Other specified behavioral and emotional disorders with onset usually occurring in childhood and adolescence: Secondary | ICD-10-CM | POA: Diagnosis not present

## 2023-07-31 DIAGNOSIS — F419 Anxiety disorder, unspecified: Secondary | ICD-10-CM | POA: Diagnosis not present

## 2023-07-31 DIAGNOSIS — F900 Attention-deficit hyperactivity disorder, predominantly inattentive type: Secondary | ICD-10-CM

## 2023-07-31 DIAGNOSIS — F331 Major depressive disorder, recurrent, moderate: Secondary | ICD-10-CM | POA: Diagnosis not present

## 2023-07-31 DIAGNOSIS — F4310 Post-traumatic stress disorder, unspecified: Secondary | ICD-10-CM

## 2023-07-31 MED ORDER — ALPRAZOLAM 1 MG PO TABS: 1.0000 mg | ORAL_TABLET | Freq: Two times a day (BID) | ORAL | 2 refills | Status: DC | PRN

## 2023-07-31 MED ORDER — AMPHETAMINE-DEXTROAMPHETAMINE 20 MG PO TABS: 20.0000 mg | ORAL_TABLET | Freq: Two times a day (BID) | ORAL | 0 refills | Status: DC

## 2023-07-31 MED ORDER — QUETIAPINE FUMARATE 100 MG PO TABS: 100.0000 mg | ORAL_TABLET | Freq: Two times a day (BID) | ORAL | 2 refills | Status: DC

## 2023-07-31 MED ORDER — FLUOXETINE HCL 40 MG PO CAPS: ORAL_CAPSULE | ORAL | 2 refills | Status: DC

## 2023-07-31 NOTE — Progress Notes (Signed)
Virtual Visit via Video Note  I connected with RAYVN STARK on 07/31/23 at 11:20 AM EDT by a video enabled telemedicine application and verified that I am speaking with the correct person using two identifiers.  Location: Patient: home Provider: office   I discussed the limitations of evaluation and management by telemedicine and the availability of in person appointments. The patient expressed understanding and agreed to proceed.      I discussed the assessment and treatment plan with the patient. The patient was provided an opportunity to ask questions and all were answered. The patient agreed with the plan and demonstrated an understanding of the instructions.   The patient was advised to call back or seek an in-person evaluation if the symptoms worsen or if the condition fails to improve as anticipated.  I provided 20 minutes of non-face-to-face time during this encounter.   [name], MD  [facility] MD/PA/NP OP Progress Note  07/31/2023 11:40 AM MIANICOLE PENT  MRN:  [id number]  Chief Complaint:  Chief Complaint  Patient presents with   Depression   Anxiety   Follow-up   HPI: This patient is a 37 year old married white female who lives with her husband and 2 children in [place].  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  The patient returns for follow-up after 3 months regarding her depression anxiety and ADD.  She has had some difficult medical issues recently.  She fell in a hole in her yard and broke her foot a couple of weeks ago.  She is now out of work for 3 weeks.  She has also had pelvic pain and found to have a prolapsed uterus.  She is going to be fitted for a pessary.  She had messaged me a couple of months ago about severe headaches and memory loss which seemed to resolve once her menstrual cycle started.  I urged her to talk to her gynecologist about this.  In general she states that her mood is stable.  She denies significant  depression or thoughts of self-harm or suicide.  Her anxiety is under good control.  She is sleeping well.  She very occasionally hears voices such as someone calling her name but for the most part this has been controlled with the Seroquel. Visit Diagnosis:    ICD-10-CM   1. Major depressive disorder, recurrent episode, moderate (HCC)  F33.1     2. Attention deficit hyperactivity disorder (ADHD), predominantly inattentive type  F90.0     3. PTSD (post-traumatic stress disorder)  F43.10       Past Psychiatric History: Long-term outpatient treatment  Past Medical History:  Past Medical History:  Diagnosis Date   Anxiety    Anxiety    Arthritis    Depression    History of anemia 2013   History of depression    states stopped med. 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   Hypertension    Osteopenia    Pertussis    Postcoital bleeding 07/14/2015   Sinus infection 01/25/2013   started antibiotic 01/24/2013 x 10 days; nasal congestion, clear drainage from nose   Tonsillar and adenoid hypertrophy 01/2013   Vaginal irritation 03/25/2014   Mild yeast but has history of yeast, will not treat til after first trimester,try yogurt    Past Surgical History:  Procedure Laterality Date   CHOLECYSTECTOMY  04/23/2010   laparoscopic   COLONOSCOPY WITH  PROPOFOL N/A 05/17/2016   Procedure: COLONOSCOPY WITH PROPOFOL;  Surgeon: West Bali, MD;  [facility];  Service: Endoscopy;  Laterality: N/A;  1000   HEMORRHOID BANDING N/A 05/17/2016   Procedure: HEMORRHOID BANDING;  Surgeon: West Bali, MD;  [facility];  Service: Endoscopy;  Laterality: N/A;   HEMORRHOID SURGERY N/A 06/08/2016   Procedure: SIMPLE HEMORRHOIDECTOMY;  Surgeon: Ancil Linsey, MD;  Location: [facility];  Service: General;  Laterality: N/A;   PARTIAL NEPHRECTOMY Right age 71 mos.   TONSILLECTOMY AND ADENOIDECTOMY N/A 01/29/2013   Procedure: TONSILLECTOMY  AND ADENOIDECTOMY;  Surgeon: Darletta Moll, MD;  Location: [facility];  Service: ENT;  Laterality: N/A;    Family Psychiatric History: See below  Family History:  Family History  Problem Relation Age of Onset   COPD Mother    Depression Mother    Anxiety disorder Mother    Hypertension Mother    Irritable bowel syndrome Mother    Arthritis Mother    Skin cancer Mother    Thyroid cancer Mother    Ovarian cancer Maternal Aunt    Ovarian cancer Maternal Grandmother    Cancer Maternal Grandmother        bladder, kidney   Hypertension Father    GER disease Father    Bipolar disorder Paternal Uncle    Anxiety disorder Cousin    Drug abuse Cousin    Bipolar disorder Cousin    Colon cancer Neg Hx     Social History:  Social History   Socioeconomic History   Marital status: Married    Spouse name: Not on file   Number of children: Not on file   Years of education: Not on file   Highest education level: Not on file  Occupational History   Not on file  Tobacco Use   Smoking status: Never    Passive exposure: Never   Smokeless tobacco: Never   Tobacco comments:    Never smoked  Vaping Use   Vaping status: Never Used  Substance and Sexual Activity   Alcohol use: Yes    Alcohol/week: 0.0 standard drinks of alcohol    Comment: socially   Drug use: No   Sexual activity: Yes    Birth control/protection: Implant    Comment: nexplanon implant  Other Topics Concern   Not on file  Social History Narrative   Not on file   Social Determinants of Health   Financial Resource Strain: Not on file  Food Insecurity: Not on file  Transportation Needs: Not on file  Physical Activity: Not on file  Stress: Not on file  Social Connections: Not on file    Allergies:  Allergies  Allergen Reactions   Hydrocodone-Acetaminophen Nausea Only   Lortab [Hydrocodone-Acetaminophen] Nausea And Vomiting   Morphine And Codeine Rash    Metabolic Disorder Labs: Lab Results   Component Value Date   HGBA1C 5.4 04/25/2023   No results found for: "PROLACTIN" Lab Results  Component Value Date   CHOL 193 04/25/2023   TRIG 149 04/25/2023   HDL 61 04/25/2023   CHOLHDL 3.2 04/25/2023   LDLCALC 106 (H) 04/25/2023   Lab Results  Component Value Date   TSH 1.170 04/25/2023   TSH 2.553 10/13/2017    Therapeutic Level Labs: No results found for: "LITHIUM" No results found for: "VALPROATE" No results found for: "CBMZ"  Current Medications: Current Outpatient Medications  Medication Sig Dispense Refill   amphetamine-dextroamphetamine (ADDERALL) 20 MG  tablet Take 1 tablet (20 mg total) by mouth 2 (two) times daily. 60 tablet 0   amphetamine-dextroamphetamine (ADDERALL) 20 MG tablet Take 1 tablet (20 mg total) by mouth 2 (two) times daily. 60 tablet 0   ALPRAZolam (XANAX) 1 MG tablet Take 1 tablet (1 mg total) by mouth 2 (two) times daily as needed for anxiety. 60 tablet 2   amphetamine-dextroamphetamine (ADDERALL) 20 MG tablet Take 1 tablet (20 mg total) by mouth 2 (two) times daily. 60 tablet 0   calcium-vitamin D (OSCAL WITH D) 500-200 MG-UNIT tablet Take 1 tablet by mouth.     Drospirenone-Estetrol 3-14.2 MG TABS Take 1 tablet by mouth daily. 28 tablet 12   etonogestrel (NEXPLANON) 68 MG IMPL implant 1 each by Subdermal route once.     FLUoxetine (PROZAC) 40 MG capsule TAKE 1 CAPSULE BY MOUTH EVERY DAY 30 capsule 2   gabapentin (NEURONTIN) 100 MG capsule Take 1 capsule (100 mg total) by mouth 3 (three) times daily. 90 capsule 2   ibuprofen (ADVIL) 600 MG tablet Take 1 tablet (600 mg total) by mouth every 6 (six) hours as needed. 30 tablet 0   lisinopril-hydrochlorothiazide (ZESTORETIC) 20-12.5 MG tablet Take 1 tablet by mouth daily. 90 tablet 0   meloxicam (MOBIC) 7.5 MG tablet Take 7.5 mg by mouth daily as needed for pain.     oxyCODONE-acetaminophen (PERCOCET/ROXICET) 5-325 MG tablet Take 1 tablet by mouth every 6 (six) hours as needed for severe pain. 6  tablet 0   pantoprazole (PROTONIX) 40 MG tablet TAKE 1 TABLET(40 MG) BY MOUTH TWICE DAILY 180 tablet 3   polyethylene glycol powder (GLYCOLAX/MIRALAX) 17 GM/SCOOP powder 1-4 scoop daily or as needed 255 g 11   QUEtiapine (SEROQUEL) 100 MG tablet Take 1 tablet (100 mg total) by mouth 2 (two) times daily. 60 tablet 2   TRULANCE 3 MG TABS TAKE 1 TABLET BY MOUTH DAILY 30 tablet 1   No current facility-administered medications for this visit.     Musculoskeletal: Strength & Muscle Tone: within normal limits Gait & Station: normal Patient leans: N/A  Psychiatric Specialty Exam: Review of Systems  Musculoskeletal:  Positive for arthralgias and gait problem.  All other systems reviewed and are negative.   Last menstrual period 07/16/2023.There is no height or weight on file to calculate BMI.  General Appearance: Casual and Fairly Groomed  Eye Contact:  Good  Speech:  Clear and Coherent  Volume:  Normal  Mood:  Euthymic  Affect:  Congruent  Thought Process:  Goal Directed  Orientation:  Full (Time, Place, and Person)  Thought Content: WDL   Suicidal Thoughts:  No  Homicidal Thoughts:  No  Memory:  Immediate;   Good Recent;   Good Remote;   Good  Judgement:  Good  Insight:  Fair  Psychomotor Activity:  Normal  Concentration:  Concentration: Good and Attention Span: Good  Recall:  Good  Fund of Knowledge: Good  Language: Good  Akathisia:  No  Handed:  Right  AIMS (if indicated): not done  Assets:  Communication Skills Desire for Improvement Physical Health Resilience Social Support Talents/Skills  ADL's:  Intact  Cognition: WNL  Sleep:  Good   Screenings: GAD-7    Flowsheet Row Office Visit from 04/25/2023 in [facility] Counselor from 09/27/2018 in [facility] at [place]  Total GAD-7 Score 13 18      PHQ2-9    Flowsheet Row Office Visit from 04/25/2023 in [facility]  Primary Care Video Visit from  07/13/2022 in [facility] at [place] Video Visit from 03/23/2022 in [facility] at [place] Video Visit from 12/10/2021 in [facility] Out[facility] at [place] Video Visit from 11/12/2021 in [facility] at [facility] Total Score 3 0 0 0 2  PHQ-9 Total Score 12 -- -- -- 8      Flowsheet Row ED from 07/05/2023 in [facility] Emergency Department at [facility] ED from 01/13/2023 in [facility] [place] ED from 12/14/2022 in [facility] Emergency Department at [facility]  C-SSRS RISK CATEGORY No Risk No Risk No Risk        Assessment and Plan: This patient is a 37 year old female with a history of depression anxiety ADD and possible bipolar 2 disorder.  For the most part she is doing well on her current regimen.  She will continue Adderall 20 mg twice daily for ADD, Seroquel 100 mg twice daily for mood stabilization, Prozac 40 mg daily for depression and Xanax 1 mg twice daily as needed for anxiety.  She will return to see me in 3 months  Collaboration of Care: Collaboration of Care: Primary Care Provider AEB notes are shared with PCP on the epic system  Patient/Guardian was advised Release of Information must be obtained prior to any record release in order to collaborate their care with an outside provider. Patient/Guardian was advised if they have not already done so to contact the registration department to sign all necessary forms in order for [place] to release information regarding their care.   Consent: Patient/Guardian gives verbal consent for treatment and assignment of benefits for services provided during this visit. Patient/Guardian expressed understanding and agreed to proceed.    [name], MD 07/31/2023, 11:40 AM

## 2023-08-04 ENCOUNTER — Other Ambulatory Visit: Payer: Self-pay | Admitting: Family Medicine

## 2023-08-04 DIAGNOSIS — E559 Vitamin D deficiency, unspecified: Secondary | ICD-10-CM

## 2023-08-05 ENCOUNTER — Encounter: Payer: Self-pay | Admitting: Orthopedic Surgery

## 2023-08-14 ENCOUNTER — Ambulatory Visit (INDEPENDENT_AMBULATORY_CARE_PROVIDER_SITE_OTHER): Payer: Medicaid Other | Admitting: Orthopedic Surgery

## 2023-08-14 ENCOUNTER — Encounter: Payer: Self-pay | Admitting: Orthopedic Surgery

## 2023-08-14 ENCOUNTER — Other Ambulatory Visit (INDEPENDENT_AMBULATORY_CARE_PROVIDER_SITE_OTHER): Payer: Medicaid Other

## 2023-08-14 VITALS — Ht 63.0 in | Wt 200.0 lb

## 2023-08-14 DIAGNOSIS — S92151D Displaced avulsion fracture (chip fracture) of right talus, subsequent encounter for fracture with routine healing: Secondary | ICD-10-CM

## 2023-08-14 NOTE — Patient Instructions (Addendum)
Needs note for work return to work on 08/17/23 Knee walker /scooter at work

## 2023-08-14 NOTE — Progress Notes (Signed)
Regular follow-up visit  Encounter Diagnosis  Name Primary?   Closed displaced avulsion fracture of right talus with routine healing, subsequent encounter 07/21/23 Yes    Complains of soreness right foot  The patient has been ambulatory in her cam walker for the last 3 weeks  She is sore but the ankle feels stable and the foot is plantigrade  Recommend 3 more weeks in the boot  X-ray shows no new fractures  Return to work 17 August 2023 consider knee walker for ambulation

## 2023-08-21 ENCOUNTER — Ambulatory Visit: Payer: Medicaid Other | Admitting: Obstetrics & Gynecology

## 2023-08-31 ENCOUNTER — Ambulatory Visit: Payer: Medicaid Other | Admitting: Family Medicine

## 2023-09-04 ENCOUNTER — Encounter: Payer: Self-pay | Admitting: Orthopedic Surgery

## 2023-09-04 ENCOUNTER — Ambulatory Visit (INDEPENDENT_AMBULATORY_CARE_PROVIDER_SITE_OTHER): Payer: Medicaid Other | Admitting: Orthopedic Surgery

## 2023-09-04 DIAGNOSIS — S92151D Displaced avulsion fracture (chip fracture) of right talus, subsequent encounter for fracture with routine healing: Secondary | ICD-10-CM

## 2023-09-04 NOTE — Progress Notes (Signed)
Chief Complaint  Patient presents with   Follow-up    Recheck on right foot, DOI 07/21/23

## 2023-09-04 NOTE — Patient Instructions (Signed)
Physical therapy has been ordered for you at [place]. They should call you to schedule, [phone] is the phone number to call, if you want to call to schedule.   

## 2023-09-04 NOTE — Progress Notes (Signed)
Recheck right ankle injury  Tamar dorsal avulsion and ankle sprain  Currently m walker and scooter  Still sore   Ankle exam stable but ATFL tenderness, ROM a little tight on DF  I think she needs a little therapy to speed things along  Start therapy return in 6 weeks recheck ankle continue CAM Walker scooter but try to wean yourself off the scooter were my  instructions  Encounter Diagnosis  Name Primary?   Closed displaced avulsion fracture of right talus with routine healing, subsequent encounter 07/21/23 Yes

## 2023-09-18 ENCOUNTER — Other Ambulatory Visit: Payer: Self-pay

## 2023-09-18 ENCOUNTER — Ambulatory Visit (HOSPITAL_COMMUNITY): Payer: Medicaid Other | Attending: Orthopedic Surgery

## 2023-09-18 DIAGNOSIS — M25571 Pain in right ankle and joints of right foot: Secondary | ICD-10-CM | POA: Diagnosis present

## 2023-09-18 DIAGNOSIS — S92151D Displaced avulsion fracture (chip fracture) of right talus, subsequent encounter for fracture with routine healing: Secondary | ICD-10-CM | POA: Diagnosis not present

## 2023-09-18 DIAGNOSIS — R262 Difficulty in walking, not elsewhere classified: Secondary | ICD-10-CM | POA: Insufficient documentation

## 2023-09-18 DIAGNOSIS — M6281 Muscle weakness (generalized): Secondary | ICD-10-CM | POA: Insufficient documentation

## 2023-09-18 NOTE — Therapy (Signed)
[facility] OUTPATIENT PHYSICAL THERAPY EVALUATION (LOWER EXTREMITY)   Patient Name: Sandra Glover MRN: [id number] DOB:29-Dec-1985, 37 y.o., female Today's Date: 09/18/2023  END OF SESSION:   PT End of Session - 09/18/23 1524     Visit Number 1    Number of Visits 10    Authorization Type [employer] [employer])    Authorization Time Period Auth requested 10/14    PT Start Time 0230    PT Stop Time 0315    PT Time Calculation (min) 45 min    Equipment Utilized During Treatment Other (comment)   knee scooter   Activity Tolerance Patient tolerated treatment well    Behavior During Therapy WFL for tasks assessed/performed              Past Medical History:  Diagnosis Date   Anxiety    Anxiety    Arthritis    Depression    History of anemia 2013   History of depression    states stopped med. in 2013   History of partial nephrectomy age 24 mos.   right - due to kidney infection   Hypertension    Osteopenia    Pertussis    Postcoital bleeding 07/14/2015   Sinus infection 01/25/2013   started antibiotic 01/24/2013 x 10 days; nasal congestion, clear drainage from nose   Tonsillar and adenoid hypertrophy 01/2013   Vaginal irritation 03/25/2014   Mild yeast but has history of yeast, will not treat til after first trimester,try yogurt   Past Surgical History:  Procedure Laterality Date   CHOLECYSTECTOMY  04/23/2010   laparoscopic   COLONOSCOPY WITH PROPOFOL N/A 05/17/2016   Procedure: COLONOSCOPY WITH PROPOFOL;  Surgeon: West Bali, MD;  [facility];  Service: Endoscopy;  Laterality: N/A;  1000   HEMORRHOID BANDING N/A 05/17/2016   Procedure: HEMORRHOID BANDING;  Surgeon: West Bali, MD;  [facility];  Service: Endoscopy;  Laterality: N/A;   HEMORRHOID SURGERY N/A 06/08/2016   Procedure: SIMPLE HEMORRHOIDECTOMY;  Surgeon: Ancil Linsey, MD;  Location: [facility];  Service: General;  Laterality: N/A;   PARTIAL NEPHRECTOMY Right age 20 mos.    TONSILLECTOMY AND ADENOIDECTOMY N/A 01/29/2013   Procedure: TONSILLECTOMY AND ADENOIDECTOMY;  Surgeon: Darletta Moll, MD;  Location: [facility];  Service: ENT;  Laterality: N/A;   Patient Active Problem List   Diagnosis Date Noted   History of anemia 04/25/2023   Neck pain 04/25/2023   ADHD (attention deficit hyperactivity disorder) 04/25/2023   Tachycardia 11/10/2021   Bipolar 1 disorder (HCC) 11/10/2021   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   Acute sinusitis 06/28/2021   Major depressive disorder, recurrent episode, moderate (HCC) 06/22/2021   Pain in right arm 03/30/2021   Umbilical hernia 03/23/2021   Bipolar 1 disorder with moderate mania (HCC) 02/15/2021   Obesity 11/08/2020   Hyperlipidemia 11/08/2020   Osteoarthritis 08/05/2020   Hypertension 05/05/2020   Osteopenia 04/04/2020   Headache 12/19/2019   History of partial nephrectomy 12/01/2019   Exposure to 2019 novel coronavirus 06/06/2019   Adult ADHD 04/05/2019   Diarrhea 12/26/2018   Abdominal pain 12/26/2018   Irritable bowel syndrome 11/21/2018   Attention deficit hyperactivity disorder, 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   Anxiety 11/21/2018   Nexplanon insertion 09/25/2017   Hemorrhoids 09/19/2016   Rectal bleeding 03/24/2016   Constipation 03/24/2016   History of preterm delivery 09/09/2014   Pertussis 07/30/2014   History of gestational diabetes 07/24/2014   Cystic fibrosis carrier, 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  Depression with anxiety 03/03/2014    PCP: Gilmore Laroche, FNPPCP - General   REFERRING PROVIDER: Vickki Hearing, MD   REFERRING DIAG: [phone] (ICD-10-CM) - Closed displaced avulsion fracture of right talus with routine healing, subsequent encounter  Rationale for Evaluation and Treatment: Rehabilitation  THERAPY DIAG:  No diagnosis found.  ONSET DATE: Jul 21, 2023  11/13/2023   --------------------------------------------------------------------------------------------- SUBJECTIVE:                                                                                                                                                                                           SUBJECTIVE STATEMENT: On 07/21/23, patient was playing with her dog and stepped in a hole and heard her right foot "crack/pop". Patient went to ED same day; XR to show + right Avulsion fracture. Pt went to Dr. Alto Denver 07/25/23. Patient was put into Aircast and NWB for 4 weeks. Patient went ot MD on 08/14/23 for follow-up Xray avulsion fracture fragment still visible. Patient returned to work 9/12 with + Scooter, light duty. On 09/04/23, MD referred pt to OPPT, partial weightbearing in cast. PT cleared to begin WB when starting OPT.   PERTINENT HISTORY:  Right Arm surgery Pelvic Pain- Rectocele, Cystocele (had surgery scheduled) Right hip   PAIN:  Are you having pain? Yes: NPRS scale: 2/10 Pain location: right ankle/ right hip Pain description: dull, soreness Aggravating factors: squats, walking Relieving factors: rest  PRECAUTIONS: None  RED FLAGS: None   WEIGHT BEARING RESTRICTIONS: No; cleared to begin WB during PT   FALLS:  Has patient fallen in last 6 months? No  LIVING ENVIRONMENT: Lives with: lives with their family and lives with their spouse Lives in: House/apartment Stairs:  3 level home; 2 sets of stairs  Has following equipment at home:  scooter,   OCCUPATION: Office manager- sedentary   PLOF: Independent  PATIENT GOALS: To walk pain free  NEXT MD VISIT: 10/16/23 --------------------------------------------------------------------------------------------- OBJECTIVE:   DIAGNOSTIC FINDINGS:  Right foot 3 views   Talar avulsion fracture   X-rays taken to see if there are any new fractures   No new fractures are noted avulsion fracture fragment still visible    Impression avulsion fracture dorsal talus no additional fractures     SCREENING FOR RED FLAGS: Bowel or bladder incontinence: No Spinal tumors: No Cauda equina syndrome: No Compression fracture: No Abdominal aneurysm: No  COGNITION: Overall cognitive status: Within functional limits for tasks assessed  POSTURE: No Significant postural limitations      FUNCTIONAL TESTS:  2 minute walk test: NEXT SESSION   GAIT ANALYSIS: Distance walked: 25' Assistive device utilized: None Level of  assistance: Complete Independence Comments: Patient with minimal antalgia on right foot due to + AirCast  SENSATION: WFL    LOWER EXTREMITY MMT:    MMT Right eval Left eval  Hip flexion 3+ 4-  Hip extension 3+ 3+  Hip abduction 4- 4-  Hip adduction    Hip internal rotation    Hip external rotation    Knee flexion    Knee extension    Ankle dorsiflexion 3-   Ankle plantarflexion 3-   Ankle inversion 3-   Ankle eversion 3-    (Blank rows = not tested)  LOWER EXTREMITY ROM:     Active  Right eval Left eval  Hip flexion    Hip extension    Hip abduction    Hip adduction    Hip internal rotation    Hip external rotation    Knee flexion    Knee extension    Ankle dorsiflexion 0 pain   Ankle plantarflexion 0-35   Ankle inversion WFL   Ankle eversion 0-5 pain     (Blank rows = not tested)   PALPATION: MOD tenderness to palpation right Ankle( lateral/medial aspect)  INTEGUMENTARY   Minimal swelling/edema right lateral ankle  --------------------------------------------------------------------------------------------- TODAY'S TREATMENT:                                                                                                                              DATE:   09/18/23 *DO next session Pt Initial Eval  Therapeutic exercise  Long Sitting Ankle Eversion with Resistance  20 reps Long Sitting Ankle Inversion with Resistance  20 reps   PATIENT EDUCATION:   Education details: HEP Person educated: Patient Education method: Medical illustrator Education comprehension: verbalized understanding and returned demonstration  HOME EXERCISE PROGRAM: Access Code: [email or web address] URL: [email or web address] Date: 09/18/2023 Prepared by: Seymour Bars  Exercises - Long Sitting Ankle Eversion with Resistance  - 2-3 x daily - 20 reps - Long Sitting Ankle Inversion with Resistance  - 2-3 x daily - 20 reps --------------------------------------------------------------------------------------------- ASSESSMENT:  CLINICAL IMPRESSION: Patient is a 37 y.o. y.o. female who was seen today for physical therapy evaluation and treatment for diffciulty walking, right ankle pain, weakness s/p right talus avulsion fracture in 07/2023- non surgical. Patient presents to PT with the following objective impairments: Abnormal gait, decreased balance, decreased endurance, difficulty walking, decreased strength, hypomobility, impaired flexibility, and pain. These impairments limit the patient in activities such as carrying, lifting, standing, squatting, stairs, transfers, and locomotion level. These impairments also limit the patient in participation such as meal prep, cleaning, laundry, personal finances, interpersonal relationship, driving, shopping, community activity, occupation, and yard work. The patient will benefit from PT to address the limitations/impairments listed below to return to their prior level of function in the domains of activity and participation.    PERSONAL FACTORS:  n/a  are also affecting patient's functional outcome.   REHAB POTENTIAL: Excellent  CLINICAL DECISION MAKING: Stable/uncomplicated  EVALUATION COMPLEXITY:  Low  --------------------------------------------------------------------------------------------- GOALS: Goals reviewed with patient? No  SHORT TERM GOALS: Target date: 10/16/2023    1.  Patient will improve  right Ankle Eversion/dorsiflexion by 5 degrees to demonstrate an improvement in ADL completion, stair negotiation, household/community ambulation, and self-care Baseline:  Goal status: INITIAL  3. Patient will be independent with a basic stretching/strengthening HEP  Baseline:  Goal status: INITIAL   LONG TERM GOALS: Target date: 11/13/2023     Patient will improve right Ankle Eversion/dorsiflexion to be [facility] to demonstrate an improvement in ADL completion, stair negotiation, household/community ambulation, and self-care Baseline:  Goal status: INITIAL  2.  Patient will be able to walk with no AD with 0/10 pain to facilitate community ambulation  Baseline:  Goal status: INITIAL  3.  Patient will be independent with a comprehensive strengthening HEP  Baseline:  Goal status: INITIAL  --------------------------------------------------------------------------------------------- PLAN:  PT FREQUENCY: 1-2x/week  PT DURATION: 8 weeks  PLANNED INTERVENTIONS: 97110-Therapeutic exercises, 97530- Therapeutic activity, 97112- Neuromuscular re-education, 97535- Self Care, 16109- Manual therapy, [id number]- Gait training, Balance training, Stair training, Taping, Dry Needling, Joint mobilization, Joint manipulation, Spinal manipulation, Spinal mobilization, Cryotherapy, and Moist heat.  PLAN FOR NEXT SESSION: Introduce lower extremity strength, more weightbearing    Seymour Bars, PT 09/18/2023, 3:27 PM

## 2023-09-21 ENCOUNTER — Ambulatory Visit (HOSPITAL_COMMUNITY): Payer: Medicaid Other

## 2023-09-21 DIAGNOSIS — M25571 Pain in right ankle and joints of right foot: Secondary | ICD-10-CM

## 2023-09-21 DIAGNOSIS — M6281 Muscle weakness (generalized): Secondary | ICD-10-CM

## 2023-09-21 DIAGNOSIS — R262 Difficulty in walking, not elsewhere classified: Secondary | ICD-10-CM

## 2023-09-21 NOTE — Therapy (Signed)
[facility] OUTPATIENT PHYSICAL THERAPY TREATMENT   Patient Name: Sandra Glover MRN: [id number] DOB:1986-10-13, 37 y.o., female Today's Date: 09/21/2023  END OF SESSION:   PT End of Session - 09/21/23 0900     Visit Number 2    Number of Visits 10    Authorization Type [employer] ([place] Medicaid)    Authorization Time Period 10/14-12/13    Authorization - Visit Number 2    Authorization - Number of Visits 12    PT Start Time 0815   arrived late   PT Stop Time 0845    PT Time Calculation (min) 30 min    Equipment Utilized During Treatment Other (comment)   knee scooter   Activity Tolerance Patient tolerated treatment well    Behavior During Therapy WFL for tasks assessed/performed              Past Medical History:  Diagnosis Date   Anxiety    Anxiety    Arthritis    Depression    History of anemia 2013   History of depression    states stopped med. in 2013   History of partial nephrectomy age 36 mos.   right - due to kidney infection   Hypertension    Osteopenia    Pertussis    Postcoital bleeding 07/14/2015   Sinus infection 01/25/2013   started antibiotic 01/24/2013 x 10 days; nasal congestion, clear drainage from nose   Tonsillar and adenoid hypertrophy 01/2013   Vaginal irritation 03/25/2014   Mild yeast but has history of yeast, will not treat til after first trimester,try yogurt   Past Surgical History:  Procedure Laterality Date   CHOLECYSTECTOMY  04/23/2010   laparoscopic   COLONOSCOPY WITH PROPOFOL N/A 05/17/2016   Procedure: COLONOSCOPY WITH PROPOFOL;  Surgeon: West Bali, MD;  Location: [facility];  Service: Endoscopy;  Laterality: N/A;  1000   HEMORRHOID BANDING N/A 05/17/2016   Procedure: HEMORRHOID BANDING;  Surgeon: West Bali, MD;  Location: [facility];  Service: Endoscopy;  Laterality: N/A;   HEMORRHOID SURGERY N/A 06/08/2016   Procedure: SIMPLE HEMORRHOIDECTOMY;  Surgeon: Ancil Linsey, MD;  Location: [facility];  Service: General;   Laterality: N/A;   PARTIAL NEPHRECTOMY Right age 24 mos.   TONSILLECTOMY AND ADENOIDECTOMY N/A 01/29/2013   Procedure: TONSILLECTOMY AND ADENOIDECTOMY;  Surgeon: Darletta Moll, MD;  Location: [facility];  Service: ENT;  Laterality: N/A;   Patient Active Problem List   Diagnosis Date Noted   History of anemia 04/25/2023   Neck pain 04/25/2023   ADHD (attention deficit hyperactivity disorder) 04/25/2023   Tachycardia 11/10/2021   Bipolar 1 disorder (HCC) 11/10/2021   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   Acute sinusitis 06/28/2021   Major depressive disorder, recurrent episode, moderate (HCC) 06/22/2021   Pain in right arm 03/30/2021   Umbilical hernia 03/23/2021   Bipolar 1 disorder with moderate mania (HCC) 02/15/2021   Obesity 11/08/2020   Hyperlipidemia 11/08/2020   Osteoarthritis 08/05/2020   Hypertension 05/05/2020   Osteopenia 04/04/2020   Headache 12/19/2019   History of partial nephrectomy 12/01/2019   Exposure to 2019 novel coronavirus 06/06/2019   Adult ADHD 04/05/2019   Diarrhea 12/26/2018   Abdominal pain 12/26/2018   Irritable bowel syndrome 11/21/2018   Attention deficit hyperactivity disorder, 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   Anxiety 11/21/2018   Nexplanon insertion 09/25/2017   Hemorrhoids 09/19/2016   Rectal bleeding 03/24/2016   Constipation 03/24/2016   History of preterm delivery 09/09/2014  Pertussis 07/30/2014   History of gestational diabetes 07/24/2014   Cystic fibrosis carrier, 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   Depression with anxiety 03/03/2014    PCP: Gilmore Laroche, FNPPCP - General   REFERRING PROVIDER: Vickki Hearing, MD   REFERRING DIAG: [id number] (ICD-10-CM) - Closed displaced avulsion fracture of right talus with routine healing, subsequent encounter  Rationale for Evaluation and Treatment: Rehabilitation  THERAPY DIAG:  Difficulty in walking, not elsewhere classified  Pain in right ankle and joints of right  foot  Muscle weakness (generalized)  ONSET DATE: Jul 21, 2023   --------------------------------------------------------------------------------------------- SUBJECTIVE:                                                                                                                                                                                           SUBJECTIVE STATEMENT: Patient with minimal pain today; 2/10    IE: On 07/21/23, patient was playing with her dog and stepped in a hole and heard her right foot "crack/pop". Patient went to ED same day; XR to show + right Avulsion fracture. Pt went to Dr. Alto Denver 07/25/23. Patient was put into Aircast and NWB for 4 weeks. Patient went ot MD on 08/14/23 for follow-up Xray avulsion fracture fragment still visible. Patient returned to work 9/12 with + Scooter, light duty. On 09/04/23, MD referred pt to OPPT, partial weightbearing in cast. PT cleared to begin WB when starting OPT.   PERTINENT HISTORY:  Right Arm surgery Pelvic Pain- Rectocele, Cystocele (had surgery scheduled) Right hip   PAIN:  Are you having pain? Yes: NPRS scale: 2/10 Pain location: right ankle/ right hip Pain description: dull, soreness Aggravating factors: squats, walking Relieving factors: rest  PRECAUTIONS: None  RED FLAGS: None   WEIGHT BEARING RESTRICTIONS: No; cleared to begin WB during PT   FALLS:  Has patient fallen in last 6 months? No  LIVING ENVIRONMENT: Lives with: lives with their family and lives with their spouse Lives in: House/apartment Stairs:  3 level home; 2 sets of stairs  Has following equipment at home:  scooter,   OCCUPATION: Office manager- sedentary   PLOF: Independent  PATIENT GOALS: To walk pain free  NEXT MD VISIT: 10/16/23 --------------------------------------------------------------------------------------------- OBJECTIVE:   DIAGNOSTIC FINDINGS:  Right foot 3 views   Talar avulsion fracture   X-rays taken to see if there  are any new fractures   No new fractures are noted avulsion fracture fragment still visible   Impression avulsion fracture dorsal talus no additional fractures     SCREENING FOR RED FLAGS: Bowel or bladder incontinence: No Spinal tumors: No Cauda equina syndrome: No Compression fracture: No Abdominal aneurysm: No  COGNITION: Overall cognitive status: Within functional limits for tasks assessed  POSTURE: No Significant postural limitations      FUNCTIONAL TESTS:  2 minute walk test: NEXT SESSION   GAIT ANALYSIS: Distance walked: 25' Assistive device utilized: None Level of assistance: Complete Independence Comments: Patient with minimal antalgia on right foot due to + AirCast  SENSATION: WFL    LOWER EXTREMITY MMT:    MMT Right eval Left eval  Hip flexion 3+ 4-  Hip extension 3+ 3+  Hip abduction 4- 4-  Hip adduction    Hip internal rotation    Hip external rotation    Knee flexion    Knee extension    Ankle dorsiflexion 3-   Ankle plantarflexion 3-   Ankle inversion 3-   Ankle eversion 3-    (Blank rows = not tested)  LOWER EXTREMITY ROM:     Active  Right eval Left eval  Hip flexion    Hip extension    Hip abduction    Hip adduction    Hip internal rotation    Hip external rotation    Knee flexion    Knee extension    Ankle dorsiflexion 0 pain   Ankle plantarflexion 0-35   Ankle inversion WFL   Ankle eversion 0-5 pain     (Blank rows = not tested)   PALPATION: MOD tenderness to palpation right Ankle( lateral/medial aspect)  INTEGUMENTARY   Minimal swelling/edema right lateral ankle  --------------------------------------------------------------------------------------------- TODAY'S TREATMENT:                                                                                                                              DATE:    09/21/23 -Supine joint mobilizations Gr 3-4 to right TCJ  -Supine right ankle PROM/ Isometrics into PT  resistance -Standing onto 2" step  Forward leans to increase weightbearing   Step ups/downs    09/18/23 *DO next session Pt Initial Eval  Therapeutic exercise  Long Sitting Ankle Eversion with Resistance  20 reps Long Sitting Ankle Inversion with Resistance  20 reps   PATIENT EDUCATION:  Education details: HEP Person educated: Patient Education method: Medical illustrator Education comprehension: verbalized understanding and returned demonstration  HOME EXERCISE PROGRAM: Access Code: [email or web address] URL: [email or web address] Date: 09/18/2023 Prepared by: Seymour Bars  Exercises - Long Sitting Ankle Eversion with Resistance  - 2-3 x daily - 20 reps - Long Sitting Ankle Inversion with Resistance  - 2-3 x daily - 20 reps --------------------------------------------------------------------------------------------- ASSESSMENT:  CLINICAL IMPRESSION:  Today: Patient arrives to clinic with + CAM Boot, no scooter. PT focused session on right ankle joint mobility to improve ROM followed by introducing weightbearing onto step. Patient is able to tolerate 100% weightbearing with no pain on right lower extremity. Patient will continue to benefit from PT to return to prior level of function   IE: Patient is a 37 y.o. y.o. female who was seen today for physical therapy evaluation and  treatment for diffciulty walking, right ankle pain, weakness s/p right talus avulsion fracture in 07/2023- non surgical. Patient presents to PT with the following objective impairments: Abnormal gait, decreased balance, decreased endurance, difficulty walking, decreased strength, hypomobility, impaired flexibility, and pain. These impairments limit the patient in activities such as carrying, lifting, standing, squatting, stairs, transfers, and locomotion level. These impairments also limit the patient in participation such as meal prep, cleaning, laundry, personal finances, interpersonal  relationship, driving, shopping, community activity, occupation, and yard work. The patient will benefit from PT to address the limitations/impairments listed below to return to their prior level of function in the domains of activity and participation.    PERSONAL FACTORS:  n/a  are also affecting patient's functional outcome.   REHAB POTENTIAL: Excellent  CLINICAL DECISION MAKING: Stable/uncomplicated  EVALUATION COMPLEXITY: Low  --------------------------------------------------------------------------------------------- GOALS: Goals reviewed with patient? No  SHORT TERM GOALS: Target date: 10/16/2023    1.  Patient will improve right Ankle Eversion/dorsiflexion by 5 degrees to demonstrate an improvement in ADL completion, stair negotiation, household/community ambulation, and self-care Baseline:  Goal status: INITIAL  3. Patient will be independent with a basic stretching/strengthening HEP  Baseline:  Goal status: INITIAL   LONG TERM GOALS: Target date: 11/13/2023     Patient will improve right Ankle Eversion/dorsiflexion to be [facility] to demonstrate an improvement in ADL completion, stair negotiation, household/community ambulation, and self-care Baseline:  Goal status: INITIAL  2.  Patient will be able to walk with no AD with 0/10 pain to facilitate community ambulation  Baseline:  Goal status: INITIAL  3.  Patient will be independent with a comprehensive strengthening HEP  Baseline:  Goal status: INITIAL  --------------------------------------------------------------------------------------------- PLAN:  PT FREQUENCY: 1-2x/week  PT DURATION: 8 weeks  PLANNED INTERVENTIONS: 97110-Therapeutic exercises, 97530- Therapeutic activity, 97112- Neuromuscular re-education, 97535- Self Care, 24401- Manual therapy, [phone]- Gait training, Balance training, Stair training, Taping, Dry Needling, Joint mobilization, Joint manipulation, Spinal manipulation, Spinal mobilization,  Cryotherapy, and Moist heat.  PLAN FOR NEXT SESSION: Introduce lower extremity strength, more weightbearing    Seymour Bars, PT 09/21/2023, 9:07 AM

## 2023-09-25 ENCOUNTER — Ambulatory Visit (HOSPITAL_COMMUNITY): Payer: Medicaid Other

## 2023-09-25 DIAGNOSIS — R262 Difficulty in walking, not elsewhere classified: Secondary | ICD-10-CM | POA: Diagnosis not present

## 2023-09-25 DIAGNOSIS — M6281 Muscle weakness (generalized): Secondary | ICD-10-CM

## 2023-09-25 DIAGNOSIS — M25571 Pain in right ankle and joints of right foot: Secondary | ICD-10-CM

## 2023-09-25 NOTE — Therapy (Signed)
[facility] OUTPATIENT PHYSICAL THERAPY TREATMENT   Patient Name: Sandra Glover MRN: [id number] DOB:1986/04/21, 37 y.o., female Today's Date: 09/25/2023  END OF SESSION:   PT End of Session - 09/25/23 0933     Visit Number 3    Number of Visits 10    Authorization Type [employer] ([place] Medicaid)    Authorization Time Period 10/14-12/13    Authorization - Visit Number 3    Authorization - Number of Visits 12    PT Start Time 0930    PT Stop Time 1010    PT Time Calculation (min) 40 min    Equipment Utilized During Treatment Other (comment)   knee scooter   Activity Tolerance Patient tolerated treatment well    Behavior During Therapy WFL for tasks assessed/performed              Past Medical History:  Diagnosis Date   Anxiety    Anxiety    Arthritis    Depression    History of anemia 2013   History of depression    states stopped med. 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   right - due to kidney infection   Hypertension    Osteopenia    Pertussis    Postcoital bleeding 07/14/2015   Sinus infection 01/25/2013   started antibiotic 01/24/2013 x 10 days; nasal congestion, clear drainage from nose   Tonsillar and adenoid hypertrophy 01/2013   Vaginal irritation 03/25/2014   Mild yeast but has history of yeast, will not treat til after first trimester,try yogurt   Past Surgical History:  Procedure Laterality Date   CHOLECYSTECTOMY  04/23/2010   laparoscopic   COLONOSCOPY WITH PROPOFOL N/A 05/17/2016   Procedure: COLONOSCOPY WITH PROPOFOL;  Surgeon: West Bali, MD;  Location: [facility];  Service: Endoscopy;  Laterality: N/A;  1000   HEMORRHOID BANDING N/A 05/17/2016   Procedure: HEMORRHOID BANDING;  Surgeon: West Bali, MD;  [facility]: [facility];  Service: Endoscopy;  Laterality: N/A;   HEMORRHOID SURGERY N/A 06/08/2016   Procedure: SIMPLE HEMORRHOIDECTOMY;  Surgeon: Ancil Linsey, MD;  Location: [facility];  Service: General;  Laterality: N/A;    PARTIAL NEPHRECTOMY Right age 71 mos.   TONSILLECTOMY AND ADENOIDECTOMY N/A 01/29/2013   Procedure: TONSILLECTOMY AND ADENOIDECTOMY;  Surgeon: Darletta Moll, MD;  Location: [facility];  Service: ENT;  Laterality: N/A;   Patient Active Problem List   Diagnosis Date Noted   History of anemia 04/25/2023   Neck pain 04/25/2023   ADHD (attention deficit hyperactivity disorder) 04/25/2023   Tachycardia 11/10/2021   Bipolar 1 disorder (HCC) 11/10/2021   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   Acute sinusitis 06/28/2021   Major depressive disorder, recurrent episode, moderate (HCC) 06/22/2021   Pain in right arm 03/30/2021   Umbilical hernia 03/23/2021   Bipolar 1 disorder with moderate mania (HCC) 02/15/2021   Obesity 11/08/2020   Hyperlipidemia 11/08/2020   Osteoarthritis 08/05/2020   Hypertension 05/05/2020   Osteopenia 04/04/2020   Headache 12/19/2019   History of partial nephrectomy 12/01/2019   Exposure to 2019 novel coronavirus 06/06/2019   Adult ADHD 04/05/2019   Diarrhea 12/26/2018   Abdominal pain 12/26/2018   Irritable bowel syndrome 11/21/2018   Attention deficit hyperactivity disorder, 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   Anxiety 11/21/2018   Nexplanon insertion 09/25/2017   Hemorrhoids 09/19/2016   Rectal bleeding 03/24/2016   Constipation 03/24/2016   History of preterm delivery 09/09/2014   Pertussis 07/30/2014  History of gestational diabetes 07/24/2014   Cystic fibrosis carrier, 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   Depression with anxiety 03/03/2014    PCP: Gilmore Laroche, FNPPCP - General   REFERRING PROVIDER: Vickki Hearing, MD   REFERRING DIAG: [phone] (ICD-10-CM) - Closed displaced avulsion fracture of right talus with routine healing, subsequent encounter  Rationale for Evaluation and Treatment: Rehabilitation  THERAPY DIAG:  Difficulty in walking, not elsewhere classified  Pain in right ankle and joints of right foot  Muscle weakness  (generalized)  ONSET DATE: Jul 21, 2023   --------------------------------------------------------------------------------------------- SUBJECTIVE:                                                                                                                                                                                           SUBJECTIVE STATEMENT: Patient with minimal pain today; 2/10. Patient uses scooter at work, weaning off boot at home    IE: On 07/21/23, patient was playing with her dog and stepped in a hole and heard her right foot "crack/pop". Patient went to ED same day; XR to show + right Avulsion fracture. Pt went to Dr. Alto Denver 07/25/23. Patient was put into Aircast and NWB for 4 weeks. Patient went ot MD on 08/14/23 for follow-up Xray avulsion fracture fragment still visible. Patient returned to work 9/12 with + Scooter, light duty. On 09/04/23, MD referred pt to OPPT, partial weightbearing in cast. PT cleared to begin WB when starting OPT.   PERTINENT HISTORY:  Right Arm surgery Pelvic Pain- Rectocele, Cystocele (had surgery scheduled) Right hip   PAIN:  Are you having pain? Yes: NPRS scale: 2/10 Pain location: right ankle/ right hip Pain description: dull, soreness Aggravating factors: squats, walking Relieving factors: rest  PRECAUTIONS: None  RED FLAGS: None   WEIGHT BEARING RESTRICTIONS: No; cleared to begin WB during PT   FALLS:  Has patient fallen in last 6 months? No  LIVING ENVIRONMENT: Lives with: lives with their family and lives with their spouse Lives in: House/apartment Stairs:  3 level home; 2 sets of stairs  Has following equipment at home:  scooter,   OCCUPATION: Office manager- sedentary   PLOF: Independent  PATIENT GOALS: To walk pain free  NEXT MD VISIT: 10/16/23 --------------------------------------------------------------------------------------------- OBJECTIVE:   DIAGNOSTIC FINDINGS:  Right foot 3 views   Talar avulsion  fracture   X-rays taken to see if there are any new fractures   No new fractures are noted avulsion fracture fragment still visible   Impression avulsion fracture dorsal talus no additional fractures     SCREENING FOR RED FLAGS: Bowel or bladder incontinence: No Spinal tumors: No Cauda equina syndrome: No Compression  fracture: No Abdominal aneurysm: No  COGNITION: Overall cognitive status: Within functional limits for tasks assessed  POSTURE: No Significant postural limitations         GAIT ANALYSIS: Distance walked: 25' Assistive device utilized: None Level of assistance: Complete Independence Comments: Patient with minimal antalgia on right foot due to + AirCast  SENSATION: WFL    LOWER EXTREMITY MMT:    MMT Right eval Left eval  Hip flexion 3+ 4-  Hip extension 3+ 3+  Hip abduction 4- 4-  Hip adduction    Hip internal rotation    Hip external rotation    Knee flexion    Knee extension    Ankle dorsiflexion 3-   Ankle plantarflexion 3-   Ankle inversion 3-   Ankle eversion 3-    (Blank rows = not tested)  LOWER EXTREMITY ROM:     Active  Right eval Left eval  Hip flexion    Hip extension    Hip abduction    Hip adduction    Hip internal rotation    Hip external rotation    Knee flexion    Knee extension    Ankle dorsiflexion 0 pain   Ankle plantarflexion 0-35   Ankle inversion WFL   Ankle eversion 0-5 pain     (Blank rows = not tested)   PALPATION: MOD tenderness to palpation right Ankle( lateral/medial aspect)  INTEGUMENTARY   Minimal swelling/edema right lateral ankle  --------------------------------------------------------------------------------------------- TODAY'S TREATMENT:                                                                                                                              DATE:   09/25/23  Runner's stretch on wall Weight shifts onto right lower extremity on 2" step in //bars Step up/ step downs  2" step in // bars Gait Training around clinic with single point cane Cool down on bike L2     09/21/23 -Supine joint mobilizations Gr 3-4 to right TCJ  -Supine right ankle PROM/ Isometrics into PT resistance -Standing onto 2" step  Forward leans to increase weightbearing   Step ups/downs    09/18/23 *DO next session Pt Initial Eval  Therapeutic exercise  Long Sitting Ankle Eversion with Resistance  20 reps Long Sitting Ankle Inversion with Resistance  20 reps   PATIENT EDUCATION:  Education details: HEP Person educated: Patient Education method: Medical illustrator Education comprehension: verbalized understanding and returned demonstration  HOME EXERCISE PROGRAM: Access Code: [email or web address] URL: [email or web address] Date: 09/25/2023 Prepared by: Seymour Bars  Exercises - Long Sitting Ankle Eversion with Resistance  - 1-2 x daily - 20 reps - Long Sitting Ankle Inversion with Resistance  - 1-2 x daily - 20 reps - Gastroc Stretch on Wall  - 1-2 x daily - 10 reps - 10 secs hold - Standing Heel Raise with Support  - 1-2 x daily - 20 reps - 3 secs hold --------------------------------------------------------------------------------------------- ASSESSMENT:  CLINICAL IMPRESSION: Today:  Patient arrives to clinic with + CAM Boot, no scooter. PT focused session on right ankle joint mobility, closed kinematic chain dorsiflexion to improve AROM followed by Gait Training with single point cane, 2 pt step through. Patient is able to tolerate 100% weightbearing with no pain on right lower extremity. Patient will continue to benefit from PT to return to prior level of function   IE: Patient is a 37 y.o. y.o. female who was seen today for physical therapy evaluation and treatment for diffciulty walking, right ankle pain, weakness s/p right talus avulsion fracture in 07/2023- non surgical. Patient presents to PT with the following objective impairments: Abnormal  gait, decreased balance, decreased endurance, difficulty walking, decreased strength, hypomobility, impaired flexibility, and pain. These impairments limit the patient in activities such as carrying, lifting, standing, squatting, stairs, transfers, and locomotion level. These impairments also limit the patient in participation such as meal prep, cleaning, laundry, personal finances, interpersonal relationship, driving, shopping, community activity, occupation, and yard work. The patient will benefit from PT to address the limitations/impairments listed below to return to their prior level of function in the domains of activity and participation.    PERSONAL FACTORS:  n/a  are also affecting patient's functional outcome.   REHAB POTENTIAL: Excellent  CLINICAL DECISION MAKING: Stable/uncomplicated  EVALUATION COMPLEXITY: Low  --------------------------------------------------------------------------------------------- GOALS: Goals reviewed with patient? No  SHORT TERM GOALS: Target date: 10/16/2023    1.  Patient will improve right Ankle Eversion/dorsiflexion by 5 degrees to demonstrate an improvement in ADL completion, stair negotiation, household/community ambulation, and self-care Baseline:  Goal status: INITIAL  3. Patient will be independent with a basic stretching/strengthening HEP  Baseline:  Goal status: INITIAL   LONG TERM GOALS: Target date: 11/13/2023     Patient will improve right Ankle Eversion/dorsiflexion to be [facility] to demonstrate an improvement in ADL completion, stair negotiation, household/community ambulation, and self-care Baseline:  Goal status: INITIAL  2.  Patient will be able to walk with no AD with 0/10 pain to facilitate community ambulation  Baseline:  Goal status: INITIAL  3.  Patient will be independent with a comprehensive strengthening HEP  Baseline:  Goal status:  INITIAL  --------------------------------------------------------------------------------------------- PLAN:  PT FREQUENCY: 1-2x/week  PT DURATION: 8 weeks  PLANNED INTERVENTIONS: 97110-Therapeutic exercises, 97530- Therapeutic activity, 97112- Neuromuscular re-education, 97535- Self Care, 16109- Manual therapy, [phone]- Gait training, Balance training, Stair training, Taping, Dry Needling, Joint mobilization, Joint manipulation, Spinal manipulation, Spinal mobilization, Cryotherapy, and Moist heat.  PLAN FOR NEXT SESSION: Introduce lower extremity strength, more weightbearing    Seymour Bars, PT 09/25/2023, 9:33 AM

## 2023-09-26 ENCOUNTER — Encounter: Payer: Self-pay | Admitting: Orthopedic Surgery

## 2023-09-28 ENCOUNTER — Encounter (HOSPITAL_COMMUNITY): Payer: Medicaid Other

## 2023-09-28 ENCOUNTER — Ambulatory Visit
Admission: EM | Admit: 2023-09-28 | Discharge: 2023-09-28 | Disposition: A | Discharge status: Home / Self Care | Payer: Medicaid Other | Attending: Family Medicine | Admitting: Family Medicine

## 2023-09-28 DIAGNOSIS — J069 Acute upper respiratory infection, unspecified: Secondary | ICD-10-CM

## 2023-09-28 MED ORDER — PROMETHAZINE-DM 6.25-15 MG/5ML PO SYRP: 5.0000 mL | ORAL_SOLUTION | Freq: Four times a day (QID) | ORAL | 0 refills | Status: DC | PRN

## 2023-09-28 NOTE — ED Provider Notes (Signed)
[facility]    CSN: [id number] Arrival date & time: 09/28/23  1324      History   Chief Complaint No chief complaint on file.   HPI Sandra Glover is a 37 y.o. female.   Patient presenting today with 2-day history of low-grade fever, body aches, chills, headache, congestion, cough.  Denies chest pain, shortness of breath, abdominal pain, nausea vomiting or diarrhea.  So far trying DayQuil, NyQuil, over-the-counter pain relievers with minimal relief.  Daughter sick with similar symptoms.  History of seasonal allergies on antihistamines daily.    Past Medical History:  Diagnosis Date   Anxiety    Anxiety    Arthritis    Depression    History of anemia 2013   History of depression    states stopped med. in 2013   History of partial nephrectomy age 41 mos.   right - due to kidney infection   Hypertension    Osteopenia    Pertussis    Postcoital bleeding 07/14/2015   Sinus infection 01/25/2013   started antibiotic 01/24/2013 x 10 days; nasal congestion, clear drainage from nose   Tonsillar and adenoid hypertrophy 01/2013   Vaginal irritation 03/25/2014   Mild yeast but has history of yeast, will not treat til after first trimester,try yogurt    Patient Active Problem List   Diagnosis Date Noted   History of anemia 04/25/2023   Neck pain 04/25/2023   ADHD (attention deficit hyperactivity disorder) 04/25/2023   Tachycardia 11/10/2021   Bipolar 1 disorder (HCC) 11/10/2021   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   Acute sinusitis 06/28/2021   Major depressive disorder, recurrent episode, moderate (HCC) 06/22/2021   Pain in right arm 03/30/2021   Umbilical hernia 03/23/2021   Bipolar 1 disorder with moderate mania (HCC) 02/15/2021   Obesity 11/08/2020   Hyperlipidemia 11/08/2020   Osteoarthritis 08/05/2020   Hypertension 05/05/2020   Osteopenia 04/04/2020   Headache 12/19/2019   History of partial nephrectomy 12/01/2019   Exposure to 2019 novel coronavirus  06/06/2019   Adult ADHD 04/05/2019   Diarrhea 12/26/2018   Abdominal pain 12/26/2018   Irritable bowel syndrome 11/21/2018   Attention deficit hyperactivity disorder, 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   Anxiety 11/21/2018   Nexplanon insertion 09/25/2017   Hemorrhoids 09/19/2016   Rectal bleeding 03/24/2016   Constipation 03/24/2016   History of preterm delivery 09/09/2014   Pertussis 07/30/2014   History of gestational diabetes 07/24/2014   Cystic fibrosis carrier, 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   Depression with anxiety 03/03/2014    Past Surgical History:  Procedure Laterality Date   CHOLECYSTECTOMY  04/23/2010   laparoscopic   COLONOSCOPY WITH PROPOFOL N/A 05/17/2016   Procedure: COLONOSCOPY WITH PROPOFOL;  Surgeon: West Bali, MD;  [facility]: [facility];  Service: Endoscopy;  Laterality: N/A;  1000   HEMORRHOID BANDING N/A 05/17/2016   Procedure: HEMORRHOID BANDING;  Surgeon: West Bali, MD;  Location: [facility];  Service: Endoscopy;  Laterality: N/A;   HEMORRHOID SURGERY N/A 06/08/2016   Procedure: SIMPLE HEMORRHOIDECTOMY;  Surgeon: Ancil Linsey, MD;  Location: [facility];  Service: General;  Laterality: N/A;   PARTIAL NEPHRECTOMY Right age 74 mos.   TONSILLECTOMY AND ADENOIDECTOMY N/A 01/29/2013   Procedure: TONSILLECTOMY AND ADENOIDECTOMY;  Surgeon: Darletta Moll, MD;  Location: [facility];  Service: ENT;  Laterality: N/A;    OB History     Gravida  2   Para  2   Term  1   Preterm  1   AB      Living  2      SAB      IAB      Ectopic      Multiple      Live Births  2            Home Medications    Prior to Admission medications   Medication Sig Start Date End Date Taking? Authorizing Provider  promethazine-dextromethorphan (PROMETHAZINE-DM) 6.25-15 MG/5ML syrup Take 5 mLs by mouth 4 (four) times daily as needed. 09/28/23  Yes Particia Nearing, PA-C  ALPRAZolam Prudy Feeler) 1 MG tablet Take 1 tablet (1 mg total) by mouth  2 (two) times daily as needed for anxiety. 07/31/23 07/30/24  Myrlene Broker, MD  amphetamine-dextroamphetamine (ADDERALL) 20 MG tablet Take 1 tablet (20 mg total) by mouth 2 (two) times daily. 07/31/23 07/30/24  Myrlene Broker, MD  amphetamine-dextroamphetamine (ADDERALL) 20 MG tablet Take 1 tablet (20 mg total) by mouth 2 (two) times daily. 07/31/23 07/30/24  Myrlene Broker, MD  amphetamine-dextroamphetamine (ADDERALL) 20 MG tablet Take 1 tablet (20 mg total) by mouth 2 (two) times daily. 07/31/23 07/30/24  Myrlene Broker, MD  calcium-vitamin D (OSCAL WITH D) 500-200 MG-UNIT tablet Take 1 tablet by mouth.    [provider]  Drospirenone-Estetrol 3-14.2 MG TABS Take 1 tablet by mouth daily. 12/22/21   Lazaro Arms, MD  etonogestrel (NEXPLANON) 68 MG IMPL implant 1 each by Subdermal route once.    [provider]  FLUoxetine (PROZAC) 40 MG capsule TAKE 1 CAPSULE BY MOUTH EVERY DAY 07/31/23   Myrlene Broker, MD  gabapentin (NEURONTIN) 100 MG capsule Take 1 capsule (100 mg total) by mouth 3 (three) times daily. 07/25/23   Vickki Hearing, MD  ibuprofen (ADVIL) 600 MG tablet Take 1 tablet (600 mg total) by mouth every 6 (six) hours as needed. 12/14/22   Jacalyn Lefevre, MD  lisinopril-hydrochlorothiazide (ZESTORETIC) 20-12.5 MG tablet TAKE 1 TABLET BY MOUTH DAILY 08/04/23   Gilmore Laroche, FNP  meloxicam (MOBIC) 7.5 MG tablet Take 7.5 mg by mouth daily as needed for pain. 08/15/19   [provider]  oxyCODONE-acetaminophen (PERCOCET/ROXICET) 5-325 MG tablet Take 1 tablet by mouth every 6 (six) hours as needed for severe pain. 07/21/23   Roemhildt, Lorin T, PA-C  pantoprazole (PROTONIX) 40 MG tablet TAKE 1 TABLET(40 MG) BY MOUTH TWICE DAILY 07/19/22   Gelene Mink, NP  polyethylene glycol powder [facility]) 17 GM/SCOOP powder 1-4 scoop daily or as needed 12/17/21   Lazaro Arms, MD  QUEtiapine (SEROQUEL) 100 MG tablet Take 1 tablet (100 mg total) by mouth 2 (two) times  daily. 07/31/23   Myrlene Broker, MD    Family History Family History  Problem Relation Age of Onset   COPD Mother    Depression Mother    Anxiety disorder Mother    Hypertension Mother    Irritable bowel syndrome Mother    Arthritis Mother    Skin cancer Mother    Thyroid cancer Mother    Ovarian cancer Maternal Aunt    Ovarian cancer Maternal Grandmother    Cancer Maternal Grandmother        bladder, kidney   Hypertension Father    GER disease Father    Bipolar disorder Paternal Uncle    Anxiety disorder Cousin    Drug abuse Cousin    Bipolar disorder Cousin    Colon cancer Neg Hx  Social History Social History   Tobacco Use   Smoking status: Never    Passive exposure: Never   Smokeless tobacco: Never   Tobacco comments:    Never smoked  Vaping Use   Vaping status: Never Used  Substance Use Topics   Alcohol use: Yes    Alcohol/week: 0.0 standard drinks of alcohol    Comment: socially   Drug use: No     Allergies   Hydrocodone-acetaminophen, Lortab [hydrocodone-acetaminophen], and Morphine and codeine   Review of Systems Review of Systems Per HPI  Physical Exam Triage Vital Signs ED Triage Vitals  Encounter Vitals Group     BP 09/28/23 0950 122/88     Systolic BP Percentile --      Diastolic BP Percentile --      Pulse Rate 09/28/23 0950 80     Resp 09/28/23 0950 17     Temp 09/28/23 0950 98.3 F (36.8 C)     Temp Source 09/28/23 0950 Oral     SpO2 09/28/23 0950 96 %     Weight --      Height --      Head Circumference --      Peak Flow --      Pain Score 09/28/23 0953 5     Pain Loc --      Pain Education --      Exclude from Growth Chart --    No data found.  Updated Vital Signs BP 122/88 (BP Location: Right Arm)   Pulse 80   Temp 98.3 F (36.8 C) (Oral)   Resp 17   SpO2 96%   Visual Acuity Right Eye Distance:   Left Eye Distance:   Bilateral Distance:    Right Eye Near:   Left Eye Near:    Bilateral Near:      Physical Exam Vitals and nursing note reviewed.  Constitutional:      Appearance: Normal appearance.  HENT:     Head: Atraumatic.     Right Ear: Tympanic membrane and external ear normal.     Left Ear: Tympanic membrane and external ear normal.     Nose: Rhinorrhea present.     Mouth/Throat:     Mouth: Mucous membranes are moist.     Pharynx: Posterior oropharyngeal erythema present.  Eyes:     Extraocular Movements: Extraocular movements intact.     Conjunctiva/sclera: Conjunctivae normal.  Cardiovascular:     Rate and Rhythm: Normal rate and regular rhythm.     Heart sounds: Normal heart sounds.  Pulmonary:     Effort: Pulmonary effort is normal.     Breath sounds: Normal breath sounds. No wheezing or rales.  Musculoskeletal:        General: Normal range of motion.     Cervical back: Normal range of motion and neck supple.  Skin:    General: Skin is warm and dry.  Neurological:     Mental Status: She is alert and oriented to person, place, and time.  Psychiatric:        Mood and Affect: Mood normal.        Thought Content: Thought content normal.      UC Treatments / Results  Labs (all labs ordered are listed, but only abnormal results are displayed) Labs Reviewed - No data to display  EKG   Radiology No results found.  Procedures Procedures (including critical care time)  Medications Ordered in UC Medications - No data to display  Initial Impression /  Assessment and Plan / UC Course  I have reviewed the triage vital signs and the nursing notes.  Pertinent labs & imaging results that were available during my care of the patient were reviewed by me and considered in my medical decision making (see chart for details).     Vital signs and exam overall reassuring today and suggestive of a viral respiratory infection.  Respiratory testing declined with shared decision making today, treat symptomatically with Phenergan DM, supportive over-the-counter  medications and home care.  Work note given.  Return for worsening symptoms.  Final Clinical Impressions(s) / UC Diagnoses   Final diagnoses:  Viral URI with cough   Discharge Instructions   None    ED Prescriptions     Medication Sig Dispense Auth. Provider   promethazine-dextromethorphan (PROMETHAZINE-DM) 6.25-15 MG/5ML syrup Take 5 mLs by mouth 4 (four) times daily as needed. 100 mL Particia Nearing, [place]      PDMP not reviewed this encounter.   Particia Nearing, [place] 09/28/23 1211

## 2023-09-28 NOTE — ED Triage Notes (Signed)
Pt c/o headache nasal congestion, low grade fever, body aches x 2 days

## 2023-10-02 ENCOUNTER — Ambulatory Visit (HOSPITAL_COMMUNITY): Payer: Medicaid Other

## 2023-10-02 DIAGNOSIS — R262 Difficulty in walking, not elsewhere classified: Secondary | ICD-10-CM | POA: Diagnosis not present

## 2023-10-02 DIAGNOSIS — M6281 Muscle weakness (generalized): Secondary | ICD-10-CM

## 2023-10-02 DIAGNOSIS — M25571 Pain in right ankle and joints of right foot: Secondary | ICD-10-CM

## 2023-10-02 NOTE — Therapy (Signed)
[facility] PH[facility]Y TREAT[facility]   Patient Name: Sandra Glover MRN: [id number] DOB:1986/05/21, 37 y.o., female Today's Date: 10/02/2023  END OF SESSION:   PT End of Session - 10/02/23 0852     Visit Number 4    Number of Visits 10    Authorization Type [employer] ([place] Medicaid)    Authorization Time Period 10/14-12/13    Authorization - Number of Visits 12    PT Start Time 0850    PT Stop Time 0928    PT Time Calculation (min) 38 min    Equipment Utilized During Treatment Other (comment)   knee scooter   Activity Tolerance Patient tolerated treatment well    Behavior During Therapy [facility] for tasks assessed/performed              Past Medical History:  Diagnosis Date   Anxiety    Anxiety    Arthritis    Depression    History of anemia 2013   History of depression    states stopped med. 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   right - due to kidney infection   Hypertension    Osteopenia    Pertussis    Postcoital bleeding 07/14/2015   Sinus infection 01/25/2013   started antibiotic 01/24/2013 x 10 days; nasal congestion, clear drainage from nose   Tonsillar and adenoid hypertrophy 01/2013   Vaginal irritation 03/25/2014   Mild yeast but has history of yeast, will not treat til after first trimester,try yogurt   Past Surgical History:  Procedure Laterality Date   CHOLECYSTECTOMY  04/23/2010   laparoscopic   COLONOSCOPY WITH PROPOFOL N/A 05/17/2016   Procedure: COLONOSCOPY WITH PROPOFOL;  Surgeon: West Bali, MD;  [facility];  Service: Endoscopy;  Laterality: N/A;  1000   HEMORRHOID BANDING N/A 05/17/2016   Procedure: HEMORRHOID BANDING;  Surgeon: West Bali, MD;  [facility];  Service: Endoscopy;  Laterality: N/A;   HEMORRHOID SURGERY N/A 06/08/2016   Procedure: SIMPLE HEMORRHOIDECTOMY;  Surgeon: Ancil Linsey, MD;  Location: [facility];  Service: General;  Laterality: N/A;   PARTIAL NEPHRECTOMY Right age 32 mos.    TONSILLECTOMY AND ADENOIDECTOMY N/A 01/29/2013   Procedure: TONSILLECTOMY AND ADENOIDECTOMY;  Surgeon: Darletta Moll, MD;  [facility];  Service: ENT;  Laterality: N/A;   Patient Active Problem List   Diagnosis Date Noted   History of anemia 04/25/2023   Neck pain 04/25/2023   ADHD (attention deficit hyperactivity disorder) 04/25/2023   Tachycardia 11/10/2021   Bipolar 1 disorder (HCC) 11/10/2021   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   Acute sinusitis 06/28/2021   Major depressive disorder, recurrent episode, moderate (HCC) 06/22/2021   Pain in right arm 03/30/2021   Umbilical hernia 03/23/2021   Bipolar 1 disorder with moderate mania (HCC) 02/15/2021   Obesity 11/08/2020   Hyperlipidemia 11/08/2020   Osteoarthritis 08/05/2020   Hypertension 05/05/2020   Osteopenia 04/04/2020   Headache 12/19/2019   History of partial nephrectomy 12/01/2019   Exposure to 2019 novel coronavirus 06/06/2019   Adult ADHD 04/05/2019   Diarrhea 12/26/2018   Abdominal pain 12/26/2018   Irritable bowel syndrome 11/21/2018   Attention deficit hyperactivity disorder, 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   Anxiety 11/21/2018   Nexplanon insertion 09/25/2017   Hemorrhoids 09/19/2016   Rectal bleeding 03/24/2016   Constipation 03/24/2016   History of preterm delivery 09/09/2014   Pertussis 07/30/2014   History of gestational diabetes 07/24/2014  Cystic fibrosis carrier, 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   Depression with anxiety 03/03/2014    PCP: Gilmore Laroche, FNPPCP - General   REFERRING PROVIDER: Vickki Hearing, MD   REFERRING DIAG: [phone] (ICD-10-CM) - Closed displaced avulsion fracture of right talus with routine healing, subsequent encounter  Rationale for Evaluation and Treatment: Rehabilitation  THERAPY DIAG:  Difficulty in walking, not elsewhere classified  Pain in right ankle and joints of right foot  Muscle weakness (generalized)  ONSET DATE: Jul 21, 2023   --------------------------------------------------------------------------------------------- SUBJECTIVE:                                                                                                                                                                                           SUBJECTIVE STATEMENT: Patient with minimal pain today; 3/10. Patient uses CAM Boot @ work only; no more scooter use   IE: On 07/21/23, patient was playing with her dog and stepped in a hole and heard her right foot "crack/pop". Patient went to ED same day; XR to show + right Avulsion fracture. Pt went to Dr. Alto Denver 07/25/23. Patient was put into Aircast and NWB for 4 weeks. Patient went ot MD on 08/14/23 for follow-up Xray avulsion fracture fragment still visible. Patient returned to work 9/12 with + Scooter, light duty. On 09/04/23, MD referred pt to OPPT, partial weightbearing in cast. PT cleared to begin WB when starting OPT.   PERTINENT HISTORY:  Right Arm surgery Pelvic Pain- Rectocele, Cystocele (had surgery scheduled) Right hip   PAIN:  Are you having pain? Yes: NPRS scale: 2/10 Pain location: right ankle/ right hip Pain description: dull, soreness Aggravating factors: squats, walking Relieving factors: rest  PRECAUTIONS: None  RED FLAGS: None   WEIGHT BEARING RESTRICTIONS: No; cleared to begin WB during PT   FALLS:  Has patient fallen in last 6 months? No  LIVING ENVIRONMENT: Lives with: lives with their family and lives with their spouse Lives in: House/apartment Stairs:  3 level home; 2 sets of stairs  Has following equipment at home:  scooter,   OCCUPATION: Office manager- sedentary   PLOF: Independent  PATIENT GOALS: To walk pain free  NEXT MD VISIT: 10/16/23 --------------------------------------------------------------------------------------------- OBJECTIVE:   DIAGNOSTIC FINDINGS:  Right foot 3 views   Talar avulsion fracture   X-rays taken to see if there are  any new fractures   No new fractures are noted avulsion fracture fragment still visible   Impression avulsion fracture dorsal talus no additional fractures     SCREENING FOR RED FLAGS: Bowel or bladder incontinence: No Spinal tumors: No Cauda equina syndrome: No Compression fracture: No Abdominal aneurysm: No  COGNITION:  Overall cognitive status: Within functional limits for tasks assessed  POSTURE: No Significant postural limitations         GAIT ANALYSIS: Distance walked: 25' Assistive device utilized: None Level of assistance: Complete Independence Comments: Patient with minimal antalgia on right foot due to + AirCast  SENSATION: WFL    LOWER EXTREMITY MMT:    MMT Right eval Left eval  Hip flexion 3+ 4-  Hip extension 3+ 3+  Hip abduction 4- 4-  Hip adduction    Hip internal rotation    Hip external rotation    Knee flexion    Knee extension    Ankle dorsiflexion 3-   Ankle plantarflexion 3-   Ankle inversion 3-   Ankle eversion 3-    (Blank rows = not tested)  LOWER EXTREMITY ROM:     Active  Right eval Left eval  Hip flexion    Hip extension    Hip abduction    Hip adduction    Hip internal rotation    Hip external rotation    Knee flexion    Knee extension    Ankle dorsiflexion 0 pain   Ankle plantarflexion 0-35   Ankle inversion WFL   Ankle eversion 0-5 pain     (Blank rows = not tested)   PALPATION: MOD tenderness to palpation right Ankle( lateral/medial aspect)  INTEGUMENTARY   Minimal swelling/edema right lateral ankle  --------------------------------------------------------------------------------------------- TODAY'S TREATMENT:                                                                                                                              DATE:   10/02/23 Bike Warm Up L2 Seat #7 Calf board stretch Squat + heels elevated in // bars 2x10 Closed kinematic chain mobilization with movement into ankle  dorsiflexion  Step ups/downs on 6" step in // bars, with and without Airex     09/25/23  Runner's stretch on wall Weight shifts onto right lower extremity on 2" step in //bars Step up/ step downs 2" step in // bars Gait Training around clinic with single point cane Cool down on bike L2     09/21/23 -Supine joint mobilizations Gr 3-4 to right TCJ  -Supine right ankle PROM/ Isometrics into PT resistance -Standing onto 2" step  Forward leans to increase weightbearing   Step ups/downs    09/18/23 *DO next session Pt Initial Eval  Therapeutic exercise  Long Sitting Ankle Eversion with Resistance  20 reps Long Sitting Ankle Inversion with Resistance  20 reps   PATIENT EDUCATION:  Education details: HEP Person educated: Patient Education method: Medical illustrator Education comprehension: verbalized understanding and returned demonstration  HOME EXERCISE PROGRAM: Access Code: [email or web address] URL: [email or web address] Date: 09/25/2023 Prepared by: Seymour Bars  Exercises - Long Sitting Ankle Eversion with Resistance  - 1-2 x daily - 20 reps - Long Sitting Ankle Inversion with Resistance  - 1-2 x daily - 20 reps - Gastroc Stretch on  Wall  - 1-2 x daily - 10 reps - 10 secs hold - Standing Heel Raise with Support  - 1-2 x daily - 20 reps - 3 secs hold --------------------------------------------------------------------------------------------- ASSESSMENT:  CLINICAL IMPRESSION: Today: Patient arrives to clinic with no CAM Boot, no scooter. PT focused session on right ankle joint mobility, closed kinematic chain dorsiflexion to improve AROM with mobilization with movement techniques. PT then focused on Therapeutic Activity focusing on step ups to facilitate stair negotiation needed at work/home. Pt tolerated well with no pain. Patient will continue to benefit from PT to return to prior level of function   IE: Patient is a 37 y.o. y.o. female who was  seen today for physical therapy evaluation and treatment for diffciulty walking, right ankle pain, weakness s/p right talus avulsion fracture in 07/2023- non surgical. Patient presents to PT with the following objective impairments: Abnormal gait, decreased balance, decreased endurance, difficulty walking, decreased strength, hypomobility, impaired flexibility, and pain. These impairments limit the patient in activities such as carrying, lifting, standing, squatting, stairs, transfers, and locomotion level. These impairments also limit the patient in participation such as meal prep, cleaning, laundry, personal finances, interpersonal relationship, driving, shopping, community activity, occupation, and yard work. The patient will benefit from PT to address the limitations/impairments listed below to return to their prior level of function in the domains of activity and participation.    PERSONAL FACTORS:  n/a  are also affecting patient's functional outcome.   REHAB POTENTIAL: Excellent  CLINICAL DECISION MAKING: Stable/uncomplicated  EVALUATION COMPLEXITY: Low  --------------------------------------------------------------------------------------------- GOALS: Goals reviewed with patient? No  SHORT TERM GOALS: Target date: 10/16/2023    1.  Patient will improve right Ankle Eversion/dorsiflexion by 5 degrees to demonstrate an improvement in ADL completion, stair negotiation, household/community ambulation, and self-care Baseline:  Goal status: INITIAL  3. Patient will be independent with a basic stretching/strengthening HEP  Baseline:  Goal status: INITIAL   LONG TERM GOALS: Target date: 11/13/2023     Patient will improve right Ankle Eversion/dorsiflexion to be [facility] to demonstrate an improvement in ADL completion, stair negotiation, household/community ambulation, and self-care Baseline:  Goal status: INITIAL  2.  Patient will be able to walk with no AD with 0/10 pain to facilitate  community ambulation  Baseline:  Goal status: INITIAL  3.  Patient will be independent with a comprehensive strengthening HEP  Baseline:  Goal status: INITIAL  --------------------------------------------------------------------------------------------- PLAN:  PT FREQUENCY: 1-2x/week  PT DURATION: 8 weeks  PLANNED INTERVENTIONS: 97110-Therapeutic exercises, 97530- Therapeutic activity, 97112- Neuromuscular re-education, 97535- Self Care, 24401- Manual therapy, [phone]- Gait training, Balance training, Stair training, Taping, Dry Needling, Joint mobilization, Joint manipulation, Spinal manipulation, Spinal mobilization, Cryotherapy, and Moist heat.  PLAN FOR NEXT SESSION: Introduce lower extremity strength, more weightbearing    Seymour Bars, PT 10/02/2023, 8:53 AM

## 2023-10-09 ENCOUNTER — Ambulatory Visit (HOSPITAL_COMMUNITY): Payer: Medicaid Other | Attending: Orthopedic Surgery

## 2023-10-09 DIAGNOSIS — M6281 Muscle weakness (generalized): Secondary | ICD-10-CM | POA: Diagnosis present

## 2023-10-09 DIAGNOSIS — R262 Difficulty in walking, not elsewhere classified: Secondary | ICD-10-CM | POA: Insufficient documentation

## 2023-10-09 DIAGNOSIS — M25571 Pain in right ankle and joints of right foot: Secondary | ICD-10-CM | POA: Diagnosis present

## 2023-10-09 NOTE — Therapy (Signed)
[facility] OUTPATIENT PHYSICAL THERAPY TREATMENT   Patient Name: Sandra Glover MRN: [id number] DOB:September 07, 1986, 37 y.o., female Today's Date: 10/09/2023  END OF SESSION:   PT End of Session - 10/09/23 0859     Visit Number 5    Number of Visits 10    Authorization Type [employer] ([place] [employer])    Authorization Time Period 10/14-12/13    Authorization - Number of Visits 12    PT Start Time 0845    PT Stop Time 0925    PT Time Calculation (min) 40 min    Equipment Utilized During Treatment --    Activity Tolerance Patient tolerated treatment well    Behavior During Therapy [facility] for tasks assessed/performed              Past Medical History:  Diagnosis Date   Anxiety    Anxiety    Arthritis    Depression    History of anemia 2013   History of depression    states stopped med. in 2013   History of partial nephrectomy age 37 mos.   right - due to kidney infection   Hypertension    Osteopenia    Pertussis    Postcoital bleeding 07/14/2015   Sinus infection 01/25/2013   started antibiotic 01/24/2013 x 10 days; nasal congestion, clear drainage from nose   Tonsillar and adenoid hypertrophy 01/2013   Vaginal irritation 03/25/2014   Mild yeast but has history of yeast, will not treat til after first trimester,try yogurt   Past Surgical History:  Procedure Laterality Date   CHOLECYSTECTOMY  04/23/2010   laparoscopic   COLONOSCOPY WITH PROPOFOL N/A 05/17/2016   Procedure: COLONOSCOPY WITH PROPOFOL;  Surgeon: West Bali, MD;  Location: [facility];  Service: Endoscopy;  Laterality: N/A;  1000   HEMORRHOID BANDING N/A 05/17/2016   Procedure: HEMORRHOID BANDING;  Surgeon: West Bali, MD;  Location: [facility];  Service: Endoscopy;  Laterality: N/A;   HEMORRHOID SURGERY N/A 06/08/2016   Procedure: SIMPLE HEMORRHOIDECTOMY;  Surgeon: Ancil Linsey, MD;  Location: [facility];  Service: General;  Laterality: N/A;   PARTIAL NEPHRECTOMY Right age 8 mos.   TONSILLECTOMY AND  ADENOIDECTOMY N/A 01/29/2013   Procedure: TONSILLECTOMY AND ADENOIDECTOMY;  Surgeon: Darletta Moll, MD;  Location: [facility];  Service: ENT;  Laterality: N/A;   Patient Active Problem List   Diagnosis Date Noted   History of anemia 04/25/2023   Neck pain 04/25/2023   ADHD (attention deficit hyperactivity disorder) 04/25/2023   Tachycardia 11/10/2021   Bipolar 1 disorder (HCC) 11/10/2021   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   Acute sinusitis 06/28/2021   Major depressive disorder, recurrent episode, moderate (HCC) 06/22/2021   Pain in right arm 03/30/2021   Umbilical hernia 03/23/2021   Bipolar 1 disorder with moderate mania (HCC) 02/15/2021   Obesity 11/08/2020   Hyperlipidemia 11/08/2020   Osteoarthritis 08/05/2020   Hypertension 05/05/2020   Osteopenia 04/04/2020   Headache 12/19/2019   History of partial nephrectomy 12/01/2019   Exposure to 2019 novel coronavirus 06/06/2019   Adult ADHD 04/05/2019   Diarrhea 12/26/2018   Abdominal pain 12/26/2018   Irritable bowel syndrome 11/21/2018   Attention deficit hyperactivity disorder, 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   Anxiety 11/21/2018   Nexplanon insertion 09/25/2017   Hemorrhoids 09/19/2016   Rectal bleeding 03/24/2016   Constipation 03/24/2016   History of preterm delivery 09/09/2014   Pertussis 07/30/2014   History of gestational diabetes 07/24/2014   Cystic fibrosis carrier, antepartum  03/08/2014   Depression with anxiety 03/03/2014    PCP: Gilmore Laroche, FNPPCP - General   REFERRING PROVIDER: Vickki Hearing, MD   REFERRING DIAG: [phone] (ICD-10-CM) - Closed displaced avulsion fracture of right talus with routine healing, subsequent encounter  Rationale for Evaluation and Treatment: Rehabilitation  THERAPY DIAG:  No diagnosis found.  ONSET DATE: Jul 21, 2023   --------------------------------------------------------------------------------------------- SUBJECTIVE:                                                                                                                                                                                            SUBJECTIVE STATEMENT: Patient with minimal pain today; 2/10. Patient discontinued CAM Boot @ home and work within the last week    IE: On 07/21/23, patient was playing with her dog and stepped in a hole and heard her right foot "crack/pop". Patient went to ED same day; XR to show + right Avulsion fracture. Pt went to Dr. Alto Denver 07/25/23. Patient was put into Aircast and NWB for 4 weeks. Patient went ot MD on 08/14/23 for follow-up Xray avulsion fracture fragment still visible. Patient returned to work 9/12 with + Scooter, light duty. On 09/04/23, MD referred pt to OPPT, partial weightbearing in cast. PT cleared to begin WB when starting OPT.   PERTINENT HISTORY:  Right Arm surgery Pelvic Pain- Rectocele, Cystocele (had surgery scheduled) Right hip   PAIN:  Are you having pain? Yes: NPRS scale: 2/10 Pain location: right ankle/ right hip Pain description: dull, soreness Aggravating factors: squats, walking Relieving factors: rest  PRECAUTIONS: None  RED FLAGS: None   WEIGHT BEARING RESTRICTIONS: No; cleared to begin WB during PT   FALLS:  Has patient fallen in last 6 months? No  LIVING ENVIRONMENT: Lives with: lives with their family and lives with their spouse Lives in: House/apartment Stairs:  3 level home; 2 sets of stairs  Has following equipment at home:  scooter,   OCCUPATION: Office manager- sedentary   PLOF: Independent  PATIENT GOALS: To walk pain free  NEXT MD VISIT: 10/16/23 --------------------------------------------------------------------------------------------- OBJECTIVE:   DIAGNOSTIC FINDINGS:  Right foot 3 views   Talar avulsion fracture   X-rays taken to see if there are any new fractures   No new fractures are noted avulsion fracture fragment still visible   Impression avulsion fracture dorsal  talus no additional fractures     SCREENING FOR RED FLAGS: Bowel or bladder incontinence: No Spinal tumors: No Cauda equina syndrome: No Compression fracture: No Abdominal aneurysm: No  COGNITION: Overall cognitive status: Within functional limits for tasks assessed  POSTURE: No Significant postural limitations  GAIT ANALYSIS: Distance walked: 25' Assistive device utilized: None Level of assistance: Complete Independence Comments: Patient with minimal antalgia on right foot due to + AirCast  SENSATION: WFL    LOWER EXTREMITY MMT:    MMT Right eval Left eval  Hip flexion 3+ 4-  Hip extension 3+ 3+  Hip abduction 4- 4-  Hip adduction    Hip internal rotation    Hip external rotation    Knee flexion    Knee extension    Ankle dorsiflexion 3-   Ankle plantarflexion 3-   Ankle inversion 3-   Ankle eversion 3-    (Blank rows = not tested)  LOWER EXTREMITY ROM:     Active  Right eval Left eval  Hip flexion    Hip extension    Hip abduction    Hip adduction    Hip internal rotation    Hip external rotation    Knee flexion    Knee extension    Ankle dorsiflexion 0 pain   Ankle plantarflexion 0-35   Ankle inversion WFL   Ankle eversion 0-5 pain     (Blank rows = not tested)   PALPATION: MOD tenderness to palpation right Ankle( lateral/medial aspect)  INTEGUMENTARY   Minimal swelling/edema right lateral ankle  --------------------------------------------------------------------------------------------- TODAY'S TREATMENT:                                                                                                                              DATE:   10/09/23 Ankle Brace use for activity modification  Bike Warm Up L2 Leg press machine   Double leg 40#  Single leg 20#  Calf press 40# Forward step onto bosu  Single leg stance + foam roller 3-5" Holds     10/02/23 Bike Warm Up L2 Seat #7 Calf board stretch Squat + heels elevated  in // bars 2x10 Closed kinematic chain mobilization with movement into ankle dorsiflexion  Step ups/downs on 6" step in // bars, with and without Airex     09/25/23  Runner's stretch on wall Weight shifts onto right lower extremity on 2" step in //bars Step up/ step downs 2" step in // bars Gait Training around clinic with single point cane Cool down on bike L2     09/21/23 -Supine joint mobilizations Gr 3-4 to right TCJ  -Supine right ankle PROM/ Isometrics into PT resistance -Standing onto 2" step  Forward leans to increase weightbearing   Step ups/downs    09/18/23 *DO next session Pt Initial Eval  Therapeutic exercise  Long Sitting Ankle Eversion with Resistance  20 reps Long Sitting Ankle Inversion with Resistance  20 reps   PATIENT EDUCATION:  Education details: HEP Person educated: Patient Education method: Medical illustrator Education comprehension: verbalized understanding and returned demonstration  HOME EXERCISE PROGRAM: Access Code: [email or web address] URL: [email or web address] Date: 10/09/2023 Prepared by: Seymour Bars  Exercises - Gastroc Stretch on Wall  - 1 x daily - 10  reps - 10 secs hold - Standing Heel Raise with Support  - 1 x daily - 20 reps - 3 secs hold - Standing Dorsiflexion Self-Mobilization on Step  - 1 x daily - 10 reps - 3 secs hold - Step Up  - 1 x daily - 20 reps - Squat  - 1 x daily - 20 reps - Single Leg Stance with Support  - 20 reps - 3-5 seconds hold --------------------------------------------------------------------------------------------- ASSESSMENT:  CLINICAL IMPRESSION: Today: Patient arrives to clinic with no CAM Boot, no scooter. PT focused session on right ankle joint mobility, stability. Pt introduced single leg balance for Ankle proprioception/ balance. Pt tolerated well with no pain with increased unsteadiness in the ankle, especially on BOSU surface.  Patient will continue to benefit from PT  to return to prior level of function   IE: Patient is a 37 y.o. y.o. female who was seen today for physical therapy evaluation and treatment for diffciulty walking, right ankle pain, weakness s/p right talus avulsion fracture in 07/2023- non surgical. Patient presents to PT with the following objective impairments: Abnormal gait, decreased balance, decreased endurance, difficulty walking, decreased strength, hypomobility, impaired flexibility, and pain. These impairments limit the patient in activities such as carrying, lifting, standing, squatting, stairs, transfers, and locomotion level. These impairments also limit the patient in participation such as meal prep, cleaning, laundry, personal finances, interpersonal relationship, driving, shopping, community activity, occupation, and yard work. The patient will benefit from PT to address the limitations/impairments listed below to return to their prior level of function in the domains of activity and participation.    PERSONAL FACTORS:  n/a  are also affecting patient's functional outcome.   REHAB POTENTIAL: Excellent  CLINICAL DECISION MAKING: Stable/uncomplicated  EVALUATION COMPLEXITY: Low  --------------------------------------------------------------------------------------------- GOALS: Goals reviewed with patient? No  SHORT TERM GOALS: Target date: 10/16/2023    1.  Patient will improve right Ankle Eversion/dorsiflexion by 5 degrees to demonstrate an improvement in ADL completion, stair negotiation, household/community ambulation, and self-care Baseline:  Goal status: INITIAL  3. Patient will be independent with a basic stretching/strengthening HEP  Baseline:  Goal status: INITIAL   LONG TERM GOALS: Target date: 11/13/2023     Patient will improve right Ankle Eversion/dorsiflexion to be [facility] to demonstrate an improvement in ADL completion, stair negotiation, household/community ambulation, and self-care Baseline:  Goal  status: INITIAL  2.  Patient will be able to walk with no AD with 0/10 pain to facilitate community ambulation  Baseline:  Goal status: INITIAL  3.  Patient will be independent with a comprehensive strengthening HEP  Baseline:  Goal status: INITIAL  --------------------------------------------------------------------------------------------- PLAN:  PT FREQUENCY: 1-2x/week  PT DURATION: 8 weeks  PLANNED INTERVENTIONS: 97110-Therapeutic exercises, 97530- Therapeutic activity, 97112- Neuromuscular re-education, 97535- Self Care, [id number]- Manual therapy, [phone]- Gait training, Balance training, Stair training, Taping, Dry Needling, Joint mobilization, Joint manipulation, Spinal manipulation, Spinal mobilization, Cryotherapy, and Moist heat.  PLAN FOR NEXT SESSION: Introduce lower extremity strength, more weightbearing    Seymour Bars, PT 10/09/2023, 4:26 PM

## 2023-10-16 ENCOUNTER — Encounter: Payer: Medicaid Other | Admitting: Orthopedic Surgery

## 2023-10-18 ENCOUNTER — Ambulatory Visit: Payer: Medicaid Other | Admitting: Orthopedic Surgery

## 2023-10-18 ENCOUNTER — Encounter: Payer: Self-pay | Admitting: Orthopedic Surgery

## 2023-10-18 DIAGNOSIS — S92151D Displaced avulsion fracture (chip fracture) of right talus, subsequent encounter for fracture with routine healing: Secondary | ICD-10-CM

## 2023-10-18 NOTE — Progress Notes (Signed)
   VISIT TYPE: FOLLOW UP   Chief Complaint  Patient presents with   Ankle Injury    Right 07/21/23    Encounter Diagnosis  Name Primary?   Closed displaced avulsion fracture of right talus with routine healing, subsequent encounter 07/21/23 Yes    Assessment and Plan:  37 year old female improving with her right ankle/foot injury  She still has somewhat of a limp but I think at this point we have guided her through the main recovery phase.  She should do well with home exercises and ankle sleeve for about 4 more weeks  Return if any problems    There were no vitals taken for this visit.  Ortho Exam Right ankle no swelling plantarflexion dorsiflexion plantarflexion/inversion all were very good plantarflexion eversion showed some weakness stable inversion eversion testing and anterior drawer testing  Imaging no imaging today  A/P Encounter Diagnosis  Name Primary?   Closed displaced avulsion fracture of right talus with routine healing, subsequent encounter 07/21/23 Yes    No orders of the defined types were placed in this encounter.

## 2023-10-18 NOTE — Patient Instructions (Signed)
Continue with home therapy program and ankle sleeve for a 4 weeks   Use ice for 30 min for any swelling and   Take tylenol XS for pain if needed

## 2023-10-23 ENCOUNTER — Ambulatory Visit
Admission: EM | Admit: 2023-10-23 | Discharge: 2023-10-23 | Disposition: A | Discharge status: Home / Self Care | Payer: Medicaid Other | Attending: Nurse Practitioner | Admitting: Nurse Practitioner

## 2023-10-23 DIAGNOSIS — R059 Cough, unspecified: Secondary | ICD-10-CM | POA: Diagnosis not present

## 2023-10-23 DIAGNOSIS — J0140 Acute pansinusitis, unspecified: Secondary | ICD-10-CM | POA: Diagnosis not present

## 2023-10-23 MED ORDER — PREDNISONE 20 MG PO TABS: 40.0000 mg | ORAL_TABLET | Freq: Every day | ORAL | 0 refills | Status: AC

## 2023-10-23 MED ORDER — FLUCONAZOLE 150 MG PO TABS: ORAL_TABLET | ORAL | 0 refills | Status: DC

## 2023-10-23 MED ORDER — AMOXICILLIN-POT CLAVULANATE 875-125 MG PO TABS: 1.0000 | ORAL_TABLET | Freq: Two times a day (BID) | ORAL | 0 refills | Status: DC

## 2023-10-23 MED ORDER — PSEUDOEPH-BROMPHEN-DM 30-2-10 MG/5ML PO SYRP: 5.0000 mL | ORAL_SOLUTION | Freq: Four times a day (QID) | ORAL | 0 refills | Status: DC | PRN

## 2023-10-23 NOTE — Discharge Instructions (Addendum)
Take medication as directed. Continue your current allergy medication regimen.  Also continue Flonase daily as directed. Increase fluids and get plenty of rest. May take over-the-counter ibuprofen or Tylenol as needed for pain, fever, or general discomfort. Recommend normal saline nasal spray to help with nasal congestion throughout the day. For your cough, it may be helpful to use a humidifier at bedtime during sleep. As discussed, if symptoms do not improve with this treatment, you may follow-up in this clinic or with your primary care physician for further evaluation. Follow-up as needed.

## 2023-10-23 NOTE — ED Triage Notes (Signed)
Pt was seen here 1.5 wks ago, here for continued cough, tightness in breathing. Is worsening since UC visit. Rib pain with inhalation x 3 days. Took all of the cough syrup, now taking OTC cough medicine.

## 2023-10-23 NOTE — ED Provider Notes (Signed)
[facility]    CSN: [id number] Arrival date & time: 10/23/23  0841      History   Chief Complaint Chief Complaint  Patient presents with   Cough   Shortness of Breath    HPI Sandra Glover is a 37 y.o. female.   The history is provided by the patient.   Patient presents for complaints of worsening cough, headache, nasal congestion, and sinus pressure.  Patient was seen in this clinic on 09/24/2023 and diagnosed with a viral URI.  She was prescribed Promethazine DM at that time.  Patient denies fever, chills, wheezing, difficulty breathing, abdominal pain, nausea, vomiting, or diarrhea.  She states that her cough has become more persistent, causing chest pain with coughing and deep breathing.  She also complains of chest tightness and pain in her rib cage with deep breathing.  Patient reports that she completed the promethazine, she has been taking over-the-counter medications for her symptoms with minimal relief.  Past Medical History:  Diagnosis Date   Anxiety    Anxiety    Arthritis    Depression    History of anemia 2013   History of depression    states stopped med. in 2013   History of partial nephrectomy age 57 mos.   right - due to kidney infection   Hypertension    Osteopenia    Pertussis    Postcoital bleeding 07/14/2015   Sinus infection 01/25/2013   started antibiotic 01/24/2013 x 10 days; nasal congestion, clear drainage from nose   Tonsillar and adenoid hypertrophy 01/2013   Vaginal irritation 03/25/2014   Mild yeast but has history of yeast, will not treat til after first trimester,try yogurt    Patient Active Problem List   Diagnosis Date Noted   History of anemia 04/25/2023   Neck pain 04/25/2023   ADHD (attention deficit hyperactivity disorder) 04/25/2023   Tachycardia 11/10/2021   Bipolar 1 disorder (HCC) 11/10/2021   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   Acute sinusitis 06/28/2021   Major depressive disorder, recurrent episode,  moderate (HCC) 06/22/2021   Pain in right arm 03/30/2021   Umbilical hernia 03/23/2021   Bipolar 1 disorder with moderate mania (HCC) 02/15/2021   Obesity 11/08/2020   Hyperlipidemia 11/08/2020   Osteoarthritis 08/05/2020   Hypertension 05/05/2020   Osteopenia 04/04/2020   Headache 12/19/2019   History of partial nephrectomy 12/01/2019   Exposure to 2019 novel coronavirus 06/06/2019   Adult ADHD 04/05/2019   Diarrhea 12/26/2018   Abdominal pain 12/26/2018   Irritable bowel syndrome 11/21/2018   Attention deficit hyperactivity disorder, 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   Anxiety 11/21/2018   Nexplanon insertion 09/25/2017   Hemorrhoids 09/19/2016   Rectal bleeding 03/24/2016   Constipation 03/24/2016   History of preterm delivery 09/09/2014   Pertussis 07/30/2014   History of gestational diabetes 07/24/2014   Cystic fibrosis carrier, 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   Depression with anxiety 03/03/2014    Past Surgical History:  Procedure Laterality Date   CHOLECYSTECTOMY  04/23/2010   laparoscopic   COLONOSCOPY WITH PROPOFOL N/A 05/17/2016   Procedure: COLONOSCOPY WITH PROPOFOL;  Surgeon: West Bali, MD;  Location: [facility];  Service: Endoscopy;  Laterality: N/A;  1000   HEMORRHOID BANDING N/A 05/17/2016   Procedure: HEMORRHOID BANDING;  Surgeon: West Bali, MD;  Location: [facility];  Service: Endoscopy;  Laterality: N/A;   HEMORRHOID SURGERY N/A 06/08/2016   Procedure: SIMPLE HEMORRHOIDECTOMY;  Surgeon: Ancil Linsey, MD;  Location: [facility];  Service: General;  Laterality: N/A;   PARTIAL NEPHRECTOMY Right age 64 mos.   TONSILLECTOMY AND ADENOIDECTOMY N/A 01/29/2013   Procedure: TONSILLECTOMY AND ADENOIDECTOMY;  Surgeon: Darletta Moll, MD;  Location: [facility];  Service: ENT;  Laterality: N/A;    OB History     Gravida  2   Para  2   Term  1   Preterm  1   AB      Living  2      SAB      IAB      Ectopic      Multiple       Live Births  2            Home Medications    Prior to Admission medications   Medication Sig Start Date End Date Taking? Authorizing Provider  ALPRAZolam Prudy Feeler) 1 MG tablet Take 1 tablet (1 mg total) by mouth 2 (two) times daily as needed for anxiety. 07/31/23 07/30/24 Yes Myrlene Broker, MD  amoxicillin-clavulanate (AUGMENTIN) 875-125 MG tablet Take 1 tablet by mouth every 12 (twelve) hours. 10/23/23  Yes Leath-Warren, Sadie Haber, NP  amphetamine-dextroamphetamine (ADDERALL) 20 MG tablet Take 1 tablet (20 mg total) by mouth 2 (two) times daily. 07/31/23 07/30/24 Yes Myrlene Broker, MD  amphetamine-dextroamphetamine (ADDERALL) 20 MG tablet Take 1 tablet (20 mg total) by mouth 2 (two) times daily. 07/31/23 07/30/24 Yes Myrlene Broker, MD  amphetamine-dextroamphetamine (ADDERALL) 20 MG tablet Take 1 tablet (20 mg total) by mouth 2 (two) times daily. 07/31/23 07/30/24 Yes Myrlene Broker, MD  brompheniramine-pseudoephedrine-DM 30-2-10 MG/5ML syrup Take 5 mLs by mouth 4 (four) times daily as needed. 10/23/23  Yes Leath-Warren, Sadie Haber, NP  calcium-vitamin D (OSCAL WITH D) 500-200 MG-UNIT tablet Take 1 tablet by mouth.   Yes [provider]  Drospirenone-Estetrol 3-14.2 MG TABS Take 1 tablet by mouth daily. 12/22/21  Yes Lazaro Arms, MD  etonogestrel (NEXPLANON) 68 MG IMPL implant 1 each by Subdermal route once.   Yes [provider]  fluconazole (DIFLUCAN) 150 MG tablet Take 1 tablet by mouth today.  May repeat in 72 hours if symptoms continue to persist. 10/23/23  Yes Leath-Warren, Sadie Haber, NP  FLUoxetine (PROZAC) 40 MG capsule TAKE 1 CAPSULE BY MOUTH EVERY DAY 07/31/23  Yes Myrlene Broker, MD  gabapentin (NEURONTIN) 100 MG capsule Take 1 capsule (100 mg total) by mouth 3 (three) times daily. 07/25/23  Yes Vickki Hearing, MD  ibuprofen (ADVIL) 600 MG tablet Take 1 tablet (600 mg total) by mouth every 6 (six) hours as needed. 12/14/22  Yes Jacalyn Lefevre, MD   lisinopril-hydrochlorothiazide (ZESTORETIC) 20-12.5 MG tablet TAKE 1 TABLET BY MOUTH DAILY 08/04/23  Yes Gilmore Laroche, FNP  pantoprazole (PROTONIX) 40 MG tablet TAKE 1 TABLET(40 MG) BY MOUTH TWICE DAILY 07/19/22  Yes Gelene Mink, NP  polyethylene glycol powder (GLYCOLAX/MIRALAX) 17 GM/SCOOP powder 1-4 scoop daily or as needed 12/17/21  Yes Eure, Amaryllis Dyke, MD  predniSONE (DELTASONE) 20 MG tablet Take 2 tablets (40 mg total) by mouth daily with breakfast for 5 days. 10/23/23 10/28/23 Yes Leath-Warren, Sadie Haber, NP  promethazine-dextromethorphan (PROMETHAZINE-DM) 6.25-15 MG/5ML syrup Take 5 mLs by mouth 4 (four) times daily as needed. 09/28/23  Yes Particia Nearing, PA-C  QUEtiapine (SEROQUEL) 100 MG tablet Take 1 tablet (100 mg total) by mouth 2 (two) times daily. 07/31/23  Yes Myrlene Broker, MD  meloxicam (MOBIC) 7.5 MG tablet Take 7.5 mg by mouth daily  as needed for pain. 08/15/19   [provider]  oxyCODONE-acetaminophen (PERCOCET/ROXICET) 5-325 MG tablet Take 1 tablet by mouth every 6 (six) hours as needed for severe pain. 07/21/23   Roemhildt, Lora Paula, PA-C    Family History Family History  Problem Relation Age of Onset   COPD Mother    Depression Mother    Anxiety disorder Mother    Hypertension Mother    Irritable bowel syndrome Mother    Arthritis Mother    Skin cancer Mother    Thyroid cancer Mother    Ovarian cancer Maternal Aunt    Ovarian cancer Maternal Grandmother    Cancer Maternal Grandmother        bladder, kidney   Hypertension Father    GER disease Father    Bipolar disorder Paternal Uncle    Anxiety disorder Cousin    Drug abuse Cousin    Bipolar disorder Cousin    Colon cancer Neg Hx     Social History Social History   Tobacco Use   Smoking status: Never    Passive exposure: Never   Smokeless tobacco: Never   Tobacco comments:    Never smoked  Vaping Use   Vaping status: Never Used  Substance Use Topics   Alcohol use: Yes     Alcohol/week: 0.0 standard drinks of alcohol    Comment: socially   Drug use: No     Allergies   Hydrocodone-acetaminophen, Lortab [hydrocodone-acetaminophen], and Morphine and codeine   Review of Systems Review of Systems Per HPI  Physical Exam Triage Vital Signs ED Triage Vitals  Encounter Vitals Group     BP 10/23/23 0902 114/78     Systolic BP Percentile --      Diastolic BP Percentile --      Pulse Rate 10/23/23 0902 82     Resp 10/23/23 0902 18     Temp 10/23/23 0902 98.1 F (36.7 C)     Temp Source 10/23/23 0902 Oral     SpO2 10/23/23 0902 95 %     Weight --      Height --      Head Circumference --      Peak Flow --      Pain Score 10/23/23 0856 4     Pain Loc --      Pain Education --      Exclude from Growth Chart --    No data found.  Updated Vital Signs BP 114/78 (BP Location: Right Arm)   Pulse 82   Temp 98.1 F (36.7 C) (Oral)   Resp 18   LMP 09/30/2023   SpO2 95%   Visual Acuity Right Eye Distance:   Left Eye Distance:   Bilateral Distance:    Right Eye Near:   Left Eye Near:    Bilateral Near:     Physical Exam Vitals and nursing note reviewed.  Constitutional:      General: She is not in acute distress.    Appearance: She is well-developed.  HENT:     Head: Normocephalic and atraumatic.     Right Ear: Tympanic membrane, ear canal and external ear normal.     Left Ear: Tympanic membrane, ear canal and external ear normal.     Nose: Congestion present.     Right Turbinates: Enlarged and swollen.     Left Turbinates: Enlarged and swollen.     Right Sinus: Maxillary sinus tenderness and frontal sinus tenderness present.     Left Sinus:  Maxillary sinus tenderness and frontal sinus tenderness present.     Mouth/Throat:     Lips: Pink.     Mouth: Mucous membranes are moist.     Pharynx: Uvula midline. Posterior oropharyngeal erythema and postnasal drip present. No oropharyngeal exudate.     Tonsils: No tonsillar exudate.      Comments: Cobblestoning present to posterior oropharynx  Eyes:     Extraocular Movements: Extraocular movements intact.     Conjunctiva/sclera: Conjunctivae normal.     Pupils: Pupils are equal, round, and reactive to light.  Cardiovascular:     Rate and Rhythm: Normal rate and regular rhythm.     Pulses: Normal pulses.     Heart sounds: Normal heart sounds.  Pulmonary:     Effort: Pulmonary effort is normal. No respiratory distress.     Breath sounds: Normal breath sounds. No stridor. No wheezing, rhonchi or rales.  Abdominal:     General: Bowel sounds are normal.     Palpations: Abdomen is soft.     Tenderness: There is no abdominal tenderness.  Musculoskeletal:     Cervical back: Normal range of motion.  Lymphadenopathy:     Cervical: No cervical adenopathy.  Skin:    General: Skin is warm and dry.  Neurological:     General: No focal deficit present.     Mental Status: She is alert and oriented to person, place, and time.  Psychiatric:        Mood and Affect: Mood normal.        Behavior: Behavior normal.      UC Treatments / Results  Labs (all labs ordered are listed, but only abnormal results are displayed) Labs Reviewed - No data to display  EKG   Radiology No results found.  Procedures Procedures (including critical care time)  Medications Ordered in UC Medications - No data to display  Initial Impression / Assessment and Plan / UC Course  I have reviewed the triage vital signs and the nursing notes.  Pertinent labs & imaging results that were available during my care of the patient were reviewed by me and considered in my medical decision making (see chart for details).  On exam, patient with moderate maxillary and frontal sinus tenderness.  Cough is worsened; room air sats are 95%, lung sounds are clear throughout.  Will treat patient for acute pansinusitis with Augmentin 875/125 mg.  For her cough, Bromfed-DM was prescribed along with prednisone 40 mg.   Patient was also prescribed fluconazole for yeast infections with antibiotic treatment.  Supportive care recommendations were provided and discussed with the patient to include increasing fluids, rest, over-the-counter analgesics, and use of a humidifier in her bedroom at nighttime during sleep.  Patient was given indication open follow-up be necessary.  Patient is in agreement with this plan of care and verbalized understanding.  All questions were answered.  Patient stable for discharge.  Work note was provided.  Final Clinical Impressions(s) / UC Diagnoses   Final diagnoses:  Acute pansinusitis, recurrence not specified  Cough, unspecified type     Discharge Instructions      Take medication as directed. Continue your current allergy medication regimen.  Also continue Flonase daily as directed. Increase fluids and get plenty of rest. May take over-the-counter ibuprofen or Tylenol as needed for pain, fever, or general discomfort. Recommend normal saline nasal spray to help with nasal congestion throughout the day. For your cough, it may be helpful to use a humidifier at bedtime during  sleep. As discussed, if symptoms do not improve with this treatment, you may follow-up in this clinic or with your primary care physician for further evaluation. Follow-up as needed.     ED Prescriptions     Medication Sig Dispense Auth. Provider   amoxicillin-clavulanate (AUGMENTIN) 875-125 MG tablet Take 1 tablet by mouth every 12 (twelve) hours. 14 tablet Leath-Warren, Sadie Haber, NP   predniSONE (DELTASONE) 20 MG tablet Take 2 tablets (40 mg total) by mouth daily with breakfast for 5 days. 10 tablet Leath-Warren, Sadie Haber, NP   brompheniramine-pseudoephedrine-DM 30-2-10 MG/5ML syrup Take 5 mLs by mouth 4 (four) times daily as needed. 140 mL Leath-Warren, Sadie Haber, NP   fluconazole (DIFLUCAN) 150 MG tablet Take 1 tablet by mouth today.  May repeat in 72 hours if symptoms continue to persist. 2  tablet Leath-Warren, Sadie Haber, NP      PDMP not reviewed this encounter.   Abran Cantor, NP 10/23/23 [phone]

## 2023-10-27 ENCOUNTER — Other Ambulatory Visit (HOSPITAL_COMMUNITY): Payer: Self-pay | Admitting: Psychiatry

## 2023-10-29 ENCOUNTER — Other Ambulatory Visit: Payer: Self-pay | Admitting: Family Medicine

## 2023-10-29 DIAGNOSIS — E559 Vitamin D deficiency, unspecified: Secondary | ICD-10-CM

## 2023-10-30 NOTE — Telephone Encounter (Signed)
Call for appt

## 2023-10-30 NOTE — Telephone Encounter (Signed)
Called pt no answer left vm 

## 2023-11-21 ENCOUNTER — Telehealth (HOSPITAL_COMMUNITY): Payer: Self-pay | Admitting: *Deleted

## 2023-11-21 ENCOUNTER — Other Ambulatory Visit (HOSPITAL_COMMUNITY): Payer: Self-pay | Admitting: Psychiatry

## 2023-11-21 ENCOUNTER — Encounter (HOSPITAL_COMMUNITY): Payer: Self-pay | Admitting: Psychiatry

## 2023-11-21 MED ORDER — AMPHETAMINE-DEXTROAMPHETAMINE 20 MG PO TABS: 20.0000 mg | ORAL_TABLET | Freq: Two times a day (BID) | ORAL | 0 refills | Status: DC

## 2023-11-21 NOTE — Telephone Encounter (Signed)
sent 

## 2023-11-21 NOTE — Telephone Encounter (Signed)
Patient called stating she is needing refills for her Adderall. Patient is scheduled for 12-05-23. Patient would like refill sent to [facility] in [place] off of [address].

## 2023-12-05 ENCOUNTER — Telehealth (HOSPITAL_COMMUNITY): Payer: Medicaid Other | Admitting: Psychiatry

## 2023-12-05 ENCOUNTER — Encounter (HOSPITAL_COMMUNITY): Payer: Self-pay | Admitting: Psychiatry

## 2023-12-05 DIAGNOSIS — F4310 Post-traumatic stress disorder, unspecified: Secondary | ICD-10-CM

## 2023-12-05 DIAGNOSIS — F331 Major depressive disorder, recurrent, moderate: Secondary | ICD-10-CM

## 2023-12-05 DIAGNOSIS — F900 Attention-deficit hyperactivity disorder, predominantly inattentive type: Secondary | ICD-10-CM

## 2023-12-05 MED ORDER — AMPHETAMINE-DEXTROAMPHETAMINE 20 MG PO TABS: 20.0000 mg | ORAL_TABLET | Freq: Two times a day (BID) | ORAL | 0 refills | Status: DC

## 2023-12-05 MED ORDER — QUETIAPINE FUMARATE 100 MG PO TABS: 100.0000 mg | ORAL_TABLET | Freq: Two times a day (BID) | ORAL | 2 refills | Status: DC

## 2023-12-05 MED ORDER — FLUOXETINE HCL 40 MG PO CAPS: ORAL_CAPSULE | ORAL | 2 refills | Status: DC

## 2023-12-05 MED ORDER — ALPRAZOLAM 1 MG PO TABS: 1.0000 mg | ORAL_TABLET | Freq: Two times a day (BID) | ORAL | 2 refills | Status: DC | PRN

## 2023-12-05 NOTE — Progress Notes (Signed)
 Virtual Visit via Video Note  I connected with Sandra Glover on 12/05/23 at  4:20 PM EST by a video enabled telemedicine application and verified that I am speaking with the correct person using two identifiers.  Location: Patient: home Provider: office   I discussed the limitations of evaluation and management by telemedicine and the availability of in person appointments. The patient expressed understanding and agreed to proceed.      I discussed the assessment and treatment plan with the patient. The patient was provided an opportunity to ask questions and all were answered. The patient agreed with the plan and demonstrated an understanding of the instructions.   The patient was advised to call back or seek an in-person evaluation if the symptoms worsen or if the condition fails to improve as anticipated.  I provided 20 minutes of non-face-to-face time during this encounter.   Barnie Gull, MD  [facility] MD/PA/NP OP Progress Note  12/05/2023 4:23 PM Sandra Glover  MRN:  [id number]  Chief Complaint:  Chief Complaint  Patient presents with   Depression   Anxiety   ADHD   Follow-up   HPI: This patient is a 37 year old married white female who lives with her husband and 2 children in [place] . 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  The patient returns for follow-up after 3 months regarding her depression anxiety and ADD as well as mood swings.  For the most part she has been doing well.  Her medical status has been good and she does not have any new issues.  Her headaches have subsided.  In terms of mood she denies significant depression or anxiety.  She is sleeping well.  She is also working on finishing her bachelor's degree online and has not been able to stay focused with the Adderall.  She denies significant depression thoughts of self-harm or suicide.  She denies significant mood swings racing thoughts or other manic symptoms.  Visit  Diagnosis:    ICD-10-CM   1. Major depressive disorder, recurrent episode, moderate (HCC)  F33.1     2. Attention deficit hyperactivity disorder (ADHD), predominantly inattentive type  F90.0     3. PTSD (post-traumatic stress disorder)  F43.10       Past Psychiatric History: Long-term outpatient treatment  Past Medical History:  Past Medical History:  Diagnosis Date   Anxiety    Anxiety    Arthritis    Depression    History of anemia 2013   History of depression    states stopped med. 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   Hypertension    Osteopenia    Pertussis    Postcoital bleeding 07/14/2015   Sinus infection 01/25/2013   started antibiotic 01/24/2013 x 10 days; nasal congestion, clear drainage from nose   Tonsillar and adenoid hypertrophy 01/2013   Vaginal irritation 03/25/2014   Mild yeast but has history of yeast, will not treat til after first trimester,try yogurt    Past Surgical History:  Procedure Laterality Date   CHOLECYSTECTOMY  04/23/2010   laparoscopic   COLONOSCOPY WITH PROPOFOL  N/A 05/17/2016   Procedure: COLONOSCOPY WITH PROPOFOL ;  Surgeon: Margo LITTIE Haddock, MD;  [facility];  Service: Endoscopy;  Laterality: N/A;  1000   HEMORRHOID BANDING N/A 05/17/2016   Procedure: HEMORRHOID BANDING;  Surgeon: Margo LITTIE Haddock, MD;  [facility];  Service: Endoscopy;  Laterality:  N/A;   HEMORRHOID SURGERY N/A 06/08/2016   Procedure: SIMPLE HEMORRHOIDECTOMY;  Surgeon: Selinda Artist Moats, MD;  Location: [facility];  Service: General;  Laterality: N/A;   PARTIAL NEPHRECTOMY Right age 22 mos.   TONSILLECTOMY AND ADENOIDECTOMY N/A 01/29/2013   Procedure: TONSILLECTOMY AND ADENOIDECTOMY;  Surgeon: Ana LELON Moccasin, MD;  Location: [facility];  Service: ENT;  Laterality: N/A;    Family Psychiatric History: See below  Family History:  Family History  Problem Relation Age of Onset   COPD Mother     Depression Mother    Anxiety disorder Mother    Hypertension Mother    Irritable bowel syndrome Mother    Arthritis Mother    Skin cancer Mother    Thyroid  cancer Mother    Ovarian cancer Maternal Aunt    Ovarian cancer Maternal Grandmother    Cancer Maternal Grandmother        bladder, kidney   Hypertension Father    GER disease Father    Bipolar disorder Paternal Uncle    Anxiety disorder Cousin    Drug abuse Cousin    Bipolar disorder Cousin    Colon cancer Neg Hx     Social History:  Social History   Socioeconomic History   Marital status: Married    Spouse name: Not on file   Number of children: Not on file   Years of education: Not on file   Highest education level: Not on file  Occupational History   Not on file  Tobacco Use   Smoking status: Never    Passive exposure: Never   Smokeless tobacco: Never   Tobacco comments:    Never smoked  Vaping Use   Vaping status: Never Used  Substance and Sexual Activity   Alcohol use: Yes    Alcohol/week: 0.0 standard drinks of alcohol    Comment: socially   Drug use: No   Sexual activity: Yes    Birth control/protection: Implant    Comment: nexplanon  implant  Other Topics Concern   Not on file  Social History Narrative   Not on file   Social Drivers of Health   Financial Resource Strain: Not on file  Food Insecurity: Not on file  Transportation Needs: Not on file  Physical Activity: Not on file  Stress: Not on file  Social Connections: Not on file    Allergies:  Allergies  Allergen Reactions   Hydrocodone-Acetaminophen  Nausea Only   Lortab [Hydrocodone-Acetaminophen ] Nausea And Vomiting   Morphine And Codeine Rash    Metabolic Disorder Labs: Lab Results  Component Value Date   HGBA1C 5.4 04/25/2023   No results found for: PROLACTIN Lab Results  Component Value Date   CHOL 193 04/25/2023   TRIG 149 04/25/2023   HDL 61 04/25/2023   CHOLHDL 3.2 04/25/2023   LDLCALC 106 (H) 04/25/2023   Lab  Results  Component Value Date   TSH 1.170 04/25/2023   TSH 2.553 10/13/2017    Therapeutic Level Labs: No results found for: LITHIUM No results found for: VALPROATE No results found for: CBMZ  Current Medications: Current Outpatient Medications  Medication Sig Dispense Refill   ALPRAZolam  (XANAX ) 1 MG tablet Take 1 tablet (1 mg total) by mouth 2 (two) times daily as needed for anxiety. 60 tablet 2   amoxicillin -clavulanate (AUGMENTIN ) 875-125 MG tablet Take 1 tablet by mouth every 12 (twelve) hours. 14 tablet 0   amphetamine -dextroamphetamine  (ADDERALL) 20 MG tablet Take 1 tablet (20 mg total) by mouth 2 (  two) times daily. 60 tablet 0   amphetamine -dextroamphetamine  (ADDERALL) 20 MG tablet Take 1 tablet (20 mg total) by mouth 2 (two) times daily. 60 tablet 0   amphetamine -dextroamphetamine  (ADDERALL) 20 MG tablet Take 1 tablet (20 mg total) by mouth 2 (two) times daily. 60 tablet 0   brompheniramine-pseudoephedrine-DM 30-2-10 MG/5ML syrup Take 5 mLs by mouth 4 (four) times daily as needed. 140 mL 0   calcium-vitamin D  (OSCAL WITH D) 500-200 MG-UNIT tablet Take 1 tablet by mouth.     Drospirenone -Estetrol  3-14.2 MG TABS Take 1 tablet by mouth daily. 28 tablet 12   etonogestrel  (NEXPLANON ) 68 MG IMPL implant 1 each by Subdermal route once.     fluconazole  (DIFLUCAN ) 150 MG tablet Take 1 tablet by mouth today.  May repeat in 72 hours if symptoms continue to persist. 2 tablet 0   FLUoxetine  (PROZAC ) 40 MG capsule TAKE 1 CAPSULE BY MOUTH EVERY DAY 30 capsule 2   gabapentin  (NEURONTIN ) 100 MG capsule Take 1 capsule (100 mg total) by mouth 3 (three) times daily. 90 capsule 2   ibuprofen  (ADVIL ) 600 MG tablet Take 1 tablet (600 mg total) by mouth every 6 (six) hours as needed. 30 tablet 0   lisinopril -hydrochlorothiazide  (ZESTORETIC ) 20-12.5 MG tablet TAKE 1 TABLET BY MOUTH DAILY 90 tablet 0   meloxicam (MOBIC) 7.5 MG tablet Take 7.5 mg by mouth daily as needed for pain.      oxyCODONE -acetaminophen  (PERCOCET/ROXICET) 5-325 MG tablet Take 1 tablet by mouth every 6 (six) hours as needed for severe pain. 6 tablet 0   pantoprazole  (PROTONIX ) 40 MG tablet TAKE 1 TABLET(40 MG) BY MOUTH TWICE DAILY 180 tablet 3   polyethylene glycol powder (GLYCOLAX /MIRALAX ) 17 GM/SCOOP powder 1-4 scoop daily or as needed 255 g 11   promethazine -dextromethorphan (PROMETHAZINE -DM) 6.25-15 MG/5ML syrup Take 5 mLs by mouth 4 (four) times daily as needed. 100 mL 0   QUEtiapine  (SEROQUEL ) 100 MG tablet Take 1 tablet (100 mg total) by mouth 2 (two) times daily. 60 tablet 2   No current facility-administered medications for this visit.     Musculoskeletal: Strength & Muscle Tone: within normal limits Gait & Station: normal Patient leans: N/A  Psychiatric Specialty Exam: Review of Systems  All other systems reviewed and are negative.   There were no vitals taken for this visit.There is no height or weight on file to calculate BMI.  General Appearance: Casual, Neat, and Well Groomed  Eye Contact:  Good  Speech:  Clear and Coherent  Volume:  Normal  Mood:  Euthymic  Affect:  Congruent  Thought Process:  Goal Directed  Orientation:  Full (Time, Place, and Person)  Thought Content: WDL and Logical   Suicidal Thoughts:  No  Homicidal Thoughts:  No  Memory:  Immediate;   Good Recent;   Good Remote;   Good  Judgement:  Good  Insight:  Good  Psychomotor Activity:  Normal  Concentration:  Concentration: Good and Attention Span: Good  Recall:  Good  Fund of Knowledge: Good  Language: Good  Akathisia:  No  Handed:  Right  AIMS (if indicated): not done  Assets:  Communication Skills Desire for Improvement Physical Health Resilience Social Support Talents/Skills  ADL's:  Intact  Cognition: WNL  Sleep:  Good   Screenings: GAD-7    Flowsheet Row Office Visit from 04/25/2023 in [facility] Primary [facility] Counselor from 09/27/2018 in [facility] at [facility]  Total GAD-7 Score 13 18  PHQ2-9    Flowsheet Row Office Visit from 04/25/2023 in [facility] Primary Care Video Visit from 07/13/2022 in [facility] Health at [place] Video Visit from 03/23/2022 in [facility] Outpatient Behavioral [facility] at [place] Video Visit from 12/10/2021 in [facility] Outpatient Behavior[facility] at [place] Video Visit from 11/12/2021 in [facility]patient Behavior[facility] Health at [facility] Total Score 3 0 0 0 2  PHQ-9 Total Score 12 -- -- -- 8      Flowsheet Row ED from 10/23/2023 in [facility] at [facility] ED from 09/28/2023 in [facility] at [facility] ED from 07/05/2023 in [facility] Emergency Department at [facility]  C-SSRS RISK CATEGORY No Risk No Risk No Risk        Assessment and Plan: This patient is a 37 year old female with a history depression anxiety ADD and possible bipolar 2 disorder.  She continues to do well on her current regimen.  She will continue Adderall 20 mg twice daily for ADD, Seroquel  100 mg twice daily for mood stabilization, Prozac  40 mg daily for depression and Xanax  1 mg twice daily as needed for anxiety.  She will return to see me in 3 months  Collaboration of Care: Collaboration of Care: Primary Care Provider AEB notes are shared with PCP on the epic system  Patient/Guardian was advised Release of Information must be obtained prior to any record release in order to collaborate their care with an outside provider. Patient/Guardian was advised if they have not already done so to contact the registration department to sign all necessary forms in order for us  to release information regarding their care.   Consent: Patient/Guardian gives verbal consent for treatment and assignment of benefits for services provided during this visit. Patient/Guardian expressed understanding and agreed to proceed.    Barnie Gull, MD 12/05/2023, 4:23  PM

## 2023-12-11 ENCOUNTER — Ambulatory Visit (INDEPENDENT_AMBULATORY_CARE_PROVIDER_SITE_OTHER): Payer: Medicaid Other | Admitting: Family Medicine

## 2023-12-11 VITALS — BP 123/87 | HR 99 | Ht 63.0 in | Wt 197.0 lb

## 2023-12-11 DIAGNOSIS — K219 Gastro-esophageal reflux disease without esophagitis: Secondary | ICD-10-CM

## 2023-12-11 DIAGNOSIS — R7301 Impaired fasting glucose: Secondary | ICD-10-CM

## 2023-12-11 DIAGNOSIS — E7849 Other hyperlipidemia: Secondary | ICD-10-CM

## 2023-12-11 DIAGNOSIS — E038 Other specified hypothyroidism: Secondary | ICD-10-CM

## 2023-12-11 DIAGNOSIS — E559 Vitamin D deficiency, unspecified: Secondary | ICD-10-CM

## 2023-12-11 DIAGNOSIS — I10 Essential (primary) hypertension: Secondary | ICD-10-CM

## 2023-12-11 DIAGNOSIS — F418 Other specified anxiety disorders: Secondary | ICD-10-CM | POA: Diagnosis not present

## 2023-12-11 MED ORDER — LISINOPRIL-HYDROCHLOROTHIAZIDE 20-12.5 MG PO TABS: 1.0000 | ORAL_TABLET | Freq: Every day | ORAL | 1 refills | Status: DC

## 2023-12-11 MED ORDER — PANTOPRAZOLE SODIUM 40 MG PO TBEC: 40.0000 mg | DELAYED_RELEASE_TABLET | Freq: Two times a day (BID) | ORAL | 3 refills | Status: DC

## 2023-12-11 NOTE — Assessment & Plan Note (Signed)
 Controlled Requested a refill of lisinopril  hydrochlorothiazide  20- 12.5 mg daily Asymptomatic A low-sodium diet of less than 2300 mg daily is recommended, along with increased physical activity of moderate intensity, aiming for 150 minutes weekly. The patient is encouraged to continue with these lifestyle modifications to help manage their blood pressure effectively.  BP Readings from Last 3 Encounters:  12/11/23 123/87  10/23/23 114/78  09/28/23 122/88

## 2023-12-11 NOTE — Patient Instructions (Addendum)
I appreciate the opportunity to provide care to you today!    Follow up:  4 months  Labs: please stop by the lab today to get your blood drawn (CBC, CMP, TSH, Lipid profile, HgA1c, Vit D)  Please schedule diabetic eye exam   Attached with your AVS, you will find valuable resources for self-education. I highly recommend dedicating some time to thoroughly examine them.   Please continue to a heart-healthy diet and increase your physical activities. Try to exercise for at least five days a week.    It was a pleasure to see you and I look forward to continuing to work together on your health and well-being. Please do not hesitate to call the office if you need care or have questions about your care.  In case of emergency, please visit the Emergency Department for urgent care, or contact our clinic at [phone] to schedule an appointment. We're here to help you!   Have a wonderful day and week. With Gratitude, Gilmore Laroche MSN, FNP-BC

## 2023-12-11 NOTE — Assessment & Plan Note (Signed)
 GERD diet encouraged Refilled protonix 40 mg twice daily

## 2023-12-11 NOTE — Assessment & Plan Note (Signed)
 The patient is under the care of psychiatry and reports doing well. She denies any suicidal thoughts during today's evaluation. She is stable on Prozac  40 mg daily. Nonpharmacological interventions, including mindfulness, deep breathing exercises, and ensuring adequate rest (7 hours of sleep daily) as well as a heart-healthy diet, were encouraged. The patient verbalized understanding and is aware of the plan of care.

## 2023-12-11 NOTE — Progress Notes (Signed)
 Established Patient Office Visit  Subjective:  Patient ID: Sandra Glover, female    DOB: 1986-05-19  Age: 38 y.o. MRN: [id number]  CC:  Chief Complaint  Patient presents with   Care Management    Follow up    HPI Sandra Glover is a 38 y.o. female with past medical history of hypertension, depression, GERD presents for f/u of  chronic medical conditions. For the details of today's visit, please refer to the assessment and plan.     Past Medical History:  Diagnosis Date   Anxiety    Anxiety    Arthritis    Depression    History of anemia 2013   History of depression    states stopped med. 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   Hypertension    Osteopenia    Pertussis    Postcoital bleeding 07/14/2015   Sinus infection 01/25/2013   started antibiotic 01/24/2013 x 10 days; nasal congestion, clear drainage from nose   Tonsillar and adenoid hypertrophy 01/2013   Vaginal irritation 03/25/2014   Mild yeast but has history of yeast, will not treat til after first trimester,try yogurt    Past Surgical History:  Procedure Laterality Date   CHOLECYSTECTOMY  04/23/2010   laparoscopic   COLONOSCOPY WITH PROPOFOL  N/A 05/17/2016   Procedure: COLONOSCOPY WITH PROPOFOL ;  Surgeon: Margo LITTIE Haddock, MD;  Location: [facility];  Service: Endoscopy;  Laterality: N/A;  1000   HEMORRHOID BANDING N/A 05/17/2016   Procedure: HEMORRHOID BANDING;  Surgeon: Margo LITTIE Haddock, MD;  Location: [facility];  Service: Endoscopy;  Laterality: N/A;   HEMORRHOID SURGERY N/A 06/08/2016   Procedure: SIMPLE HEMORRHOIDECTOMY;  Surgeon: Selinda Artist Moats, MD;  Location: [facility];  Service: General;  Laterality: N/A;   PARTIAL NEPHRECTOMY Right age 35 mos.   TONSILLECTOMY AND ADENOIDECTOMY N/A 01/29/2013   Procedure: TONSILLECTOMY AND ADENOIDECTOMY;  Surgeon: Ana LELON Moccasin, MD;  Location: [facility];  Service: ENT;  Laterality: N/A;    Family  History  Problem Relation Age of Onset   COPD Mother    Depression Mother    Anxiety disorder Mother    Hypertension Mother    Irritable bowel syndrome Mother    Arthritis Mother    Skin cancer Mother    Thyroid  cancer Mother    Ovarian cancer Maternal Aunt    Ovarian cancer Maternal Grandmother    Cancer Maternal Grandmother        bladder, kidney   Hypertension Father    GER disease Father    Bipolar disorder Paternal Uncle    Anxiety disorder Cousin    Drug abuse Cousin    Bipolar disorder Cousin    Colon cancer Neg Hx     Social History   Socioeconomic History   Marital status: Married    Spouse name: Not on file   Number of children: Not on file   Years of education: Not on file   Highest education level: Associate degree: academic program  Occupational History   Not on file  Tobacco Use   Smoking status: Never    Passive exposure: Never   Smokeless tobacco: Never   Tobacco comments:    Never smoked  Vaping Use   Vaping status: Never Used  Substance and Sexual Activity   Alcohol use: Yes    Alcohol/week: 0.0 standard drinks of alcohol    Comment: socially  Drug use: No   Sexual activity: Yes    Birth control/protection: Implant    Comment: nexplanon  implant  Other Topics Concern   Not on file  Social History Narrative   Not on file   Social Drivers of Health   Financial Resource Strain: Medium Risk (12/11/2023)   Overall Financial Resource Strain (CARDIA)    Difficulty of Paying Living Expenses: Somewhat hard  Food Insecurity: Food Insecurity Present (12/11/2023)   Hunger Vital Sign    Worried About Running Out of Food in the Last Year: Sometimes true    Ran Out of Food in the Last Year: Patient declined  Transportation Needs: No Transportation Needs (12/11/2023)   PRAPARE - Administrator, Civil Service (Medical): No    Lack of Transportation (Non-Medical): No  Physical Activity: Insufficiently Active (12/11/2023)   Exercise Vital Sign     Days of Exercise per Week: 5 days    Minutes of Exercise per Session: 20 min  Stress: Patient Declined (12/11/2023)   [employer] of Occupational Health - Occupational Stress Questionnaire    Feeling of Stress : Patient declined  Social Connections: Unknown (12/11/2023)   Social Connection and Isolation Panel [NHANES]    Frequency of Communication with Friends and Family: More than three times a week    Frequency of Social Gatherings with Friends and Family: Once a week    Attends Religious Services: More than 4 times per year    Active Member of [employer] or Organizations: Patient declined    Attends Engineer, Structural: Not on file    Marital Status: Married  Catering Manager Violence: Not on file    Outpatient Medications Prior to Visit  Medication Sig Dispense Refill   ALPRAZolam  (XANAX ) 1 MG tablet Take 1 tablet (1 mg total) by mouth 2 (two) times daily as needed for anxiety. 60 tablet 2   amphetamine -dextroamphetamine  (ADDERALL) 20 MG tablet Take 1 tablet (20 mg total) by mouth 2 (two) times daily. 60 tablet 0   amphetamine -dextroamphetamine  (ADDERALL) 20 MG tablet Take 1 tablet (20 mg total) by mouth 2 (two) times daily. 60 tablet 0   amphetamine -dextroamphetamine  (ADDERALL) 20 MG tablet Take 1 tablet (20 mg total) by mouth 2 (two) times daily. 60 tablet 0   brompheniramine-pseudoephedrine-DM 30-2-10 MG/5ML syrup Take 5 mLs by mouth 4 (four) times daily as needed. 140 mL 0   calcium-vitamin D  (OSCAL WITH D) 500-200 MG-UNIT tablet Take 1 tablet by mouth.     Drospirenone -Estetrol  3-14.2 MG TABS Take 1 tablet by mouth daily. 28 tablet 12   etonogestrel  (NEXPLANON ) 68 MG IMPL implant 1 each by Subdermal route once.     FLUoxetine  (PROZAC ) 40 MG capsule TAKE 1 CAPSULE BY MOUTH EVERY DAY 30 capsule 2   gabapentin  (NEURONTIN ) 100 MG capsule Take 1 capsule (100 mg total) by mouth 3 (three) times daily. 90 capsule 2   ibuprofen  (ADVIL ) 600 MG tablet Take 1 tablet (600 mg total) by  mouth every 6 (six) hours as needed. 30 tablet 0   meloxicam (MOBIC) 7.5 MG tablet Take 7.5 mg by mouth daily as needed for pain.     polyethylene glycol powder (GLYCOLAX /MIRALAX ) 17 GM/SCOOP powder 1-4 scoop daily or as needed 255 g 11   QUEtiapine  (SEROQUEL ) 100 MG tablet Take 1 tablet (100 mg total) by mouth 2 (two) times daily. 60 tablet 2   amoxicillin -clavulanate (AUGMENTIN ) 875-125 MG tablet Take 1 tablet by mouth every 12 (twelve) hours. 14 tablet 0  fluconazole  (DIFLUCAN ) 150 MG tablet Take 1 tablet by mouth today.  May repeat in 72 hours if symptoms continue to persist. 2 tablet 0   lisinopril -hydrochlorothiazide  (ZESTORETIC ) 20-12.5 MG tablet TAKE 1 TABLET BY MOUTH DAILY 90 tablet 0   oxyCODONE -acetaminophen  (PERCOCET/ROXICET) 5-325 MG tablet Take 1 tablet by mouth every 6 (six) hours as needed for severe pain. 6 tablet 0   pantoprazole  (PROTONIX ) 40 MG tablet TAKE 1 TABLET(40 MG) BY MOUTH TWICE DAILY 180 tablet 3   promethazine -dextromethorphan (PROMETHAZINE -DM) 6.25-15 MG/5ML syrup Take 5 mLs by mouth 4 (four) times daily as needed. 100 mL 0   No facility-administered medications prior to visit.    Allergies  Allergen Reactions   Hydrocodone-Acetaminophen  Nausea Only   Lortab [Hydrocodone-Acetaminophen ] Nausea And Vomiting   Morphine And Codeine Rash    ROS Review of Systems  Constitutional:  Negative for chills and fever.  Eyes:  Negative for visual disturbance.  Respiratory:  Negative for chest tightness and shortness of breath.   Neurological:  Negative for dizziness and headaches.      Objective:    Physical Exam HENT:     Head: Normocephalic.     Mouth/Throat:     Mouth: Mucous membranes are moist.  Cardiovascular:     Rate and Rhythm: Normal rate.     Heart sounds: Normal heart sounds.  Pulmonary:     Effort: Pulmonary effort is normal.     Breath sounds: Normal breath sounds.  Neurological:     Mental Status: She is alert.     BP 123/87   Pulse 99    Ht 5' 3 (1.6 m)   Wt 197 lb (89.4 kg)   SpO2 93%   BMI 34.90 kg/m  Wt Readings from Last 3 Encounters:  12/11/23 197 lb (89.4 kg)  08/14/23 200 lb (90.7 kg)  07/06/23 200 lb (90.7 kg)    Lab Results  Component Value Date   TSH 1.170 04/25/2023   Lab Results  Component Value Date   WBC 11.9 (H) 07/05/2023   HGB 13.4 07/05/2023   HCT 37.9 07/05/2023   MCV 94.5 07/05/2023   PLT 253 07/05/2023   Lab Results  Component Value Date   NA 133 (L) 07/05/2023   K 3.4 (L) 07/05/2023   CO2 21 (L) 07/05/2023   GLUCOSE 114 (H) 07/05/2023   BUN 9 07/05/2023   CREATININE 0.87 07/05/2023   BILITOT 0.7 07/05/2023   ALKPHOS 110 07/05/2023   AST 19 07/05/2023   ALT 20 07/05/2023   PROT 7.0 07/05/2023   ALBUMIN 4.1 07/05/2023   CALCIUM 9.0 07/05/2023   ANIONGAP 11 07/05/2023   EGFR 85 04/25/2023   Lab Results  Component Value Date   CHOL 193 04/25/2023   Lab Results  Component Value Date   HDL 61 04/25/2023   Lab Results  Component Value Date   LDLCALC 106 (H) 04/25/2023   Lab Results  Component Value Date   TRIG 149 04/25/2023   Lab Results  Component Value Date   CHOLHDL 3.2 04/25/2023   Lab Results  Component Value Date   HGBA1C 5.4 04/25/2023      Assessment & Plan:  Primary hypertension Assessment & Plan: Controlled Requested a refill of lisinopril  hydrochlorothiazide  20- 12.5 mg daily Asymptomatic A low-sodium diet of less than 2300 mg daily is recommended, along with increased physical activity of moderate intensity, aiming for 150 minutes weekly. The patient is encouraged to continue with these lifestyle modifications to help manage their blood  pressure effectively.  BP Readings from Last 3 Encounters:  12/11/23 123/87  10/23/23 114/78  09/28/23 122/88      Gastroesophageal reflux disease, unspecified whether esophagitis present Assessment & Plan: GERD diet encouraged Refilled protonix  40 mg twice daily  Orders: -     Pantoprazole  Sodium;  Take 1 tablet (40 mg total) by mouth 2 (two) times daily.  Dispense: 90 tablet; Refill: 3  Depression with anxiety Assessment & Plan: The patient is under the care of psychiatry and reports doing well. She denies any suicidal thoughts during today's evaluation. She is stable on Prozac  40 mg daily. Nonpharmacological interventions, including mindfulness, deep breathing exercises, and ensuring adequate rest (7 hours of sleep daily) as well as a heart-healthy diet, were encouraged. The patient verbalized understanding and is aware of the plan of care.    IFG (impaired fasting glucose) -     Hemoglobin A1c -     HM Diabetes Foot Exam  Vitamin D  deficiency -     VITAMIN D  25 Hydroxy (Vit-D Deficiency, Fractures) -     Lisinopril -hydroCHLOROthiazide ; Take 1 tablet by mouth daily.  Dispense: 90 tablet; Refill: 1  Other hyperlipidemia -     Lipid panel -     CMP14+EGFR -     CBC with Differential/Platelet  TSH (thyroid -stimulating hormone deficiency) -     TSH + free T4  Note: This chart has been completed using Engineer, Civil (consulting) software, and while attempts have been made to ensure accuracy, certain words and phrases may not be transcribed as intended.    Follow-up: Return in about 4 months (around 04/09/2024).   Hattye Siegfried, FNP

## 2023-12-12 ENCOUNTER — Telehealth: Payer: Self-pay | Admitting: *Deleted

## 2023-12-12 ENCOUNTER — Other Ambulatory Visit: Payer: Self-pay | Admitting: Family Medicine

## 2023-12-12 DIAGNOSIS — E559 Vitamin D deficiency, unspecified: Secondary | ICD-10-CM

## 2023-12-12 LAB — CMP14+EGFR
ALT: 19 [IU]/L (ref 0–32)
AST: 18 [IU]/L (ref 0–40)
Albumin: 4.5 g/dL (ref 3.9–4.9)
Alkaline Phosphatase: 163 [IU]/L — ABNORMAL HIGH (ref 44–121)
BUN/Creatinine Ratio: 15 (ref 9–23)
BUN: 16 mg/dL (ref 6–20)
Bilirubin Total: <0.2 mg/dL (ref 0.0–1.2)
CO2: 23 mmol/L (ref 20–29)
Calcium: 9.4 mg/dL (ref 8.7–10.2)
Chloride: 99 mmol/L (ref 96–106)
Creatinine, Ser: 1.04 mg/dL — ABNORMAL HIGH (ref 0.57–1.00)
Globulin, Total: 1.8 g/dL (ref 1.5–4.5)
Glucose: 127 mg/dL — ABNORMAL HIGH (ref 70–99)
Potassium: 4.2 mmol/L (ref 3.5–5.2)
Sodium: 139 mmol/L (ref 134–144)
Total Protein: 6.3 g/dL (ref 6.0–8.5)
eGFR: 72 mL/min/{1.73_m2} (ref >59)

## 2023-12-12 LAB — TSH+FREE T4
Free T4: 1.29 ng/dL (ref 0.82–1.77)
TSH: 1.39 u[IU]/mL (ref 0.450–4.500)

## 2023-12-12 LAB — CBC WITH DIFFERENTIAL/PLATELET
Basophils Absolute: 0.1 10*3/uL (ref 0.0–0.2)
Basos: 1 %
EOS (ABSOLUTE): 0.2 10*3/uL (ref 0.0–0.4)
Eos: 2 %
Hematocrit: 39.5 % (ref 34.0–46.6)
Hemoglobin: 13.4 g/dL (ref 11.1–15.9)
Immature Grans (Abs): 0 10*3/uL (ref 0.0–0.1)
Immature Granulocytes: 0 %
Lymphocytes Absolute: 2.2 10*3/uL (ref 0.7–3.1)
Lymphs: 22 %
MCH: 33.2 pg — ABNORMAL HIGH (ref 26.6–33.0)
MCHC: 33.9 g/dL (ref 31.5–35.7)
MCV: 98 fL — ABNORMAL HIGH (ref 79–97)
Monocytes Absolute: 0.8 10*3/uL (ref 0.1–0.9)
Monocytes: 7 %
Neutrophils Absolute: 7.1 10*3/uL — ABNORMAL HIGH (ref 1.4–7.0)
Neutrophils: 68 %
Platelets: 268 10*3/uL (ref 150–450)
RBC: 4.04 x10E6/uL (ref 3.77–5.28)
RDW: 12.1 % (ref 11.7–15.4)
WBC: 10.3 10*3/uL (ref 3.4–10.8)

## 2023-12-12 LAB — LIPID PANEL
Chol/HDL Ratio: 3.1 {ratio} (ref 0.0–4.4)
Cholesterol, Total: 187 mg/dL (ref 100–199)
HDL: 60 mg/dL (ref >39)
LDL Chol Calc (NIH): 103 mg/dL — ABNORMAL HIGH (ref 0–99)
Triglycerides: 135 mg/dL (ref 0–149)
VLDL Cholesterol Cal: 24 mg/dL (ref 5–40)

## 2023-12-12 LAB — HEMOGLOBIN A1C
Est. average glucose Bld gHb Est-mCnc: 105 mg/dL
Hgb A1c MFr Bld: 5.3 % (ref 4.8–5.6)

## 2023-12-12 LAB — VITAMIN D 25 HYDROXY (VIT D DEFICIENCY, FRACTURES): Vit D, 25-Hydroxy: 23.4 ng/mL — ABNORMAL LOW (ref 30.0–100.0)

## 2023-12-12 MED ORDER — VITAMIN D (ERGOCALCIFEROL) 1.25 MG (50000 UNIT) PO CAPS: 50000.0000 [IU] | ORAL_CAPSULE | ORAL | 1 refills | Status: AC

## 2023-12-12 NOTE — Telephone Encounter (Signed)
 Trulance was approved 12/11/2023-12/10/2024.

## 2023-12-14 ENCOUNTER — Ambulatory Visit: Payer: Medicaid Other | Admitting: Obstetrics & Gynecology

## 2023-12-14 ENCOUNTER — Encounter: Payer: Self-pay | Admitting: Obstetrics & Gynecology

## 2023-12-14 VITALS — BP 119/83 | HR 93 | Ht 63.0 in | Wt 202.0 lb

## 2023-12-14 DIAGNOSIS — Z3046 Encounter for surveillance of implantable subdermal contraceptive: Secondary | ICD-10-CM | POA: Diagnosis not present

## 2023-12-14 MED ORDER — ETONOGESTREL 68 MG ~~LOC~~ IMPL
68.0000 mg | DRUG_IMPLANT | Freq: Once | SUBCUTANEOUS | Status: AC
  Administered 2023-12-14: 68 mg via SUBCUTANEOUS

## 2023-12-14 NOTE — Progress Notes (Signed)
 Procedure note: removal The left arm is prepped in usual sterile fashion Received 3 cc of half percent Marcaine  is injected at the site of the Nexplanon  insertion The Nexplanon  is palpated without difficulty A small stab incision is made with an 11 blade Curved hemostats were used and the Nexplanon  rod was removed without difficulty    reinsertion The new sterile Nexplanon  device is removed from the packaging The Nexplanon  is inserted without difficulty It is palpated by both me and the patient Hemostasis is achieved Peri-Strips were placed in elastic bandage placed which remain in place for 2 days   Lot# [id number] 2026-10   Sandra Glover Inch, MD 12/14/2023 9:49 AM

## 2023-12-14 NOTE — Addendum Note (Signed)
 Addended by: Annamarie Dawley on: 12/14/2023 09:57 AM   Modules accepted: Orders

## 2023-12-15 ENCOUNTER — Encounter: Payer: Self-pay | Admitting: Family Medicine

## 2023-12-16 ENCOUNTER — Other Ambulatory Visit: Payer: Self-pay | Admitting: Family Medicine

## 2023-12-22 ENCOUNTER — Other Ambulatory Visit: Payer: Self-pay | Admitting: Gastroenterology

## 2023-12-22 DIAGNOSIS — K581 Irritable bowel syndrome with constipation: Secondary | ICD-10-CM

## 2023-12-22 NOTE — Telephone Encounter (Signed)
Follow up OV needed for further refills. Can be virtual.

## 2024-01-08 ENCOUNTER — Ambulatory Visit
Admission: EM | Admit: 2024-01-08 | Discharge: 2024-01-08 | Disposition: A | Discharge status: Home / Self Care | Payer: Medicaid Other | Attending: Nurse Practitioner | Admitting: Nurse Practitioner

## 2024-01-08 DIAGNOSIS — U071 COVID-19: Secondary | ICD-10-CM | POA: Diagnosis not present

## 2024-01-08 MED ORDER — BENZONATATE 100 MG PO CAPS: 100.0000 mg | ORAL_CAPSULE | Freq: Three times a day (TID) | ORAL | 0 refills | Status: DC | PRN

## 2024-01-08 MED ORDER — PROMETHAZINE-DM 6.25-15 MG/5ML PO SYRP: 5.0000 mL | ORAL_SOLUTION | Freq: Every evening | ORAL | 0 refills | Status: DC | PRN

## 2024-01-08 NOTE — ED Provider Notes (Signed)
[facility]    CSN: [id number] Arrival date & time: 01/08/24  4098      History   Chief Complaint No chief complaint on file.   HPI Sandra Glover is a 38 y.o. female.   Patient presents today with 3-day history of shortness of breath, stabbing pains in her chest when she takes a deep breath, congested cough, runny and stuffy nose, sinus pressure, headache, ear pain, diarrhea, decreased appetite, and fatigue.  She also endorses fever and bodyaches that are now improved.  No sore throat, abdominal pain, nausea, or vomiting.  Has been taking Mucinex severe cold and flu every 4 hours without much improvement in symptoms.  Reports positive at-home COVID-19 test 3 days ago, husband and daughter are also sick with similar symptoms.    Past Medical History:  Diagnosis Date   Anxiety    Anxiety    Arthritis    Depression    History of anemia 2013   History of depression    states stopped med. 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   Hypertension    Osteopenia    Pertussis    Postcoital bleeding 07/14/2015   Sinus infection 01/25/2013   started antibiotic 01/24/2013 x 10 days; nasal congestion, clear drainage from nose   Tonsillar and adenoid hypertrophy 01/2013   Vaginal irritation 03/25/2014   Mild yeast but has history of yeast, will not treat til after first trimester,try yogurt    Patient Active Problem List   Diagnosis Date Noted   GERD (gastroesophageal reflux disease) 12/11/2023   History of anemia 04/25/2023   Neck pain 04/25/2023   ADHD (attention deficit hyperactivity disorder) 04/25/2023   Tachycardia 11/10/2021   Bipolar 1 disorder (HCC) 11/10/2021   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   Acute sinusitis 06/28/2021   Major depressive disorder, recurrent episode, moderate (HCC) 06/22/2021   Pain in right arm 03/30/2021   Umbilical hernia 03/23/2021   Bipolar 1 disorder with moderate mania (HCC) 02/15/2021    Obesity 11/08/2020   Hyperlipidemia 11/08/2020   Osteoarthritis 08/05/2020   Hypertension 05/05/2020   Osteopenia 04/04/2020   Headache 12/19/2019   History of partial nephrectomy 12/01/2019   Exposure to 2019 novel coronavirus 06/06/2019   Adult ADHD 04/05/2019   Diarrhea 12/26/2018   Abdominal pain 12/26/2018   Irritable bowel syndrome 11/21/2018   Attention deficit hyperactivity disorder, 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   Anxiety 11/21/2018   Nexplanon insertion 09/25/2017   Hemorrhoids 09/19/2016   Rectal bleeding 03/24/2016   Constipation 03/24/2016   History of preterm delivery 09/09/2014   Pertussis 07/30/2014   History of gestational diabetes 07/24/2014   Cystic fibrosis carrier, 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   Depression with anxiety 03/03/2014    Past Surgical History:  Procedure Laterality Date   CHOLECYSTECTOMY  04/23/2010   laparoscopic   COLONOSCOPY WITH PROPOFOL N/A 05/17/2016   Procedure: COLONOSCOPY WITH PROPOFOL;  Surgeon: West Bali, MD;  Location: [facility];  Service: Endoscopy;  Laterality: N/A;  1000   HEMORRHOID BANDING N/A 05/17/2016   Procedure: HEMORRHOID BANDING;  Surgeon: West Bali, MD;  Location: [facility];  Service: Endoscopy;  Laterality: N/A;   HEMORRHOID SURGERY N/A 06/08/2016   Procedure: SIMPLE HEMORRHOIDECTOMY;  Surgeon: Ancil Linsey, MD;  Location: [facility];  Service: General;  Laterality: N/A;   PARTIAL NEPHRECTOMY Right age 76 mos.   TONSILLECTOMY AND ADENOIDECTOMY N/A 01/29/2013   Procedure: TONSILLECTOMY AND  ADENOIDECTOMY;  Surgeon: Darletta Moll, MD;  Location: [facility];  Service: ENT;  Laterality: N/A;    OB History     Gravida  2   Para  2   Term  1   Preterm  1   AB      Living  2      SAB      IAB      Ectopic      Multiple      Live Births  2            Home Medications    Prior to Admission medications   Medication Sig Start Date End Date Taking? Authorizing  Provider  benzonatate (TESSALON) 100 MG capsule Take 1 capsule (100 mg total) by mouth 3 (three) times daily as needed for cough. Do not take with alcohol or while operating or driving heavy machinery 12/10/08  Yes Valentino Nose, NP  promethazine-dextromethorphan (PROMETHAZINE-DM) 6.25-15 MG/5ML syrup Take 5 mLs by mouth at bedtime as needed for cough. Do not take with alcohol or while driving or operating heavy machinery.  May cause drowsiness. 01/08/24  Yes Valentino Nose, NP  ALPRAZolam Prudy Feeler) 1 MG tablet Take 1 tablet (1 mg total) by mouth 2 (two) times daily as needed for anxiety. 12/05/23 12/04/24  Myrlene Broker, MD  amphetamine-dextroamphetamine (ADDERALL) 20 MG tablet Take 1 tablet (20 mg total) by mouth 2 (two) times daily. 12/05/23 12/04/24  Myrlene Broker, MD  amphetamine-dextroamphetamine (ADDERALL) 20 MG tablet Take 1 tablet (20 mg total) by mouth 2 (two) times daily. Patient not taking: Reported on 12/14/2023 12/05/23 12/04/24  Myrlene Broker, MD  amphetamine-dextroamphetamine (ADDERALL) 20 MG tablet Take 1 tablet (20 mg total) by mouth 2 (two) times daily. Patient not taking: Reported on 12/14/2023 12/05/23 12/04/24  Myrlene Broker, MD  calcium-vitamin D (OSCAL WITH D) 500-200 MG-UNIT tablet Take 1 tablet by mouth.    [provider]  Drospirenone-Estetrol 3-14.2 MG TABS Take 1 tablet by mouth daily. 12/22/21   Lazaro Arms, MD  etonogestrel (NEXPLANON) 68 MG IMPL implant 1 each by Subdermal route once.    [provider]  FLUoxetine (PROZAC) 40 MG capsule TAKE 1 CAPSULE BY MOUTH EVERY DAY 12/05/23   Myrlene Broker, MD  gabapentin (NEURONTIN) 100 MG capsule Take 1 capsule (100 mg total) by mouth 3 (three) times daily. 07/25/23   Vickki Hearing, MD  ibuprofen (ADVIL) 600 MG tablet Take 1 tablet (600 mg total) by mouth every 6 (six) hours as needed. Patient not taking: Reported on 12/14/2023 12/14/22   Jacalyn Lefevre, MD  lisinopril-hydrochlorothiazide  (ZESTORETIC) 20-12.5 MG tablet Take 1 tablet by mouth daily. 12/11/23   Gilmore Laroche, FNP  meloxicam (MOBIC) 7.5 MG tablet Take 7.5 mg by mouth daily as needed for pain. 08/15/19   [provider]  pantoprazole (PROTONIX) 40 MG tablet Take 1 tablet (40 mg total) by mouth 2 (two) times daily. 12/11/23   Gilmore Laroche, FNP  Plecanatide (TRULANCE) 3 MG TABS TAKE 1 TABLET BY MOUTH DAILY 12/22/23 02/20/24  Aida Raider, NP  polyethylene glycol powder (GLYCOLAX/MIRALAX) 17 GM/SCOOP powder 1-4 scoop daily or as needed 12/17/21   Lazaro Arms, MD  QUEtiapine (SEROQUEL) 100 MG tablet Take 1 tablet (100 mg total) by mouth 2 (two) times daily. 12/05/23   Myrlene Broker, MD  Vitamin D, Ergocalciferol, (DRISDOL) 1.25 MG (50000 UNIT) CAPS capsule Take 1 capsule (50,000 Units total)  by mouth every 7 (seven) days. Patient not taking: Reported on 12/14/2023 12/12/23   Gilmore Laroche, FNP    Family History Family History  Problem Relation Age of Onset   COPD Mother    Depression Mother    Anxiety disorder Mother    Hypertension Mother    Irritable bowel syndrome Mother    Arthritis Mother    Skin cancer Mother    Thyroid cancer Mother    Ovarian cancer Maternal Aunt    Ovarian cancer Maternal Grandmother    Cancer Maternal Grandmother        bladder, kidney   Hypertension Father    GER disease Father    Bipolar disorder Paternal Uncle    Anxiety disorder Cousin    Drug abuse Cousin    Bipolar disorder Cousin    Colon cancer Neg Hx     Social History Social History   Tobacco Use   Smoking status: Never    Passive exposure: Never   Smokeless tobacco: Never   Tobacco comments:    Never smoked  Vaping Use   Vaping status: Never Used  Substance Use Topics   Alcohol use: Yes    Alcohol/week: 0.0 standard drinks of alcohol    Comment: socially   Drug use: No     Allergies   Hydrocodone-acetaminophen, Lortab [hydrocodone-acetaminophen], and Morphine and codeine   Review of  Systems Review of Systems Per HPI  Physical Exam Triage Vital Signs ED Triage Vitals  Encounter Vitals Group     BP 01/08/24 0949 120/86     Systolic BP Percentile --      Diastolic BP Percentile --      Pulse Rate 01/08/24 0949 93     Resp 01/08/24 0949 16     Temp 01/08/24 0949 98.3 F (36.8 C)     Temp Source 01/08/24 0949 Oral     SpO2 01/08/24 0949 98 %     Weight --      Height --      Head Circumference --      Peak Flow --      Pain Score 01/08/24 0952 0     Pain Loc --      Pain Education --      Exclude from Growth Chart --    No data found.  Updated Vital Signs BP 120/86 (BP Location: Right Arm)   Pulse 93   Temp 98.3 F (36.8 C) (Oral)   Resp 16   LMP 12/13/2023 (Exact Date)   SpO2 98%   Visual Acuity Right Eye Distance:   Left Eye Distance:   Bilateral Distance:    Right Eye Near:   Left Eye Near:    Bilateral Near:     Physical Exam Vitals and nursing note reviewed.  Constitutional:      General: She is not in acute distress.    Appearance: Normal appearance. She is ill-appearing. She is not toxic-appearing.  HENT:     Head: Normocephalic and atraumatic.     Right Ear: Tympanic membrane, ear canal and external ear normal.     Left Ear: Tympanic membrane, ear canal and external ear normal.     Nose: Congestion present. No rhinorrhea.     Right Sinus: Maxillary sinus tenderness and frontal sinus tenderness present.     Left Sinus: Maxillary sinus tenderness and frontal sinus tenderness present.     Mouth/Throat:     Mouth: Mucous membranes are moist.     Pharynx:  Oropharynx is clear. No oropharyngeal exudate or posterior oropharyngeal erythema.  Eyes:     General: No scleral icterus.    Extraocular Movements: Extraocular movements intact.  Cardiovascular:     Rate and Rhythm: Normal rate and regular rhythm.  Pulmonary:     Effort: Pulmonary effort is normal. No respiratory distress.     Breath sounds: Normal breath sounds. No wheezing,  rhonchi or rales.  Musculoskeletal:     Cervical back: Normal range of motion and neck supple.  Lymphadenopathy:     Cervical: No cervical adenopathy.  Skin:    General: Skin is warm and dry.     Coloration: Skin is not jaundiced or pale.     Findings: No erythema or rash.  Neurological:     Mental Status: She is alert and oriented to person, place, and time.  Psychiatric:        Behavior: Behavior is cooperative.      UC Treatments / Results  Labs (all labs ordered are listed, but only abnormal results are displayed) Labs Reviewed - No data to display  EKG   Radiology No results found.  Procedures Procedures (including critical care time)  Medications Ordered in UC Medications - No data to display  Initial Impression / Assessment and Plan / UC Course  I have reviewed the triage vital signs and the nursing notes.  Pertinent labs & imaging results that were available during my care of the patient were reviewed by me and considered in my medical decision making (see chart for details).   Patient is well-appearing, normotensive, afebrile, not tachycardic, not tachypneic, oxygenating well on room air.    1. COVID-19 We discussed his symptoms are consistent with mild COVID-19 Vitals and exam are reassuring today Low suspicion for bacterial sinusitis, discussed this with patient Recommended continuing Mucinex, start cough suppressant medication ER and return precautions discussed Work excuse provided   The patient was given the opportunity to ask questions.  All questions answered to their satisfaction.  The patient is in agreement to this plan.    Final Clinical Impressions(s) / UC Diagnoses   Final diagnoses:  COVID-19     Discharge Instructions      Your symptoms are consistent with mild COVID-19.  Symptoms should improve over the next week to 10 days.  If chest pain or shortness of breath worsens, go to the emergency room.  Some things that can make you  feel better are: - Increased rest - Increasing fluid with water/sugar free electrolytes - Acetaminophen and ibuprofen as needed for fever/pain - Salt water gargling, chloraseptic spray and throat lozenges for sore throat - OTC guaifenesin (Mucinex) 600 mg twice daily for congestion - Saline sinus flushes or a neti pot - Humidifying the air -Tessalon Perles every 8 hours as needed for dry cough and cough syrup at night time as needed     ED Prescriptions     Medication Sig Dispense Auth. Provider   benzonatate (TESSALON) 100 MG capsule Take 1 capsule (100 mg total) by mouth 3 (three) times daily as needed for cough. Do not take with alcohol or while operating or driving heavy machinery 21 capsule Cathlean Marseilles A, NP   promethazine-dextromethorphan (PROMETHAZINE-DM) 6.25-15 MG/5ML syrup Take 5 mLs by mouth at bedtime as needed for cough. Do not take with alcohol or while driving or operating heavy machinery.  May cause drowsiness. 118 mL Valentino Nose, NP      PDMP not reviewed this encounter.   Bradly Bienenstock,  Guadlupe Spanish, NP 01/08/24 1113

## 2024-01-08 NOTE — ED Triage Notes (Signed)
Pt reports she tested positive for COVID,x 4 days has been experiencing cough congestion body aches, headache, fatigue, ear pain sinus drainage SOB.

## 2024-01-08 NOTE — Discharge Instructions (Signed)
Your symptoms are consistent with mild COVID-19.  Symptoms should improve over the next week to 10 days.  If chest pain or shortness of breath worsens, go to the emergency room.  Some things that can make you feel better are: - Increased rest - Increasing fluid with water/sugar free electrolytes - Acetaminophen and ibuprofen as needed for fever/pain - Salt water gargling, chloraseptic spray and throat lozenges for sore throat - OTC guaifenesin (Mucinex) 600 mg twice daily for congestion - Saline sinus flushes or a neti pot - Humidifying the air -Tessalon Perles every 8 hours as needed for dry cough and cough syrup at night time as needed

## 2024-03-19 ENCOUNTER — Other Ambulatory Visit (HOSPITAL_COMMUNITY): Payer: Self-pay | Admitting: Psychiatry

## 2024-03-19 NOTE — Telephone Encounter (Signed)
 Call for appt

## 2024-03-26 ENCOUNTER — Ambulatory Visit: Admitting: Gastroenterology

## 2024-04-03 ENCOUNTER — Other Ambulatory Visit (HOSPITAL_COMMUNITY): Payer: Self-pay | Admitting: Psychiatry

## 2024-04-03 ENCOUNTER — Telehealth (HOSPITAL_COMMUNITY): Payer: Self-pay

## 2024-04-03 MED ORDER — AMPHETAMINE-DEXTROAMPHETAMINE 20 MG PO TABS: 20.0000 mg | ORAL_TABLET | Freq: Two times a day (BID) | ORAL | 0 refills | Status: DC

## 2024-04-03 NOTE — Telephone Encounter (Signed)
 Medication refill and Appointment - Call from patient requesting a new Adderall 20 mg order be sent into her [facility] in [place], [place]. Patient last seen 12/05/23 with instruction to return in 3 months but has not been seen since and no current appt scheduled.

## 2024-04-04 NOTE — Telephone Encounter (Signed)
 [email or web address]

## 2024-04-09 ENCOUNTER — Ambulatory Visit: Payer: Medicaid Other | Admitting: Family Medicine

## 2024-04-11 NOTE — Telephone Encounter (Signed)
 [email or web address]

## 2024-04-16 ENCOUNTER — Ambulatory Visit: Admitting: Gastroenterology

## 2024-04-17 ENCOUNTER — Other Ambulatory Visit: Payer: Self-pay | Admitting: Gastroenterology

## 2024-04-17 DIAGNOSIS — K581 Irritable bowel syndrome with constipation: Secondary | ICD-10-CM

## 2024-04-22 NOTE — Progress Notes (Signed)
 GI Office Note    Referring Provider: Zarwolo, Gloria, FNP Primary Care Physician:  Zarwolo, Gloria, FNP Primary Gastroenterologist: Rolando Cliche. Mordechai April, DO  Date:  04/23/2024  ID:  Sandra Glover, DOB 1986-05-27, MRN [id number]   Chief Complaint   Chief Complaint  Patient presents with   Follow-up    Follow up. No problems    History of Present Illness  Sandra Glover is a 38 y.o. female with a history of anxiety/depression, anemia, HTN, GERD, and IBS presenting today for follow up/medication refill.   Last colonoscopy 2017 - moderately redundant colon, rectal bleeding/pain likely due to external and internal hemorrhoids, repeat colonoscopy due at age 83/50.  OV 03/12/2019 for follow up of rectal bleeding, abdominal pain, and diarrhea. Reported a previous history of constipation and grade 3 hemorrhoids. Recommended surgical referral for hemorrhoids, was using [facility] hemorrhoid cream. Was doing better at this time, on amitiza . Denied any upper or lower GI symptoms. Previous ER visit to this office visit with normal labs and negative GI pathogen panel.   Patient sent a message in July 2020 asking about generic drugs to use as she was unable to afford Linzess  or Amitiza .   Last office visit July 2023. Reported her pain felt like when she had gallbladder attacks. Starts in epigastric region and was radiating to right shoulder. Takes her breath away due to the severity and makes her feel like vomiting. Attacks occurring more frequently. Worse with grease and leafy veggies. Associated burning and was taking omeprazole  2 hrs prior to bed. Denied dysphagia. Noted some constipation with BM every 4-5 days. Using miralax  every once in a while. Started on pantoprazole  40 mg daily, Amitiza  24 mcg BID with food. Also advised simethicone  as needed.   Amitiza  was denied - provided samples of trulance  and she requested prescription.      Latest Ref Rng & Units 12/11/2023    3:32 PM  07/05/2023    4:37 PM 04/25/2023   10:51 AM  CMP  Glucose 70 - 99 mg/dL 952  841  88   BUN 6 - 20 mg/dL 16  9  9    Creatinine 0.57 - 1.00 mg/dL 3.24  4.01  0.27   Sodium 134 - 144 mmol/L 139  133  140   Potassium 3.5 - 5.2 mmol/L 4.2  3.4  4.1   Chloride 96 - 106 mmol/L 99  101  103   CO2 20 - 29 mmol/L 23  21  21    Calcium 8.7 - 10.2 mg/dL 9.4  9.0  9.8   Total Protein 6.0 - 8.5 g/dL 6.3  7.0  6.6   Total Bilirubin 0.0 - 1.2 mg/dL <2.5  0.7  <3.6   Alkaline Phos 44 - 121 IU/L 163  110  124   AST 0 - 40 IU/L 18  19  20    ALT 0 - 32 IU/L 19  20  14       Today:  Trulance  is doing well and she is going daily. Has bene having more gas and bloating but attributes that to diet and weight gain.   Taking pantoprazole  40 mg once daily currently - felt like the omeprazole  was not doing well for her so PCP switched her.   Has had some rectal bleeding but is sporadic and mostly with toilet tissue. Has done hemorrhoid banding before her last colonoscopy 2014/2015. Does not use anything over the counter. Bleeding usually occurs after more acidic food consumption at her next  BM.   Wt Readings from Last 3 Encounters:  04/23/24 198 lb 6.4 oz (90 kg)  12/14/23 202 lb (91.6 kg)  12/11/23 197 lb (89.4 kg)    Current Outpatient Medications  Medication Sig Dispense Refill   ALPRAZolam  (XANAX ) 1 MG tablet Take 1 tablet (1 mg total) by mouth 2 (two) times daily as needed for anxiety. 60 tablet 2   amphetamine -dextroamphetamine  (ADDERALL) 20 MG tablet Take 1 tablet (20 mg total) by mouth 2 (two) times daily. 60 tablet 0   calcium-vitamin D  (OSCAL WITH D) 500-200 MG-UNIT tablet Take 1 tablet by mouth.     etonogestrel  (NEXPLANON ) 68 MG IMPL implant 1 each by Subdermal route once.     FLUoxetine  (PROZAC ) 40 MG capsule TAKE 1 CAPSULE BY MOUTH EVERY DAY 30 capsule 2   gabapentin  (NEURONTIN ) 100 MG capsule Take 1 capsule (100 mg total) by mouth 3 (three) times daily. 90 capsule 2    lisinopril -hydrochlorothiazide  (ZESTORETIC ) 20-12.5 MG tablet Take 1 tablet by mouth daily. 90 tablet 1   meloxicam (MOBIC) 7.5 MG tablet Take 7.5 mg by mouth daily as needed for pain.     pantoprazole  (PROTONIX ) 40 MG tablet Take 1 tablet (40 mg total) by mouth 2 (two) times daily. 90 tablet 3   QUEtiapine  (SEROQUEL ) 100 MG tablet TAKE 1 TABLET(100 MG) BY MOUTH TWICE DAILY 60 tablet 2   TRULANCE  3 MG TABS TAKE 1 TABLET BY MOUTH DAILY 30 tablet 0   Vitamin D , Ergocalciferol , (DRISDOL ) 1.25 MG (50000 UNIT) CAPS capsule Take 1 capsule (50,000 Units total) by mouth every 7 (seven) days. 20 capsule 1   amphetamine -dextroamphetamine  (ADDERALL) 20 MG tablet Take 1 tablet (20 mg total) by mouth 2 (two) times daily. (Patient not taking: Reported on 12/14/2023) 60 tablet 0   amphetamine -dextroamphetamine  (ADDERALL) 20 MG tablet Take 1 tablet (20 mg total) by mouth 2 (two) times daily. (Patient not taking: Reported on 12/14/2023) 60 tablet 0   benzonatate  (TESSALON ) 100 MG capsule Take 1 capsule (100 mg total) by mouth 3 (three) times daily as needed for cough. Do not take with alcohol or while operating or driving heavy machinery (Patient not taking: Reported on 04/23/2024) 21 capsule 0   Drospirenone -Estetrol  3-14.2 MG TABS Take 1 tablet by mouth daily. (Patient not taking: Reported on 04/23/2024) 28 tablet 12   ibuprofen  (ADVIL ) 600 MG tablet Take 1 tablet (600 mg total) by mouth every 6 (six) hours as needed. (Patient not taking: Reported on 04/23/2024) 30 tablet 0   polyethylene glycol powder (GLYCOLAX /MIRALAX ) 17 GM/SCOOP powder 1-4 scoop daily or as needed (Patient not taking: Reported on 04/23/2024) 255 g 11   promethazine -dextromethorphan (PROMETHAZINE -DM) 6.25-15 MG/5ML syrup Take 5 mLs by mouth at bedtime as needed for cough. Do not take with alcohol or while driving or operating heavy machinery.  May cause drowsiness. (Patient not taking: Reported on 04/23/2024) 118 mL 0   No current facility-administered  medications for this visit.    Past Medical History:  Diagnosis Date   Anxiety    Anxiety    Arthritis    Depression    History of anemia 2013   History of depression    states stopped med. 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   Hypertension    Osteopenia    Pertussis    Postcoital bleeding 07/14/2015   Sinus infection 01/25/2013   started antibiotic 01/24/2013 x 10 days; nasal congestion, clear  drainage from nose   Tonsillar and adenoid hypertrophy 01/2013   Vaginal irritation 03/25/2014   Mild yeast but has history of yeast, will not treat til after first trimester,try yogurt    Past Surgical History:  Procedure Laterality Date   CHOLECYSTECTOMY  04/23/2010   laparoscopic   COLONOSCOPY WITH PROPOFOL  N/A 05/17/2016   Procedure: COLONOSCOPY WITH PROPOFOL ;  Surgeon: Alyce Jubilee, MD;  Location: [facility];  Service: Endoscopy;  Laterality: N/A;  1000   HEMORRHOID BANDING N/A 05/17/2016   Procedure: HEMORRHOID BANDING;  Surgeon: Alyce Jubilee, MD;  Location: [facility];  Service: Endoscopy;  Laterality: N/A;   HEMORRHOID SURGERY N/A 06/08/2016   Procedure: SIMPLE HEMORRHOIDECTOMY;  Surgeon: Franki Isles, MD;  Location: [facility];  Service: General;  Laterality: N/A;   PARTIAL NEPHRECTOMY Right age 67 mos.   TONSILLECTOMY AND ADENOIDECTOMY N/A 01/29/2013   Procedure: TONSILLECTOMY AND ADENOIDECTOMY;  Surgeon: Lawence Press, MD;  Location: [facility];  Service: ENT;  Laterality: N/A;    Family History  Problem Relation Age of Onset   COPD Mother    Depression Mother    Anxiety disorder Mother    Hypertension Mother    Irritable bowel syndrome Mother    Arthritis Mother    Skin cancer Mother    Thyroid  cancer Mother    Ovarian cancer Maternal Aunt    Ovarian cancer Maternal Grandmother    Cancer Maternal Grandmother        bladder, kidney   Hypertension Father    GER disease Father    Bipolar disorder  Paternal Uncle    Anxiety disorder Cousin    Drug abuse Cousin    Bipolar disorder Cousin    Colon cancer Neg Hx     Allergies as of 04/23/2024 - Review Complete 04/23/2024  Allergen Reaction Noted   Hydrocodone-acetaminophen  Nausea Only 04/25/2023   Lortab [hydrocodone-acetaminophen ] Nausea And Vomiting 01/29/2013   Morphine and codeine Rash 01/25/2013    Social History   Socioeconomic History   Marital status: Married    Spouse name: Not on file   Number of children: Not on file   Years of education: Not on file   Highest education level: Associate degree: academic program  Occupational History   Not on file  Tobacco Use   Smoking status: Never    Passive exposure: Never   Smokeless tobacco: Never   Tobacco comments:    Never smoked  Vaping Use   Vaping status: Never Used  Substance and Sexual Activity   Alcohol use: Yes    Alcohol/week: 0.0 standard drinks of alcohol    Comment: socially   Drug use: No   Sexual activity: Yes    Birth control/protection: Implant    Comment: nexplanon  implant  Other Topics Concern   Not on file  Social History Narrative   Not on file   Social Drivers of Health   Financial Resource Strain: Medium Risk (12/11/2023)   Overall Financial Resource Strain (CARDIA)    Difficulty of Paying Living Expenses: Somewhat hard  Food Insecurity: Food Insecurity Present (12/11/2023)   Hunger Vital Sign    Worried About Running Out of Food in the Last Year: Sometimes true    Ran Out of Food in the Last Year: Patient declined  Transportation Needs: No Transportation Needs (12/11/2023)   PRAPARE - Administrator, Civil Service (Medical): No    Lack of Transportation (Non-Medical): No  Physical Activity: Insufficiently Active (  12/11/2023)   Exercise Vital Sign    Days of Exercise per Week: 5 days    Minutes of Exercise per Session: 20 min  Stress: Patient Declined (12/11/2023)   [employer] of Occupational Health - Occupational Stress  Questionnaire    Feeling of Stress : Patient declined  Social Connections: Unknown (12/11/2023)   Social Connection and Isolation Panel [NHANES]    Frequency of Communication with Friends and Family: More than three times a week    Frequency of Social Gatherings with Friends and Family: Once a week    Attends Religious Services: More than 4 times per year    Active Member of [employer] or Organizations: Patient declined    Attends Engineer, structural: Not on file    Marital Status: Married     Review of Systems   Gen: Denies fever, chills, anorexia. Denies fatigue, weakness, weight loss.  CV: Denies chest pain, palpitations, syncope, peripheral edema, and claudication. Resp: Denies dyspnea at rest, cough, wheezing, coughing up blood, and pleurisy. GI: See HPI Derm: Denies rash, itching, dry skin Psych: Denies depression, anxiety, memory loss, confusion. No homicidal or suicidal ideation.  Heme: Denies bruising, bleeding, and enlarged lymph nodes.  Physical Exam   BP 122/83 (BP Location: Right Arm, Patient Position: Sitting, Cuff Size: Large)   Pulse 99   Temp 97.6 F (36.4 C) (Temporal)   Ht 5\' 3"  (1.6 m)   Wt 198 lb 6.4 oz (90 kg)   LMP 03/28/2024 (Approximate)   BMI 35.14 kg/m   General:   Alert and oriented. No distress noted. Pleasant and cooperative.  Head:  Normocephalic and atraumatic. Eyes:  Conjuctiva clear without scleral icterus. Mouth:  Oral mucosa pink and moist. Good dentition. No lesions. Lungs:  Clear to auscultation bilaterally. No wheezes, rales, or rhonchi. No distress.  Heart:  S1, S2 present without murmurs appreciated.  Abdomen:  +BS, soft, non-tender and non-distended. No rebound or guarding. No HSM or masses noted. Rectal: deferred  Msk:  Symmetrical without gross deformities. Normal posture. Extremities:  Without edema. Neurologic:  Alert and  oriented x4 Psych:  Alert and cooperative. Normal mood and affect.  Assessment  Sandra Glover  is a 38 y.o. female with a history of anxiety/depression, anemia, HTN, GERD, and IBS presenting today for follow up/medication refill.   GERD: Currently without nausea, vomiting, dysphagia, or abdominal pain.  Previously was on omeprazole  however her PCP switched her to pantoprazole  and she is currently only taking daily rather than twice daily.  Symptoms have been fairly well-controlled.  IBS-C: Currently doing well on Trulance  with a daily bowel movement.  Denies any significant straining or incomplete emptying.  Has had some mild return of gassiness and bloating recently however she suspects this may be secondary to diet as well as weight gain. Again advised on avoidance of common gas producing foods and advised that she could trial Gas-X, Phazyme, or Beano as needed.  Hemorrhoids, rectal bleeding: Has had issues chronically with hemorrhoids.  Hemorrhoids noted the last colonoscopy in 2017.  She reports prior history of hemorrhoid banding in about 2014/2015 with Dr. Nolene Baumgarten.  Has had a few instances of toilet tissue hematochezia.  Advise she can use over-the-counter hemorrhoid cream as needed, Preparation H or rectal hydrocortisone .  Advise she could reconsider additional banding if needed.  Elevated alk phos: Has been elevated in the past to upper 120s and most recently in January up to 163.  All other LFTs and T. bili within normal  limits.  CT scan in July 2024 without any liver abnormalities.  Given known osteopenia this could be somewhat of a bone source however will check for liver source with repeat CMP and fasting GGT in 6 months.  PLAN   Pantoprazole  40 mg once daily. Trulance  3 mg daily. Refilled.  GERD diet High fiber diet CMP and GGT fasting in 6 months Could consider repeat hemorrhoid banding OTC hemorrhoid cream as needed.  Follow up in 1-2 years  Julian Obey, MSN, FNP-BC, AGACNP-BC [facility]

## 2024-04-23 ENCOUNTER — Ambulatory Visit (INDEPENDENT_AMBULATORY_CARE_PROVIDER_SITE_OTHER): Admitting: Gastroenterology

## 2024-04-23 ENCOUNTER — Other Ambulatory Visit: Payer: Self-pay | Admitting: *Deleted

## 2024-04-23 ENCOUNTER — Encounter: Payer: Self-pay | Admitting: Gastroenterology

## 2024-04-23 VITALS — BP 122/83 | HR 99 | Temp 97.6°F | Ht 63.0 in | Wt 198.4 lb

## 2024-04-23 DIAGNOSIS — R748 Abnormal levels of other serum enzymes: Secondary | ICD-10-CM

## 2024-04-23 DIAGNOSIS — K649 Unspecified hemorrhoids: Secondary | ICD-10-CM | POA: Diagnosis not present

## 2024-04-23 DIAGNOSIS — K625 Hemorrhage of anus and rectum: Secondary | ICD-10-CM

## 2024-04-23 DIAGNOSIS — K219 Gastro-esophageal reflux disease without esophagitis: Secondary | ICD-10-CM | POA: Diagnosis not present

## 2024-04-23 DIAGNOSIS — K581 Irritable bowel syndrome with constipation: Secondary | ICD-10-CM | POA: Diagnosis not present

## 2024-04-23 DIAGNOSIS — R7989 Other specified abnormal findings of blood chemistry: Secondary | ICD-10-CM

## 2024-04-23 MED ORDER — TRULANCE 3 MG PO TABS: 1.0000 | ORAL_TABLET | Freq: Every day | ORAL | 7 refills | Status: DC

## 2024-04-23 NOTE — Patient Instructions (Addendum)
 We will mail lab slips in about 6 months to recheck some of you liver numbers.   Continue pantoprazole  40 mg once daily.   Follow a GERD diet:  Avoid fried, fatty, greasy, spicy, citrus foods. Avoid caffeine and carbonated beverages. Avoid chocolate. Try eating 4-6 small meals a day rather than 3 large meals. Do not eat within 3 hours of laying down. Prop head of bed up on wood or bricks to create a 6 inch incline.  For bloating you may use gas-x, phazyme, or beano for relief if severe.  Avoid gas-producing foods (eg, garlic, cabbage, legumes, onions, broccoli, brussel sprouts, wheat, and potatoes).   I have refilled your Trulance  for you!  For hemorrhoids you may use rectal hydrocortisone  and/or preparation H as needed.   Follow up in 1 year.   It was a pleasure to see you today. I want to create trusting relationships with patients. If you receive a survey regarding your visit,  I greatly appreciate you taking time to fill this out on paper or through your MyChart. I value your feedback.  Julian Obey, MSN, FNP-BC, AGACNP-BC [facility]

## 2024-05-03 ENCOUNTER — Telehealth (INDEPENDENT_AMBULATORY_CARE_PROVIDER_SITE_OTHER): Admitting: Psychiatry

## 2024-05-03 ENCOUNTER — Encounter (HOSPITAL_COMMUNITY): Payer: Self-pay | Admitting: Psychiatry

## 2024-05-03 DIAGNOSIS — F331 Major depressive disorder, recurrent, moderate: Secondary | ICD-10-CM

## 2024-05-03 DIAGNOSIS — F900 Attention-deficit hyperactivity disorder, predominantly inattentive type: Secondary | ICD-10-CM | POA: Diagnosis not present

## 2024-05-03 MED ORDER — ALPRAZOLAM 1 MG PO TABS: 1.0000 mg | ORAL_TABLET | Freq: Two times a day (BID) | ORAL | 2 refills | Status: DC | PRN

## 2024-05-03 MED ORDER — AMPHETAMINE-DEXTROAMPHETAMINE 20 MG PO TABS: 20.0000 mg | ORAL_TABLET | Freq: Two times a day (BID) | ORAL | 0 refills | Status: DC

## 2024-05-03 MED ORDER — FLUOXETINE HCL 40 MG PO CAPS: 40.0000 mg | ORAL_CAPSULE | Freq: Every day | ORAL | 2 refills | Status: DC

## 2024-05-03 MED ORDER — QUETIAPINE FUMARATE 100 MG PO TABS: 100.0000 mg | ORAL_TABLET | Freq: Two times a day (BID) | ORAL | 2 refills | Status: DC

## 2024-05-03 NOTE — Progress Notes (Signed)
 Virtual Visit via Video Note  I connected with Sandra Glover on 05/03/24 at 11:20 AM EDT by a video enabled telemedicine application and verified that I am speaking with the correct person using two identifiers.  Location: Patient: home Provider: office   I discussed the limitations of evaluation and management by telemedicine and the availability of in person appointments. The patient expressed understanding and agreed to proceed.      I discussed the assessment and treatment plan with the patient. The patient was provided an opportunity to ask questions and all were answered. The patient agreed with the plan and demonstrated an understanding of the instructions.   The patient was advised to call back or seek an in-person evaluation if the symptoms worsen or if the condition fails to improve as anticipated.  I provided 20 minutes of non-face-to-face time during this encounter.   Alfredia Annas, MD  [facility] MD/PA/NP OP Progress Note  05/03/2024 11:33 AM Sandra Glover  MRN:  [id number]  Chief Complaint:  Chief Complaint  Patient presents with   Depression   Anxiety   ADD   HPI: This patient is a 38 year old married white female who lives with her husband and 2 children in [place] . 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  For the most part she is doing well.  She denies significant depression or anxiety.  She is performing well at her job and is also taking classes for her bachelor's degree.  She denies significant mood swings irritability or trouble sleeping.  She denies any thoughts of suicide or self-harm.  Medically she has had elevated alkaline phosphatase so she is going to be having a liver ultrasound.  She does have frequent abdominal pain and cramping. Visit Diagnosis:    ICD-10-CM   1. Major depressive disorder, recurrent episode,  moderate (HCC)  F33.1     2. Attention deficit hyperactivity disorder (ADHD), predominantly inattentive type  F90.0       Past Psychiatric History: Long-term outpatient treatment  Past Medical History:  Past Medical History:  Diagnosis Date   Anxiety    Anxiety    Arthritis    Depression    History of anemia 2013   History of depression    states stopped med. 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 - due to kidney infection   Hypertension    Osteopenia    Pertussis    Postcoital bleeding 07/14/2015   Sinus infection 01/25/2013   started antibiotic 01/24/2013 x 10 days; nasal congestion, clear drainage from nose   Tonsillar and adenoid hypertrophy 01/2013   Vaginal irritation 03/25/2014   Mild yeast but has history of yeast, will not treat til after first trimester,try yogurt    Past Surgical History:  Procedure Laterality Date   CHOLECYSTECTOMY  04/23/2010   laparoscopic   COLONOSCOPY WITH PROPOFOL  N/A 05/17/2016   Procedure: COLONOSCOPY WITH PROPOFOL ;  Surgeon: Alyce Jubilee, MD;  [facility];  Service: Endoscopy;  Laterality: N/A;  1000   HEMORRHOID BANDING N/A 05/17/2016   Procedure: HEMORRHOID BANDING;  Surgeon: Alyce Jubilee, MD;  [facility];  Service: Endoscopy;  Laterality: N/A;   HEMORRHOID SURGERY N/A 06/08/2016   Procedure: SIMPLE HEMORRHOIDECTOMY;  Surgeon: Franki Isles, MD;  Location: [facility];  Service: General;  Laterality: N/A;  PARTIAL NEPHRECTOMY Right age 22 mos.   TONSILLECTOMY AND ADENOIDECTOMY N/A 01/29/2013   Procedure: TONSILLECTOMY AND ADENOIDECTOMY;  Surgeon: Lawence Press, MD;  Location: [facility];  Service: ENT;  Laterality: N/A;    Family Psychiatric History: See below  Family History:  Family History  Problem Relation Age of Onset   COPD Mother    Depression Mother    Anxiety disorder Mother    Hypertension Mother    Irritable bowel syndrome Mother    Arthritis Mother    Skin  cancer Mother    Thyroid  cancer Mother    Ovarian cancer Maternal Aunt    Ovarian cancer Maternal Grandmother    Cancer Maternal Grandmother        bladder, kidney   Hypertension Father    GER disease Father    Bipolar disorder Paternal Uncle    Anxiety disorder Cousin    Drug abuse Cousin    Bipolar disorder Cousin    Colon cancer Neg Hx     Social History:  Social History   Socioeconomic History   Marital status: Married    Spouse name: Not on file   Number of children: Not on file   Years of education: Not on file   Highest education level: Associate degree: academic program  Occupational History   Not on file  Tobacco Use   Smoking status: Never    Passive exposure: Never   Smokeless tobacco: Never   Tobacco comments:    Never smoked  Vaping Use   Vaping status: Never Used  Substance and Sexual Activity   Alcohol use: Yes    Alcohol/week: 0.0 standard drinks of alcohol    Comment: socially   Drug use: No   Sexual activity: Yes    Birth control/protection: Implant    Comment: nexplanon  implant  Other Topics Concern   Not on file  Social History Narrative   Not on file   Social Drivers of Health   Financial Resource Strain: Medium Risk (12/11/2023)   Overall Financial Resource Strain (CARDIA)    Difficulty of Paying Living Expenses: Somewhat hard  Food Insecurity: Food Insecurity Present (12/11/2023)   Hunger Vital Sign    Worried About Running Out of Food in the Last Year: Sometimes true    Ran Out of Food in the Last Year: Patient declined  Transportation Needs: No Transportation Needs (12/11/2023)   PRAPARE - Administrator, Civil Service (Medical): No    Lack of Transportation (Non-Medical): No  Physical Activity: Insufficiently Active (12/11/2023)   Exercise Vital Sign    Days of Exercise per Week: 5 days    Minutes of Exercise per Session: 20 min  Stress: Patient Declined (12/11/2023)   [employer] of Occupational Health - Occupational  Stress Questionnaire    Feeling of Stress : Patient declined  Social Connections: Unknown (12/11/2023)   Social Connection and Isolation Panel [NHANES]    Frequency of Communication with Friends and Family: More than three times a week    Frequency of Social Gatherings with Friends and Family: Once a week    Attends Religious Services: More than 4 times per year    Active Member of [employer] or Organizations: Patient declined    Attends Banker Meetings: Not on file    Marital Status: Married    Allergies:  Allergies  Allergen Reactions   Hydrocodone-Acetaminophen  Nausea Only   Lortab [Hydrocodone-Acetaminophen ] Nausea And Vomiting   Morphine And Codeine Rash  Metabolic Disorder Labs: Lab Results  Component Value Date   HGBA1C 5.3 12/11/2023   No results found for: "PROLACTIN" Lab Results  Component Value Date   CHOL 187 12/11/2023   TRIG 135 12/11/2023   HDL 60 12/11/2023   CHOLHDL 3.1 12/11/2023   LDLCALC 103 (H) 12/11/2023   LDLCALC 106 (H) 04/25/2023   Lab Results  Component Value Date   TSH 1.390 12/11/2023   TSH 1.170 04/25/2023    Therapeutic Level Labs: No results found for: "LITHIUM" No results found for: "VALPROATE" No results found for: "CBMZ"  Current Medications: Current Outpatient Medications  Medication Sig Dispense Refill   ALPRAZolam  (XANAX ) 1 MG tablet Take 1 tablet (1 mg total) by mouth 2 (two) times daily as needed for anxiety. 60 tablet 2   amphetamine -dextroamphetamine  (ADDERALL) 20 MG tablet Take 1 tablet (20 mg total) by mouth 2 (two) times daily. 60 tablet 0   amphetamine -dextroamphetamine  (ADDERALL) 20 MG tablet Take 1 tablet (20 mg total) by mouth 2 (two) times daily. 60 tablet 0   amphetamine -dextroamphetamine  (ADDERALL) 20 MG tablet Take 1 tablet (20 mg total) by mouth 2 (two) times daily. 60 tablet 0   benzonatate  (TESSALON ) 100 MG capsule Take 1 capsule (100 mg total) by mouth 3 (three) times daily as needed for cough. Do  not take with alcohol or while operating or driving heavy machinery (Patient not taking: Reported on 04/23/2024) 21 capsule 0   calcium-vitamin D  (OSCAL WITH D) 500-200 MG-UNIT tablet Take 1 tablet by mouth.     Drospirenone -Estetrol  3-14.2 MG TABS Take 1 tablet by mouth daily. (Patient not taking: Reported on 04/23/2024) 28 tablet 12   etonogestrel  (NEXPLANON ) 68 MG IMPL implant 1 each by Subdermal route once.     FLUoxetine  (PROZAC ) 40 MG capsule Take 1 capsule (40 mg total) by mouth daily. 30 capsule 2   gabapentin  (NEURONTIN ) 100 MG capsule Take 1 capsule (100 mg total) by mouth 3 (three) times daily. 90 capsule 2   ibuprofen  (ADVIL ) 600 MG tablet Take 1 tablet (600 mg total) by mouth every 6 (six) hours as needed. (Patient not taking: Reported on 04/23/2024) 30 tablet 0   lisinopril -hydrochlorothiazide  (ZESTORETIC ) 20-12.5 MG tablet Take 1 tablet by mouth daily. 90 tablet 1   meloxicam (MOBIC) 7.5 MG tablet Take 7.5 mg by mouth daily as needed for pain.     pantoprazole  (PROTONIX ) 40 MG tablet Take 1 tablet (40 mg total) by mouth 2 (two) times daily. 90 tablet 3   Plecanatide  (TRULANCE ) 3 MG TABS Take 1 tablet (3 mg total) by mouth daily. 30 tablet 7   polyethylene glycol powder (GLYCOLAX /MIRALAX ) 17 GM/SCOOP powder 1-4 scoop daily or as needed (Patient not taking: Reported on 04/23/2024) 255 g 11   promethazine -dextromethorphan (PROMETHAZINE -DM) 6.25-15 MG/5ML syrup Take 5 mLs by mouth at bedtime as needed for cough. Do not take with alcohol or while driving or operating heavy machinery.  May cause drowsiness. (Patient not taking: Reported on 04/23/2024) 118 mL 0   QUEtiapine  (SEROQUEL ) 100 MG tablet Take 1 tablet (100 mg total) by mouth 2 (two) times daily. 60 tablet 2   Vitamin D , Ergocalciferol , (DRISDOL ) 1.25 MG (50000 UNIT) CAPS capsule Take 1 capsule (50,000 Units total) by mouth every 7 (seven) days. 20 capsule 1   No current facility-administered medications for this visit.      Musculoskeletal: Strength & Muscle Tone: within normal limits Gait & Station: normal Patient leans: N/A  Psychiatric Specialty Exam: Review of Systems  Gastrointestinal:  Positive for abdominal pain.  All other systems reviewed and are negative.   Last menstrual period 03/28/2024.There is no height or weight on file to calculate BMI.  General Appearance: Casual and Fairly Groomed  Eye Contact:  Good  Speech:  Clear and Coherent  Volume:  Normal  Mood:  Euthymic  Affect:  Congruent  Thought Process:  Disorganized  Orientation:  Full (Time, Place, and Person)  Thought Content: WDL   Suicidal Thoughts:  No  Homicidal Thoughts:  No  Memory:  Immediate;   Good Recent;   Good Remote;   NA  Judgement:  Good  Insight:  Fair  Psychomotor Activity:  Normal  Concentration:  Concentration: Good and Attention Span: Good  Recall:  Good  Fund of Knowledge: Good  Language: Good  Akathisia:  No  Handed:  Right  AIMS (if indicated): not done  Assets:  Communication Skills Desire for Improvement Physical Health Resilience Social Support Talents/Skills  ADL's:  Intact  Cognition: WNL  Sleep:  Good   Screenings: GAD-7    Flowsheet Row Office Visit from 04/25/2023 in [facility] Counselor from 09/27/2018 in [facility] at [place]  Total GAD-7 Score 13 18      PHQ2-9    Flowsheet Row Office Visit from 04/25/2023 in [facility] Video Visit from 07/13/2022 in [facility] at [place] Video Visit from 03/23/2022 in [facility] at [place] Video Visit from 12/10/2021 in [facility] at [place] Video Visit from 11/12/2021 in [facility] at [facility] Total Score 3 0 0 0 2  PHQ-9 Total Score 12 -- -- -- 8      Flowsheet Row UC from 01/08/2024 in [facility] at [place]  UC from 10/23/2023 in [facility] at [place] UC from 09/28/2023 in [facility]gent [facility] at [facility] RISK CATEGORY No Risk No Risk No Risk        Assessment and Plan: This patient is a 38 year old female with a history depression anxiety ADD and back possibly bipolar 2 disorder.  She continues to do well on her current regimen.  She will continue Adderall 20 mg twice daily for ADD, Seroquel  100 mg twice daily for mood stabilization, Prozac  40 mg daily for depression and Xanax  1 mg twice daily as needed for anxiety.  She will return to see me in 3 months  Collaboration of Care: Collaboration of Care: Primary Care Provider AEB notes are shared with PCP on the epic system  Patient/Guardian was advised Release of Information must be obtained prior to any record release in order to collaborate their care with an outside provider. Patient/Guardian was advised if they have not already done so to contact the registration department to sign all necessary forms in order for us  to release information regarding their care.   Consent: Patient/Guardian gives verbal consent for treatment and assignment of benefits for services provided during this visit. Patient/Guardian expressed understanding and agreed to proceed.    Alfredia Annas, MD 05/03/2024, 11:33 AM

## 2024-05-06 ENCOUNTER — Encounter: Payer: Self-pay | Admitting: Family Medicine

## 2024-05-06 ENCOUNTER — Ambulatory Visit (INDEPENDENT_AMBULATORY_CARE_PROVIDER_SITE_OTHER): Admitting: Family Medicine

## 2024-05-06 VITALS — BP 114/77 | HR 87 | Temp 98.9°F | Resp 16 | Ht 63.0 in | Wt 197.0 lb

## 2024-05-06 DIAGNOSIS — B349 Viral infection, unspecified: Secondary | ICD-10-CM

## 2024-05-06 MED ORDER — PROMETHAZINE-DM 6.25-15 MG/5ML PO SYRP: 5.0000 mL | ORAL_SOLUTION | Freq: Four times a day (QID) | ORAL | 0 refills | Status: DC | PRN

## 2024-05-06 MED ORDER — PREDNISONE 20 MG PO TABS: 40.0000 mg | ORAL_TABLET | Freq: Every day | ORAL | 0 refills | Status: AC

## 2024-05-06 NOTE — Patient Instructions (Addendum)
 I appreciate the opportunity to provide care to you today!    Follow up:  5 months   Viral Illness:  I recommend symptomatic treatments with the following: Start taking prednisone  40 mg daily Start taking Promethazine  DM 5 mL by mouth every 4 hours as needed for cough and cold symptoms. Increase fluid intake and allow for plenty of rest. Take Tylenol  as needed for pain, fever, or general discomfort. Perform warm saltwater gargles 3-4 times daily to help with throat pain or discomfort. (Mix 1/2 teaspoon of salt in a glass of warm water and gargle several times daily to reduce throat inflammation and soothe irritation.) Ginger tea: Reduces throat irritation and can help with nausea. Look for sugar-free or honey-based throat lozenges to help with a sore throat. Use a humidifier at bedtime to help with cough and nasal congestion. For nasal congestion: The use of heated humidified air is a safe and effective therapy. Saline nasal sprays may also help alleviate nasal symptoms of the common cold. For cough: Treatment with honey may reduce cough frequency and severity.   Please follow up if your symptoms worsen or fail to improve.    Please continue to a heart-healthy diet and increase your physical activities. Try to exercise for at least five days a week.    It was a pleasure to see you and I look forward to continuing to work together on your health and well-being. Please do not hesitate to call the office if you need care or have questions about your care.  In case of emergency, please visit the Emergency Department for urgent care, or contact our clinic at [phone] to schedule an appointment. We're here to help you!   Have a wonderful day and week. With Gratitude, Skii Cleland MSN, FNP-BC

## 2024-05-06 NOTE — Progress Notes (Unsigned)
 Acute Office Visit  Subjective:    Patient ID: Sandra Glover, female    DOB: 01-27-1986, 38 y.o.   MRN: [id number]  Chief Complaint  Patient presents with   Cough    Cough and SOB, tightness in throat and congestion in chest. Started sat evening     HPI Patient is in today for the above complaints.  For the details of today's visit, please refer to the assessment and plan.     Past Medical History:  Diagnosis Date   Anxiety    Anxiety    Arthritis    Depression    History of anemia 2013   History of depression    states stopped med. in 2013   History of partial nephrectomy age 24 mos.   right - due to kidney infection   Hypertension    Osteopenia    Pertussis    Postcoital bleeding 07/14/2015   Sinus infection 01/25/2013   started antibiotic 01/24/2013 x 10 days; nasal congestion, clear drainage from nose   Tonsillar and adenoid hypertrophy 01/2013   Vaginal irritation 03/25/2014   Mild yeast but has history of yeast, will not treat til after first trimester,try yogurt    Past Surgical History:  Procedure Laterality Date   CHOLECYSTECTOMY  04/23/2010   laparoscopic   COLONOSCOPY WITH PROPOFOL  N/A 05/17/2016   Procedure: COLONOSCOPY WITH PROPOFOL ;  Surgeon: Alyce Jubilee, MD;  [facility];  Service: Endoscopy;  Laterality: N/A;  1000   HEMORRHOID BANDING N/A 05/17/2016   Procedure: HEMORRHOID BANDING;  Surgeon: Alyce Jubilee, MD;  [facility];  Service: Endoscopy;  Laterality: N/A;   HEMORRHOID SURGERY N/A 06/08/2016   Procedure: SIMPLE HEMORRHOIDECTOMY;  Surgeon: Franki Isles, MD;  Location: [facility];  Service: General;  Laterality: N/A;   PARTIAL NEPHRECTOMY Right age 43 mos.   TONSILLECTOMY AND ADENOIDECTOMY N/A 01/29/2013   Procedure: TONSILLECTOMY AND ADENOIDECTOMY;  Surgeon: Lawence Press, MD;  Location: [facility];  Service: ENT;  Laterality: N/A;    Family History  Problem Relation Age of Onset   COPD Mother     Depression Mother    Anxiety disorder Mother    Hypertension Mother    Irritable bowel syndrome Mother    Arthritis Mother    Skin cancer Mother    Thyroid  cancer Mother    Ovarian cancer Maternal Aunt    Ovarian cancer Maternal Grandmother    Cancer Maternal Grandmother        bladder, kidney   Hypertension Father    GER disease Father    Bipolar disorder Paternal Uncle    Anxiety disorder Cousin    Drug abuse Cousin    Bipolar disorder Cousin    Colon cancer Neg Hx     Social History   Socioeconomic History   Marital status: Married    Spouse name: Not on file   Number of children: Not on file   Years of education: Not on file   Highest education level: Associate degree: academic program  Occupational History   Not on file  Tobacco Use   Smoking status: Never    Passive exposure: Never   Smokeless tobacco: Never   Tobacco comments:    Never smoked  Vaping Use   Vaping status: Never Used  Substance and Sexual Activity   Alcohol use: Yes    Alcohol/week: 0.0 standard drinks of alcohol    Comment: socially   Drug use: No  Sexual activity: Yes    Birth control/protection: Implant    Comment: nexplanon  implant  Other Topics Concern   Not on file  Social History Narrative   Not on file   Social Drivers of Health   Financial Resource Strain: Medium Risk (12/11/2023)   Overall Financial Resource Strain (CARDIA)    Difficulty of Paying Living Expenses: Somewhat hard  Food Insecurity: Food Insecurity Present (12/11/2023)   Hunger Vital Sign    Worried About Running Out of Food in the Last Year: Sometimes true    Ran Out of Food in the Last Year: Patient declined  Transportation Needs: No Transportation Needs (12/11/2023)   PRAPARE - Administrator, Civil Service (Medical): No    Lack of Transportation (Non-Medical): No  Physical Activity: Insufficiently Active (12/11/2023)   Exercise Vital Sign    Days of Exercise per Week: 5 days    Minutes of Exercise  per Session: 20 min  Stress: Patient Declined (12/11/2023)   [employer] of Occupational Health - Occupational Stress Questionnaire    Feeling of Stress : Patient declined  Social Connections: Unknown (12/11/2023)   Social Connection and Isolation Panel [NHANES]    Frequency of Communication with Friends and Family: More than three times a week    Frequency of Social Gatherings with Friends and Family: Once a week    Attends Religious Services: More than 4 times per year    Active Member of [employer] or Organizations: Patient declined    Attends Engineer, structural: Not on file    Marital Status: Married  Catering manager Violence: Not on file    Outpatient Medications Prior to Visit  Medication Sig Dispense Refill   ALPRAZolam  (XANAX ) 1 MG tablet Take 1 tablet (1 mg total) by mouth 2 (two) times daily as needed for anxiety. 60 tablet 2   amphetamine -dextroamphetamine  (ADDERALL) 20 MG tablet Take 1 tablet (20 mg total) by mouth 2 (two) times daily. 60 tablet 0   amphetamine -dextroamphetamine  (ADDERALL) 20 MG tablet Take 1 tablet (20 mg total) by mouth 2 (two) times daily. 60 tablet 0   amphetamine -dextroamphetamine  (ADDERALL) 20 MG tablet Take 1 tablet (20 mg total) by mouth 2 (two) times daily. 60 tablet 0   calcium-vitamin D  (OSCAL WITH D) 500-200 MG-UNIT tablet Take 1 tablet by mouth.     Drospirenone -Estetrol  3-14.2 MG TABS Take 1 tablet by mouth daily. 28 tablet 12   etonogestrel  (NEXPLANON ) 68 MG IMPL implant 1 each by Subdermal route once.     FLUoxetine  (PROZAC ) 40 MG capsule Take 1 capsule (40 mg total) by mouth daily. 30 capsule 2   gabapentin  (NEURONTIN ) 100 MG capsule Take 1 capsule (100 mg total) by mouth 3 (three) times daily. 90 capsule 2   lisinopril -hydrochlorothiazide  (ZESTORETIC ) 20-12.5 MG tablet Take 1 tablet by mouth daily. 90 tablet 1   meloxicam (MOBIC) 7.5 MG tablet Take 7.5 mg by mouth daily as needed for pain.     pantoprazole  (PROTONIX ) 40 MG tablet  Take 1 tablet (40 mg total) by mouth 2 (two) times daily. 90 tablet 3   Plecanatide  (TRULANCE ) 3 MG TABS Take 1 tablet (3 mg total) by mouth daily. 30 tablet 7   polyethylene glycol powder (GLYCOLAX /MIRALAX ) 17 GM/SCOOP powder 1-4 scoop daily or as needed 255 g 11   QUEtiapine  (SEROQUEL ) 100 MG tablet Take 1 tablet (100 mg total) by mouth 2 (two) times daily. 60 tablet 2   Vitamin D , Ergocalciferol , (DRISDOL ) 1.25 MG (50000 UNIT)  CAPS capsule Take 1 capsule (50,000 Units total) by mouth every 7 (seven) days. 20 capsule 1   benzonatate  (TESSALON ) 100 MG capsule Take 1 capsule (100 mg total) by mouth 3 (three) times daily as needed for cough. Do not take with alcohol or while operating or driving heavy machinery (Patient not taking: Reported on 05/06/2024) 21 capsule 0   ibuprofen  (ADVIL ) 600 MG tablet Take 1 tablet (600 mg total) by mouth every 6 (six) hours as needed. (Patient not taking: Reported on 04/23/2024) 30 tablet 0   promethazine -dextromethorphan (PROMETHAZINE -DM) 6.25-15 MG/5ML syrup Take 5 mLs by mouth at bedtime as needed for cough. Do not take with alcohol or while driving or operating heavy machinery.  May cause drowsiness. (Patient not taking: Reported on 04/23/2024) 118 mL 0   No facility-administered medications prior to visit.    Allergies  Allergen Reactions   Hydrocodone-Acetaminophen  Nausea Only   Lortab [Hydrocodone-Acetaminophen ] Nausea And Vomiting   Morphine And Codeine Rash    Review of Systems  Constitutional:  Negative for chills and fever.  HENT:  Positive for congestion.   Eyes:  Negative for visual disturbance.  Respiratory:  Positive for cough and shortness of breath. Negative for chest tightness.   Neurological:  Negative for dizziness and headaches.       Objective:     Physical Exam HENT:     Head: Normocephalic.     Mouth/Throat:     Mouth: Mucous membranes are moist.  Cardiovascular:     Rate and Rhythm: Normal rate.     Heart sounds: Normal  heart sounds.  Pulmonary:     Effort: Pulmonary effort is normal.     Breath sounds: Normal breath sounds.  Neurological:     Mental Status: She is alert.     BP 114/77   Pulse 87   Temp 98.9 F (37.2 C) (Oral)   Resp 16   Ht 5\' 3"  (1.6 m)   Wt 197 lb (89.4 kg)   LMP 03/28/2024 (Approximate)   SpO2 95%   BMI 34.90 kg/m  Wt Readings from Last 3 Encounters:  05/06/24 197 lb (89.4 kg)  04/23/24 198 lb 6.4 oz (90 kg)  12/14/23 202 lb (91.6 kg)       Assessment & Plan:  Viral illness Assessment & Plan: I recommend symptomatic treatments with the following: Start taking prednisone  40 mg daily Start taking Promethazine  DM 5 mL by mouth every 4 hours as needed for cough and cold symptoms. Increase fluid intake and allow for plenty of rest. Take Tylenol  as needed for pain, fever, or general discomfort. Perform warm saltwater gargles 3-4 times daily to help with throat pain or discomfort. (Mix 1/2 teaspoon of salt in a glass of warm water and gargle several times daily to reduce throat inflammation and soothe irritation.) Ginger tea: Reduces throat irritation and can help with nausea. Look for sugar-free or honey-based throat lozenges to help with a sore throat. Use a humidifier at bedtime to help with cough and nasal congestion. For nasal congestion: The use of heated humidified air is a safe and effective therapy. Saline nasal sprays may also help alleviate nasal symptoms of the common cold. For cough: Treatment with honey may reduce cough frequency and severity.  Orders: -     COVID-19, Flu A+B and RSV -     predniSONE ; Take 2 tablets (40 mg total) by mouth daily for 5 days.  Dispense: 10 tablet; Refill: 0 -     Promethazine -DM; Take  5 mLs by mouth 4 (four) times daily as needed.  Dispense: 118 mL; Refill: 0   Note: This chart has been completed using Engineer, civil (consulting) software, and while attempts have been made to ensure accuracy, certain words and phrases may not be  transcribed as intended.   Portia Wisdom, FNP

## 2024-05-08 ENCOUNTER — Encounter: Payer: Self-pay | Admitting: Family Medicine

## 2024-05-08 LAB — COVID-19, FLU A+B AND RSV
Influenza A, NAA: NOT DETECTED
Influenza B, NAA: NOT DETECTED
RSV, NAA: NOT DETECTED
SARS-CoV-2, NAA: NOT DETECTED

## 2024-05-10 ENCOUNTER — Ambulatory Visit: Payer: Self-pay | Admitting: Family Medicine

## 2024-05-10 ENCOUNTER — Other Ambulatory Visit: Payer: Self-pay | Admitting: Family Medicine

## 2024-05-10 ENCOUNTER — Telehealth: Payer: Self-pay

## 2024-05-10 DIAGNOSIS — J0100 Acute maxillary sinusitis, unspecified: Secondary | ICD-10-CM

## 2024-05-10 DIAGNOSIS — B349 Viral infection, unspecified: Secondary | ICD-10-CM | POA: Insufficient documentation

## 2024-05-10 MED ORDER — AZITHROMYCIN 250 MG PO TABS: ORAL_TABLET | ORAL | 0 refills | Status: AC

## 2024-05-10 NOTE — Telephone Encounter (Signed)
 Please inform the patient that a prescription for Azithromycin  has been sent to her pharmacy. I advise against taking the antibiotic concurrently with Seroquel 

## 2024-05-10 NOTE — Telephone Encounter (Signed)
 Called pt to confirm if it was also a Drs note extension or just need medication, Pt stated "just needs medication called in as she returned to work on 05-09-24"

## 2024-05-10 NOTE — Assessment & Plan Note (Signed)
 I recommend symptomatic treatments with the following: Start taking prednisone  40 mg daily Start taking Promethazine  DM 5 mL by mouth every 4 hours as needed for cough and cold symptoms. Increase fluid intake and allow for plenty of rest. Take Tylenol  as needed for pain, fever, or general discomfort. Perform warm saltwater gargles 3-4 times daily to help with throat pain or discomfort. (Mix 1/2 teaspoon of salt in a glass of warm water and gargle several times daily to reduce throat inflammation and soothe irritation.) Ginger tea: Reduces throat irritation and can help with nausea. Look for sugar-free or honey-based throat lozenges to help with a sore throat. Use a humidifier at bedtime to help with cough and nasal congestion. For nasal congestion: The use of heated humidified air is a safe and effective therapy. Saline nasal sprays may also help alleviate nasal symptoms of the common cold. For cough: Treatment with honey may reduce cough frequency and severity.

## 2024-05-13 ENCOUNTER — Other Ambulatory Visit: Payer: Self-pay | Admitting: Family Medicine

## 2024-05-13 DIAGNOSIS — B379 Candidiasis, unspecified: Secondary | ICD-10-CM

## 2024-05-13 MED ORDER — FLUCONAZOLE 150 MG PO TABS: 150.0000 mg | ORAL_TABLET | Freq: Once | ORAL | 0 refills | Status: AC

## 2024-05-13 NOTE — Telephone Encounter (Signed)
 A prescription for diflucan  has been sent to her pharmacy and I recommend no taking concurrently with Seroquel 

## 2024-06-24 ENCOUNTER — Other Ambulatory Visit: Payer: Self-pay

## 2024-06-24 ENCOUNTER — Emergency Department (HOSPITAL_COMMUNITY)
Admission: EM | Admit: 2024-06-24 | Discharge: 2024-06-25 | Disposition: A | Discharge status: Home / Self Care | Attending: Emergency Medicine | Admitting: Emergency Medicine

## 2024-06-24 ENCOUNTER — Encounter (HOSPITAL_COMMUNITY): Payer: Self-pay

## 2024-06-24 ENCOUNTER — Emergency Department (HOSPITAL_COMMUNITY)

## 2024-06-24 DIAGNOSIS — R079 Chest pain, unspecified: Secondary | ICD-10-CM | POA: Insufficient documentation

## 2024-06-24 DIAGNOSIS — I10 Essential (primary) hypertension: Secondary | ICD-10-CM | POA: Insufficient documentation

## 2024-06-24 DIAGNOSIS — R110 Nausea: Secondary | ICD-10-CM | POA: Diagnosis not present

## 2024-06-24 LAB — BASIC METABOLIC PANEL WITH GFR
Anion gap: 11 (ref 5–15)
BUN: 5 mg/dL — ABNORMAL LOW (ref 6–20)
CO2: 23 mmol/L (ref 22–32)
Calcium: 9.4 mg/dL (ref 8.9–10.3)
Chloride: 103 mmol/L (ref 98–111)
Creatinine, Ser: 0.8 mg/dL (ref 0.44–1.00)
GFR, Estimated: >60 mL/min (ref >60)
Glucose, Bld: 104 mg/dL — ABNORMAL HIGH (ref 70–99)
Potassium: 4.1 mmol/L (ref 3.5–5.1)
Sodium: 137 mmol/L (ref 135–145)

## 2024-06-24 LAB — CBC
HCT: 39 % (ref 36.0–46.0)
Hemoglobin: 13.6 g/dL (ref 12.0–15.0)
MCH: 33.9 pg (ref 26.0–34.0)
MCHC: 34.9 g/dL (ref 30.0–36.0)
MCV: 97.3 fL (ref 80.0–100.0)
Platelets: 305 K/uL (ref 150–400)
RBC: 4.01 MIL/uL (ref 3.87–5.11)
RDW: 12.6 % (ref 11.5–15.5)
WBC: 12.3 K/uL — ABNORMAL HIGH (ref 4.0–10.5)
nRBC: 0 % (ref 0.0–0.2)

## 2024-06-24 LAB — D-DIMER, QUANTITATIVE: D-Dimer, Quant: <0.27 ug{FEU}/mL (ref 0.00–0.50)

## 2024-06-24 LAB — TROPONIN I (HIGH SENSITIVITY)
Troponin I (High Sensitivity): <2 ng/L (ref <18)
Troponin I (High Sensitivity): <2 ng/L (ref <18)

## 2024-06-24 NOTE — ED Provider Triage Note (Signed)
 Emergency Medicine Provider Triage Evaluation Note  Sandra Glover , a 38 y.o. female  was evaluated in triage.  Pt complains of chest pain and shortness of breath that have been ongoing for 1 week.  Feeling worsening fatigue exacerbated with any type of movement.  Reports walking her dog 2 days ago and began to vomit.  Prior history of myocardial edema, reports being on lisinopril  and a diuretic.  Review of Systems  Positive: Sob, cp Negative: Leg swelling  Physical Exam  BP (!) 173/99 (BP Location: Right Arm)   Pulse (!) 107   Temp 97.8 F (36.6 C) (Oral)   Resp (!) 24   SpO2 99%  Gen:   Awake, no distress   Resp:  Normal effort  MSK:   Moves extremities without difficulty  Other:    Medical Decision Making  Medically screening exam initiated at 6:49 PM.  Appropriate orders placed.  Sandra Glover was informed that the remainder of the evaluation will be completed by another provider, this initial triage assessment does not replace that evaluation, and the importance of remaining in the ED until their evaluation is complete.     Sandra Huether, PA-C 06/24/24 [phone]

## 2024-06-24 NOTE — ED Triage Notes (Signed)
 Pt to ED c/o CP x 1 week progressively getting worse.  Reports no alleviating or aggravating factors .Anxious in triage.

## 2024-06-24 NOTE — ED Provider Notes (Signed)
 [facility] Emergency Department Provider Note MRN:  [id number]  Arrival date & time: 06/25/24     Chief Complaint   Chest Pain   History of Present Illness   Sandra Glover is a 38 y.o. year-old female with no pertinent past medical history presenting to the ED with chief complaint of chest pain.  Chest pain intermittently over the past few weeks, seems to be becoming more frequent.  Described as a sharp squeezing pain in the left side of the chest.  Associated with nausea.  Sometimes random, sometimes happening with exertion.  Has noticed some intermittent swelling to the face, arms, legs recently.  Review of Systems  A thorough review of systems was obtained and all systems are negative except as noted in the HPI and PMH.   Patient's Health History    Past Medical History:  Diagnosis Date   Anxiety    Anxiety    Arthritis    Depression    History of anemia 2013   History of depression    states stopped med. 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   Hypertension    Osteopenia    Pertussis    Postcoital bleeding 07/14/2015   Sinus infection 01/25/2013   started antibiotic 01/24/2013 x 10 days; nasal congestion, clear drainage from nose   Tonsillar and adenoid hypertrophy 01/2013   Vaginal irritation 03/25/2014   Mild yeast but has history of yeast, will not treat til after first trimester,try yogurt    Past Surgical History:  Procedure Laterality Date   CHOLECYSTECTOMY  04/23/2010   laparoscopic   COLONOSCOPY WITH PROPOFOL  N/A 05/17/2016   Procedure: COLONOSCOPY WITH PROPOFOL ;  Surgeon: Margo LITTIE Haddock, MD;  [facility];  Service: Endoscopy;  Laterality: N/A;  1000   HEMORRHOID BANDING N/A 05/17/2016   Procedure: HEMORRHOID BANDING;  Surgeon: Margo LITTIE Haddock, MD;  [facility];  Service: Endoscopy;  Laterality: N/A;   HEMORRHOID SURGERY N/A 06/08/2016   Procedure: SIMPLE  HEMORRHOIDECTOMY;  Surgeon: Selinda Artist Moats, MD;  Location: [facility];  Service: General;  Laterality: N/A;   PARTIAL NEPHRECTOMY Right age 6 mos.   TONSILLECTOMY AND ADENOIDECTOMY N/A 01/29/2013   Procedure: TONSILLECTOMY AND ADENOIDECTOMY;  Surgeon: Ana LELON Moccasin, MD;  Location: [facility];  Service: ENT;  Laterality: N/A;    Family History  Problem Relation Age of Onset   COPD Mother    Depression Mother    Anxiety disorder Mother    Hypertension Mother    Irritable bowel syndrome Mother    Arthritis Mother    Skin cancer Mother    Thyroid  cancer Mother    Ovarian cancer Maternal Aunt    Ovarian cancer Maternal Grandmother    Cancer Maternal Grandmother        bladder, kidney   Hypertension Father    GER disease Father    Bipolar disorder Paternal Uncle    Anxiety disorder Cousin    Drug abuse Cousin    Bipolar disorder Cousin    Colon cancer Neg Hx     Social History   Socioeconomic History   Marital status: Married    Spouse name: Not on file   Number of children: Not on file   Years of education: Not on file   Highest education level: Associate degree: academic program  Occupational History   Not on file  Tobacco Use  Smoking status: Never    Passive exposure: Never   Smokeless tobacco: Never   Tobacco comments:    Never smoked  Vaping Use   Vaping status: Never Used  Substance and Sexual Activity   Alcohol use: Yes    Alcohol/week: 0.0 standard drinks of alcohol    Comment: socially   Drug use: No   Sexual activity: Yes    Birth control/protection: Implant    Comment: nexplanon  implant  Other Topics Concern   Not on file  Social History Narrative   Not on file   Social Drivers of Health   Financial Resource Strain: Medium Risk (12/11/2023)   Overall Financial Resource Strain (CARDIA)    Difficulty of Paying Living Expenses: Somewhat hard  Food Insecurity: Food Insecurity Present (12/11/2023)   Hunger Vital Sign    Worried About Running  Out of Food in the Last Year: Sometimes true    Ran Out of Food in the Last Year: Patient declined  Transportation Needs: No Transportation Needs (12/11/2023)   PRAPARE - Administrator, Civil Service (Medical): No    Lack of Transportation (Non-Medical): No  Physical Activity: Insufficiently Active (12/11/2023)   Exercise Vital Sign    Days of Exercise per Week: 5 days    Minutes of Exercise per Session: 20 min  Stress: Patient Declined (12/11/2023)   [employer] of Occupational Health - Occupational Stress Questionnaire    Feeling of Stress : Patient declined  Social Connections: Unknown (12/11/2023)   Social Connection and Isolation Panel    Frequency of Communication with Friends and Family: More than three times a week    Frequency of Social Gatherings with Friends and Family: Once a week    Attends Religious Services: More than 4 times per year    Active Member of Clubs or Organizations: Patient declined    Attends Banker Meetings: Not on file    Marital Status: Married  Intimate Partner Violence: Not on file     Physical Exam   Vitals:   06/25/24 0000 06/25/24 0115  BP: 107/64 107/65  Pulse:    Resp: 15 13  Temp:    SpO2:      CONSTITUTIONAL: Well-appearing, NAD NEURO/PSYCH:  Alert and oriented x 3, no focal deficits EYES:  eyes equal and reactive ENT/NECK:  no LAD, no JVD CARDIO: Tachycardic rate, well-perfused, normal S1 and S2 PULM:  CTAB no wheezing or rhonchi GI/GU:  non-distended, non-tender MSK/SPINE:  No gross deformities, no edema SKIN:  no rash, atraumatic   *Additional and/or pertinent findings included in MDM below  Diagnostic and Interventional Summary    EKG Interpretation Date/Time:  Monday June 24 2024 19:19:06 EDT Ventricular Rate:  103 PR Interval:  150 QRS Duration:  80 QT Interval:  334 QTC Calculation: 437 R Axis:   59  Text Interpretation: Sinus tachycardia Cannot rule out Anterior infarct , age undetermined  Abnormal ECG No previous ECGs available Confirmed by Theadore Sharper [id number]) on 06/24/2024 10:58:05 PM       Labs Reviewed  BASIC METABOLIC PANEL WITH GFR - Abnormal; Notable for the following components:      Result Value   Glucose, Bld 104 (*)    BUN 5 (*)    All other components within normal limits  CBC - Abnormal; Notable for the following components:   WBC 12.3 (*)    All other components within normal limits  BRAIN NATRIURETIC PEPTIDE  D-DIMER, QUANTITATIVE  TROPONIN I (HIGH SENSITIVITY)  TROPONIN I (HIGH SENSITIVITY)    DG Chest 1 View  Final Result      Medications - No data to display   Procedures  /  Critical Care Procedures  ED Course and Medical Decision Making  Initial Impression and Ddx Differential diagnosis includes stress, MSK, PE, ACS though less likely given lack of risk factors.  Past medical/surgical history that increases complexity of ED encounter: None  Interpretation of Diagnostics I personally reviewed the EKG and my interpretation is as follows: Sinus rhythm without concerning ischemic findings  No significant blood count or electrolyte disturbance.  Troponin negative, BNP and D-dimer normal.  Patient Reassessment and Ultimate Disposition/Management     Discharge  Patient management required discussion with the following services or consulting groups:  None  Complexity of Problems Addressed Acute illness or injury that poses threat of life of bodily function  Additional Data Reviewed and Analyzed Further history obtained from: None  Additional Factors Impacting ED Encounter Risk None  Ozell HERO. Theadore, MD [facility] Emergency Medicine [facility] [email or web address]  Final Clinical Impressions(s) / ED Diagnoses     ICD-10-CM   1. Chest pain, unspecified type  R07.9 Ambulatory referral to Cardiology      ED Discharge Orders          Ordered    Ambulatory referral to Cardiology        06/25/24 0117              Discharge Instructions Discussed with and Provided to Patient:     Discharge Instructions      You were evaluated in the Emergency Department and after careful evaluation, we did not find any emergent condition requiring admission or further testing in the hospital.  Your exam/testing today is overall reassuring.  Recommend follow-up with cardiology to discuss your symptoms.  Please return to the Emergency Department if you experience any worsening of your condition.   Thank you for allowing us  to be a part of your care.       Theadore Ozell HERO, MD 06/25/24 [phone]

## 2024-06-25 LAB — BRAIN NATRIURETIC PEPTIDE: B Natriuretic Peptide: 13.5 pg/mL (ref 0.0–100.0)

## 2024-06-25 NOTE — Discharge Instructions (Addendum)
You were evaluated in the Emergency Department and after careful evaluation, we did not find any emergent condition requiring admission or further testing in the hospital.  Your exam/testing today is overall reassuring.  Recommend follow-up with cardiology to discuss your symptoms.  Please return to the Emergency Department if you experience any worsening of your condition.   Thank you for allowing [place] to be a part of your care.

## 2024-06-26 ENCOUNTER — Other Ambulatory Visit: Payer: Self-pay | Admitting: Gastroenterology

## 2024-06-26 DIAGNOSIS — R748 Abnormal levels of other serum enzymes: Secondary | ICD-10-CM

## 2024-06-26 DIAGNOSIS — R7989 Other specified abnormal findings of blood chemistry: Secondary | ICD-10-CM

## 2024-08-07 ENCOUNTER — Other Ambulatory Visit: Payer: Self-pay | Admitting: Family Medicine

## 2024-08-07 DIAGNOSIS — E559 Vitamin D deficiency, unspecified: Secondary | ICD-10-CM

## 2024-08-13 ENCOUNTER — Telehealth (HOSPITAL_COMMUNITY): Payer: Self-pay

## 2024-08-13 ENCOUNTER — Other Ambulatory Visit (HOSPITAL_COMMUNITY): Payer: Self-pay | Admitting: Psychiatry

## 2024-08-13 MED ORDER — AMPHETAMINE-DEXTROAMPHETAMINE 20 MG PO TABS: 20.0000 mg | ORAL_TABLET | Freq: Two times a day (BID) | ORAL | 0 refills | Status: DC

## 2024-08-13 NOTE — Telephone Encounter (Signed)
 Pt aware.

## 2024-08-13 NOTE — Telephone Encounter (Signed)
 sent

## 2024-08-13 NOTE — Telephone Encounter (Signed)
 Pt called in requesting a refill on her Adderall 20 MG sent to [facility] on [address] in [place]. Pt scheduled 08/15/24. Please advise.

## 2024-08-15 ENCOUNTER — Telehealth (INDEPENDENT_AMBULATORY_CARE_PROVIDER_SITE_OTHER): Admitting: Psychiatry

## 2024-08-15 ENCOUNTER — Encounter (HOSPITAL_COMMUNITY): Payer: Self-pay | Admitting: Psychiatry

## 2024-08-15 DIAGNOSIS — F331 Major depressive disorder, recurrent, moderate: Secondary | ICD-10-CM | POA: Diagnosis not present

## 2024-08-15 DIAGNOSIS — F900 Attention-deficit hyperactivity disorder, predominantly inattentive type: Secondary | ICD-10-CM

## 2024-08-15 DIAGNOSIS — F4310 Post-traumatic stress disorder, unspecified: Secondary | ICD-10-CM

## 2024-08-15 MED ORDER — QUETIAPINE FUMARATE 100 MG PO TABS: 100.0000 mg | ORAL_TABLET | Freq: Two times a day (BID) | ORAL | 2 refills | Status: DC

## 2024-08-15 MED ORDER — AMPHETAMINE-DEXTROAMPHETAMINE 20 MG PO TABS: 20.0000 mg | ORAL_TABLET | Freq: Two times a day (BID) | ORAL | 0 refills | Status: DC

## 2024-08-15 MED ORDER — FLUOXETINE HCL 40 MG PO CAPS: 40.0000 mg | ORAL_CAPSULE | Freq: Every day | ORAL | 2 refills | Status: DC

## 2024-08-15 MED ORDER — ALPRAZOLAM 1 MG PO TABS: 1.0000 mg | ORAL_TABLET | Freq: Two times a day (BID) | ORAL | 2 refills | Status: DC | PRN

## 2024-08-15 NOTE — Progress Notes (Signed)
 Virtual Visit via Video Note  I connected with Sandra Glover on 08/15/24 at  3:00 PM EDT by a video enabled telemedicine application and verified that I am speaking with the correct person using two identifiers.  Location: Patient: home Provider: office   I discussed the limitations of evaluation and management by telemedicine and the availability of in person appointments. The patient expressed understanding and agreed to proceed.      I discussed the assessment and treatment plan with the patient. The patient was provided an opportunity to ask questions and all were answered. The patient agreed with the plan and demonstrated an understanding of the instructions.   The patient was advised to call back or seek an in-person evaluation if the symptoms worsen or if the condition fails to improve as anticipated.  I provided 20 minutes of non-face-to-face time during this encounter.   Barnie Gull, MD  [facility] MD/PA/NP OP Progress Note  08/15/2024 3:11 PM Sandra Glover  MRN:  [id number]  Chief Complaint:  Chief Complaint  Patient presents with   Depression   Anxiety   ADD   Manic Behavior   Follow-up   HPI:  This patient is a 38 year old married white female who lives with her husband and 2 children in [place] . 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  She claims that she is doing well.  She did go to the ER once over the summer for chest pain and is slated to see a cardiologist.  Other than that she has not had new health issues.  She is performing well at her job and enjoying time with her family.  She denies significant irritability depression trouble sleeping or mood swings.  She denies thoughts of suicide or self-harm Visit Diagnosis:    ICD-10-CM   1. Attention deficit hyperactivity disorder (ADHD), predominantly inattentive type  F90.0      2. PTSD (post-traumatic stress disorder)  F43.10     3. Major depressive disorder, recurrent episode, moderate (HCC)  F33.1       Past Psychiatric History: Long-term outpatient treatment  Past Medical History:  Past Medical History:  Diagnosis Date   Anxiety    Anxiety    Arthritis    Depression    History of anemia 2013   History of depression    states stopped med. 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   Hypertension    Osteopenia    Pertussis    Postcoital bleeding 07/14/2015   Sinus infection 01/25/2013   started antibiotic 01/24/2013 x 10 days; nasal congestion, clear drainage from Glover   Tonsillar and adenoid hypertrophy 01/2013   Vaginal irritation 03/25/2014   Mild yeast but has history of yeast, will not treat til after first trimester,try yogurt    Past Surgical History:  Procedure Laterality Date   CHOLECYSTECTOMY  04/23/2010   laparoscopic   COLONOSCOPY WITH PROPOFOL  N/A 05/17/2016   Procedure: COLONOSCOPY WITH PROPOFOL ;  Surgeon: Margo LITTIE Haddock, MD;  [facility];  Service: Endoscopy;  Laterality: N/A;  1000   HEMORRHOID BANDING N/A 05/17/2016   Procedure: HEMORRHOID BANDING;  Surgeon: Margo LITTIE Haddock, MD;  [facility];  Service: Endoscopy;  Laterality: N/A;   HEMORRHOID SURGERY N/A 06/08/2016   Procedure: SIMPLE HEMORRHOIDECTOMY;  Surgeon: Selinda  Artist Moats, MD;  Location: [facility];  Service: General;  Laterality: N/A;   PARTIAL NEPHRECTOMY Right age 74 mos.   TONSILLECTOMY AND ADENOIDECTOMY N/A 01/29/2013   Procedure: TONSILLECTOMY AND ADENOIDECTOMY;  Surgeon: Ana LELON Moccasin, MD;  Location: [facility];  Service: ENT;  Laterality: N/A;    Family Psychiatric History: See below  Family History:  Family History  Problem Relation Age of Onset   COPD Mother    Depression Mother    Anxiety disorder Mother    Hypertension Mother    Irritable bowel syndrome Mother    Arthritis Mother     Skin cancer Mother    Thyroid  cancer Mother    Ovarian cancer Maternal Aunt    Ovarian cancer Maternal Grandmother    Cancer Maternal Grandmother        bladder, kidney   Hypertension Father    GER disease Father    Bipolar disorder Paternal Uncle    Anxiety disorder Cousin    Drug abuse Cousin    Bipolar disorder Cousin    Colon cancer Neg Hx     Social History:  Social History   Socioeconomic History   Marital status: Married    Spouse name: Not on file   Number of children: Not on file   Years of education: Not on file   Highest education level: Associate degree: academic program  Occupational History   Not on file  Tobacco Use   Smoking status: Never    Passive exposure: Never   Smokeless tobacco: Never   Tobacco comments:    Never smoked  Vaping Use   Vaping status: Never Used  Substance and Sexual Activity   Alcohol use: Yes    Alcohol/week: 0.0 standard drinks of alcohol    Comment: socially   Drug use: No   Sexual activity: Yes    Birth control/protection: Implant    Comment: nexplanon  implant  Other Topics Concern   Not on file  Social History Narrative   Not on file   Social Drivers of Health   Financial Resource Strain: Medium Risk (12/11/2023)   Overall Financial Resource Strain (CARDIA)    Difficulty of Paying Living Expenses: Somewhat hard  Food Insecurity: Food Insecurity Present (12/11/2023)   Hunger Vital Sign    Worried About Running Out of Food in the Last Year: Sometimes true    Ran Out of Food in the Last Year: Patient declined  Transportation Needs: No Transportation Needs (12/11/2023)   PRAPARE - Administrator, Civil Service (Medical): No    Lack of Transportation (Non-Medical): No  Physical Activity: Insufficiently Active (12/11/2023)   Exercise Vital Sign    Days of Exercise per Week: 5 days    Minutes of Exercise per Session: 20 min  Stress: Patient Declined (12/11/2023)   [employer] of Occupational Health -  Occupational Stress Questionnaire    Feeling of Stress : Patient declined  Social Connections: Unknown (12/11/2023)   Social Connection and Isolation Panel    Frequency of Communication with Friends and Family: More than three times a week    Frequency of Social Gatherings with Friends and Family: Once a week    Attends Religious Services: More than 4 times per year    Active Member of [employer] or Organizations: Patient declined    Attends Banker Meetings: Not on file    Marital Status: Married    Allergies:  Allergies  Allergen Reactions   Hydrocodone-Acetaminophen  Nausea Only  Lortab [Hydrocodone-Acetaminophen ] Nausea And Vomiting   Morphine And Codeine Rash    Metabolic Disorder Labs: Lab Results  Component Value Date   HGBA1C 5.3 12/11/2023   No results found for: PROLACTIN Lab Results  Component Value Date   CHOL 187 12/11/2023   TRIG 135 12/11/2023   HDL 60 12/11/2023   CHOLHDL 3.1 12/11/2023   LDLCALC 103 (H) 12/11/2023   LDLCALC 106 (H) 04/25/2023   Lab Results  Component Value Date   TSH 1.390 12/11/2023   TSH 1.170 04/25/2023    Therapeutic Level Labs: No results found for: LITHIUM No results found for: VALPROATE No results found for: CBMZ  Current Medications: Current Outpatient Medications  Medication Sig Dispense Refill   ALPRAZolam  (XANAX ) 1 MG tablet Take 1 tablet (1 mg total) by mouth 2 (two) times daily as needed for anxiety. 60 tablet 2   amphetamine -dextroamphetamine  (ADDERALL) 20 MG tablet Take 1 tablet (20 mg total) by mouth 2 (two) times daily. 60 tablet 0   amphetamine -dextroamphetamine  (ADDERALL) 20 MG tablet Take 1 tablet (20 mg total) by mouth 2 (two) times daily. 60 tablet 0   amphetamine -dextroamphetamine  (ADDERALL) 20 MG tablet Take 1 tablet (20 mg total) by mouth 2 (two) times daily. 60 tablet 0   benzonatate  (TESSALON ) 100 MG capsule Take 1 capsule (100 mg total) by mouth 3 (three) times daily as needed for cough.  Do not take with alcohol or while operating or driving heavy machinery (Patient not taking: Reported on 05/06/2024) 21 capsule 0   calcium-vitamin D  (OSCAL WITH D) 500-200 MG-UNIT tablet Take 1 tablet by mouth.     Drospirenone -Estetrol  3-14.2 MG TABS Take 1 tablet by mouth daily. 28 tablet 12   etonogestrel  (NEXPLANON ) 68 MG IMPL implant 1 each by Subdermal route once.     FLUoxetine  (PROZAC ) 40 MG capsule Take 1 capsule (40 mg total) by mouth daily. 30 capsule 2   gabapentin  (NEURONTIN ) 100 MG capsule Take 1 capsule (100 mg total) by mouth 3 (three) times daily. 90 capsule 2   lisinopril -hydrochlorothiazide  (ZESTORETIC ) 20-12.5 MG tablet TAKE 1 TABLET BY MOUTH DAILY 90 tablet 1   meloxicam (MOBIC) 7.5 MG tablet Take 7.5 mg by mouth daily as needed for pain.     pantoprazole  (PROTONIX ) 40 MG tablet Take 1 tablet (40 mg total) by mouth 2 (two) times daily. 90 tablet 3   Plecanatide  (TRULANCE ) 3 MG TABS Take 1 tablet (3 mg total) by mouth daily. 30 tablet 7   polyethylene glycol powder (GLYCOLAX /MIRALAX ) 17 GM/SCOOP powder 1-4 scoop daily or as needed 255 g 11   promethazine -dextromethorphan (PROMETHAZINE -DM) 6.25-15 MG/5ML syrup Take 5 mLs by mouth 4 (four) times daily as needed. 118 mL 0   QUEtiapine  (SEROQUEL ) 100 MG tablet Take 1 tablet (100 mg total) by mouth 2 (two) times daily. 60 tablet 2   Vitamin D , Ergocalciferol , (DRISDOL ) 1.25 MG (50000 UNIT) CAPS capsule Take 1 capsule (50,000 Units total) by mouth every 7 (seven) days. 20 capsule 1   No current facility-administered medications for this visit.     Musculoskeletal: Strength & Muscle Tone: within normal limits Gait & Station: normal Patient leans: N/A  Psychiatric Specialty Exam: Review of Systems  All other systems reviewed and are negative.   There were no vitals taken for this visit.There is no height or weight on file to calculate BMI.  General Appearance: Casual and Fairly Groomed  Eye Contact:  Good  Speech:  Clear and  Coherent  Volume:  Normal  Mood:  Euthymic  Affect:  Congruent  Thought Process:  Goal Directed  Orientation:  Full (Time, Place, and Person)  Thought Content: WDL   Suicidal Thoughts:  No  Homicidal Thoughts:  No  Memory:  Immediate;   Good Recent;   Good Remote;   Good  Judgement:  Good  Insight:  Good  Psychomotor Activity:  Normal  Concentration:  Concentration: Good and Attention Span: Good  Recall:  Good  Fund of Knowledge: Good  Language: Good  Akathisia:  No  Handed:  Right  AIMS (if indicated): not done  Assets:  Communication Skills Desire for Improvement Physical Health Resilience Social Support Talents/Skills  ADL's:  Intact  Cognition: WNL  Sleep:  Good   Screenings: GAD-7    Flowsheet Row Office Visit from 05/06/2024 in [facility] Office Visit from 04/25/2023 in [facility] Primary Care Counselor from 09/27/2018 in [facility] Outpatient Behavior[facility] at [place]  Total GAD-7 Score 7 13 18    PHQ2-9    Flowsheet Row Office Visit from 05/06/2024 in [facility] Office Visit from 04/25/2023 in [facility] Primary Care Video Visit from 07/13/2022 in [facility] at [place] Video Visit from 03/23/2022 in [facility] Outpatient [facility] Health at [place] Video Visit from 12/10/2021 in [facility] Behavior[facility] at [facility] Total Score 2 3 0 0 0  PHQ-9 Total Score 9 12 -- -- --   Flowsheet Row ED from 06/24/2024 in [facility] Emergency Department at [facility] from 01/08/2024 in [facility] at [place] [facility] from 10/23/2023 in [facility] at [facility] RISK CATEGORY No Risk No Risk No Risk     Assessment and Plan: This patient is a 38 year old female with a history of depression anxiety ADD and possible bipolar 2 disorder.  She continues to do well on her current regimen.  She will continue Adderall  20 mg twice daily for ADD, Seroquel  100 mg twice daily for mood stabilization, Prozac  40 mg daily for depression and Xanax  1 mg twice daily as needed for anxiety.  She will return to see me in 3 months  Collaboration of Care: Collaboration of Care: Primary Care Provider AEB notes are shared with PCP on the epic system  Patient/Guardian was advised Release of Information must be obtained prior to any record release in order to collaborate their care with an outside provider. Patient/Guardian was advised if they have not already done so to contact the registration department to sign all necessary forms in order for us  to release information regarding their care.   Consent: Patient/Guardian gives verbal consent for treatment and assignment of benefits for services provided during this visit. Patient/Guardian expressed understanding and agreed to proceed.    Barnie Gull, MD 08/15/2024, 3:11 PM

## 2024-08-19 NOTE — Progress Notes (Signed)
  Cardiology Office Note:   Date:  08/19/2024  ID:  [name], DOB 10-05-1986, MRN [id number] PCP: Zarwolo, Gloria, FNP  [facility] Providers Cardiologist:  None { Chief Complaint: No chief complaint on file.     History of Present Illness:   Sandra Glover is a 38 y.o. female with a PMH of HTN, GERD, and mood disorders who presents as a new patient referred by Dr Theadore for the evaluation of chest pain.  She presented to the ED on 06/24/2024.  Her workup including troponins, EKG and CXR were all unremarkable.   Past Medical History:  Diagnosis Date   Anxiety    Anxiety    Arthritis    Depression    History of anemia 2013   History of depression    states stopped med. in 2013   History of partial nephrectomy age 48 mos.   right - due to kidney infection   Hypertension    Osteopenia    Pertussis    Postcoital bleeding 07/14/2015   Sinus infection 01/25/2013   started antibiotic 01/24/2013 x 10 days; nasal congestion, clear drainage from nose   Tonsillar and adenoid hypertrophy 01/2013   Vaginal irritation 03/25/2014   Mild yeast but has history of yeast, will not treat til after first trimester,try yogurt     Studies Reviewed:    EKG: ***           Risk Assessment/Calculations:   {Does this patient have ATRIAL FIBRILLATION?:[id number]} No BP recorded.  {Refresh Note OR Click here to enter BP  :1}***        Physical Exam:     VS:  There were no vitals taken for this visit. ***    Wt Readings from Last 3 Encounters:  06/24/24 180 lb (81.6 kg)  05/06/24 197 lb (89.4 kg)  04/23/24 198 lb 6.4 oz (90 kg)     GEN: Well nourished, well developed, in no acute distress NECK: No JVD; No carotid bruits CARDIAC: ***RRR, no murmurs, rubs, gallops RESPIRATORY:  Clear to auscultation without rales, wheezing or rhonchi  ABDOMEN: Soft, non-tender, non-distended, normal bowel sounds EXTREMITIES:  Warm and well perfused, no edema; No deformity, 2+  radial pulses PSYCH: Normal mood and affect   Assessment & Plan       {Are you ordering a CV Procedure (e.g. stress test, cath, DCCV, TEE, etc)?   Press F2        :[id number]}   This note was written with the assistance of a dictation microphone or AI dictation software. Please excuse any typos or grammatical errors.   Signed, Georganna Archer, MD 08/19/2024 4:17 PM    [facility]

## 2024-08-20 ENCOUNTER — Other Ambulatory Visit (HOSPITAL_COMMUNITY): Payer: Self-pay

## 2024-08-20 ENCOUNTER — Encounter: Payer: Self-pay | Admitting: Student in an Organized Health Care Education/Training Program

## 2024-08-20 ENCOUNTER — Ambulatory Visit
Attending: Student in an Organized Health Care Education/Training Program | Admitting: Student in an Organized Health Care Education/Training Program

## 2024-08-20 VITALS — BP 110/62 | HR 93 | Ht 63.0 in | Wt 170.0 lb

## 2024-08-20 DIAGNOSIS — R0609 Other forms of dyspnea: Secondary | ICD-10-CM | POA: Insufficient documentation

## 2024-08-20 DIAGNOSIS — R079 Chest pain, unspecified: Secondary | ICD-10-CM | POA: Insufficient documentation

## 2024-08-20 MED ORDER — METOPROLOL TARTRATE 100 MG PO TABS
100.0000 mg | ORAL_TABLET | Freq: Once | ORAL | 0 refills | Status: DC
  Filled 2024-08-20: qty 1, 1d supply, fill #0

## 2024-08-20 NOTE — Patient Instructions (Addendum)
 Medication Instructions:  ON DAY OF CORONARY CT SCAN: Metoprolol  Tartrate 100 mg  Take 1 tablet (100 mg total) by mouth once for 1 dose. Take 90-120 minutes prior to scan. Hold for SBP less than 110    *If you need a refill on your cardiac medications before your next appointment, please call your pharmacy*  Lab Work: HCG BMP  If you have labs (blood work) drawn today and your tests are completely normal, you will receive your results only by: MyChart Message (if you have MyChart) OR A paper copy in the mail If you have any lab test that is abnormal or we need to change your treatment, we will call you to review the results.  Testing/Procedures: Coronary CTA  Your physician has requested that you have cardiac CT. Cardiac computed tomography (CT) is a painless test that uses an x-ray machine to take clear, detailed pictures of your heart. For further information please visit [email or web address] Please follow instruction sheet as given.    Your cardiac CT will be scheduled at one of the below locations:    [facility] [address]  [place], [place] [address] [phone]  If scheduled at the Heart and Vascular Tower at [facility] street, please enter the parking lot using the [address] entrance and use the FREE valet service at the patient drop-off area. Enter the building and check-in with registration on the main floor.  Please follow these instructions carefully (unless otherwise directed):  An IV will be required for this test and Nitroglycerin will be given.  Hold all erectile dysfunction medications at least 3 days (72 hrs) prior to test. (Ie viagra, cialis, sildenafil, tadalafil, etc)   On the Night Before the Test: Be sure to Drink plenty of water. Do not consume any caffeinated/decaffeinated beverages or chocolate 12 hours prior to your test. Do not take any antihistamines 12 hours prior to your test.  On the Day of the  Test: Drink plenty of water until 1 hour prior to the test. Do not eat any food 1 hour prior to test. You may take your regular medications prior to the test.  Take metoprolol  (Lopressor ) two hours prior to test. If you take Furosemide/Hydrochlorothiazide /Spironolactone/Chlorthalidone, please HOLD on the morning of the test. Patients who wear a continuous glucose monitor MUST remove the device prior to scanning. FEMALES- please wear underwire-free bra if available, avoid dresses & tight clothing       After the Test: Drink plenty of water. After receiving IV contrast, you may experience a mild flushed feeling. This is normal. On occasion, you may experience a mild rash up to 24 hours after the test. This is not dangerous. If this occurs, you can take Benadryl  25 mg, Zyrtec, Claritin , or Allegra and increase your fluid intake. (Patients taking Tikosyn should avoid Benadryl , and may take Zyrtec, Claritin , or Allegra) If you experience trouble breathing, this can be serious. If it is severe call 911 IMMEDIATELY. If it is mild, please call our office.  We will call to schedule your test 2-4 weeks out understanding that some insurance companies will need an authorization prior to the service being performed.   For more information and frequently asked questions, please visit our website : [email or web address]  For non-scheduling related questions, please contact the cardiac imaging nurse navigator should you have any questions/concerns: Cardiac Imaging Nurse Navigators Direct Office Dial: [phone]   For scheduling needs, including cancellations and rescheduling, please call [place], [phone].  Echocardiogram  Your physician has requested that you have an echocardiogram. Echocardiography is a painless test that uses sound waves to create images of your heart. It provides your doctor with information about the size and shape of your heart and how well your heart's  chambers and valves are working. This procedure takes approximately one hour. There are no restrictions for this procedure. Please do NOT wear cologne, perfume, aftershave, or lotions (deodorant is allowed). Please arrive 15 minutes prior to your appointment time.  Please note: We ask at that you not bring children with you during ultrasound (echo/ vascular) testing. Due to room size and safety concerns, children are not allowed in the ultrasound rooms during exams. Our front office staff cannot provide observation of children in our lobby area while testing is being conducted. An adult accompanying a patient to their appointment will only be allowed in the ultrasound room at the discretion of the ultrasound technician under special circumstances. We apologize for any inconvenience.  Follow-Up: At [facility], you and your health needs are our priority.  As part of our continuing mission to provide you with exceptional heart care, our providers are all part of one team.  This team includes your primary Cardiologist (physician) and Advanced Practice Providers or APPs (Physician Assistants and Nurse Practitioners) who all work together to provide you with the care you need, when you need it.  Your next appointment:   AS NEEDED  Provider:   Georganna Archer, MD

## 2024-08-21 ENCOUNTER — Ambulatory Visit: Payer: Self-pay | Admitting: Student in an Organized Health Care Education/Training Program

## 2024-08-21 LAB — BASIC METABOLIC PANEL WITH GFR
BUN/Creatinine Ratio: 9 (ref 9–23)
BUN: 7 mg/dL (ref 6–20)
CO2: 20 mmol/L (ref 20–29)
Calcium: 9.1 mg/dL (ref 8.7–10.2)
Chloride: 105 mmol/L (ref 96–106)
Creatinine, Ser: 0.81 mg/dL (ref 0.57–1.00)
Glucose: 93 mg/dL (ref 70–99)
Potassium: 4.1 mmol/L (ref 3.5–5.2)
Sodium: 141 mmol/L (ref 134–144)
eGFR: 97 mL/min/1.73 (ref >59)

## 2024-08-21 LAB — BETA HCG QUANT (REF LAB): hCG Quant: <1 m[IU]/mL

## 2024-09-13 ENCOUNTER — Encounter (HOSPITAL_COMMUNITY): Payer: Self-pay

## 2024-09-16 ENCOUNTER — Ambulatory Visit (HOSPITAL_COMMUNITY)
Admission: RE | Admit: 2024-09-16 | Discharge: 2024-09-16 | Disposition: A | Discharge status: Home / Self Care | Source: Ambulatory Visit | Attending: Student in an Organized Health Care Education/Training Program | Admitting: Student in an Organized Health Care Education/Training Program

## 2024-09-16 DIAGNOSIS — R079 Chest pain, unspecified: Secondary | ICD-10-CM | POA: Insufficient documentation

## 2024-09-16 DIAGNOSIS — R0609 Other forms of dyspnea: Secondary | ICD-10-CM | POA: Diagnosis not present

## 2024-09-16 MED ORDER — NITROGLYCERIN 0.4 MG SL SUBL
0.8000 mg | SUBLINGUAL_TABLET | Freq: Once | SUBLINGUAL | Status: AC
  Administered 2024-09-16: 0.8 mg via SUBLINGUAL

## 2024-09-16 MED ORDER — IOHEXOL 350 MG/ML SOLN
100.0000 mL | Freq: Once | INTRAVENOUS | Status: AC | PRN
  Administered 2024-09-16: 100 mL via INTRAVENOUS

## 2024-09-18 ENCOUNTER — Telehealth: Payer: Self-pay | Admitting: *Deleted

## 2024-09-18 NOTE — Telephone Encounter (Signed)
 Pt's insurance will no longer pay for Trulance . She needs to try something else.

## 2024-09-20 ENCOUNTER — Other Ambulatory Visit: Payer: Self-pay | Admitting: Gastroenterology

## 2024-09-20 DIAGNOSIS — K581 Irritable bowel syndrome with constipation: Secondary | ICD-10-CM

## 2024-09-20 MED ORDER — LUBIPROSTONE 8 MCG PO CAPS
8.0000 ug | ORAL_CAPSULE | Freq: Two times a day (BID) | ORAL | 1 refills | Status: DC
Start: 2024-09-20 — End: 2024-09-23

## 2024-09-21 ENCOUNTER — Other Ambulatory Visit: Payer: Self-pay | Admitting: Gastroenterology

## 2024-09-21 DIAGNOSIS — K581 Irritable bowel syndrome with constipation: Secondary | ICD-10-CM

## 2024-09-26 ENCOUNTER — Other Ambulatory Visit (HOSPITAL_COMMUNITY)

## 2024-10-02 ENCOUNTER — Ambulatory Visit (HOSPITAL_COMMUNITY)
Admission: RE | Admit: 2024-10-02 | Discharge: 2024-10-02 | Disposition: A | Discharge status: Home / Self Care | Source: Ambulatory Visit | Attending: Cardiology | Admitting: Cardiology

## 2024-10-02 DIAGNOSIS — R079 Chest pain, unspecified: Secondary | ICD-10-CM | POA: Insufficient documentation

## 2024-10-02 DIAGNOSIS — R0609 Other forms of dyspnea: Secondary | ICD-10-CM | POA: Insufficient documentation

## 2024-10-02 LAB — ECHOCARDIOGRAM COMPLETE
Area-P 1/2: 4.29 cm2
S' Lateral: 2.7 cm

## 2024-10-02 MED ORDER — PERFLUTREN LIPID MICROSPHERE
1.0000 mL | INTRAVENOUS | Status: AC | PRN
  Administered 2024-10-02: 2 mL via INTRAVENOUS

## 2024-10-10 ENCOUNTER — Ambulatory Visit: Admitting: Family Medicine

## 2024-10-22 ENCOUNTER — Other Ambulatory Visit: Payer: Self-pay | Admitting: *Deleted

## 2024-10-22 DIAGNOSIS — R7989 Other specified abnormal findings of blood chemistry: Secondary | ICD-10-CM

## 2024-10-22 DIAGNOSIS — R748 Abnormal levels of other serum enzymes: Secondary | ICD-10-CM

## 2024-11-06 NOTE — Telephone Encounter (Signed)
 Pt sent a MyChart message

## 2024-11-13 ENCOUNTER — Other Ambulatory Visit: Payer: Self-pay | Admitting: Gastroenterology

## 2024-11-13 MED ORDER — LINACLOTIDE 290 MCG PO CAPS: 290.0000 ug | ORAL_CAPSULE | Freq: Every day | ORAL | 5 refills | Status: AC

## 2024-11-14 ENCOUNTER — Telehealth: Payer: Self-pay | Admitting: Gastroenterology

## 2024-11-14 NOTE — Telephone Encounter (Signed)
 Left message for patient to call back and let us  know which appt time and date she would like to have.  Sandra Glover had asked that we get her scehduled.... 12/23 at 11  or  12/24 at 10

## 2024-11-14 NOTE — Telephone Encounter (Signed)
 I left the patient a message to see which appt she would like... 12/23 at 11 or 12/24 at 10 per CM

## 2024-11-22 ENCOUNTER — Emergency Department (HOSPITAL_COMMUNITY)
Admission: EM | Admit: 2024-11-22 | Discharge: 2024-11-22 | Disposition: A | Discharge status: Home / Self Care | Attending: Emergency Medicine | Admitting: Emergency Medicine

## 2024-11-22 ENCOUNTER — Emergency Department (HOSPITAL_COMMUNITY)

## 2024-11-22 DIAGNOSIS — K649 Unspecified hemorrhoids: Secondary | ICD-10-CM | POA: Insufficient documentation

## 2024-11-22 DIAGNOSIS — R079 Chest pain, unspecified: Secondary | ICD-10-CM | POA: Insufficient documentation

## 2024-11-22 DIAGNOSIS — D72829 Elevated white blood cell count, unspecified: Secondary | ICD-10-CM | POA: Insufficient documentation

## 2024-11-22 DIAGNOSIS — I10 Essential (primary) hypertension: Secondary | ICD-10-CM | POA: Insufficient documentation

## 2024-11-22 DIAGNOSIS — R109 Unspecified abdominal pain: Secondary | ICD-10-CM | POA: Diagnosis present

## 2024-11-22 DIAGNOSIS — R1084 Generalized abdominal pain: Secondary | ICD-10-CM | POA: Insufficient documentation

## 2024-11-22 DIAGNOSIS — Z79899 Other long term (current) drug therapy: Secondary | ICD-10-CM | POA: Diagnosis not present

## 2024-11-22 DIAGNOSIS — R112 Nausea with vomiting, unspecified: Secondary | ICD-10-CM | POA: Insufficient documentation

## 2024-11-22 LAB — HCG, SERUM, QUALITATIVE: Preg, Serum: NEGATIVE

## 2024-11-22 LAB — CBC WITH DIFFERENTIAL/PLATELET
Abs Immature Granulocytes: 0.04 K/uL (ref 0.00–0.07)
Basophils Absolute: 0.1 K/uL (ref 0.0–0.1)
Basophils Relative: 0 %
Eosinophils Absolute: 0.2 K/uL (ref 0.0–0.5)
Eosinophils Relative: 1 %
HCT: 39.6 % (ref 36.0–46.0)
Hemoglobin: 13.9 g/dL (ref 12.0–15.0)
Immature Granulocytes: 0 %
Lymphocytes Relative: 21 %
Lymphs Abs: 2.9 K/uL (ref 0.7–4.0)
MCH: 34.6 pg — ABNORMAL HIGH (ref 26.0–34.0)
MCHC: 35.1 g/dL (ref 30.0–36.0)
MCV: 98.5 fL (ref 80.0–100.0)
Monocytes Absolute: 1 K/uL (ref 0.1–1.0)
Monocytes Relative: 7 %
Neutro Abs: 9.6 K/uL — ABNORMAL HIGH (ref 1.7–7.7)
Neutrophils Relative %: 71 %
Platelets: 307 K/uL (ref 150–400)
RBC: 4.02 MIL/uL (ref 3.87–5.11)
RDW: 12.3 % (ref 11.5–15.5)
WBC: 13.7 K/uL — ABNORMAL HIGH (ref 4.0–10.5)
nRBC: 0 % (ref 0.0–0.2)

## 2024-11-22 LAB — URINALYSIS, ROUTINE W REFLEX MICROSCOPIC
Bilirubin Urine: NEGATIVE
Glucose, UA: NEGATIVE mg/dL
Hgb urine dipstick: NEGATIVE
Ketones, ur: NEGATIVE mg/dL
Leukocytes,Ua: NEGATIVE
Nitrite: NEGATIVE
Protein, ur: NEGATIVE mg/dL
Specific Gravity, Urine: >1.046 — ABNORMAL HIGH (ref 1.005–1.030)
pH: 6 (ref 5.0–8.0)

## 2024-11-22 LAB — COMPREHENSIVE METABOLIC PANEL WITH GFR
ALT: 85 U/L — ABNORMAL HIGH (ref 0–44)
AST: 191 U/L — ABNORMAL HIGH (ref 15–41)
Albumin: 4.4 g/dL (ref 3.5–5.0)
Alkaline Phosphatase: 144 U/L — ABNORMAL HIGH (ref 38–126)
Anion gap: 17 — ABNORMAL HIGH (ref 5–15)
BUN: 12 mg/dL (ref 6–20)
CO2: 19 mmol/L — ABNORMAL LOW (ref 22–32)
Calcium: 9.7 mg/dL (ref 8.9–10.3)
Chloride: 100 mmol/L (ref 98–111)
Creatinine, Ser: 0.81 mg/dL (ref 0.44–1.00)
GFR, Estimated: >60 mL/min
Glucose, Bld: 102 mg/dL — ABNORMAL HIGH (ref 70–99)
Potassium: 4.3 mmol/L (ref 3.5–5.1)
Sodium: 136 mmol/L (ref 135–145)
Total Bilirubin: 0.2 mg/dL (ref 0.0–1.2)
Total Protein: 6.9 g/dL (ref 6.5–8.1)

## 2024-11-22 LAB — LIPASE, BLOOD: Lipase: 42 U/L (ref 11–51)

## 2024-11-22 LAB — TROPONIN T, HIGH SENSITIVITY: Troponin T High Sensitivity: <15 ng/L (ref 0–19)

## 2024-11-22 MED ORDER — IOHEXOL 300 MG/ML  SOLN
100.0000 mL | Freq: Once | INTRAMUSCULAR | Status: AC | PRN
  Administered 2024-11-22: 100 mL via INTRAVENOUS

## 2024-11-22 MED ORDER — FAMOTIDINE IN NACL 20-0.9 MG/50ML-% IV SOLN
20.0000 mg | Freq: Once | INTRAVENOUS | Status: AC
  Administered 2024-11-22: 20 mg via INTRAVENOUS
  Filled 2024-11-22: qty 50

## 2024-11-22 MED ORDER — OXYCODONE HCL 5 MG PO TABS: 5.0000 mg | ORAL_TABLET | Freq: Four times a day (QID) | ORAL | 0 refills | Status: DC | PRN

## 2024-11-22 MED ORDER — ONDANSETRON HCL 4 MG/2ML IJ SOLN
4.0000 mg | Freq: Once | INTRAMUSCULAR | Status: AC
  Administered 2024-11-22: 4 mg via INTRAVENOUS
  Filled 2024-11-22: qty 2

## 2024-11-22 MED ORDER — ONDANSETRON HCL 4 MG PO TABS: 4.0000 mg | ORAL_TABLET | Freq: Four times a day (QID) | ORAL | 0 refills | Status: DC

## 2024-11-22 MED ORDER — HYDROMORPHONE HCL 1 MG/ML IJ SOLN
1.0000 mg | Freq: Once | INTRAMUSCULAR | Status: AC
  Administered 2024-11-22: 1 mg via INTRAVENOUS
  Filled 2024-11-22: qty 1

## 2024-11-22 MED ORDER — LACTATED RINGERS IV BOLUS
1000.0000 mL | Freq: Once | INTRAVENOUS | Status: AC
  Administered 2024-11-22: 1000 mL via INTRAVENOUS

## 2024-11-22 NOTE — ED Provider Notes (Addendum)
 " [place] EMERGENCY DEPARTMENT AT [facility] Provider Note   CSN: [id number] Arrival date & time: 11/22/24  1555     Patient presents with: Chest Pain   Sandra Glover is a 38 y.o. female.  Patient is a 38 year old female with a history of chronic abdominal pain, IBS, hypertension, hyperlipidemia, bipolar 1 disorder who presents to the ED for increasing abdominal pain that worsened this morning.  Patient notes she has had increasing phantom gallbladder attacks for several days that got much worse today.  She notes she would normally vomit after these attacks and she will feel better.  She states she had 1 this morning that she vomited after and was doing okay.  She notes she had another episode approximately 2 hours ago that did not alleviate with the vomiting.  She states pain has been constant and is in the upper abdomen.  Notes she is in the process of following up with GI and has an appointment scheduled for next Wednesday.  She was recently represcribed Linzess  but has not started taking it yet.  She notes she does have this to start taking tomorrow.  States her last abdominal surgery was hemorrhoid surgery in 2017.  She does have a history of cholecystectomy in 2011.  She notes she has had some increasing bright red blood per rectum and did have blood while straining to have a bowel movement today.  Notes bowel movement was yesterday.  She states she started having issues again with her hemorrhoids in the past few months.  Notes they are on the inside.  States her menstrual cycles are regular and she has a Nexplanon  in place.  Notes she was also having chest pain earlier today with the abdominal pain.  States she still has mild chest pain that is worse with exertion.  She has been seen by cardiology and is still currently following up.  Denies fevers, chills, headache, shortness of breath, diarrhea, hematemesis, urinary symptoms, blood thinner use.  No further  complaints.    Chest Pain Associated symptoms: abdominal pain, nausea and vomiting   Associated symptoms: no fever, no headache and no shortness of breath        Prior to Admission medications  Medication Sig Start Date End Date Taking? Authorizing Provider  ondansetron  (ZOFRAN ) 4 MG tablet Take 1 tablet (4 mg total) by mouth every 6 (six) hours. 11/22/24  Yes Levoy Geisen, Thersia RAMAN, PA-C  oxyCODONE  (ROXICODONE ) 5 MG immediate release tablet Take 1 tablet (5 mg total) by mouth every 6 (six) hours as needed for up to 10 doses for severe pain (pain score 7-10). 11/22/24  Yes Iesha Summerhill, Thersia RAMAN, PA-C  ALPRAZolam  (XANAX ) 1 MG tablet Take 1 tablet (1 mg total) by mouth 2 (two) times daily as needed for anxiety. 08/15/24 08/15/25  Okey Barnie SAUNDERS, MD  amphetamine -dextroamphetamine  (ADDERALL) 20 MG tablet Take 1 tablet (20 mg total) by mouth 2 (two) times daily. 08/15/24 08/15/25  Okey Barnie SAUNDERS, MD  benzonatate  (TESSALON ) 100 MG capsule Take 1 capsule (100 mg total) by mouth 3 (three) times daily as needed for cough. Do not take with alcohol or while operating or driving heavy machinery 06/09/73   Chandra Harlene LABOR, NP  calcium-vitamin D  (OSCAL WITH D) 500-200 MG-UNIT tablet Take 1 tablet by mouth.    [provider]  Drospirenone -Estetrol  3-14.2 MG TABS Take 1 tablet by mouth daily. 12/22/21   Jayne Vonn VEAR, MD  etonogestrel  (NEXPLANON ) 68 MG IMPL implant 1 each by  Subdermal route once.    [provider]  FLUoxetine  (PROZAC ) 40 MG capsule Take 1 capsule (40 mg total) by mouth daily. 08/15/24   Okey Barnie SAUNDERS, MD  gabapentin  (NEURONTIN ) 100 MG capsule Take 1 capsule (100 mg total) by mouth 3 (three) times daily. 07/25/23   Margrette Taft BRAVO, MD  linaclotide  (LINZESS ) 290 MCG CAPS capsule Take 1 capsule (290 mcg total) by mouth daily before breakfast. 11/13/24   Kennedy Charmaine CROME, NP  lisinopril -hydrochlorothiazide  (ZESTORETIC ) 20-12.5 MG tablet TAKE 1 TABLET BY MOUTH DAILY 08/08/24   Bacchus,  Gloria Z, FNP  lubiprostone  (AMITIZA ) 8 MCG capsule TAKE 1 CAPSULE(8 MCG) BY MOUTH TWICE DAILY WITH A MEAL 09/23/24   Kennedy Charmaine CROME, NP  meloxicam (MOBIC) 7.5 MG tablet Take 7.5 mg by mouth daily as needed for pain. 08/15/19   [provider]  metoprolol  tartrate (LOPRESSOR ) 100 MG tablet Take 1 tablet (100 mg total) by mouth once for 1 dose. Take 90-120 minutes prior to scan. Hold for SBP less than 110. 08/20/24 08/21/24  Floretta Mallard, MD  pantoprazole  (PROTONIX ) 40 MG tablet Take 1 tablet (40 mg total) by mouth 2 (two) times daily. 12/11/23   Bacchus, Meade PEDLAR, FNP  Plecanatide  (TRULANCE ) 3 MG TABS Take 1 tablet (3 mg total) by mouth daily. 04/23/24 12/19/24  Kennedy Charmaine CROME, NP  polyethylene glycol powder (GLYCOLAX /MIRALAX ) 17 GM/SCOOP powder 1-4 scoop daily or as needed 12/17/21   Jayne Vonn DEL, MD  promethazine -dextromethorphan (PROMETHAZINE -DM) 6.25-15 MG/5ML syrup Take 5 mLs by mouth 4 (four) times daily as needed. 05/06/24   Bacchus, Meade PEDLAR, FNP  QUEtiapine  (SEROQUEL ) 100 MG tablet Take 1 tablet (100 mg total) by mouth 2 (two) times daily. 08/15/24   Okey Barnie SAUNDERS, MD  Vitamin D , Ergocalciferol , (DRISDOL ) 1.25 MG (50000 UNIT) CAPS capsule Take 1 capsule (50,000 Units total) by mouth every 7 (seven) days. 12/12/23   Bacchus, Meade PEDLAR, FNP    Allergies: Hydrocodone-acetaminophen , Lortab [hydrocodone-acetaminophen ], and Morphine and codeine    Review of Systems  Constitutional:  Negative for fever.  Respiratory:  Negative for shortness of breath.   Cardiovascular:  Positive for chest pain.  Gastrointestinal:  Positive for abdominal pain, anal bleeding, nausea and vomiting. Negative for constipation and diarrhea.  Genitourinary:  Negative for dysuria.  Neurological:  Negative for headaches.  All other systems reviewed and are negative.   Updated Vital Signs BP 112/77 (BP Location: Right Arm)   Pulse 85   Temp 98.7 F (37.1 C) (Oral)   Resp 16   Ht 5' 3 (1.6 m)   Wt 76.2  kg   SpO2 100%   BMI 29.76 kg/m   Physical Exam Constitutional:      Appearance: She is well-developed.  HENT:     Head: Normocephalic and atraumatic.  Cardiovascular:     Rate and Rhythm: Normal rate.  Pulmonary:     Effort: Pulmonary effort is normal.     Breath sounds: Normal breath sounds.  Chest:     Comments: Generalized tenderness worse in the epigastric and left upper quadrant.  No masses, distention, guarding. Abdominal:     General: Bowel sounds are normal.     Palpations: Abdomen is soft. There is no mass.     Tenderness: There is abdominal tenderness. There is no guarding.  Musculoskeletal:        General: Normal range of motion.  Skin:    General: Skin is warm and dry.  Neurological:     Mental  Status: She is alert and oriented to person, place, and time.  Psychiatric:     Comments: anxious     (all labs ordered are listed, but only abnormal results are displayed) Labs Reviewed  COMPREHENSIVE METABOLIC PANEL WITH GFR - Abnormal; Notable for the following components:      Result Value   CO2 19 (*)    Glucose, Bld 102 (*)    AST 191 (*)    ALT 85 (*)    Alkaline Phosphatase 144 (*)    Anion gap 17 (*)    All other components within normal limits  CBC WITH DIFFERENTIAL/PLATELET - Abnormal; Notable for the following components:   WBC 13.7 (*)    MCH 34.6 (*)    Neutro Abs 9.6 (*)    All other components within normal limits  LIPASE, BLOOD  HCG, SERUM, QUALITATIVE  URINALYSIS, ROUTINE W REFLEX MICROSCOPIC  PREGNANCY, URINE  TROPONIN T, HIGH SENSITIVITY    EKG: EKG Interpretation Date/Time:  Friday November 22 2024 16:27:16 EST Ventricular Rate:  98 PR Interval:  144 QRS Duration:  70 QT Interval:  350 QTC Calculation: 446 R Axis:   63  Text Interpretation: Normal sinus rhythm Low voltage QRS Confirmed by Francesca Fallow ([id number]) on 11/22/2024 4:40:16 PM  Radiology: DG Chest Portable 1 View Result Date: 11/22/2024 EXAM: 1 VIEW(S) XRAY OF  THE CHEST 11/22/2024 05:05:00 PM COMPARISON: 06/24/2024 CLINICAL HISTORY: chest pain FINDINGS: LUNGS AND PLEURA: No focal pulmonary opacity. No pleural effusion. No pneumothorax. HEART AND MEDIASTINUM: No acute abnormality of the cardiac and mediastinal silhouettes. BONES AND SOFT TISSUES: No acute osseous abnormality. IMPRESSION: 1. No acute cardiopulmonary abnormality. Electronically signed by: Greig Pique MD 11/22/2024 05:46 PM EST RP Workstation: [id number]     Medications Ordered in the ED  famotidine (PEPCID) IVPB 20 mg premix (has no administration in time range)  lactated ringers  bolus 1,000 mL (has no administration in time range)  HYDROmorphone  (DILAUDID ) injection 1 mg (1 mg Intravenous Given 11/22/24 1817)  ondansetron  (ZOFRAN ) injection 4 mg (4 mg Intravenous Given 11/22/24 1817)  iohexol  (OMNIPAQUE ) 300 MG/ML solution 100 mL (100 mLs Intravenous Contrast Given 11/22/24 1846)                                   Medical Decision Making Patient is a 38 year old female with a history of chronic abdominal pain, IBS, hypertension, hyperlipidemia, and bipolar 1 disorder who presents to the ED for increasing abdominal pain and chest pain that worsened this morning.  Please see detailed HPI above.  On exam patient is alert and uncomfortable lying in the bed.  Physical exam as noted above.  Differential includes acute viral illness, IBS flare, IBD, acute abdomen, PUD, hemorrhoids, GI bleed.  On labs, mild leukocytosis of 13.7 noted.  LFTs mildly acutely elevated from labs 11 months prior.  Urine pregnancy negative, UA pending.  Initial troponin less than 15 and negative.  Patient given morphine and Zofran , will plan to give a liter of fluids and Pepcid as well.  Plan is for reevaluation pending CT abdomen pelvis.  If there are no acute findings, plan for discharge home with oxycodone  and Zofran .  Patient has Linzess  at home to begin and does have GI follow-up next week.  Patient signed out to  [facility], PA-C at shift change.  Please see her note for complete disposition.    Amount and/or Complexity of Data Reviewed Labs:  ordered. Radiology: ordered.  Risk Prescription drug management.      Final diagnoses:  Generalized abdominal pain  Nausea and vomiting, unspecified vomiting type  Hemorrhoids, unspecified hemorrhoid type    ED Discharge Orders          Ordered    oxyCODONE  (ROXICODONE ) 5 MG immediate release tablet  Every 6 hours PRN        11/22/24 1851    ondansetron  (ZOFRAN ) 4 MG tablet  Every 6 hours        11/22/24 1851               Neysa Thersia RAMAN, PA-C 11/22/24 1907    Neysa Thersia RAMAN, PA-C 11/22/24 1907    Francesca Elsie CROME, MD 11/22/24 2334  "

## 2024-11-22 NOTE — Discharge Instructions (Addendum)
 Your workup was reassuring.  You have been prescribed short course of pain medication medication for nausea vomiting.  Please call your GI provider on Monday to arrange a follow up appt

## 2024-11-22 NOTE — ED Triage Notes (Signed)
 Pt comes for abd pain and CP. Abd. Pain is all over. Pain started around 1300 today. Pt believes it gallbladder phantom pain. Pt also has rectal bleeding. Pt has of IBS and extensive GI hx. A&Ox4.    Pt's gallbladder removed.

## 2024-11-25 NOTE — Telephone Encounter (Signed)
 Routing to you in Courtney's absence.

## 2024-11-25 NOTE — Telephone Encounter (Signed)
 That's fine to do a work note and send through [employer]. Thanks!

## 2024-11-26 ENCOUNTER — Encounter: Payer: Self-pay | Admitting: Family Medicine

## 2024-11-26 DIAGNOSIS — R7989 Other specified abnormal findings of blood chemistry: Secondary | ICD-10-CM

## 2024-11-26 NOTE — Telephone Encounter (Signed)
 We need to get patient in sooner than February.  She was rescheduled due to a provider being out. Needs to take priority for an appointment.   Dena: I have ordered repeat labs if she can have that done at [facility].   Scheduling: can we arrange an ASAP MRI/MRCP due to concern for biliary obstruction.    Ladonna: if anyone cancels tomorrow, please call her to see if she can come right in.   Otherwise, need to get with Charmaine on when she can see her.

## 2024-11-27 ENCOUNTER — Ambulatory Visit: Admitting: Gastroenterology

## 2024-11-27 NOTE — Telephone Encounter (Signed)
 RadMD PA for MRI/MRCP: Request ID:Not Available Tracking: [id number] Request Date: 11/27/2024 06:23 AM Status: In Review

## 2024-11-28 LAB — CBC WITH DIFFERENTIAL/PLATELET
Basophils Absolute: 0.1 x10E3/uL (ref 0.0–0.2)
Basos: 1 %
EOS (ABSOLUTE): 0.4 x10E3/uL (ref 0.0–0.4)
Eos: 5 %
Hematocrit: 39.6 % (ref 34.0–46.6)
Hemoglobin: 13.8 g/dL (ref 11.1–15.9)
Immature Grans (Abs): 0 x10E3/uL (ref 0.0–0.1)
Immature Granulocytes: 0 %
Lymphocytes Absolute: 2.1 x10E3/uL (ref 0.7–3.1)
Lymphs: 25 %
MCH: 35 pg — ABNORMAL HIGH (ref 26.6–33.0)
MCHC: 34.8 g/dL (ref 31.5–35.7)
MCV: 101 fL — ABNORMAL HIGH (ref 79–97)
Monocytes Absolute: 0.6 x10E3/uL (ref 0.1–0.9)
Monocytes: 7 %
Neutrophils Absolute: 5.5 x10E3/uL (ref 1.4–7.0)
Neutrophils: 62 %
Platelets: 269 x10E3/uL (ref 150–450)
RBC: 3.94 x10E6/uL (ref 3.77–5.28)
RDW: 12.1 % (ref 11.7–15.4)
WBC: 8.7 x10E3/uL (ref 3.4–10.8)

## 2024-11-28 LAB — COMPREHENSIVE METABOLIC PANEL WITH GFR
ALT: 245 IU/L — ABNORMAL HIGH (ref 0–32)
AST: 65 IU/L — ABNORMAL HIGH (ref 0–40)
Albumin: 4.7 g/dL (ref 3.9–4.9)
Alkaline Phosphatase: 164 IU/L — ABNORMAL HIGH (ref 41–116)
BUN/Creatinine Ratio: 12 (ref 9–23)
BUN: 12 mg/dL (ref 6–20)
Bilirubin Total: 0.3 mg/dL (ref 0.0–1.2)
CO2: 23 mmol/L (ref 20–29)
Calcium: 9.7 mg/dL (ref 8.7–10.2)
Chloride: 100 mmol/L (ref 96–106)
Creatinine, Ser: 1.01 mg/dL — ABNORMAL HIGH (ref 0.57–1.00)
Globulin, Total: 2 g/dL (ref 1.5–4.5)
Glucose: 134 mg/dL — ABNORMAL HIGH (ref 70–99)
Potassium: 4.7 mmol/L (ref 3.5–5.2)
Sodium: 139 mmol/L (ref 134–144)
Total Protein: 6.7 g/dL (ref 6.0–8.5)
eGFR: 74 mL/min/1.73

## 2024-11-28 LAB — LIPASE: Lipase: 41 U/L (ref 14–72)

## 2024-11-28 LAB — HEPATIC FUNCTION PANEL
ALT: 256 IU/L — ABNORMAL HIGH (ref 0–32)
AST: 66 IU/L — ABNORMAL HIGH (ref 0–40)
Albumin: 4.6 g/dL (ref 3.9–4.9)
Alkaline Phosphatase: 168 IU/L — ABNORMAL HIGH (ref 41–116)
Bilirubin Total: 0.3 mg/dL (ref 0.0–1.2)
Bilirubin, Direct: 0.1 mg/dL (ref 0.00–0.40)
Total Protein: 6.8 g/dL (ref 6.0–8.5)

## 2024-11-28 LAB — GAMMA GT: GGT: 239 IU/L — ABNORMAL HIGH (ref 0–60)

## 2024-11-29 ENCOUNTER — Other Ambulatory Visit: Payer: Self-pay | Admitting: Family Medicine

## 2024-11-29 DIAGNOSIS — K219 Gastro-esophageal reflux disease without esophagitis: Secondary | ICD-10-CM

## 2024-12-02 ENCOUNTER — Ambulatory Visit: Payer: Self-pay | Admitting: Gastroenterology

## 2024-12-02 ENCOUNTER — Encounter: Payer: Self-pay | Admitting: *Deleted

## 2024-12-02 ENCOUNTER — Other Ambulatory Visit: Payer: Self-pay | Admitting: *Deleted

## 2024-12-02 ENCOUNTER — Other Ambulatory Visit: Payer: Self-pay | Admitting: Gastroenterology

## 2024-12-02 DIAGNOSIS — R109 Unspecified abdominal pain: Secondary | ICD-10-CM

## 2024-12-02 DIAGNOSIS — R112 Nausea with vomiting, unspecified: Secondary | ICD-10-CM

## 2024-12-02 DIAGNOSIS — K8050 Calculus of bile duct without cholangitis or cholecystitis without obstruction: Secondary | ICD-10-CM

## 2024-12-02 MED ORDER — ONDANSETRON HCL 4 MG PO TABS: 4.0000 mg | ORAL_TABLET | Freq: Four times a day (QID) | ORAL | 0 refills | Status: DC

## 2024-12-02 NOTE — Telephone Encounter (Signed)
 RadMD PA for MRI/MRCP: Request ID: [id number] Tracking: [id number] Request Date: 11/27/2024 06:23 AM Status: Approved Entry Method: RadMD Validity Dates: 11/27/2024-01/26/2025

## 2024-12-04 ENCOUNTER — Other Ambulatory Visit: Payer: Self-pay | Admitting: Gastroenterology

## 2024-12-04 ENCOUNTER — Ambulatory Visit (HOSPITAL_COMMUNITY)
Admission: RE | Admit: 2024-12-04 | Discharge: 2024-12-04 | Disposition: A | Discharge status: Home / Self Care | Source: Ambulatory Visit | Attending: Gastroenterology | Admitting: Gastroenterology

## 2024-12-04 DIAGNOSIS — R109 Unspecified abdominal pain: Secondary | ICD-10-CM | POA: Insufficient documentation

## 2024-12-04 MED ORDER — GADOBUTROL 1 MMOL/ML IV SOLN
7.0000 mL | Freq: Once | INTRAVENOUS | Status: AC | PRN
  Administered 2024-12-04: 7 mL via INTRAVENOUS

## 2024-12-05 NOTE — Progress Notes (Incomplete)
 " Cardiology Office Note:   Date:  12/05/2024  ID:  Sandra Glover, DOB Feb 20, 1986, MRN [id number] PCP: [name], FNP  [facility] Providers Cardiologist:  Georganna Archer, MD { Chief Complaint:  No chief complaint on file.     History of Present Illness:   Sandra Glover is a 39 y.o. female with a PMH of HTN, GERD, cholelithiasis s/p cholecystectomy and mood disorders who presents for follow-up  She presented to the ED on 06/24/2024 with a complaint of chest pain.  Her workup including troponins, EKG and CXR were all unremarkable.  She reports that for the past several years she has had ongoing chest pain that has become progressively worse over the past few months.  She describes having a sharp/aching pain underneath her left breast that moves to the center of her chest.  The chest pain occurs with exertion and resolves at rest.  She also states that when she takes Xanax  for chest pain can sometimes improve slightly, but not always.  It is associated with lightheadedness, diaphoresis, and shortness of breath.  She further endorses lower extremity swelling, palpitations but denies PND, orthopnea and syncope.  No other symptoms.  Her family history is notable for father who has had multiple Mis (first at age 66), mother with  an enlarged heart, and maternal grandparents who had CHF.  She denies tobacco and illicit drug use.  Occasional EtOH use.  She has 2 children who are healthy.  She is currently on birth control with a Nexplanon .  She lives in [place] and is currently in school.  Interval History 12/06/24: -   Past Medical History:  Diagnosis Date   Anxiety    Anxiety    Arthritis    Depression    History of anemia 2013   History of depression    states stopped med. 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   Hypertension    Osteopenia    Pertussis    Postcoital bleeding 07/14/2015   Sinus infection  01/25/2013   started antibiotic 01/24/2013 x 10 days; nasal congestion, clear drainage from nose   Tonsillar and adenoid hypertrophy 01/2013   Vaginal irritation 03/25/2014   Mild yeast but has history of yeast, will not treat til after first trimester,try yogurt     Studies Reviewed:    EKG:        Cardiac Studies & Procedures   ______________________________________________________________________________________________     ECHOCARDIOGRAM  ECHOCARDIOGRAM COMPLETE 10/02/2024  Narrative ECHOCARDIOGRAM REPORT    Patient Name:   Sandra Glover Date of Exam: 10/02/2024 Medical Rec #:  [id number]          Height:       63.0 in Accession #:    [id number]         Weight:       170.0 lb Date of Birth:  14-Sep-1986          BSA:          1.805 m Patient Age:    38 years           BP:           127/88 mmHg Patient Gender: F                  HR:           84 bpm. Exam Location:  [address]  Procedure: 2D Echo,  Cardiac Doppler, Color Doppler and Intracardiac Opacification Agent (Both Spectral and Color Flow Doppler were utilized during procedure).  Indications:    R06.9 DOE  History:        Patient has no prior history of Echocardiogram examinations. Signs/Symptoms:Chest Pain; Risk Factors:Hypertension.  Sonographer:    Augustin Seals RDCS Referring Phys: [id number] GEORGANNA ARCHER  IMPRESSIONS   1. Left ventricular ejection fraction, by estimation, is 60 to 65%. The left ventricle has normal function. The left ventricle has no regional wall motion abnormalities. Left ventricular diastolic parameters were normal. 2. Right ventricular systolic function is normal. The right ventricular size is not well visualized. 3. The mitral valve is normal in structure. No evidence of mitral valve regurgitation. No evidence of mitral stenosis. 4. The aortic valve was not well visualized. Aortic valve regurgitation is not visualized. 5. The inferior vena cava is normal in size with  greater than 50% respiratory variability, suggesting right atrial pressure of 3 mmHg.  Comparison(s): No prior Echocardiogram.  FINDINGS Left Ventricle: Left ventricular ejection fraction, by estimation, is 60 to 65%. The left ventricle has normal function. The left ventricle has no regional wall motion abnormalities. Definity  contrast agent was given IV to delineate the left ventricular endocardial borders. The left ventricular internal cavity size was normal in size. There is no left ventricular hypertrophy. Left ventricular diastolic parameters were normal.  Right Ventricle: The right ventricular size is not well visualized. No increase in right ventricular wall thickness. Right ventricular systolic function is normal.  Left Atrium: Left atrial size was normal in size.  Right Atrium: Right atrial size was normal in size.  Pericardium: There is no evidence of pericardial effusion.  Mitral Valve: The mitral valve is normal in structure. No evidence of mitral valve regurgitation. No evidence of mitral valve stenosis.  Tricuspid Valve: The tricuspid valve is normal in structure. Tricuspid valve regurgitation is not demonstrated. No evidence of tricuspid stenosis.  Aortic Valve: The aortic valve was not well visualized. Aortic valve regurgitation is not visualized.  Pulmonic Valve: The pulmonic valve was normal in structure. Pulmonic valve regurgitation is mild. No evidence of pulmonic stenosis.  Aorta: The aortic root and ascending aorta are structurally normal, with no evidence of dilitation.  Venous: The inferior vena cava is normal in size with greater than 50% respiratory variability, suggesting right atrial pressure of 3 mmHg.  IAS/Shunts: No atrial level shunt detected by color flow Doppler.  Additional Comments: 3D was performed not requiring image post processing on an independent workstation and was normal.   LEFT VENTRICLE PLAX 2D LVIDd:         4.50 cm    Diastology LVIDs:         2.70 cm   LV e' medial:    10.80 cm/s LV PW:         0.80 cm   LV E/e' medial:  8.9 LV IVS:        0.80 cm   LV e' lateral:   14.50 cm/s LVOT diam:     2.10 cm   LV E/e' lateral: 6.6 LV SV:         69 LV SV Index:   38 LVOT Area:     3.46 cm  3D Volume EF: 3D EF:        66 % LV EDV:       110 ml LV ESV:       38 ml LV SV:        72  ml  RIGHT VENTRICLE             IVC RV Basal diam:  3.30 cm     IVC diam: 1.10 cm RV Mid diam:    2.70 cm RV S prime:     12.30 cm/s  PULMONARY VEINS TAPSE (M-mode): 2.1 cm      Diastolic Velocity: 55.50 cm/s S/D Velocity:       1.20 Systolic Velocity:  64.90 cm/s  LEFT ATRIUM             Index        RIGHT ATRIUM           Index LA diam:        3.20 cm 1.77 cm/m   RA Area:     11.10 cm LA Vol (A2C):   15.6 ml 8.64 ml/m   RA Volume:   21.90 ml  12.14 ml/m LA Vol (A4C):   18.2 ml 10.09 ml/m LA Biplane Vol: 16.8 ml 9.31 ml/m AORTIC VALVE LVOT Vmax:   97.60 cm/s LVOT Vmean:  65.500 cm/s LVOT VTI:    0.200 m  AORTA Ao Root diam: 2.40 cm Ao Asc diam:  2.60 cm  MITRAL VALVE MV Area (PHT): 4.29 cm    SHUNTS MV Decel Time: 177 msec    Systemic VTI:  0.20 m MV E velocity: 96.20 cm/s  Systemic Diam: 2.10 cm MV A velocity: 72.00 cm/s MV E/A ratio:  1.34  Stanly Leavens MD Electronically signed by Stanly Leavens MD Signature Date/Time: 10/02/2024/12:19:47 PM    Final      CT SCANS  CT CORONARY MORPH W/CTA COR W/SCORE 09/16/2024  Addendum 09/25/2024 10:35 AM ADDENDUM REPORT: 09/25/2024 10:32  EXAM: OVER-READ INTERPRETATION  CT CHEST  The following report is an over-read performed by radiologist Dr. Andrea Gasman of [facility] on 09/25/2024. This over-read does not include interpretation of cardiac or coronary anatomy or pathology. The coronary CTA interpretation by the cardiologist is attached.  COMPARISON:  None.  FINDINGS: Vascular: No aortic atherosclerosis. The  included aorta is normal in caliber.  Mediastinum/nodes: No adenopathy or mass. Unremarkable esophagus.  Lungs: No focal airspace disease. No pulmonary nodule. No pleural fluid. The included airways are patent.  Upper abdomen: No acute or unexpected findings.  Musculoskeletal: There are no acute or suspicious osseous abnormalities.  IMPRESSION: No significant extracardiac findings.   Electronically Signed By: Andrea Gasman M.D. On: 09/25/2024 10:32  Narrative HISTORY: DOE, Chest pain  EXAM: Cardiac/Coronary CT  TECHNIQUE: The patient was scanned on a [employer].  PROTOCOL: A 120 kV prospective scan was triggered in the descending thoracic aorta at 111 HU's. Axial non-contrast 3 mm slices were carried out through the heart. The data set was analyzed on a dedicated work station and scored using the Agatston method. Gantry rotation speed was 250 msecs and collimation was 0.6 mm. Heart rate was optimized medically and sl NTG was given. The 3D data set was reconstructed in 5% intervals of the 35-75 % of the R-R cycle. Systolic and diastolic phases were analyzed on a dedicated work station using MPR, MIP and VRT modes. The patient received 100mL OMNIPAQUE  IOHEXOL  350 MG/ML SOLN of contrast.  FINDINGS: Image Quality: Excellent  Coronary calcium score: The patient's coronary artery calcium score is 0.  Coronary arteries: Co-dominant coronary system. Normal coronary origins.  Left Main: Normal caliber vessel. No significant plaque or stenosis.  Left Anterior Descending: Normal caliber vessel. No significant plaque or stenosis.  Diagonal branches: Normal caliber without significant plaque or stenosis.  Left Circumflex Artery: Normal caliber vessel. No significant plaque or stenosis.  Obtuse Marginal branches: Normal caliber without significant plaque or stenosis.  Right Coronary Artery: Normal caliber vessel. No significant plaque or  stenosis.  EXTRA-CORONARY FINDINGS: EXTRA-CORONARY FINDINGS Aorta: Normal size, 21 mm x 20 mm at the mid ascending aorta (level of the PA bifurcation) measured double oblique. No aortic atherosclerosis. No dissection seen in visualized portions of the aorta.  Normal size of the pulmonary artery  Aortic Valve: Trileaflet without calcifications.  Normal pulmonary vein drainage into the left atrium.  Normal left atrial appendage without a thrombus.  Normal appearance of the pericardium.  Non-cardiac findings will be assessed and reported in a separate document by radiology.  IMPRESSION: 1. No evidence of CAD, CADRADS = 0. CT FFR will not be performed.  2. Coronary calcium score of 0.  3. Normal coronary origin with co-dominance.  INTERPRETATION:  1. CAD-RADS 0: No evidence of CAD (0%). Consider non-atherosclerotic causes of chest pain.  Electronically Signed: By: Georganna Archer On: 09/16/2024 14:12     ______________________________________________________________________________________________      Risk Assessment/Calculations:     No BP recorded.  {Refresh Note OR Click here to enter BP  :1}***        Physical Exam:     VS:  There were no vitals taken for this visit.     Wt Readings from Last 3 Encounters:  11/22/24 168 lb (76.2 kg)  08/20/24 170 lb (77.1 kg)  06/24/24 180 lb (81.6 kg)     GEN: Well nourished, well developed, in no acute distress NECK: No JVD; No carotid bruits CARDIAC: RRR, no murmurs, rubs, gallops RESPIRATORY:  Clear to auscultation without rales, wheezing or rhonchi  ABDOMEN: Soft, non-tender, non-distended, normal bowel sounds EXTREMITIES: Somewhat cool extremities but palpable distal pulses, no edema; No deformity, 2+ radial pulses PSYCH: Normal mood and affect   Assessment & Plan Chest pain of uncertain etiology Cardiac MRI     This note was written with the assistance of a dictation microphone or AI dictation  software. Please excuse any typos or grammatical errors.   Signed, Georganna Archer, MD 12/05/2024 10:16 PM    [facility]  "

## 2024-12-06 ENCOUNTER — Encounter: Payer: Self-pay | Admitting: Gastroenterology

## 2024-12-06 ENCOUNTER — Ambulatory Visit
Attending: Student in an Organized Health Care Education/Training Program | Admitting: Student in an Organized Health Care Education/Training Program

## 2024-12-06 ENCOUNTER — Other Ambulatory Visit: Payer: Self-pay | Admitting: Gastroenterology

## 2024-12-06 DIAGNOSIS — R7989 Other specified abnormal findings of blood chemistry: Secondary | ICD-10-CM

## 2024-12-06 DIAGNOSIS — R112 Nausea with vomiting, unspecified: Secondary | ICD-10-CM

## 2024-12-06 DIAGNOSIS — R748 Abnormal levels of other serum enzymes: Secondary | ICD-10-CM

## 2024-12-06 DIAGNOSIS — R079 Chest pain, unspecified: Secondary | ICD-10-CM

## 2024-12-09 DIAGNOSIS — R748 Abnormal levels of other serum enzymes: Secondary | ICD-10-CM

## 2024-12-09 DIAGNOSIS — R109 Unspecified abdominal pain: Secondary | ICD-10-CM

## 2024-12-09 DIAGNOSIS — R7989 Other specified abnormal findings of blood chemistry: Secondary | ICD-10-CM

## 2024-12-09 NOTE — Telephone Encounter (Signed)
 Pt read MyChart message. Pt responded to [employer].

## 2024-12-10 ENCOUNTER — Other Ambulatory Visit: Payer: Self-pay

## 2024-12-10 ENCOUNTER — Encounter (HOSPITAL_COMMUNITY): Payer: Self-pay

## 2024-12-10 ENCOUNTER — Emergency Department (HOSPITAL_COMMUNITY)

## 2024-12-10 ENCOUNTER — Emergency Department (HOSPITAL_COMMUNITY)
Admission: EM | Admit: 2024-12-10 | Discharge: 2024-12-10 | Disposition: A | Discharge status: Home / Self Care | Source: Ambulatory Visit

## 2024-12-10 DIAGNOSIS — R059 Cough, unspecified: Secondary | ICD-10-CM | POA: Diagnosis present

## 2024-12-10 DIAGNOSIS — J069 Acute upper respiratory infection, unspecified: Secondary | ICD-10-CM | POA: Insufficient documentation

## 2024-12-10 DIAGNOSIS — E860 Dehydration: Secondary | ICD-10-CM | POA: Insufficient documentation

## 2024-12-10 DIAGNOSIS — R1013 Epigastric pain: Secondary | ICD-10-CM | POA: Diagnosis not present

## 2024-12-10 DIAGNOSIS — R109 Unspecified abdominal pain: Secondary | ICD-10-CM

## 2024-12-10 LAB — ANA W/REFLEX IF POSITIVE
Anti JO-1: <0.2 AI (ref 0.0–0.9)
Anti Nuclear Antibody (ANA): POSITIVE — AB
Centromere Ab Screen: <0.2 AI (ref 0.0–0.9)
Chromatin Ab SerPl-aCnc: 0.2 AI (ref 0.0–0.9)
ENA RNP Ab: <0.2 AI (ref 0.0–0.9)
ENA SM Ab Ser-aCnc: <0.2 AI (ref 0.0–0.9)
ENA SSA (RO) Ab: <0.2 AI (ref 0.0–0.9)
ENA SSB (LA) Ab: <0.2 AI (ref 0.0–0.9)
Scleroderma (Scl-70) (ENA) Antibody, IgG: <0.2 AI (ref 0.0–0.9)
dsDNA Ab: 3 [IU]/mL (ref 0–9)

## 2024-12-10 LAB — COMPREHENSIVE METABOLIC PANEL WITH GFR
ALT: 26 U/L (ref 0–44)
AST: 27 U/L (ref 15–41)
Albumin: 4.4 g/dL (ref 3.5–5.0)
Alkaline Phosphatase: 142 U/L — ABNORMAL HIGH (ref 38–126)
Anion gap: 11 (ref 5–15)
BUN: 9 mg/dL (ref 6–20)
CO2: 26 mmol/L (ref 22–32)
Calcium: 9.1 mg/dL (ref 8.9–10.3)
Chloride: 101 mmol/L (ref 98–111)
Creatinine, Ser: 0.91 mg/dL (ref 0.44–1.00)
GFR, Estimated: >60 mL/min
Glucose, Bld: 117 mg/dL — ABNORMAL HIGH (ref 70–99)
Potassium: 4 mmol/L (ref 3.5–5.1)
Sodium: 138 mmol/L (ref 135–145)
Total Bilirubin: <0.2 mg/dL (ref 0.0–1.2)
Total Protein: 6.6 g/dL (ref 6.5–8.1)

## 2024-12-10 LAB — URINALYSIS, ROUTINE W REFLEX MICROSCOPIC
Bilirubin Urine: NEGATIVE
Glucose, UA: NEGATIVE mg/dL
Hgb urine dipstick: NEGATIVE
Ketones, ur: NEGATIVE mg/dL
Leukocytes,Ua: NEGATIVE
Nitrite: NEGATIVE
Protein, ur: NEGATIVE mg/dL
Specific Gravity, Urine: 1.021 (ref 1.005–1.030)
pH: 5 (ref 5.0–8.0)

## 2024-12-10 LAB — CBC
HCT: 38.8 % (ref 36.0–46.0)
Hemoglobin: 13.3 g/dL (ref 12.0–15.0)
MCH: 33.4 pg (ref 26.0–34.0)
MCHC: 34.3 g/dL (ref 30.0–36.0)
MCV: 97.5 fL (ref 80.0–100.0)
Platelets: 325 K/uL (ref 150–400)
RBC: 3.98 MIL/uL (ref 3.87–5.11)
RDW: 12 % (ref 11.5–15.5)
WBC: 18.2 K/uL — ABNORMAL HIGH (ref 4.0–10.5)
nRBC: 0 % (ref 0.0–0.2)

## 2024-12-10 LAB — IGM: IgM (Immunoglobulin M), Srm: 70 mg/dL (ref 26–217)

## 2024-12-10 LAB — MITOCHONDRIAL ANTIBODIES: Mitochondrial Ab: <20 U (ref 0.0–20.0)

## 2024-12-10 LAB — GAMMA GT: GGT: 94 IU/L — ABNORMAL HIGH (ref 0–60)

## 2024-12-10 LAB — LIPASE, BLOOD: Lipase: 50 U/L (ref 11–51)

## 2024-12-10 LAB — IGG: IgG (Immunoglobin G), Serum: 624 mg/dL (ref 586–1602)

## 2024-12-10 LAB — POC URINE PREG, ED: Preg Test, Ur: NEGATIVE

## 2024-12-10 MED ORDER — ALBUTEROL SULFATE HFA 108 (90 BASE) MCG/ACT IN AERS
1.0000 | INHALATION_SPRAY | Freq: Once | RESPIRATORY_TRACT | Status: AC
  Administered 2024-12-10: 2 via RESPIRATORY_TRACT
  Filled 2024-12-10: qty 6.7

## 2024-12-10 MED ORDER — ONDANSETRON HCL 4 MG/2ML IJ SOLN
4.0000 mg | Freq: Once | INTRAMUSCULAR | Status: AC
  Administered 2024-12-10: 4 mg via INTRAVENOUS
  Filled 2024-12-10: qty 2

## 2024-12-10 MED ORDER — LACTATED RINGERS IV BOLUS
1000.0000 mL | Freq: Once | INTRAVENOUS | Status: AC
  Administered 2024-12-10: 1000 mL via INTRAVENOUS

## 2024-12-10 MED ORDER — HYDROCODONE BIT-HOMATROP MBR 5-1.5 MG/5ML PO SOLN: 5.0000 mL | Freq: Four times a day (QID) | ORAL | 0 refills | Status: DC | PRN

## 2024-12-10 MED ORDER — HYDROCODONE BIT-HOMATROP MBR 5-1.5 MG/5ML PO SOLN
5.0000 mL | Freq: Once | ORAL | Status: AC
  Administered 2024-12-10: 5 mL via ORAL
  Filled 2024-12-10: qty 5

## 2024-12-10 MED ORDER — IPRATROPIUM-ALBUTEROL 0.5-2.5 (3) MG/3ML IN SOLN
3.0000 mL | Freq: Once | RESPIRATORY_TRACT | Status: AC
  Administered 2024-12-10: 3 mL via RESPIRATORY_TRACT
  Filled 2024-12-10: qty 3

## 2024-12-10 MED ORDER — METHOCARBAMOL 500 MG PO TABS: 500.0000 mg | ORAL_TABLET | Freq: Four times a day (QID) | ORAL | 0 refills | Status: DC | PRN

## 2024-12-10 NOTE — Discharge Instructions (Signed)
 Take your cough medication as prescribed and use your inhaler as needed.  You can use Tylenol  as needed for pain.  Follow-up with your primary care doctor and your GI doctor.

## 2024-12-10 NOTE — ED Triage Notes (Signed)
 Pt states that she has had abdominal pain since the 19th and was seen here. Pt followed up with her gastro doctor and was told to return here if pain got worse or she developed a fever. Pt states she starting running a fever today and has developed a bad cough.

## 2024-12-10 NOTE — ED Provider Notes (Signed)
 " [place] EMERGENCY DEPARTMENT AT [facility] Provider Note   CSN: [id number] Arrival date & time: 12/10/24  1924     Patient presents with: Cough and Abdominal Pain   Sandra Glover is a 39 y.o. female.  {Add pertinent medical, surgical, social history, OB history to HPI:5537} 39 year old female presents for evaluation of cough and abdominal pain.  States she sees GI and was recently here for abdominal pain and came back because abdominal pain got worse after she has been coughing for the last few days.  States he has been short of breath and had a runny nose as well.  Admits to some nausea as well.  Denies any fevers or any other symptoms or concerns.   Cough Associated symptoms: no chest pain, no chills, no ear pain, no fever, no rash, no shortness of breath and no sore throat   Abdominal Pain Associated symptoms: cough   Associated symptoms: no chest pain, no chills, no dysuria, no fever, no hematuria, no shortness of breath, no sore throat and no vomiting        Prior to Admission medications  Medication Sig Start Date End Date Taking? Authorizing Provider  ALPRAZolam  (XANAX ) 1 MG tablet Take 1 tablet (1 mg total) by mouth 2 (two) times daily as needed for anxiety. 08/15/24 08/15/25  Okey Barnie SAUNDERS, MD  amphetamine -dextroamphetamine  (ADDERALL) 20 MG tablet Take 1 tablet (20 mg total) by mouth 2 (two) times daily. 08/15/24 08/15/25  Okey Barnie SAUNDERS, MD  benzonatate  (TESSALON ) 100 MG capsule Take 1 capsule (100 mg total) by mouth 3 (three) times daily as needed for cough. Do not take with alcohol or while operating or driving heavy machinery 06/09/73   Chandra Harlene LABOR, NP  calcium-vitamin D  (OSCAL WITH D) 500-200 MG-UNIT tablet Take 1 tablet by mouth.    [provider]  Drospirenone -Estetrol  3-14.2 MG TABS Take 1 tablet by mouth daily. 12/22/21   Jayne Vonn VEAR, MD  etonogestrel  (NEXPLANON ) 68 MG IMPL implant 1 each by Subdermal route once.    [provider]  FLUoxetine  (PROZAC ) 40 MG capsule Take 1 capsule (40 mg total) by mouth daily. 08/15/24   Okey Barnie SAUNDERS, MD  gabapentin  (NEURONTIN ) 100 MG capsule Take 1 capsule (100 mg total) by mouth 3 (three) times daily. 07/25/23   Margrette Taft BRAVO, MD  linaclotide  (LINZESS ) 290 MCG CAPS capsule Take 1 capsule (290 mcg total) by mouth daily before breakfast. 11/13/24   Kennedy Charmaine CROME, NP  lisinopril -hydrochlorothiazide  (ZESTORETIC ) 20-12.5 MG tablet TAKE 1 TABLET BY MOUTH DAILY 08/08/24   Bacchus, Gloria Z, FNP  lubiprostone  (AMITIZA ) 8 MCG capsule TAKE 1 CAPSULE(8 MCG) BY MOUTH TWICE DAILY WITH A MEAL 09/23/24   Kennedy Charmaine CROME, NP  meloxicam (MOBIC) 7.5 MG tablet Take 7.5 mg by mouth daily as needed for pain. 08/15/19   [provider]  metoprolol  tartrate (LOPRESSOR ) 100 MG tablet Take 1 tablet (100 mg total) by mouth once for 1 dose. Take 90-120 minutes prior to scan. Hold for SBP less than 110. 08/20/24 08/21/24  Floretta Mallard, MD  ondansetron  (ZOFRAN ) 4 MG tablet Take 1 tablet (4 mg total) by mouth every 6 (six) hours. 12/02/24   Kennedy Charmaine CROME, NP  oxyCODONE  (ROXICODONE ) 5 MG immediate release tablet Take 1 tablet (5 mg total) by mouth every 6 (six) hours as needed for up to 10 doses for severe pain (pain score 7-10). 11/22/24   Neysa Thersia RAMAN, PA-C  pantoprazole  (PROTONIX ) 40  MG tablet TAKE 1 TABLET(40 MG) BY MOUTH TWICE DAILY 12/02/24   Bacchus, Gloria Z, FNP  Plecanatide  (TRULANCE ) 3 MG TABS Take 1 tablet (3 mg total) by mouth daily. 04/23/24 12/19/24  Kennedy Charmaine CROME, NP  polyethylene glycol powder (GLYCOLAX /MIRALAX ) 17 GM/SCOOP powder 1-4 scoop daily or as needed 12/17/21   Jayne Vonn DEL, MD  promethazine -dextromethorphan (PROMETHAZINE -DM) 6.25-15 MG/5ML syrup Take 5 mLs by mouth 4 (four) times daily as needed. 05/06/24   Bacchus, Meade PEDLAR, FNP  QUEtiapine  (SEROQUEL ) 100 MG tablet Take 1 tablet (100 mg total) by mouth 2 (two) times daily. 08/15/24   Okey Barnie SAUNDERS, MD   Vitamin D , Ergocalciferol , (DRISDOL ) 1.25 MG (50000 UNIT) CAPS capsule Take 1 capsule (50,000 Units total) by mouth every 7 (seven) days. 12/12/23   Bacchus, Meade PEDLAR, FNP    Allergies: Hydrocodone -acetaminophen , Lortab [hydrocodone -acetaminophen ], and Morphine and codeine    Review of Systems  Constitutional:  Negative for chills and fever.  HENT:  Negative for ear pain and sore throat.   Eyes:  Negative for pain and visual disturbance.  Respiratory:  Positive for cough. Negative for shortness of breath.   Cardiovascular:  Negative for chest pain and palpitations.  Gastrointestinal:  Positive for abdominal pain. Negative for vomiting.  Genitourinary:  Negative for dysuria and hematuria.  Musculoskeletal:  Negative for arthralgias and back pain.  Skin:  Negative for color change and rash.  Neurological:  Negative for seizures and syncope.  All other systems reviewed and are negative.   Updated Vital Signs BP 103/78 (BP Location: Right Arm)   Pulse (!) 121   Temp 99.1 F (37.3 C) (Oral)   Resp (!) 22   SpO2 94%   Physical Exam Vitals and nursing note reviewed.  Constitutional:      General: She is not in acute distress.    Appearance: She is well-developed. She is not ill-appearing.  HENT:     Head: Normocephalic and atraumatic.  Eyes:     Conjunctiva/sclera: Conjunctivae normal.  Cardiovascular:     Rate and Rhythm: Normal rate and regular rhythm.     Heart sounds: No murmur heard. Pulmonary:     Effort: Pulmonary effort is normal. No respiratory distress.     Breath sounds: Normal breath sounds.  Abdominal:     Palpations: Abdomen is soft.     Tenderness: There is abdominal tenderness in the epigastric area.  Musculoskeletal:        General: No swelling.     Cervical back: Neck supple.  Skin:    General: Skin is warm and dry.     Capillary Refill: Capillary refill takes less than 2 seconds.  Neurological:     Mental Status: She is alert.  Psychiatric:         Mood and Affect: Mood normal.     (all labs ordered are listed, but only abnormal results are displayed) Labs Reviewed  CBC - Abnormal; Notable for the following components:      Result Value   WBC 18.2 (*)    All other components within normal limits  LIPASE, BLOOD  COMPREHENSIVE METABOLIC PANEL WITH GFR  URINALYSIS, ROUTINE W REFLEX MICROSCOPIC  POC URINE PREG, ED    EKG: None  Radiology: DG Chest 1 View Result Date: 12/10/2024 EXAM: 1 VIEW(S) XRAY OF THE CHEST 12/10/2024 07:55:00 PM COMPARISON: 11/22/2024 CLINICAL HISTORY: Cough FINDINGS: LUNGS AND PLEURA: No focal pulmonary opacity. No pleural effusion. No pneumothorax. HEART AND MEDIASTINUM: No acute abnormality of the cardiac  and mediastinal silhouettes. BONES AND SOFT TISSUES: No acute osseous abnormality. IMPRESSION: 1. No acute process. Electronically signed by: Greig Pique MD 12/10/2024 08:03 PM EST RP Workstation: [id number]    {Document cardiac monitor, telemetry assessment procedure when appropriate:32947} Procedures   Medications Ordered in the ED  lactated ringers  bolus 1,000 mL (has no administration in time range)  ipratropium-albuterol  (DUONEB) 0.5-2.5 (3) MG/3ML nebulizer solution 3 mL (has no administration in time range)  HYDROcodone  bit-homatropine (HYCODAN) 5-1.5 MG/5ML syrup 5 mL (has no administration in time range)  ondansetron  (ZOFRAN ) injection 4 mg (has no administration in time range)      {Click here for ABCD2, HEART and other calculators REFRESH Note before signing:1}                              Medical Decision Making Amount and/or Complexity of Data Reviewed Labs: ordered. Radiology: ordered.  Risk Prescription drug management.   ***  {Document critical care time when appropriate  Document review of labs and clinical decision tools ie CHADS2VASC2, etc  Document your independent review of radiology images and any outside records  Document your discussion with family members,  caretakers and with consultants  Document social determinants of health affecting pt's care  Document your decision making why or why not admission, treatments were needed:32947:::1}   Final diagnoses:  None    ED Discharge Orders     None        "

## 2024-12-11 ENCOUNTER — Encounter (HOSPITAL_COMMUNITY): Payer: Self-pay

## 2024-12-11 ENCOUNTER — Other Ambulatory Visit (HOSPITAL_COMMUNITY): Payer: Self-pay | Admitting: Psychiatry

## 2024-12-11 MED ORDER — AMPHETAMINE-DEXTROAMPHETAMINE 20 MG PO TABS: 20.0000 mg | ORAL_TABLET | Freq: Two times a day (BID) | ORAL | 0 refills | Status: DC

## 2024-12-11 MED ORDER — FLUOXETINE HCL 40 MG PO CAPS: 40.0000 mg | ORAL_CAPSULE | Freq: Every day | ORAL | 2 refills | Status: DC

## 2024-12-11 NOTE — Telephone Encounter (Signed)
 Refills sent.Call for appt

## 2024-12-12 ENCOUNTER — Emergency Department (HOSPITAL_COMMUNITY)

## 2024-12-12 ENCOUNTER — Ambulatory Visit: Payer: Self-pay | Admitting: Gastroenterology

## 2024-12-12 ENCOUNTER — Inpatient Hospital Stay (HOSPITAL_COMMUNITY)
Admission: EM | Admit: 2024-12-12 | Discharge: 2024-12-21 | DRG: 392 | Disposition: A | Discharge status: Home / Self Care | Attending: Internal Medicine | Admitting: Internal Medicine

## 2024-12-12 ENCOUNTER — Encounter (HOSPITAL_COMMUNITY): Payer: Self-pay

## 2024-12-12 ENCOUNTER — Other Ambulatory Visit: Payer: Self-pay

## 2024-12-12 DIAGNOSIS — R1011 Right upper quadrant pain: Secondary | ICD-10-CM | POA: Diagnosis not present

## 2024-12-12 DIAGNOSIS — K259 Gastric ulcer, unspecified as acute or chronic, without hemorrhage or perforation: Secondary | ICD-10-CM | POA: Diagnosis not present

## 2024-12-12 DIAGNOSIS — N390 Urinary tract infection, site not specified: Secondary | ICD-10-CM

## 2024-12-12 DIAGNOSIS — F419 Anxiety disorder, unspecified: Secondary | ICD-10-CM | POA: Diagnosis present

## 2024-12-12 DIAGNOSIS — R110 Nausea: Secondary | ICD-10-CM | POA: Diagnosis not present

## 2024-12-12 DIAGNOSIS — Z8711 Personal history of peptic ulcer disease: Secondary | ICD-10-CM | POA: Diagnosis not present

## 2024-12-12 DIAGNOSIS — Z905 Acquired absence of kidney: Secondary | ICD-10-CM

## 2024-12-12 DIAGNOSIS — Z825 Family history of asthma and other chronic lower respiratory diseases: Secondary | ICD-10-CM

## 2024-12-12 DIAGNOSIS — K649 Unspecified hemorrhoids: Secondary | ICD-10-CM | POA: Diagnosis present

## 2024-12-12 DIAGNOSIS — R109 Unspecified abdominal pain: Secondary | ICD-10-CM | POA: Diagnosis present

## 2024-12-12 DIAGNOSIS — R7689 Other specified abnormal immunological findings in serum: Secondary | ICD-10-CM | POA: Diagnosis present

## 2024-12-12 DIAGNOSIS — Z818 Family history of other mental and behavioral disorders: Secondary | ICD-10-CM

## 2024-12-12 DIAGNOSIS — K760 Fatty (change of) liver, not elsewhere classified: Secondary | ICD-10-CM | POA: Diagnosis present

## 2024-12-12 DIAGNOSIS — K589 Irritable bowel syndrome without diarrhea: Secondary | ICD-10-CM | POA: Diagnosis present

## 2024-12-12 DIAGNOSIS — Z9049 Acquired absence of other specified parts of digestive tract: Secondary | ICD-10-CM | POA: Diagnosis not present

## 2024-12-12 DIAGNOSIS — K5904 Chronic idiopathic constipation: Secondary | ICD-10-CM | POA: Diagnosis not present

## 2024-12-12 DIAGNOSIS — D649 Anemia, unspecified: Secondary | ICD-10-CM | POA: Diagnosis present

## 2024-12-12 DIAGNOSIS — K2980 Duodenitis without bleeding: Principal | ICD-10-CM | POA: Diagnosis present

## 2024-12-12 DIAGNOSIS — R748 Abnormal levels of other serum enzymes: Secondary | ICD-10-CM | POA: Diagnosis not present

## 2024-12-12 DIAGNOSIS — Z79899 Other long term (current) drug therapy: Secondary | ICD-10-CM | POA: Diagnosis not present

## 2024-12-12 DIAGNOSIS — Z791 Long term (current) use of non-steroidal anti-inflammatories (NSAID): Secondary | ICD-10-CM

## 2024-12-12 DIAGNOSIS — I10 Essential (primary) hypertension: Secondary | ICD-10-CM | POA: Diagnosis present

## 2024-12-12 DIAGNOSIS — R1010 Upper abdominal pain, unspecified: Secondary | ICD-10-CM | POA: Diagnosis not present

## 2024-12-12 DIAGNOSIS — R7401 Elevation of levels of liver transaminase levels: Principal | ICD-10-CM | POA: Diagnosis present

## 2024-12-12 DIAGNOSIS — R319 Hematuria, unspecified: Secondary | ICD-10-CM | POA: Diagnosis present

## 2024-12-12 DIAGNOSIS — K449 Diaphragmatic hernia without obstruction or gangrene: Secondary | ICD-10-CM | POA: Diagnosis present

## 2024-12-12 DIAGNOSIS — Z8249 Family history of ischemic heart disease and other diseases of the circulatory system: Secondary | ICD-10-CM

## 2024-12-12 DIAGNOSIS — Z6829 Body mass index (BMI) 29.0-29.9, adult: Secondary | ICD-10-CM

## 2024-12-12 DIAGNOSIS — Z885 Allergy status to narcotic agent status: Secondary | ICD-10-CM

## 2024-12-12 DIAGNOSIS — K219 Gastro-esophageal reflux disease without esophagitis: Secondary | ICD-10-CM | POA: Diagnosis present

## 2024-12-12 DIAGNOSIS — F319 Bipolar disorder, unspecified: Secondary | ICD-10-CM | POA: Diagnosis present

## 2024-12-12 DIAGNOSIS — K7589 Other specified inflammatory liver diseases: Secondary | ICD-10-CM | POA: Diagnosis present

## 2024-12-12 DIAGNOSIS — R7989 Other specified abnormal findings of blood chemistry: Secondary | ICD-10-CM | POA: Diagnosis not present

## 2024-12-12 DIAGNOSIS — K2950 Unspecified chronic gastritis without bleeding: Secondary | ICD-10-CM | POA: Diagnosis present

## 2024-12-12 DIAGNOSIS — Z8261 Family history of arthritis: Secondary | ICD-10-CM

## 2024-12-12 DIAGNOSIS — K5909 Other constipation: Secondary | ICD-10-CM | POA: Diagnosis present

## 2024-12-12 DIAGNOSIS — K3189 Other diseases of stomach and duodenum: Secondary | ICD-10-CM | POA: Diagnosis present

## 2024-12-12 DIAGNOSIS — K625 Hemorrhage of anus and rectum: Secondary | ICD-10-CM | POA: Diagnosis not present

## 2024-12-12 DIAGNOSIS — F418 Other specified anxiety disorders: Secondary | ICD-10-CM | POA: Diagnosis not present

## 2024-12-12 DIAGNOSIS — M542 Cervicalgia: Secondary | ICD-10-CM | POA: Diagnosis not present

## 2024-12-12 DIAGNOSIS — E669 Obesity, unspecified: Secondary | ICD-10-CM | POA: Diagnosis present

## 2024-12-12 DIAGNOSIS — M8580 Other specified disorders of bone density and structure, unspecified site: Secondary | ICD-10-CM | POA: Diagnosis present

## 2024-12-12 DIAGNOSIS — K5900 Constipation, unspecified: Secondary | ICD-10-CM | POA: Diagnosis present

## 2024-12-12 DIAGNOSIS — F909 Attention-deficit hyperactivity disorder, unspecified type: Secondary | ICD-10-CM | POA: Diagnosis present

## 2024-12-12 DIAGNOSIS — Z808 Family history of malignant neoplasm of other organs or systems: Secondary | ICD-10-CM

## 2024-12-12 DIAGNOSIS — Z8041 Family history of malignant neoplasm of ovary: Secondary | ICD-10-CM

## 2024-12-12 DIAGNOSIS — G8929 Other chronic pain: Secondary | ICD-10-CM | POA: Diagnosis present

## 2024-12-12 DIAGNOSIS — R1013 Epigastric pain: Secondary | ICD-10-CM

## 2024-12-12 LAB — COMPREHENSIVE METABOLIC PANEL WITH GFR
ALT: 393 U/L — ABNORMAL HIGH (ref 0–44)
AST: 495 U/L — ABNORMAL HIGH (ref 15–41)
Albumin: 4.6 g/dL (ref 3.5–5.0)
Alkaline Phosphatase: 206 U/L — ABNORMAL HIGH (ref 38–126)
Anion gap: 17 — ABNORMAL HIGH (ref 5–15)
BUN: 6 mg/dL (ref 6–20)
CO2: 21 mmol/L — ABNORMAL LOW (ref 22–32)
Calcium: 9.2 mg/dL (ref 8.9–10.3)
Chloride: 99 mmol/L (ref 98–111)
Creatinine, Ser: 0.88 mg/dL (ref 0.44–1.00)
GFR, Estimated: >60 mL/min
Glucose, Bld: 73 mg/dL (ref 70–99)
Potassium: 4 mmol/L (ref 3.5–5.1)
Sodium: 136 mmol/L (ref 135–145)
Total Bilirubin: 0.4 mg/dL (ref 0.0–1.2)
Total Protein: 7.2 g/dL (ref 6.5–8.1)

## 2024-12-12 LAB — URINALYSIS, W/ REFLEX TO CULTURE (INFECTION SUSPECTED)
Bilirubin Urine: NEGATIVE
Glucose, UA: NEGATIVE mg/dL
Hgb urine dipstick: NEGATIVE
Ketones, ur: NEGATIVE mg/dL
Leukocytes,Ua: NEGATIVE
Nitrite: NEGATIVE
Protein, ur: NEGATIVE mg/dL
Specific Gravity, Urine: 1.005 (ref 1.005–1.030)
pH: 6 (ref 5.0–8.0)

## 2024-12-12 LAB — CBC WITH DIFFERENTIAL/PLATELET
Abs Immature Granulocytes: 0.03 K/uL (ref 0.00–0.07)
Basophils Absolute: 0.1 K/uL (ref 0.0–0.1)
Basophils Relative: 1 %
Eosinophils Absolute: 0.3 K/uL (ref 0.0–0.5)
Eosinophils Relative: 3 %
HCT: 37.3 % (ref 36.0–46.0)
Hemoglobin: 12.5 g/dL (ref 12.0–15.0)
Immature Granulocytes: 0 %
Lymphocytes Relative: 19 %
Lymphs Abs: 2.1 K/uL (ref 0.7–4.0)
MCH: 32.4 pg (ref 26.0–34.0)
MCHC: 33.5 g/dL (ref 30.0–36.0)
MCV: 96.6 fL (ref 80.0–100.0)
Monocytes Absolute: 1.1 K/uL — ABNORMAL HIGH (ref 0.1–1.0)
Monocytes Relative: 10 %
Neutro Abs: 7.4 K/uL (ref 1.7–7.7)
Neutrophils Relative %: 67 %
Platelets: 272 K/uL (ref 150–400)
RBC: 3.86 MIL/uL — ABNORMAL LOW (ref 3.87–5.11)
RDW: 11.9 % (ref 11.5–15.5)
WBC: 10.9 K/uL — ABNORMAL HIGH (ref 4.0–10.5)
nRBC: 0 % (ref 0.0–0.2)

## 2024-12-12 LAB — PROTIME-INR
INR: 0.9 (ref 0.8–1.2)
Prothrombin Time: 12.7 s (ref 11.4–15.2)

## 2024-12-12 LAB — POC URINE PREG, ED: Preg Test, Ur: NEGATIVE

## 2024-12-12 LAB — LACTIC ACID, PLASMA: Lactic Acid, Venous: 1.7 mmol/L (ref 0.5–1.9)

## 2024-12-12 MED ORDER — ONDANSETRON HCL 4 MG/2ML IJ SOLN
4.0000 mg | Freq: Four times a day (QID) | INTRAMUSCULAR | Status: DC | PRN
  Administered 2024-12-13 – 2024-12-19 (×6): 4 mg via INTRAVENOUS
  Filled 2024-12-12 (×6): qty 2

## 2024-12-12 MED ORDER — KETOROLAC TROMETHAMINE 30 MG/ML IJ SOLN
30.0000 mg | Freq: Once | INTRAMUSCULAR | Status: AC
  Administered 2024-12-12: 30 mg via INTRAVENOUS
  Filled 2024-12-12: qty 1

## 2024-12-12 MED ORDER — ENOXAPARIN SODIUM 40 MG/0.4ML IJ SOSY: 40.0000 mg | PREFILLED_SYRINGE | INTRAMUSCULAR | Status: DC

## 2024-12-12 MED ORDER — SODIUM CHLORIDE 0.9 % IV SOLN
2.0000 g | Freq: Once | INTRAVENOUS | Status: AC
  Administered 2024-12-12: 2 g via INTRAVENOUS
  Filled 2024-12-12: qty 20

## 2024-12-12 MED ORDER — ONDANSETRON HCL 4 MG/2ML IJ SOLN
4.0000 mg | Freq: Once | INTRAMUSCULAR | Status: AC
  Administered 2024-12-12: 4 mg via INTRAVENOUS
  Filled 2024-12-12: qty 2

## 2024-12-12 MED ORDER — ONDANSETRON HCL 4 MG PO TABS
4.0000 mg | ORAL_TABLET | Freq: Four times a day (QID) | ORAL | Status: DC | PRN
  Administered 2024-12-14 – 2024-12-20 (×4): 4 mg via ORAL
  Filled 2024-12-12 (×4): qty 1

## 2024-12-12 MED ORDER — SODIUM CHLORIDE 0.9 % IV BOLUS
1000.0000 mL | Freq: Once | INTRAVENOUS | Status: AC
  Administered 2024-12-12: 1000 mL via INTRAVENOUS

## 2024-12-12 NOTE — ED Notes (Addendum)
 Patient transported to CT

## 2024-12-12 NOTE — H&P (Addendum)
 " History and Physical    Patient: Sandra Glover FMW:[id number] DOB: 12/09/1985 DOA: 12/12/2024 DOS: the patient was seen and examined on 12/12/2024 PCP: [name], FNP  Patient coming from: Home  Chief Complaint:  Chief Complaint  Patient presents with   Hematuria   Fever   HPI: Sandra Glover is a 39 y.o. female with medical history significant of hypertension, anxiety, IBS, GERD, chronic abdominal pain (undergoing outpatient workup with GI) who presents to the emergency department due to blood in urine, right lower abdominal pain, nausea, weakness which started today.    She was seen in the ED on 1/ 6/26 due to cough and abdominal pain due to worsening abdominal pain secondary to frequent cough, she has been having upper respiratory tract symptoms including runny nose and some shortness of breath.  She was treated at that time with IV hydration and Hycodan and was discharged home.  ED course In the emergency department, she was hemodynamically stable.  Workup in the ED showed normal CBC except for WBC of 10.9, BMP was normal except for bicarb of 21, AST 495, ALT 393, ALP 206.  Urinalysis was normal, lactic acid was normal CT abdomen and pelvis showed no acute findings in the abdomen or pelvis EKG personally reviewed shows sinus tachycardia at a rate of 124 bpm She was treated with IV ceftriaxone , pain medication, Zofran  and IV hydration was provided. Gastroenterologist (Dr. Cinderella) was consulted due to elevated liver enzymes, some recommendations were given. [facility] was asked to admit patient    Review of Systems: As mentioned in the history of present illness. All other systems reviewed and are negative. Past Medical History:  Diagnosis Date   Anxiety    Anxiety    Arthritis    Depression    History of anemia 2013   History of depression    states stopped med. 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   Hypertension     Osteopenia    Pertussis    Postcoital bleeding 07/14/2015   Sinus infection 01/25/2013   started antibiotic 01/24/2013 x 10 days; nasal congestion, clear drainage from nose   Tonsillar and adenoid hypertrophy 01/2013   Vaginal irritation 03/25/2014   Mild yeast but has history of yeast, will not treat til after first trimester,try yogurt   Past Surgical History:  Procedure Laterality Date   CHOLECYSTECTOMY  04/23/2010   laparoscopic   COLONOSCOPY WITH PROPOFOL  N/A 05/17/2016   Procedure: COLONOSCOPY WITH PROPOFOL ;  Surgeon: Margo LITTIE Haddock, MD;  Location: [facility];  Service: Endoscopy;  Laterality: N/A;  1000   HEMORRHOID BANDING N/A 05/17/2016   Procedure: HEMORRHOID BANDING;  Surgeon: Margo LITTIE Haddock, MD;  Location: [facility];  Service: Endoscopy;  Laterality: N/A;   HEMORRHOID SURGERY N/A 06/08/2016   Procedure: SIMPLE HEMORRHOIDECTOMY;  Surgeon: Selinda Artist Moats, MD;  Location: [facility];  Service: General;  Laterality: N/A;   PARTIAL NEPHRECTOMY Right age 5 mos.   TONSILLECTOMY AND ADENOIDECTOMY N/A 01/29/2013   Procedure: TONSILLECTOMY AND ADENOIDECTOMY;  Surgeon: Ana LELON Moccasin, MD;  Location: [facility];  Service: ENT;  Laterality: N/A;   Social History:  reports that she has never smoked. She has never been exposed to tobacco smoke. She has never used smokeless tobacco. She reports current alcohol use. She reports that she does not use drugs.  Allergies[1]  Family History  Problem Relation Age of Onset  COPD Mother    Depression Mother    Anxiety disorder Mother    Hypertension Mother    Irritable bowel syndrome Mother    Arthritis Mother    Skin cancer Mother    Thyroid  cancer Mother    Ovarian cancer Maternal Aunt    Ovarian cancer Maternal Grandmother    Cancer Maternal Grandmother        bladder, kidney   Hypertension Father    GER disease Father    Bipolar disorder Paternal Uncle    Anxiety disorder Cousin    Drug abuse Cousin    Bipolar disorder  Cousin    Colon cancer Neg Hx     Prior to Admission medications  Medication Sig Start Date End Date Taking? Authorizing Provider  ALPRAZolam  (XANAX ) 1 MG tablet Take 1 tablet (1 mg total) by mouth 2 (two) times daily as needed for anxiety. 08/15/24 08/15/25  Okey Barnie SAUNDERS, MD  amphetamine -dextroamphetamine  (ADDERALL) 20 MG tablet Take 1 tablet (20 mg total) by mouth 2 (two) times daily. 12/11/24 12/11/25  Okey Barnie SAUNDERS, MD  benzonatate  (TESSALON ) 100 MG capsule Take 1 capsule (100 mg total) by mouth 3 (three) times daily as needed for cough. Do not take with alcohol or while operating or driving heavy machinery 06/09/73   Chandra Harlene LABOR, NP  calcium-vitamin D  (OSCAL WITH D) 500-200 MG-UNIT tablet Take 1 tablet by mouth.    [provider]  Drospirenone -Estetrol  3-14.2 MG TABS Take 1 tablet by mouth daily. 12/22/21   Jayne Vonn DEL, MD  etonogestrel  (NEXPLANON ) 68 MG IMPL implant 1 each by Subdermal route once.    [provider]  FLUoxetine  (PROZAC ) 40 MG capsule Take 1 capsule (40 mg total) by mouth daily. 12/11/24   Okey Barnie SAUNDERS, MD  gabapentin  (NEURONTIN ) 100 MG capsule Take 1 capsule (100 mg total) by mouth 3 (three) times daily. 07/25/23   Margrette Taft BRAVO, MD  HYDROcodone  bit-homatropine [facility]) 5-1.5 MG/5ML syrup Take 5 mLs by mouth every 6 (six) hours as needed for up to 4 days for cough. 12/10/24 12/14/24  Kammerer, Megan L, DO  linaclotide  (LINZESS ) 290 MCG CAPS capsule Take 1 capsule (290 mcg total) by mouth daily before breakfast. 11/13/24   Kennedy Charmaine CROME, NP  lisinopril -hydrochlorothiazide  (ZESTORETIC ) 20-12.5 MG tablet TAKE 1 TABLET BY MOUTH DAILY 08/08/24   Bacchus, Gloria Z, FNP  lubiprostone  (AMITIZA ) 8 MCG capsule TAKE 1 CAPSULE(8 MCG) BY MOUTH TWICE DAILY WITH A MEAL 09/23/24   Kennedy Charmaine CROME, NP  meloxicam (MOBIC) 7.5 MG tablet Take 7.5 mg by mouth daily as needed for pain. 08/15/19   [provider]  methocarbamol  (ROBAXIN ) 500 MG tablet Take  1 tablet (500 mg total) by mouth every 6 (six) hours as needed for up to 7 days for muscle spasms (pain). 12/10/24 12/17/24  Kammerer, Megan L, DO  metoprolol  tartrate (LOPRESSOR ) 100 MG tablet Take 1 tablet (100 mg total) by mouth once for 1 dose. Take 90-120 minutes prior to scan. Hold for SBP less than 110. 08/20/24 08/21/24  Floretta Mallard, MD  ondansetron  (ZOFRAN ) 4 MG tablet Take 1 tablet (4 mg total) by mouth every 6 (six) hours. 12/02/24   Kennedy Charmaine CROME, NP  oxyCODONE  (ROXICODONE ) 5 MG immediate release tablet Take 1 tablet (5 mg total) by mouth every 6 (six) hours as needed for up to 10 doses for severe pain (pain score 7-10). 11/22/24   Young, Thersia RAMAN, PA-C  pantoprazole  (PROTONIX ) 40 MG tablet TAKE 1  TABLET(40 MG) BY MOUTH TWICE DAILY 12/02/24   Bacchus, Gloria Z, FNP  Plecanatide  (TRULANCE ) 3 MG TABS Take 1 tablet (3 mg total) by mouth daily. 04/23/24 12/19/24  Kennedy Charmaine CROME, NP  polyethylene glycol powder (GLYCOLAX /MIRALAX ) 17 GM/SCOOP powder 1-4 scoop daily or as needed 12/17/21   Jayne Vonn DEL, MD  promethazine -dextromethorphan (PROMETHAZINE -DM) 6.25-15 MG/5ML syrup Take 5 mLs by mouth 4 (four) times daily as needed. 05/06/24   Bacchus, Meade PEDLAR, FNP  QUEtiapine  (SEROQUEL ) 100 MG tablet Take 1 tablet (100 mg total) by mouth 2 (two) times daily. 08/15/24   Okey Barnie SAUNDERS, MD  Vitamin D , Ergocalciferol , (DRISDOL ) 1.25 MG (50000 UNIT) CAPS capsule Take 1 capsule (50,000 Units total) by mouth every 7 (seven) days. 12/12/23   [name], FNP    Physical Exam: Vitals:   12/12/24 2130 12/12/24 2145 12/12/24 2200 12/12/24 2230  BP: 104/73 107/69 107/72 117/79  Pulse: 89 82 86 95  Resp: 15 13 (!) 22 14  Temp:      TempSrc:      SpO2: 98% 98% 98% 98%  Weight:      Height:       General: Awake and alert and oriented x3. Not in any acute distress.  HEENT: NCAT.  PERRLA. EOMI. Sclerae anicteric.  Moist mucosal membranes. Neck: Neck supple without lymphadenopathy. No carotid bruits.  No masses palpated.  Cardiovascular: Regular rate with normal S1-S2 sounds. No murmurs, rubs or gallops auscultated. No JVD.  Respiratory: Clear breath sounds.  No accessory muscle use. Abdomen: Soft, tender to palpation of RUQ without guarding., nondistended. Active bowel sounds. No masses or hepatosplenomegaly  Skin: No rashes, lesions, or ulcerations.  Dry, warm to touch. Musculoskeletal:  2+ dorsalis pedis and radial pulses. Good ROM.  No contractures  Psychiatric: Intact judgment and insight.  Mood appropriate to current condition. Neurologic: No focal neurological deficits. Strength is 5/5 x 4.  CN II - XII grossly intact.  Assessment and Plan: UTI POA Patient was started on ceftriaxone  Urine culture pending  Abdominal pain, nausea Continue IV Dilaudid  0.5 mg every 4 hours as needed for moderate/severe pain Continue Zofran  as needed Patient undergoing workup with GI  Transaminitis AST 495, ALT 393, ALP 206 Hepatitis panel pending Liver biopsy by IR in the morning RUQ ultrasound in the morning Gastroenterologist (Dr. Cinderella) was already consulted and will see patient in the morning per EDP.  Essential hypertension BP meds will be held at this time due to soft BP  GERD Continue Protonix     Advance Care Planning: Full code  Consults: Gastroenterology  Family Communication: None at bedside  Severity of Illness: The appropriate patient status for this patient is INPATIENT. Inpatient status is judged to be reasonable and necessary in order to provide the required intensity of service to ensure the patient's safety. The patient's presenting symptoms, physical exam findings, and initial radiographic and laboratory data in the context of their chronic comorbidities is felt to place them at high risk for further clinical deterioration. Furthermore, it is not anticipated that the patient will be medically stable for discharge from the hospital within 2 midnights of admission.   *  I certify that at the point of admission it is my clinical judgment that the patient will require inpatient hospital care spanning beyond 2 midnights from the point of admission due to high intensity of service, high risk for further deterioration and high frequency of surveillance required.*  Author: Dayzha Pogosyan, DO 12/12/2024 11:25 PM  For  on call review [email or web address].      [1]  Allergies Allergen Reactions   Hydrocodone -Acetaminophen  Nausea Only   Lortab [Hydrocodone -Acetaminophen ] Nausea And Vomiting   Morphine And Codeine Rash   "

## 2024-12-12 NOTE — ED Triage Notes (Signed)
 Pt states that she was seen here earlier this week for kidney issues. Pt states that she started having hematuria today. Pt reports L sided muscle spasms since this am. LKN 1000 today.

## 2024-12-13 ENCOUNTER — Inpatient Hospital Stay (HOSPITAL_COMMUNITY)

## 2024-12-13 ENCOUNTER — Ambulatory Visit (HOSPITAL_COMMUNITY): Attending: Urology

## 2024-12-13 DIAGNOSIS — K5904 Chronic idiopathic constipation: Secondary | ICD-10-CM | POA: Diagnosis not present

## 2024-12-13 DIAGNOSIS — K625 Hemorrhage of anus and rectum: Secondary | ICD-10-CM

## 2024-12-13 DIAGNOSIS — R109 Unspecified abdominal pain: Secondary | ICD-10-CM | POA: Diagnosis not present

## 2024-12-13 DIAGNOSIS — K760 Fatty (change of) liver, not elsewhere classified: Secondary | ICD-10-CM | POA: Insufficient documentation

## 2024-12-13 DIAGNOSIS — R748 Abnormal levels of other serum enzymes: Secondary | ICD-10-CM | POA: Diagnosis not present

## 2024-12-13 DIAGNOSIS — R7989 Other specified abnormal findings of blood chemistry: Secondary | ICD-10-CM | POA: Insufficient documentation

## 2024-12-13 DIAGNOSIS — R7401 Elevation of levels of liver transaminase levels: Secondary | ICD-10-CM | POA: Diagnosis not present

## 2024-12-13 LAB — VITAMIN B12: Vitamin B-12: 726 pg/mL (ref 180–914)

## 2024-12-13 LAB — CBC
HCT: 32.9 % — ABNORMAL LOW (ref 36.0–46.0)
Hemoglobin: 11.1 g/dL — ABNORMAL LOW (ref 12.0–15.0)
MCH: 32.8 pg (ref 26.0–34.0)
MCHC: 33.7 g/dL (ref 30.0–36.0)
MCV: 97.3 fL (ref 80.0–100.0)
Platelets: 231 K/uL (ref 150–400)
RBC: 3.38 MIL/uL — ABNORMAL LOW (ref 3.87–5.11)
RDW: 12 % (ref 11.5–15.5)
WBC: 8.2 K/uL (ref 4.0–10.5)
nRBC: 0 % (ref 0.0–0.2)

## 2024-12-13 LAB — COMPREHENSIVE METABOLIC PANEL WITH GFR
ALT: 289 U/L — ABNORMAL HIGH (ref 0–44)
AST: 232 U/L — ABNORMAL HIGH (ref 15–41)
Albumin: 3.9 g/dL (ref 3.5–5.0)
Alkaline Phosphatase: 173 U/L — ABNORMAL HIGH (ref 38–126)
Anion gap: 8 (ref 5–15)
BUN: 7 mg/dL (ref 6–20)
CO2: 27 mmol/L (ref 22–32)
Calcium: 8.1 mg/dL — ABNORMAL LOW (ref 8.9–10.3)
Chloride: 103 mmol/L (ref 98–111)
Creatinine, Ser: 0.86 mg/dL (ref 0.44–1.00)
GFR, Estimated: >60 mL/min
Glucose, Bld: 93 mg/dL (ref 70–99)
Potassium: 4.1 mmol/L (ref 3.5–5.1)
Sodium: 138 mmol/L (ref 135–145)
Total Bilirubin: 0.5 mg/dL (ref 0.0–1.2)
Total Protein: 5.8 g/dL — ABNORMAL LOW (ref 6.5–8.1)

## 2024-12-13 LAB — MAGNESIUM: Magnesium: 2.1 mg/dL (ref 1.7–2.4)

## 2024-12-13 LAB — HIV ANTIBODY (ROUTINE TESTING W REFLEX): HIV Screen 4th Generation wRfx: NONREACTIVE

## 2024-12-13 LAB — HEPATITIS PANEL, ACUTE
HCV Ab: NONREACTIVE
Hep A IgM: NONREACTIVE
Hep B C IgM: NONREACTIVE
Hepatitis B Surface Ag: NONREACTIVE

## 2024-12-13 LAB — PROTIME-INR
INR: 1 (ref 0.8–1.2)
Prothrombin Time: 13.3 s (ref 11.4–15.2)

## 2024-12-13 LAB — PHOSPHORUS: Phosphorus: 3.4 mg/dL (ref 2.5–4.6)

## 2024-12-13 LAB — FOLATE: Folate: 6.3 ng/mL

## 2024-12-13 MED ORDER — GABAPENTIN 100 MG PO CAPS: 100.0000 mg | ORAL_CAPSULE | Freq: Three times a day (TID) | ORAL | Status: DC

## 2024-12-13 MED ORDER — FENTANYL CITRATE (PF) 100 MCG/2ML IJ SOLN
INTRAMUSCULAR | Status: AC | PRN
  Administered 2024-12-13 (×2): 50 ug via INTRAVENOUS

## 2024-12-13 MED ORDER — POLYETHYLENE GLYCOL 3350 17 G PO PACK
17.0000 g | PACK | Freq: Two times a day (BID) | ORAL | Status: DC
  Administered 2024-12-13 – 2024-12-17 (×8): 17 g via ORAL
  Filled 2024-12-13 (×8): qty 1

## 2024-12-13 MED ORDER — FENTANYL CITRATE (PF) 100 MCG/2ML IJ SOLN
INTRAMUSCULAR | Status: AC
  Filled 2024-12-13: qty 4

## 2024-12-13 MED ORDER — KETOROLAC TROMETHAMINE 15 MG/ML IJ SOLN
15.0000 mg | Freq: Four times a day (QID) | INTRAMUSCULAR | Status: DC | PRN
  Administered 2024-12-13: 15 mg via INTRAVENOUS
  Filled 2024-12-13: qty 1

## 2024-12-13 MED ORDER — PANTOPRAZOLE SODIUM 40 MG PO TBEC
40.0000 mg | DELAYED_RELEASE_TABLET | Freq: Every day | ORAL | Status: DC
  Administered 2024-12-14 – 2024-12-16 (×3): 40 mg via ORAL
  Filled 2024-12-13 (×4): qty 1

## 2024-12-13 MED ORDER — SODIUM CHLORIDE 0.9 % IV SOLN: 1.0000 g | INTRAVENOUS | Status: DC

## 2024-12-13 MED ORDER — METHOCARBAMOL 500 MG PO TABS
500.0000 mg | ORAL_TABLET | Freq: Four times a day (QID) | ORAL | Status: DC | PRN
  Administered 2024-12-13 – 2024-12-20 (×4): 500 mg via ORAL
  Filled 2024-12-13 (×5): qty 1

## 2024-12-13 MED ORDER — LINACLOTIDE 145 MCG PO CAPS
290.0000 ug | ORAL_CAPSULE | Freq: Every day | ORAL | Status: DC
  Administered 2024-12-14 – 2024-12-21 (×8): 290 ug via ORAL
  Filled 2024-12-13 (×8): qty 2

## 2024-12-13 MED ORDER — MIDAZOLAM HCL (PF) 2 MG/2ML IJ SOLN
INTRAMUSCULAR | Status: AC | PRN
  Administered 2024-12-13 (×3): 1 mg via INTRAVENOUS

## 2024-12-13 MED ORDER — AMPHETAMINE-DEXTROAMPHETAMINE 10 MG PO TABS
20.0000 mg | ORAL_TABLET | Freq: Two times a day (BID) | ORAL | Status: DC
  Administered 2024-12-13 – 2024-12-21 (×13): 20 mg via ORAL
  Filled 2024-12-13 (×15): qty 2

## 2024-12-13 MED ORDER — BENZONATATE 100 MG PO CAPS: 100.0000 mg | ORAL_CAPSULE | Freq: Three times a day (TID) | ORAL | Status: DC | PRN

## 2024-12-13 MED ORDER — METOPROLOL TARTRATE 50 MG PO TABS: 100.0000 mg | ORAL_TABLET | Freq: Once | ORAL | Status: DC

## 2024-12-13 MED ORDER — LIDOCAINE HCL (PF) 1 % IJ SOLN
10.0000 mL | Freq: Once | INTRAMUSCULAR | Status: AC
  Administered 2024-12-13: 10 mL via INTRADERMAL

## 2024-12-13 MED ORDER — HYDROMORPHONE HCL 1 MG/ML IJ SOLN
0.5000 mg | INTRAMUSCULAR | Status: DC | PRN
  Administered 2024-12-13 – 2024-12-21 (×29): 0.5 mg via INTRAVENOUS
  Filled 2024-12-13 (×30): qty 0.5

## 2024-12-13 MED ORDER — ALPRAZOLAM 1 MG PO TABS
1.0000 mg | ORAL_TABLET | Freq: Two times a day (BID) | ORAL | Status: DC | PRN
  Administered 2024-12-13 – 2024-12-20 (×8): 1 mg via ORAL
  Filled 2024-12-13 (×8): qty 1

## 2024-12-13 MED ORDER — OXYCODONE HCL 5 MG PO TABS
5.0000 mg | ORAL_TABLET | Freq: Four times a day (QID) | ORAL | Status: DC | PRN
  Administered 2024-12-13 – 2024-12-20 (×15): 5 mg via ORAL
  Filled 2024-12-13 (×15): qty 1

## 2024-12-13 MED ORDER — FLUOXETINE HCL 20 MG PO CAPS
40.0000 mg | ORAL_CAPSULE | Freq: Every day | ORAL | Status: DC
  Administered 2024-12-14 – 2024-12-21 (×8): 40 mg via ORAL
  Filled 2024-12-13 (×2): qty 2
  Filled 2024-12-13: qty 4
  Filled 2024-12-13 (×5): qty 2

## 2024-12-13 MED ORDER — QUETIAPINE FUMARATE 100 MG PO TABS: 100.0000 mg | ORAL_TABLET | Freq: Two times a day (BID) | ORAL | Status: DC

## 2024-12-13 MED ORDER — DOCUSATE SODIUM 100 MG PO CAPS
100.0000 mg | ORAL_CAPSULE | Freq: Every day | ORAL | Status: DC
  Administered 2024-12-13 – 2024-12-21 (×9): 100 mg via ORAL
  Filled 2024-12-13 (×9): qty 1

## 2024-12-13 MED ORDER — MIDAZOLAM HCL 2 MG/2ML IJ SOLN
INTRAMUSCULAR | Status: AC
  Filled 2024-12-13: qty 4

## 2024-12-13 MED ORDER — PANTOPRAZOLE SODIUM 40 MG IV SOLR
40.0000 mg | Freq: Two times a day (BID) | INTRAVENOUS | Status: DC
  Administered 2024-12-13: 40 mg via INTRAVENOUS
  Filled 2024-12-13: qty 10

## 2024-12-13 MED ORDER — PLECANATIDE 3 MG PO TABS: 1.0000 | ORAL_TABLET | Freq: Every day | ORAL | Status: DC

## 2024-12-13 NOTE — Procedures (Signed)
 Interventional Radiology Procedure Note  Procedure: Ultrasound guided liver biopsy   Findings: Please refer to procedural dictation for full description.18 ga core x2 from right lobe.  Gelfoam slurry needle track embolization.  Complications: None immediate  Estimated Blood Loss: < 5 mL  Recommendations: Strict 3 hour bedrest. Follow Pathology results.   Ester Sides, MD

## 2024-12-13 NOTE — ED Notes (Signed)
 [facility] has been called and notified that pt needs to be transported to [facility] by 12.

## 2024-12-13 NOTE — TOC CM/SW Note (Signed)
 Transition of Care [facility]) - Inpatient Brief Assessment   Patient Details  Name: Sandra Glover MRN: [id number] Date of Birth: 1986/02/01  Transition of Care [facility]) CM/SW Contact:    Lucie Lunger, LCSWA Phone Number: 12/13/2024, 8:21 AM   Clinical Narrative: Transition of Care Department [facility]) has reviewed patient and no TOC needs have been identified at this time. We will continue to monitor patient advancement through interdiciplinary progression rounds. If new patient transition needs arise, please place a TOC consult.  Transition of Care Asessment: Insurance and Status: Insurance coverage has been reviewed Patient has primary care physician: Yes Home environment has been reviewed: From home Prior level of function:: Independent Prior/Current Home Services: No current home services Social Drivers of Health Review: SDOH reviewed no interventions necessary Readmission risk has been reviewed: Yes Transition of care needs: no transition of care needs at this time

## 2024-12-13 NOTE — ED Notes (Signed)
 Patient transported to Ultrasound

## 2024-12-13 NOTE — ED Notes (Signed)
 Secretary aware to call [facility] for round trip transportation to IR.

## 2024-12-13 NOTE — H&P (View-Only) (Signed)
 "  Gastroenterology Consult   Referring Provider: No ref. provider found Primary Care Physician:  [name], FNP Primary Gastroenterologist: Carlin POUR. Cindie, DO  Patient ID: Sandra Glover; [id number]; October 11, 1986   Admit date: 12/12/2024  LOS: 1 day   Date of Consultation: 12/13/2024  Reason for Consultation:  RUQ pain and transaminitis  History of Present Illness   Sandra Glover is a 39 y.o. year old female with history of anxiety/depression, IBS with constipation, HTN, GERD, and recent chronic abdominal pain with history of cholecystectomy who presented to the ED with complaints of hematuria, lower abdominal pain, nausea, and weakness.  GI consulted for transaminitis and further evaluation of her abdominal pain which had been being worked up outpatient but recently acutely worsened.  ED Course: Labs:    Latest Ref Rng & Units 12/13/2024    3:59 AM 12/12/2024    7:47 PM 12/10/2024    8:37 PM  Hepatic Function  Total Protein 6.5 - 8.1 g/dL 5.8  7.2  6.6   Albumin 3.5 - 5.0 g/dL 3.9  4.6  4.4   AST 15 - 41 U/L 232  495  27   ALT 0 - 44 U/L 289  393  26   Alk Phosphatase 38 - 126 U/L 173  206  142   Total Bilirubin 0.0 - 1.2 mg/dL 0.5  0.4  <9.7   VSS CT renal stone study -no acute findings, stable scarring in the superior pole of the right kidney.  Stomach without abnormality, normal appendix, no evidence of bowel obstruction.  No ascites or free air.   Consult:  Patient reports that her pain has mostly been off and on and she is tolerating a bland/soft almost pured diet for the most part however her appetite has significantly decreased over the last several weeks.  She states she has been taking her Linzess  the last several days but has not had a bowel movement in about 5 days.  She has also tried some other laxatives at home without much relief.  Has been taking her PPI.  She states at times when the nausea and the pain comes on usually if she vomits she may feel  little bit better afterward however the last several days she has noticed that her pain episodes are becoming more frequent and lasting longer than usual and 1 time she even made herself vomit that did not make her feel better.  She reported primarily her pain is in the lower abdomen across when it occurs and more so when she is feeling nauseous she has some upper abdominal discomfort all the way across but otherwise pain more localized to the lower abdomen.  She has sent MyChart messages outpatient with me and has also sent a picture of the blood that she has been experiencing loose stools and it is more bright in nature with a little bit of mucus and clot.  This tends to be only when she wipes or has a bowel movement.  She does report a possible episode of some diarrhea that had blood in it but nothing on a regular basis.  She denies any jaundice or pruritus but has been experiencing fatigue  Most recent outpatient workup multiple labs and including multiple ED visits with imaging obtained:  CT A/P with contrast 11/22/2024 impression: 1. No acute or active process within the abdomen or pelvis. 2. Evidence of prior cholecystectomy. - No focal liver abnormality or biliary ductal dilation, no pancreatic ductal dilation or inflammatory  changes.  MRCP 12/04/2024 impression: 1. No acute MR findings of the abdomen to explain abdominal pain. 2. 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 3. Status post cholecystectomy.  No biliary ductal dilatation.    Past Medical History:  Diagnosis Date   Anxiety    Anxiety    Arthritis    Depression    History of anemia 2013   History of depression    states stopped med. 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   Hypertension    Osteopenia    Pertussis    Postcoital bleeding 07/14/2015   Sinus infection 01/25/2013   started antibiotic 01/24/2013 x 10 days; nasal congestion, clear  drainage from nose   Tonsillar and adenoid hypertrophy 01/2013   Vaginal irritation 03/25/2014   Mild yeast but has history of yeast, will not treat til after first trimester,try yogurt    Past Surgical History:  Procedure Laterality Date   CHOLECYSTECTOMY  04/23/2010   laparoscopic   COLONOSCOPY WITH PROPOFOL  N/A 05/17/2016   Procedure: COLONOSCOPY WITH PROPOFOL ;  Surgeon: Margo LITTIE Haddock, MD;  Location: [facility];  Service: Endoscopy;  Laterality: N/A;  1000   HEMORRHOID BANDING N/A 05/17/2016   Procedure: HEMORRHOID BANDING;  Surgeon: Margo LITTIE Haddock, MD;  Location: [facility];  Service: Endoscopy;  Laterality: N/A;   HEMORRHOID SURGERY N/A 06/08/2016   Procedure: SIMPLE HEMORRHOIDECTOMY;  Surgeon: Selinda Artist Moats, MD;  Location: [facility];  Service: General;  Laterality: N/A;   PARTIAL NEPHRECTOMY Right age 84 mos.   TONSILLECTOMY AND ADENOIDECTOMY N/A 01/29/2013   Procedure: TONSILLECTOMY AND ADENOIDECTOMY;  Surgeon: Ana LELON Moccasin, MD;  Location: [facility];  Service: ENT;  Laterality: N/A;    Prior to Admission medications  Medication Sig Start Date End Date Taking? Authorizing Provider  ALPRAZolam  (XANAX ) 1 MG tablet Take 1 tablet (1 mg total) by mouth 2 (two) times daily as needed for anxiety. 08/15/24 08/15/25  Okey Barnie SAUNDERS, MD  amphetamine -dextroamphetamine  (ADDERALL) 20 MG tablet Take 1 tablet (20 mg total) by mouth 2 (two) times daily. 12/11/24 12/11/25  Okey Barnie SAUNDERS, MD  benzonatate  (TESSALON ) 100 MG capsule Take 1 capsule (100 mg total) by mouth 3 (three) times daily as needed for cough. Do not take with alcohol or while operating or driving heavy machinery 06/09/73   Chandra Harlene LABOR, NP  calcium-vitamin D  (OSCAL WITH D) 500-200 MG-UNIT tablet Take 1 tablet by mouth.    [provider]  Drospirenone -Estetrol  3-14.2 MG TABS Take 1 tablet by mouth daily. 12/22/21   Jayne Vonn DEL, MD  etonogestrel  (NEXPLANON ) 68 MG IMPL implant 1 each by Subdermal route  once.    [provider]  FLUoxetine  (PROZAC ) 40 MG capsule Take 1 capsule (40 mg total) by mouth daily. 12/11/24   Okey Barnie SAUNDERS, MD  gabapentin  (NEURONTIN ) 100 MG capsule Take 1 capsule (100 mg total) by mouth 3 (three) times daily. 07/25/23   Margrette Taft BRAVO, MD  HYDROcodone  bit-homatropine (HYCODAN) 5-1.5 MG/5ML syrup Take 5 mLs by mouth every 6 (six) hours as needed for up to 4 days for cough. 12/10/24 12/14/24  Kammerer, Duwaine L, DO  linaclotide  (LINZESS ) 290 MCG CAPS capsule Take 1 capsule (290 mcg total) by mouth daily before breakfast. 11/13/24   Kennedy Charmaine LITTIE, NP  lisinopril -hydrochlorothiazide  (ZESTORETIC ) 20-12.5 MG tablet TAKE 1 TABLET BY MOUTH DAILY 08/08/24   Edman Das  Z, FNP  lubiprostone  (AMITIZA ) 8 MCG capsule TAKE 1 CAPSULE(8 MCG) BY MOUTH TWICE DAILY WITH A MEAL 09/23/24   Kennedy Pfeiffer L, NP  meloxicam (MOBIC) 7.5 MG tablet Take 7.5 mg by mouth daily as needed for pain. 08/15/19   [provider]  methocarbamol  (ROBAXIN ) 500 MG tablet Take 1 tablet (500 mg total) by mouth every 6 (six) hours as needed for up to 7 days for muscle spasms (pain). 12/10/24 12/17/24  Kammerer, Megan L, DO  metoprolol  tartrate (LOPRESSOR ) 100 MG tablet Take 1 tablet (100 mg total) by mouth once for 1 dose. Take 90-120 minutes prior to scan. Hold for SBP less than 110. 08/20/24 08/21/24  Floretta Mallard, MD  ondansetron  (ZOFRAN ) 4 MG tablet Take 1 tablet (4 mg total) by mouth every 6 (six) hours. 12/02/24   Kennedy Pfeiffer CROME, NP  oxyCODONE  (ROXICODONE ) 5 MG immediate release tablet Take 1 tablet (5 mg total) by mouth every 6 (six) hours as needed for up to 10 doses for severe pain (pain score 7-10). 11/22/24   Young, Thersia RAMAN, PA-C  pantoprazole  (PROTONIX ) 40 MG tablet TAKE 1 TABLET(40 MG) BY MOUTH TWICE DAILY 12/02/24   Bacchus, Gloria Z, FNP  Plecanatide  (TRULANCE ) 3 MG TABS Take 1 tablet (3 mg total) by mouth daily. 04/23/24 12/19/24  Kennedy Pfeiffer CROME, NP  polyethylene glycol  powder (GLYCOLAX /MIRALAX ) 17 GM/SCOOP powder 1-4 scoop daily or as needed 12/17/21   Jayne Vonn DEL, MD  promethazine -dextromethorphan (PROMETHAZINE -DM) 6.25-15 MG/5ML syrup Take 5 mLs by mouth 4 (four) times daily as needed. 05/06/24   Bacchus, Meade PEDLAR, FNP  QUEtiapine  (SEROQUEL ) 100 MG tablet Take 1 tablet (100 mg total) by mouth 2 (two) times daily. 08/15/24   Okey Barnie SAUNDERS, MD  Vitamin D , Ergocalciferol , (DRISDOL ) 1.25 MG (50000 UNIT) CAPS capsule Take 1 capsule (50,000 Units total) by mouth every 7 (seven) days. 12/12/23   Bacchus, Meade PEDLAR, FNP    Current Facility-Administered Medications  Medication Dose Route Frequency Provider Last Rate Last Admin   ALPRAZolam  (XANAX ) tablet 1 mg  1 mg Oral BID PRN Pahwani, Ravi, MD       amphetamine -dextroamphetamine  (ADDERALL) tablet 20 mg  20 mg Oral BID Vernon Ranks, MD       benzonatate  (TESSALON ) capsule 100 mg  100 mg Oral TID PRN Pahwani, Ravi, MD       cefTRIAXone  (ROCEPHIN ) 1 g in sodium chloride  0.9 % 100 mL IVPB  1 g Intravenous Q24H Adefeso, Oladapo, DO       FLUoxetine  (PROZAC ) capsule 40 mg  40 mg Oral Daily Pahwani, Ranks, MD       gabapentin  (NEURONTIN ) capsule 100 mg  100 mg Oral TID Vernon Ranks, MD       HYDROmorphone  (DILAUDID ) injection 0.5 mg  0.5 mg Intravenous Q4H PRN Adefeso, Oladapo, DO   0.5 mg at 12/13/24 0807   linaclotide  (LINZESS ) capsule 290 mcg  290 mcg Oral QAC breakfast Vernon Ranks, MD       methocarbamol  (ROBAXIN ) tablet 500 mg  500 mg Oral Q6H PRN Vernon Ranks, MD       ondansetron  (ZOFRAN ) tablet 4 mg  4 mg Oral Q6H PRN Adefeso, Oladapo, DO       Or   ondansetron  (ZOFRAN ) injection 4 mg  4 mg Intravenous Q6H PRN Adefeso, Oladapo, DO   4 mg at 12/13/24 0806   oxyCODONE  (Oxy IR/ROXICODONE ) immediate release tablet 5 mg  5 mg Oral Q6H PRN Pahwani, Ravi, MD  pantoprazole  (PROTONIX ) EC tablet 40 mg  40 mg Oral Daily Pahwani, Fredia, MD       Plecanatide  TABS 3 mg  1 tablet Oral Daily Pahwani, Ravi, MD        QUEtiapine  (SEROQUEL ) tablet 100 mg  100 mg Oral BID Vernon Fredia, MD       Current Outpatient Medications  Medication Sig Dispense Refill   ALPRAZolam  (XANAX ) 1 MG tablet Take 1 tablet (1 mg total) by mouth 2 (two) times daily as needed for anxiety. 60 tablet 2   amphetamine -dextroamphetamine  (ADDERALL) 20 MG tablet Take 1 tablet (20 mg total) by mouth 2 (two) times daily. 60 tablet 0   benzonatate  (TESSALON ) 100 MG capsule Take 1 capsule (100 mg total) by mouth 3 (three) times daily as needed for cough. Do not take with alcohol or while operating or driving heavy machinery 21 capsule 0   calcium-vitamin D  (OSCAL WITH D) 500-200 MG-UNIT tablet Take 1 tablet by mouth.     Drospirenone -Estetrol  3-14.2 MG TABS Take 1 tablet by mouth daily. 28 tablet 12   etonogestrel  (NEXPLANON ) 68 MG IMPL implant 1 each by Subdermal route once.     FLUoxetine  (PROZAC ) 40 MG capsule Take 1 capsule (40 mg total) by mouth daily. 30 capsule 2   gabapentin  (NEURONTIN ) 100 MG capsule Take 1 capsule (100 mg total) by mouth 3 (three) times daily. 90 capsule 2   HYDROcodone  bit-homatropine (HYCODAN) 5-1.5 MG/5ML syrup Take 5 mLs by mouth every 6 (six) hours as needed for up to 4 days for cough. 80 mL 0   linaclotide  (LINZESS ) 290 MCG CAPS capsule Take 1 capsule (290 mcg total) by mouth daily before breakfast. 30 capsule 5   lisinopril -hydrochlorothiazide  (ZESTORETIC ) 20-12.5 MG tablet TAKE 1 TABLET BY MOUTH DAILY 90 tablet 1   lubiprostone  (AMITIZA ) 8 MCG capsule TAKE 1 CAPSULE(8 MCG) BY MOUTH TWICE DAILY WITH A MEAL 180 capsule 3   meloxicam (MOBIC) 7.5 MG tablet Take 7.5 mg by mouth daily as needed for pain.     methocarbamol  (ROBAXIN ) 500 MG tablet Take 1 tablet (500 mg total) by mouth every 6 (six) hours as needed for up to 7 days for muscle spasms (pain). 28 tablet 0   metoprolol  tartrate (LOPRESSOR ) 100 MG tablet Take 1 tablet (100 mg total) by mouth once for 1 dose. Take 90-120 minutes prior to scan. Hold for SBP  less than 110. 1 tablet 0   ondansetron  (ZOFRAN ) 4 MG tablet Take 1 tablet (4 mg total) by mouth every 6 (six) hours. 12 tablet 0   oxyCODONE  (ROXICODONE ) 5 MG immediate release tablet Take 1 tablet (5 mg total) by mouth every 6 (six) hours as needed for up to 10 doses for severe pain (pain score 7-10). 10 tablet 0   pantoprazole  (PROTONIX ) 40 MG tablet TAKE 1 TABLET(40 MG) BY MOUTH TWICE DAILY 90 tablet 3   Plecanatide  (TRULANCE ) 3 MG TABS Take 1 tablet (3 mg total) by mouth daily. 30 tablet 7   polyethylene glycol powder (GLYCOLAX /MIRALAX ) 17 GM/SCOOP powder 1-4 scoop daily or as needed 255 g 11   promethazine -dextromethorphan (PROMETHAZINE -DM) 6.25-15 MG/5ML syrup Take 5 mLs by mouth 4 (four) times daily as needed. 118 mL 0   QUEtiapine  (SEROQUEL ) 100 MG tablet Take 1 tablet (100 mg total) by mouth 2 (two) times daily. 60 tablet 2   Vitamin D , Ergocalciferol , (DRISDOL ) 1.25 MG (50000 UNIT) CAPS capsule Take 1 capsule (50,000 Units total) by mouth every 7 (seven) days. 20 capsule  1    Allergies as of 12/12/2024 - Review Complete 12/12/2024  Allergen Reaction Noted   Hydrocodone -acetaminophen  Nausea Only 04/25/2023   Lortab [hydrocodone -acetaminophen ] Nausea And Vomiting 01/29/2013   Morphine and codeine Rash 01/25/2013    Family History  Problem Relation Age of Onset   COPD Mother    Depression Mother    Anxiety disorder Mother    Hypertension Mother    Irritable bowel syndrome Mother    Arthritis Mother    Skin cancer Mother    Thyroid  cancer Mother    Ovarian cancer Maternal Aunt    Ovarian cancer Maternal Grandmother    Cancer Maternal Grandmother        bladder, kidney   Hypertension Father    GER disease Father    Bipolar disorder Paternal Uncle    Anxiety disorder Cousin    Drug abuse Cousin    Bipolar disorder Cousin    Colon cancer Neg Hx     Social History   Socioeconomic History   Marital status: Married    Spouse name: Not on file   Number of children: Not  on file   Years of education: Not on file   Highest education level: Associate degree: academic program  Occupational History   Not on file  Tobacco Use   Smoking status: Never    Passive exposure: Never   Smokeless tobacco: Never   Tobacco comments:    Never smoked  Vaping Use   Vaping status: Never Used  Substance and Sexual Activity   Alcohol use: Yes    Alcohol/week: 0.0 standard drinks of alcohol    Comment: socially   Drug use: No   Sexual activity: Yes    Birth control/protection: Implant    Comment: nexplanon  implant  Other Topics Concern   Not on file  Social History Narrative   Not on file   Social Drivers of Health   Tobacco Use: Low Risk (12/12/2024)   Patient History    Smoking Tobacco Use: Never    Smokeless Tobacco Use: Never    Passive Exposure: Never  Financial Resource Strain: Medium Risk (12/11/2023)   Overall Financial Resource Strain (CARDIA)    Difficulty of Paying Living Expenses: Somewhat hard  Food Insecurity: Food Insecurity Present (12/11/2023)   Hunger Vital Sign    Worried About Running Out of Food in the Last Year: Sometimes true    Ran Out of Food in the Last Year: Patient declined  Transportation Needs: No Transportation Needs (12/11/2023)   PRAPARE - Administrator, Civil Service (Medical): No    Lack of Transportation (Non-Medical): No  Physical Activity: Insufficiently Active (12/11/2023)   Exercise Vital Sign    Days of Exercise per Week: 5 days    Minutes of Exercise per Session: 20 min  Stress: Patient Declined (12/11/2023)   [employer] of Occupational Health - Occupational Stress Questionnaire    Feeling of Stress : Patient declined  Social Connections: Unknown (12/11/2023)   Social Connection and Isolation Panel    Frequency of Communication with Friends and Family: More than three times a week    Frequency of Social Gatherings with Friends and Family: Once a week    Attends Religious Services: More than 4 times per  year    Active Member of [employer] or Organizations: Patient declined    Attends Banker Meetings: Not on file    Marital Status: Married  Intimate Partner Violence: Not on file  Depression (PHQ2-9): Medium  Risk (05/06/2024)   Depression (PHQ2-9)    PHQ-2 Score: 9  Alcohol Screen: Low Risk (12/11/2023)   Alcohol Screen    Last Alcohol Screening Score (AUDIT): 2  Housing: High Risk (12/11/2023)   Housing Stability Vital Sign    Unable to Pay for Housing in the Last Year: Yes    Number of Times Moved in the Last Year: 0    Homeless in the Last Year: No  Utilities: Not on file  Health Literacy: Not on file     Review of Systems   Gen: + lack of appetite. Denies any fever, chills, change in weight or weight loss CV: Denies chest pain, heart palpitations, syncope, edema  Resp: Denies shortness of breath with rest, cough, wheezing, coughing up blood, and pleurisy. GI: see HPI Psych: + anxiety. Denies depression, memory loss, hallucinations, and confusion. Heme: + rectal bleeding. Denies bruising Neuro:  Denies any headaches, dizziness, paresthesias, shaking  Physical Exam   Vital Signs in last 24 hours: Temp:  [97.9 F (36.6 C)-99 F (37.2 C)] 98.1 F (36.7 C) (01/09 0947) Pulse Rate:  [75-124] 75 (01/09 0806) Resp:  [10-23] 13 (01/09 0947) BP: (95-135)/(51-88) 119/74 (01/09 0947) SpO2:  [93 %-99 %] 97 % (01/09 0806) Weight:  [74.8 kg] 74.8 kg (01/08 1923)    General:   Alert,  Well-developed, well-nourished, pleasant and cooperative in NAD Head:  Normocephalic and atraumatic. Eyes:  Sclera clear, no icterus.   Conjunctiva pink. Ears:  Normal auditory acuity. Mouth:  No deformity or lesions, dentition normal. Neck:  Supple; no masses. Abdomen:  Soft, non-distended. Moderate ttp to RUQ. Mild ttp to epigastrium. Mild ttp to palpation of right flank. No masses, hepatosplenomegaly or hernias noted. Normal bowel sounds, without guarding, and without rebound.   Rectal:  deferred Neurologic:  Alert and  oriented x4. Skin:  Intact without significant lesions or rashes. Psych:  Alert and cooperative. Normal mood and affect.  Intake/Output from previous day: No intake/output data recorded. Intake/Output this shift: No intake/output data recorded.   Labs/Studies   Recent Labs Recent Labs    12/10/24 2037 12/12/24 1947 12/13/24 0359  WBC 18.2* 10.9* 8.2  HGB 13.3 12.5 11.1*  HCT 38.8 37.3 32.9*  PLT 325 272 231   BMET Recent Labs    12/10/24 2037 12/12/24 1947 12/13/24 0359  NA 138 136 138  K 4.0 4.0 4.1  CL 101 99 103  CO2 26 21* 27  GLUCOSE 117* 73 93  BUN 9 6 7   CREATININE 0.91 0.88 0.86  CALCIUM 9.1 9.2 8.1*   LFT Recent Labs    12/10/24 2037 12/12/24 1947 12/13/24 0359  PROT 6.6 7.2 5.8*  ALBUMIN 4.4 4.6 3.9  AST 27 495* 232*  ALT 26 393* 289*  ALKPHOS 142* 206* 173*  BILITOT <0.2 0.4 0.5   PT/INR Recent Labs    12/12/24 1947 12/13/24 0359  LABPROT 12.7 13.3  INR 0.9 1.0   Hepatitis Panel No results for input(s): HEPBSAG, HCVAB, HEPAIGM, HEPBIGM in the last 72 hours. C-Diff No results for input(s): CDIFFTOX in the last 72 hours.  Radiology/Studies CT Renal Stone Study Result Date: 12/12/2024 EXAM: CT ABDOMEN AND PELVIS WITHOUT CONTRAST 12/12/2024 08:29:28 PM TECHNIQUE: CT of the abdomen and pelvis was performed without the administration of intravenous contrast. Multiplanar reformatted images are provided for review. Automated exposure control, iterative reconstruction, and/or weight-based adjustment of the mA/kV was utilized to reduce the radiation dose to as low as reasonably achievable. COMPARISON: CT abdomen  and pelvis 11/22/2024. CLINICAL HISTORY: Abdominal/flank pain, stone suspected. FINDINGS: LOWER CHEST: No acute abnormality. LIVER: The liver is unremarkable. GALLBLADDER AND BILE DUCTS: Gallbladder is unremarkable. No biliary ductal dilatation. SPLEEN: No acute abnormality. PANCREAS: No acute  abnormality. ADRENAL GLANDS: No acute abnormality. 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 There is stable scarring in the superior pole of the right kidney. No stones in the kidneys or ureters. No hydronephrosis. No perinephric or periureteral stranding. Urinary bladder is unremarkable. GI AND BOWEL: Stomach demonstrates no acute abnormality. The appendix is within normal limits. There is no bowel obstruction. PERITONEUM AND RETROPERITONEUM: No ascites. No free air. VASCULATURE: Aorta is normal in caliber. LYMPH NODES: No lymphadenopathy. REPRODUCTIVE ORGANS: No acute abnormality. BONES AND SOFT TISSUES: No acute osseous abnormality. No focal soft tissue abnormality. IMPRESSION: 1. No acute findings in the abdomen or pelvis. 2. Stable scarring in the superior pole of the right kidney. Electronically signed by: Greig Pique MD MD 12/12/2024 08:39 PM EST RP Workstation: [id number]     Assessment   Sandra Glover is a 39 y.o. year old female with history of anxiety/depression, IBS with constipation, HTN, GERD, and recent chronic abdominal pain with history of cholecystectomy who presented to the ED with complaints of hematuria, lower abdominal pain, nausea, and weakness.  GI consulted for transaminitis and further evaluation of her abdominal pain.   Transaminitis, right upper quadrant pain, nausea: -LFTs have been trending up and down since 11/22/24 - AST 191>>66>>27>>495>>232 - ALT 85>> 245>> 256>>26>> 393>> 289 -Alkaline phosphatase 144>> 164>> 168>> 142>> 206>> 173 (she has had chronic mild elevation in alkaline phosphatase prior to most recent acute episodes) -INR normal, normal lipase -ANA positive without any significant elevation of any common autoantibodies or high titer -GGT significantly elevated at 239 and then 94 -AMA negative, normal IgG and IgM -Acute hepatitis panel pending - History of cholecystectomy - Has been having transient right upper quadrant  pain however over the last few days has had constant pain and nausea not relieved with vomiting - Tolerating soft bland diet at home with taking Zofran  - Given difficulties in scheduling outpatient appointments to be seen we have been working on further workup of her elevated LFTs between her ED visits. - Reports rare Tylenol  use and low doses, no NSAID use, no herbals or other over-the-counter supplements other than turmeric which she started after her first bout of pain and elevated LFTs, no alcohol use since prior to Christmas and was not a large amount. - RUQ is tender to palpation with sharp nature on examination, no real appreciable abdominal tenderness in the lower quadrants, some mild TTP to the epigastrium.  After receiving her labs earlier this week while they were actually improved I encouraged her to stop the turmeric altogether as Dili remains on the differential.   Will order additional serologic workup with PBC panel, ASMA, ceruloplasmin, alpha 1 antitrypsin phenotype, antiliver kidney antibody, HSV, CMV, EBV, to go along with liver biopsy which will be performed today if transportation can be arranged in order to help delineate etiology of her transaminitis.  Given age AIH remains high on the differential. She is agreeable to liver biopsy at this time.   Given the labile nature of her LFTs, despite history of cholecystectomy cannot rule out any occult microlithiasis or significant sludge causing temporary obstruction and pain and elevation in her LFTs despite normal MRCP.  Awaiting ultrasound for hopeful better sensitivity and also ordered liver Doppler to assess for PVT.  If any biliary ductal dilation noted or microlithiasis noted on ultrasound, will need to consider transfer to [facility] for ERCP.  Rectal bleeding and constipation: - Has history of hemorrhoids as well as hemorrhoidectomy in 2017 although likely has recurrence given the degree of constipation she has had -  Last BM in 5 days, taking Linzess  currently (just recently) however previously was well-controlled with Trulance  until notification received in October that her insurance no longer would pay for Trulance  - decreased PO intake - bleeding most likely hemorhroidal in nature - only occurring with bowel movements.  - Last colonoscopy in 2017 with normal ileum and evidence of large nonbleeding external and small internal hemorrhoids.  Plan / Recommendations   Liver biopsy today with IR PBC panel, ASMA, ceruloplasmin, alpha 1 antitrypsin phenotype, antiliver kidney antibody, HSV, CMV, EBV Trend LFTs Liver US  with doppler - follow up.  Continue PPI Continue Linzess  290 mcg daily Needs eventual outpatient evaluation of rectal bleeding with anoscopy and/or colonoscopy (most likely hemorrhoidal) Miralax  BID while inpatient and daily stool softener.  Antiemetics as needed   12/13/2024, 9:50 AM  Charmaine Melia, MSN, FNP-BC, AGACNP-BC [facility]  "

## 2024-12-13 NOTE — ED Notes (Signed)
Pt left with [facility] 

## 2024-12-13 NOTE — Consult Note (Signed)
 "  Gastroenterology Consult   Referring Provider: No ref. provider found Primary Care Physician:  [name], FNP Primary Gastroenterologist: Carlin POUR. Cindie, DO  Patient ID: Sandra Glover; [id number]; October 11, 1986   Admit date: 12/12/2024  LOS: 1 day   Date of Consultation: 12/13/2024  Reason for Consultation:  RUQ pain and transaminitis  History of Present Illness   Sandra Glover is a 39 y.o. year old female with history of anxiety/depression, IBS with constipation, HTN, GERD, and recent chronic abdominal pain with history of cholecystectomy who presented to the ED with complaints of hematuria, lower abdominal pain, nausea, and weakness.  GI consulted for transaminitis and further evaluation of her abdominal pain which had been being worked up outpatient but recently acutely worsened.  ED Course: Labs:    Latest Ref Rng & Units 12/13/2024    3:59 AM 12/12/2024    7:47 PM 12/10/2024    8:37 PM  Hepatic Function  Total Protein 6.5 - 8.1 g/dL 5.8  7.2  6.6   Albumin 3.5 - 5.0 g/dL 3.9  4.6  4.4   AST 15 - 41 U/L 232  495  27   ALT 0 - 44 U/L 289  393  26   Alk Phosphatase 38 - 126 U/L 173  206  142   Total Bilirubin 0.0 - 1.2 mg/dL 0.5  0.4  <9.7   VSS CT renal stone study -no acute findings, stable scarring in the superior pole of the right kidney.  Stomach without abnormality, normal appendix, no evidence of bowel obstruction.  No ascites or free air.   Consult:  Patient reports that her pain has mostly been off and on and she is tolerating a bland/soft almost pured diet for the most part however her appetite has significantly decreased over the last several weeks.  She states she has been taking her Linzess  the last several days but has not had a bowel movement in about 5 days.  She has also tried some other laxatives at home without much relief.  Has been taking her PPI.  She states at times when the nausea and the pain comes on usually if she vomits she may feel  little bit better afterward however the last several days she has noticed that her pain episodes are becoming more frequent and lasting longer than usual and 1 time she even made herself vomit that did not make her feel better.  She reported primarily her pain is in the lower abdomen across when it occurs and more so when she is feeling nauseous she has some upper abdominal discomfort all the way across but otherwise pain more localized to the lower abdomen.  She has sent MyChart messages outpatient with me and has also sent a picture of the blood that she has been experiencing loose stools and it is more bright in nature with a little bit of mucus and clot.  This tends to be only when she wipes or has a bowel movement.  She does report a possible episode of some diarrhea that had blood in it but nothing on a regular basis.  She denies any jaundice or pruritus but has been experiencing fatigue  Most recent outpatient workup multiple labs and including multiple ED visits with imaging obtained:  CT A/P with contrast 11/22/2024 impression: 1. No acute or active process within the abdomen or pelvis. 2. Evidence of prior cholecystectomy. - No focal liver abnormality or biliary ductal dilation, no pancreatic ductal dilation or inflammatory  changes.  MRCP 12/04/2024 impression: 1. No acute MR findings of the abdomen to explain abdominal pain. 2. 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 3. Status post cholecystectomy.  No biliary ductal dilatation.    Past Medical History:  Diagnosis Date   Anxiety    Anxiety    Arthritis    Depression    History of anemia 2013   History of depression    states stopped med. 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   Hypertension    Osteopenia    Pertussis    Postcoital bleeding 07/14/2015   Sinus infection 01/25/2013   started antibiotic 01/24/2013 x 10 days; nasal congestion, clear  drainage from nose   Tonsillar and adenoid hypertrophy 01/2013   Vaginal irritation 03/25/2014   Mild yeast but has history of yeast, will not treat til after first trimester,try yogurt    Past Surgical History:  Procedure Laterality Date   CHOLECYSTECTOMY  04/23/2010   laparoscopic   COLONOSCOPY WITH PROPOFOL  N/A 05/17/2016   Procedure: COLONOSCOPY WITH PROPOFOL ;  Surgeon: Margo LITTIE Haddock, MD;  Location: [facility];  Service: Endoscopy;  Laterality: N/A;  1000   HEMORRHOID BANDING N/A 05/17/2016   Procedure: HEMORRHOID BANDING;  Surgeon: Margo LITTIE Haddock, MD;  Location: [facility];  Service: Endoscopy;  Laterality: N/A;   HEMORRHOID SURGERY N/A 06/08/2016   Procedure: SIMPLE HEMORRHOIDECTOMY;  Surgeon: Selinda Artist Moats, MD;  Location: [facility];  Service: General;  Laterality: N/A;   PARTIAL NEPHRECTOMY Right age 84 mos.   TONSILLECTOMY AND ADENOIDECTOMY N/A 01/29/2013   Procedure: TONSILLECTOMY AND ADENOIDECTOMY;  Surgeon: Ana LELON Moccasin, MD;  Location: [facility];  Service: ENT;  Laterality: N/A;    Prior to Admission medications  Medication Sig Start Date End Date Taking? Authorizing Provider  ALPRAZolam  (XANAX ) 1 MG tablet Take 1 tablet (1 mg total) by mouth 2 (two) times daily as needed for anxiety. 08/15/24 08/15/25  Okey Barnie SAUNDERS, MD  amphetamine -dextroamphetamine  (ADDERALL) 20 MG tablet Take 1 tablet (20 mg total) by mouth 2 (two) times daily. 12/11/24 12/11/25  Okey Barnie SAUNDERS, MD  benzonatate  (TESSALON ) 100 MG capsule Take 1 capsule (100 mg total) by mouth 3 (three) times daily as needed for cough. Do not take with alcohol or while operating or driving heavy machinery 06/09/73   Chandra Harlene LABOR, NP  calcium-vitamin D  (OSCAL WITH D) 500-200 MG-UNIT tablet Take 1 tablet by mouth.    [provider]  Drospirenone -Estetrol  3-14.2 MG TABS Take 1 tablet by mouth daily. 12/22/21   Jayne Vonn DEL, MD  etonogestrel  (NEXPLANON ) 68 MG IMPL implant 1 each by Subdermal route  once.    [provider]  FLUoxetine  (PROZAC ) 40 MG capsule Take 1 capsule (40 mg total) by mouth daily. 12/11/24   Okey Barnie SAUNDERS, MD  gabapentin  (NEURONTIN ) 100 MG capsule Take 1 capsule (100 mg total) by mouth 3 (three) times daily. 07/25/23   Margrette Taft BRAVO, MD  HYDROcodone  bit-homatropine (HYCODAN) 5-1.5 MG/5ML syrup Take 5 mLs by mouth every 6 (six) hours as needed for up to 4 days for cough. 12/10/24 12/14/24  Kammerer, Duwaine L, DO  linaclotide  (LINZESS ) 290 MCG CAPS capsule Take 1 capsule (290 mcg total) by mouth daily before breakfast. 11/13/24   Kennedy Charmaine LITTIE, NP  lisinopril -hydrochlorothiazide  (ZESTORETIC ) 20-12.5 MG tablet TAKE 1 TABLET BY MOUTH DAILY 08/08/24   Edman Das  Z, FNP  lubiprostone  (AMITIZA ) 8 MCG capsule TAKE 1 CAPSULE(8 MCG) BY MOUTH TWICE DAILY WITH A MEAL 09/23/24   Kennedy Pfeiffer L, NP  meloxicam (MOBIC) 7.5 MG tablet Take 7.5 mg by mouth daily as needed for pain. 08/15/19   [provider]  methocarbamol  (ROBAXIN ) 500 MG tablet Take 1 tablet (500 mg total) by mouth every 6 (six) hours as needed for up to 7 days for muscle spasms (pain). 12/10/24 12/17/24  Kammerer, Megan L, DO  metoprolol  tartrate (LOPRESSOR ) 100 MG tablet Take 1 tablet (100 mg total) by mouth once for 1 dose. Take 90-120 minutes prior to scan. Hold for SBP less than 110. 08/20/24 08/21/24  Floretta Mallard, MD  ondansetron  (ZOFRAN ) 4 MG tablet Take 1 tablet (4 mg total) by mouth every 6 (six) hours. 12/02/24   Kennedy Pfeiffer CROME, NP  oxyCODONE  (ROXICODONE ) 5 MG immediate release tablet Take 1 tablet (5 mg total) by mouth every 6 (six) hours as needed for up to 10 doses for severe pain (pain score 7-10). 11/22/24   Young, Thersia RAMAN, PA-C  pantoprazole  (PROTONIX ) 40 MG tablet TAKE 1 TABLET(40 MG) BY MOUTH TWICE DAILY 12/02/24   Bacchus, Gloria Z, FNP  Plecanatide  (TRULANCE ) 3 MG TABS Take 1 tablet (3 mg total) by mouth daily. 04/23/24 12/19/24  Kennedy Pfeiffer CROME, NP  polyethylene glycol  powder (GLYCOLAX /MIRALAX ) 17 GM/SCOOP powder 1-4 scoop daily or as needed 12/17/21   Jayne Vonn DEL, MD  promethazine -dextromethorphan (PROMETHAZINE -DM) 6.25-15 MG/5ML syrup Take 5 mLs by mouth 4 (four) times daily as needed. 05/06/24   Bacchus, Meade PEDLAR, FNP  QUEtiapine  (SEROQUEL ) 100 MG tablet Take 1 tablet (100 mg total) by mouth 2 (two) times daily. 08/15/24   Okey Barnie SAUNDERS, MD  Vitamin D , Ergocalciferol , (DRISDOL ) 1.25 MG (50000 UNIT) CAPS capsule Take 1 capsule (50,000 Units total) by mouth every 7 (seven) days. 12/12/23   Bacchus, Meade PEDLAR, FNP    Current Facility-Administered Medications  Medication Dose Route Frequency Provider Last Rate Last Admin   ALPRAZolam  (XANAX ) tablet 1 mg  1 mg Oral BID PRN Pahwani, Ravi, MD       amphetamine -dextroamphetamine  (ADDERALL) tablet 20 mg  20 mg Oral BID Vernon Ranks, MD       benzonatate  (TESSALON ) capsule 100 mg  100 mg Oral TID PRN Pahwani, Ravi, MD       cefTRIAXone  (ROCEPHIN ) 1 g in sodium chloride  0.9 % 100 mL IVPB  1 g Intravenous Q24H Adefeso, Oladapo, DO       FLUoxetine  (PROZAC ) capsule 40 mg  40 mg Oral Daily Pahwani, Ranks, MD       gabapentin  (NEURONTIN ) capsule 100 mg  100 mg Oral TID Vernon Ranks, MD       HYDROmorphone  (DILAUDID ) injection 0.5 mg  0.5 mg Intravenous Q4H PRN Adefeso, Oladapo, DO   0.5 mg at 12/13/24 0807   linaclotide  (LINZESS ) capsule 290 mcg  290 mcg Oral QAC breakfast Vernon Ranks, MD       methocarbamol  (ROBAXIN ) tablet 500 mg  500 mg Oral Q6H PRN Vernon Ranks, MD       ondansetron  (ZOFRAN ) tablet 4 mg  4 mg Oral Q6H PRN Adefeso, Oladapo, DO       Or   ondansetron  (ZOFRAN ) injection 4 mg  4 mg Intravenous Q6H PRN Adefeso, Oladapo, DO   4 mg at 12/13/24 0806   oxyCODONE  (Oxy IR/ROXICODONE ) immediate release tablet 5 mg  5 mg Oral Q6H PRN Pahwani, Ravi, MD  pantoprazole  (PROTONIX ) EC tablet 40 mg  40 mg Oral Daily Pahwani, Fredia, MD       Plecanatide  TABS 3 mg  1 tablet Oral Daily Pahwani, Ravi, MD        QUEtiapine  (SEROQUEL ) tablet 100 mg  100 mg Oral BID Vernon Fredia, MD       Current Outpatient Medications  Medication Sig Dispense Refill   ALPRAZolam  (XANAX ) 1 MG tablet Take 1 tablet (1 mg total) by mouth 2 (two) times daily as needed for anxiety. 60 tablet 2   amphetamine -dextroamphetamine  (ADDERALL) 20 MG tablet Take 1 tablet (20 mg total) by mouth 2 (two) times daily. 60 tablet 0   benzonatate  (TESSALON ) 100 MG capsule Take 1 capsule (100 mg total) by mouth 3 (three) times daily as needed for cough. Do not take with alcohol or while operating or driving heavy machinery 21 capsule 0   calcium-vitamin D  (OSCAL WITH D) 500-200 MG-UNIT tablet Take 1 tablet by mouth.     Drospirenone -Estetrol  3-14.2 MG TABS Take 1 tablet by mouth daily. 28 tablet 12   etonogestrel  (NEXPLANON ) 68 MG IMPL implant 1 each by Subdermal route once.     FLUoxetine  (PROZAC ) 40 MG capsule Take 1 capsule (40 mg total) by mouth daily. 30 capsule 2   gabapentin  (NEURONTIN ) 100 MG capsule Take 1 capsule (100 mg total) by mouth 3 (three) times daily. 90 capsule 2   HYDROcodone  bit-homatropine (HYCODAN) 5-1.5 MG/5ML syrup Take 5 mLs by mouth every 6 (six) hours as needed for up to 4 days for cough. 80 mL 0   linaclotide  (LINZESS ) 290 MCG CAPS capsule Take 1 capsule (290 mcg total) by mouth daily before breakfast. 30 capsule 5   lisinopril -hydrochlorothiazide  (ZESTORETIC ) 20-12.5 MG tablet TAKE 1 TABLET BY MOUTH DAILY 90 tablet 1   lubiprostone  (AMITIZA ) 8 MCG capsule TAKE 1 CAPSULE(8 MCG) BY MOUTH TWICE DAILY WITH A MEAL 180 capsule 3   meloxicam (MOBIC) 7.5 MG tablet Take 7.5 mg by mouth daily as needed for pain.     methocarbamol  (ROBAXIN ) 500 MG tablet Take 1 tablet (500 mg total) by mouth every 6 (six) hours as needed for up to 7 days for muscle spasms (pain). 28 tablet 0   metoprolol  tartrate (LOPRESSOR ) 100 MG tablet Take 1 tablet (100 mg total) by mouth once for 1 dose. Take 90-120 minutes prior to scan. Hold for SBP  less than 110. 1 tablet 0   ondansetron  (ZOFRAN ) 4 MG tablet Take 1 tablet (4 mg total) by mouth every 6 (six) hours. 12 tablet 0   oxyCODONE  (ROXICODONE ) 5 MG immediate release tablet Take 1 tablet (5 mg total) by mouth every 6 (six) hours as needed for up to 10 doses for severe pain (pain score 7-10). 10 tablet 0   pantoprazole  (PROTONIX ) 40 MG tablet TAKE 1 TABLET(40 MG) BY MOUTH TWICE DAILY 90 tablet 3   Plecanatide  (TRULANCE ) 3 MG TABS Take 1 tablet (3 mg total) by mouth daily. 30 tablet 7   polyethylene glycol powder (GLYCOLAX /MIRALAX ) 17 GM/SCOOP powder 1-4 scoop daily or as needed 255 g 11   promethazine -dextromethorphan (PROMETHAZINE -DM) 6.25-15 MG/5ML syrup Take 5 mLs by mouth 4 (four) times daily as needed. 118 mL 0   QUEtiapine  (SEROQUEL ) 100 MG tablet Take 1 tablet (100 mg total) by mouth 2 (two) times daily. 60 tablet 2   Vitamin D , Ergocalciferol , (DRISDOL ) 1.25 MG (50000 UNIT) CAPS capsule Take 1 capsule (50,000 Units total) by mouth every 7 (seven) days. 20 capsule  1    Allergies as of 12/12/2024 - Review Complete 12/12/2024  Allergen Reaction Noted   Hydrocodone -acetaminophen  Nausea Only 04/25/2023   Lortab [hydrocodone -acetaminophen ] Nausea And Vomiting 01/29/2013   Morphine and codeine Rash 01/25/2013    Family History  Problem Relation Age of Onset   COPD Mother    Depression Mother    Anxiety disorder Mother    Hypertension Mother    Irritable bowel syndrome Mother    Arthritis Mother    Skin cancer Mother    Thyroid  cancer Mother    Ovarian cancer Maternal Aunt    Ovarian cancer Maternal Grandmother    Cancer Maternal Grandmother        bladder, kidney   Hypertension Father    GER disease Father    Bipolar disorder Paternal Uncle    Anxiety disorder Cousin    Drug abuse Cousin    Bipolar disorder Cousin    Colon cancer Neg Hx     Social History   Socioeconomic History   Marital status: Married    Spouse name: Not on file   Number of children: Not  on file   Years of education: Not on file   Highest education level: Associate degree: academic program  Occupational History   Not on file  Tobacco Use   Smoking status: Never    Passive exposure: Never   Smokeless tobacco: Never   Tobacco comments:    Never smoked  Vaping Use   Vaping status: Never Used  Substance and Sexual Activity   Alcohol use: Yes    Alcohol/week: 0.0 standard drinks of alcohol    Comment: socially   Drug use: No   Sexual activity: Yes    Birth control/protection: Implant    Comment: nexplanon  implant  Other Topics Concern   Not on file  Social History Narrative   Not on file   Social Drivers of Health   Tobacco Use: Low Risk (12/12/2024)   Patient History    Smoking Tobacco Use: Never    Smokeless Tobacco Use: Never    Passive Exposure: Never  Financial Resource Strain: Medium Risk (12/11/2023)   Overall Financial Resource Strain (CARDIA)    Difficulty of Paying Living Expenses: Somewhat hard  Food Insecurity: Food Insecurity Present (12/11/2023)   Hunger Vital Sign    Worried About Running Out of Food in the Last Year: Sometimes true    Ran Out of Food in the Last Year: Patient declined  Transportation Needs: No Transportation Needs (12/11/2023)   PRAPARE - Administrator, Civil Service (Medical): No    Lack of Transportation (Non-Medical): No  Physical Activity: Insufficiently Active (12/11/2023)   Exercise Vital Sign    Days of Exercise per Week: 5 days    Minutes of Exercise per Session: 20 min  Stress: Patient Declined (12/11/2023)   [employer] of Occupational Health - Occupational Stress Questionnaire    Feeling of Stress : Patient declined  Social Connections: Unknown (12/11/2023)   Social Connection and Isolation Panel    Frequency of Communication with Friends and Family: More than three times a week    Frequency of Social Gatherings with Friends and Family: Once a week    Attends Religious Services: More than 4 times per  year    Active Member of [employer] or Organizations: Patient declined    Attends Banker Meetings: Not on file    Marital Status: Married  Intimate Partner Violence: Not on file  Depression (PHQ2-9): Medium  Risk (05/06/2024)   Depression (PHQ2-9)    PHQ-2 Score: 9  Alcohol Screen: Low Risk (12/11/2023)   Alcohol Screen    Last Alcohol Screening Score (AUDIT): 2  Housing: High Risk (12/11/2023)   Housing Stability Vital Sign    Unable to Pay for Housing in the Last Year: Yes    Number of Times Moved in the Last Year: 0    Homeless in the Last Year: No  Utilities: Not on file  Health Literacy: Not on file     Review of Systems   Gen: + lack of appetite. Denies any fever, chills, change in weight or weight loss CV: Denies chest pain, heart palpitations, syncope, edema  Resp: Denies shortness of breath with rest, cough, wheezing, coughing up blood, and pleurisy. GI: see HPI Psych: + anxiety. Denies depression, memory loss, hallucinations, and confusion. Heme: + rectal bleeding. Denies bruising Neuro:  Denies any headaches, dizziness, paresthesias, shaking  Physical Exam   Vital Signs in last 24 hours: Temp:  [97.9 F (36.6 C)-99 F (37.2 C)] 98.1 F (36.7 C) (01/09 0947) Pulse Rate:  [75-124] 75 (01/09 0806) Resp:  [10-23] 13 (01/09 0947) BP: (95-135)/(51-88) 119/74 (01/09 0947) SpO2:  [93 %-99 %] 97 % (01/09 0806) Weight:  [74.8 kg] 74.8 kg (01/08 1923)    General:   Alert,  Well-developed, well-nourished, pleasant and cooperative in NAD Head:  Normocephalic and atraumatic. Eyes:  Sclera clear, no icterus.   Conjunctiva pink. Ears:  Normal auditory acuity. Mouth:  No deformity or lesions, dentition normal. Neck:  Supple; no masses. Abdomen:  Soft, non-distended. Moderate ttp to RUQ. Mild ttp to epigastrium. Mild ttp to palpation of right flank. No masses, hepatosplenomegaly or hernias noted. Normal bowel sounds, without guarding, and without rebound.   Rectal:  deferred Neurologic:  Alert and  oriented x4. Skin:  Intact without significant lesions or rashes. Psych:  Alert and cooperative. Normal mood and affect.  Intake/Output from previous day: No intake/output data recorded. Intake/Output this shift: No intake/output data recorded.   Labs/Studies   Recent Labs Recent Labs    12/10/24 2037 12/12/24 1947 12/13/24 0359  WBC 18.2* 10.9* 8.2  HGB 13.3 12.5 11.1*  HCT 38.8 37.3 32.9*  PLT 325 272 231   BMET Recent Labs    12/10/24 2037 12/12/24 1947 12/13/24 0359  NA 138 136 138  K 4.0 4.0 4.1  CL 101 99 103  CO2 26 21* 27  GLUCOSE 117* 73 93  BUN 9 6 7   CREATININE 0.91 0.88 0.86  CALCIUM 9.1 9.2 8.1*   LFT Recent Labs    12/10/24 2037 12/12/24 1947 12/13/24 0359  PROT 6.6 7.2 5.8*  ALBUMIN 4.4 4.6 3.9  AST 27 495* 232*  ALT 26 393* 289*  ALKPHOS 142* 206* 173*  BILITOT <0.2 0.4 0.5   PT/INR Recent Labs    12/12/24 1947 12/13/24 0359  LABPROT 12.7 13.3  INR 0.9 1.0   Hepatitis Panel No results for input(s): HEPBSAG, HCVAB, HEPAIGM, HEPBIGM in the last 72 hours. C-Diff No results for input(s): CDIFFTOX in the last 72 hours.  Radiology/Studies CT Renal Stone Study Result Date: 12/12/2024 EXAM: CT ABDOMEN AND PELVIS WITHOUT CONTRAST 12/12/2024 08:29:28 PM TECHNIQUE: CT of the abdomen and pelvis was performed without the administration of intravenous contrast. Multiplanar reformatted images are provided for review. Automated exposure control, iterative reconstruction, and/or weight-based adjustment of the mA/kV was utilized to reduce the radiation dose to as low as reasonably achievable. COMPARISON: CT abdomen  and pelvis 11/22/2024. CLINICAL HISTORY: Abdominal/flank pain, stone suspected. FINDINGS: LOWER CHEST: No acute abnormality. LIVER: The liver is unremarkable. GALLBLADDER AND BILE DUCTS: Gallbladder is unremarkable. No biliary ductal dilatation. SPLEEN: No acute abnormality. PANCREAS: No acute  abnormality. ADRENAL GLANDS: No acute abnormality. 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 There is stable scarring in the superior pole of the right kidney. No stones in the kidneys or ureters. No hydronephrosis. No perinephric or periureteral stranding. Urinary bladder is unremarkable. GI AND BOWEL: Stomach demonstrates no acute abnormality. The appendix is within normal limits. There is no bowel obstruction. PERITONEUM AND RETROPERITONEUM: No ascites. No free air. VASCULATURE: Aorta is normal in caliber. LYMPH NODES: No lymphadenopathy. REPRODUCTIVE ORGANS: No acute abnormality. BONES AND SOFT TISSUES: No acute osseous abnormality. No focal soft tissue abnormality. IMPRESSION: 1. No acute findings in the abdomen or pelvis. 2. Stable scarring in the superior pole of the right kidney. Electronically signed by: Greig Pique MD MD 12/12/2024 08:39 PM EST RP Workstation: [id number]     Assessment   Sandra Glover is a 39 y.o. year old female with history of anxiety/depression, IBS with constipation, HTN, GERD, and recent chronic abdominal pain with history of cholecystectomy who presented to the ED with complaints of hematuria, lower abdominal pain, nausea, and weakness.  GI consulted for transaminitis and further evaluation of her abdominal pain.   Transaminitis, right upper quadrant pain, nausea: -LFTs have been trending up and down since 11/22/24 - AST 191>>66>>27>>495>>232 - ALT 85>> 245>> 256>>26>> 393>> 289 -Alkaline phosphatase 144>> 164>> 168>> 142>> 206>> 173 (she has had chronic mild elevation in alkaline phosphatase prior to most recent acute episodes) -INR normal, normal lipase -ANA positive without any significant elevation of any common autoantibodies or high titer -GGT significantly elevated at 239 and then 94 -AMA negative, normal IgG and IgM -Acute hepatitis panel pending - History of cholecystectomy - Has been having transient right upper quadrant  pain however over the last few days has had constant pain and nausea not relieved with vomiting - Tolerating soft bland diet at home with taking Zofran  - Given difficulties in scheduling outpatient appointments to be seen we have been working on further workup of her elevated LFTs between her ED visits. - Reports rare Tylenol  use and low doses, no NSAID use, no herbals or other over-the-counter supplements other than turmeric which she started after her first bout of pain and elevated LFTs, no alcohol use since prior to Christmas and was not a large amount. - RUQ is tender to palpation with sharp nature on examination, no real appreciable abdominal tenderness in the lower quadrants, some mild TTP to the epigastrium.  After receiving her labs earlier this week while they were actually improved I encouraged her to stop the turmeric altogether as Dili remains on the differential.   Will order additional serologic workup with PBC panel, ASMA, ceruloplasmin, alpha 1 antitrypsin phenotype, antiliver kidney antibody, HSV, CMV, EBV, to go along with liver biopsy which will be performed today if transportation can be arranged in order to help delineate etiology of her transaminitis.  Given age AIH remains high on the differential. She is agreeable to liver biopsy at this time.   Given the labile nature of her LFTs, despite history of cholecystectomy cannot rule out any occult microlithiasis or significant sludge causing temporary obstruction and pain and elevation in her LFTs despite normal MRCP.  Awaiting ultrasound for hopeful better sensitivity and also ordered liver Doppler to assess for PVT.  If any biliary ductal dilation noted or microlithiasis noted on ultrasound, will need to consider transfer to [facility] for ERCP.  Rectal bleeding and constipation: - Has history of hemorrhoids as well as hemorrhoidectomy in 2017 although likely has recurrence given the degree of constipation she has had -  Last BM in 5 days, taking Linzess  currently (just recently) however previously was well-controlled with Trulance  until notification received in October that her insurance no longer would pay for Trulance  - decreased PO intake - bleeding most likely hemorhroidal in nature - only occurring with bowel movements.  - Last colonoscopy in 2017 with normal ileum and evidence of large nonbleeding external and small internal hemorrhoids.  Plan / Recommendations   Liver biopsy today with IR PBC panel, ASMA, ceruloplasmin, alpha 1 antitrypsin phenotype, antiliver kidney antibody, HSV, CMV, EBV Trend LFTs Liver US  with doppler - follow up.  Continue PPI Continue Linzess  290 mcg daily Needs eventual outpatient evaluation of rectal bleeding with anoscopy and/or colonoscopy (most likely hemorrhoidal) Miralax  BID while inpatient and daily stool softener.  Antiemetics as needed   12/13/2024, 9:50 AM  Charmaine Melia, MSN, FNP-BC, AGACNP-BC [facility]  "

## 2024-12-13 NOTE — ED Notes (Signed)
 [facility] called and requested pt to get round trip transportation to IR before noon  or pt can not get procedure done today. Dr. aware.

## 2024-12-13 NOTE — Plan of Care (Signed)

## 2024-12-13 NOTE — Consult Note (Signed)
 "     Chief Complaint: Patient was seen in consultation today for elevated liver enzymes.  Referring Physician(s): Cindie Dunnings, DO  Supervising Physician: [name] Rover  Patient Status: [facility] Gl[facility] ED --> transfer to [facility] IR for procedure  History of Present Illness: Sandra Glover is a 39 y.o. female with a past medical history of significant for anxiety, depression, HTN, IBS, GERD, chronic abdominal pain currently undergoing GI workup who presented to the ED on 12/13/24 with complaints of hematuria, right sided abdominal pain, weakness and nausea. She had been previously seen in the ED on 12/10/24 due to cough and abdominal pain, labs at that time showed leukocytosis and alk phos 142. AST/ALT within normal limits.  She was given IV hydration as well as hydcodan before being discharged home that same day.   She has been undergoing workup for abdominal pain with GI as an outpatient and has had varying degrees of LFT elevation. Upon return to the ED yesterday she was noted to have significantly elevated LFTs (AST 495, ALT 393, alk phos 206) leading to concern for possible autoimmune hepatitis. IR has been consulted for a random liver biopsy to further direct care.  Patient seen in the ED with spouse at bedside, she reports abdominal pain improved with dilaudid  and is mild currently. Nausea has resolved with Zofran . Last BM 5 days ago. She denies any use of ASA or Goodys/BC powder. Has been NPO since midnight. Discussed random liver biopsy indications/risks as well as need for [facility] to [facility] for procedure with return to [facility] after recovery - she understands and is agreeable to proceed.  Past Medical History:  Diagnosis Date   Anxiety    Anxiety    Arthritis    Depression    History of anemia 2013   History of depression    states stopped med. 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   Hypertension    Osteopenia    Pertussis     Postcoital bleeding 07/14/2015   Sinus infection 01/25/2013   started antibiotic 01/24/2013 x 10 days; nasal congestion, clear drainage from nose   Tonsillar and adenoid hypertrophy 01/2013   Vaginal irritation 03/25/2014   Mild yeast but has history of yeast, will not treat til after first trimester,try yogurt    Past Surgical History:  Procedure Laterality Date   CHOLECYSTECTOMY  04/23/2010   laparoscopic   COLONOSCOPY WITH PROPOFOL  N/A 05/17/2016   Procedure: COLONOSCOPY WITH PROPOFOL ;  Surgeon: Margo LITTIE Haddock, MD;  Location: [facility];  Service: Endoscopy;  Laterality: N/A;  1000   HEMORRHOID BANDING N/A 05/17/2016   Procedure: HEMORRHOID BANDING;  Surgeon: Margo LITTIE Haddock, MD;  Location: [facility];  Service: Endoscopy;  Laterality: N/A;   HEMORRHOID SURGERY N/A 06/08/2016   Procedure: SIMPLE HEMORRHOIDECTOMY;  Surgeon: Selinda Artist Moats, MD;  Location: [facility] ORS;  Service: General;  Laterality: N/A;   PARTIAL NEPHRECTOMY Right age 31 mos.   TONSILLECTOMY AND ADENOIDECTOMY N/A 01/29/2013   Procedure: TONSILLECTOMY AND ADENOIDECTOMY;  Surgeon: Ana LELON Moccasin, MD;  Location: [facility];  Service: ENT;  Laterality: N/A;    Allergies: Hydrocodone -acetaminophen , Lortab [hydrocodone -acetaminophen ], and Morphine and codeine  Medications: Prior to Admission medications  Medication Sig Start Date End Date Taking? Authorizing Provider  ALPRAZolam  (XANAX ) 1 MG tablet Take 1 tablet (1 mg total) by mouth 2 (two) times daily as needed for anxiety. 08/15/24 08/15/25  Okey,  Barnie SAUNDERS, MD  amphetamine -dextroamphetamine  (ADDERALL) 20 MG tablet Take 1 tablet (20 mg total) by mouth 2 (two) times daily. 12/11/24 12/11/25  Okey Barnie SAUNDERS, MD  benzonatate  (TESSALON ) 100 MG capsule Take 1 capsule (100 mg total) by mouth 3 (three) times daily as needed for cough. Do not take with alcohol or while operating or driving heavy machinery 06/09/73   Chandra Harlene LABOR, NP  calcium-vitamin D  (OSCAL WITH D)  500-200 MG-UNIT tablet Take 1 tablet by mouth.    [provider]  Drospirenone -Estetrol  3-14.2 MG TABS Take 1 tablet by mouth daily. 12/22/21   Jayne Vonn DEL, MD  etonogestrel  (NEXPLANON ) 68 MG IMPL implant 1 each by Subdermal route once.    [provider]  FLUoxetine  (PROZAC ) 40 MG capsule Take 1 capsule (40 mg total) by mouth daily. 12/11/24   Okey Barnie SAUNDERS, MD  gabapentin  (NEURONTIN ) 100 MG capsule Take 1 capsule (100 mg total) by mouth 3 (three) times daily. 07/25/23   Margrette Taft BRAVO, MD  HYDROcodone  bit-homatropine [facility]) 5-1.5 MG/5ML syrup Take 5 mLs by mouth every 6 (six) hours as needed for up to 4 days for cough. 12/10/24 12/14/24  Kammerer, Megan L, DO  linaclotide  (LINZESS ) 290 MCG CAPS capsule Take 1 capsule (290 mcg total) by mouth daily before breakfast. 11/13/24   Kennedy Charmaine CROME, NP  lisinopril -hydrochlorothiazide  (ZESTORETIC ) 20-12.5 MG tablet TAKE 1 TABLET BY MOUTH DAILY 08/08/24   Bacchus, Gloria Z, FNP  lubiprostone  (AMITIZA ) 8 MCG capsule TAKE 1 CAPSULE(8 MCG) BY MOUTH TWICE DAILY WITH A MEAL 09/23/24   Kennedy Charmaine CROME, NP  meloxicam (MOBIC) 7.5 MG tablet Take 7.5 mg by mouth daily as needed for pain. 08/15/19   [provider]  methocarbamol  (ROBAXIN ) 500 MG tablet Take 1 tablet (500 mg total) by mouth every 6 (six) hours as needed for up to 7 days for muscle spasms (pain). 12/10/24 12/17/24  Kammerer, Megan L, DO  metoprolol  tartrate (LOPRESSOR ) 100 MG tablet Take 1 tablet (100 mg total) by mouth once for 1 dose. Take 90-120 minutes prior to scan. Hold for SBP less than 110. 08/20/24 08/21/24  Floretta Mallard, MD  ondansetron  (ZOFRAN ) 4 MG tablet Take 1 tablet (4 mg total) by mouth every 6 (six) hours. 12/02/24   Kennedy Charmaine CROME, NP  oxyCODONE  (ROXICODONE ) 5 MG immediate release tablet Take 1 tablet (5 mg total) by mouth every 6 (six) hours as needed for up to 10 doses for severe pain (pain score 7-10). 11/22/24   Neysa Thersia RAMAN, PA-C   pantoprazole  (PROTONIX ) 40 MG tablet TAKE 1 TABLET(40 MG) BY MOUTH TWICE DAILY 12/02/24   Bacchus, Gloria Z, FNP  Plecanatide  (TRULANCE ) 3 MG TABS Take 1 tablet (3 mg total) by mouth daily. 04/23/24 12/19/24  Kennedy Charmaine CROME, NP  polyethylene glycol powder (GLYCOLAX /MIRALAX ) 17 GM/SCOOP powder 1-4 scoop daily or as needed 12/17/21   Jayne Vonn DEL, MD  promethazine -dextromethorphan (PROMETHAZINE -DM) 6.25-15 MG/5ML syrup Take 5 mLs by mouth 4 (four) times daily as needed. 05/06/24   Bacchus, Meade PEDLAR, FNP  QUEtiapine  (SEROQUEL ) 100 MG tablet Take 1 tablet (100 mg total) by mouth 2 (two) times daily. 08/15/24   Okey Barnie SAUNDERS, MD  Vitamin D , Ergocalciferol , (DRISDOL ) 1.25 MG (50000 UNIT) CAPS capsule Take 1 capsule (50,000 Units total) by mouth every 7 (seven) days. 12/12/23   Bacchus, Meade PEDLAR, FNP     Family History  Problem Relation Age of Onset   COPD Mother    Depression Mother  Anxiety disorder Mother    Hypertension Mother    Irritable bowel syndrome Mother    Arthritis Mother    Skin cancer Mother    Thyroid  cancer Mother    Ovarian cancer Maternal Aunt    Ovarian cancer Maternal Grandmother    Cancer Maternal Grandmother        bladder, kidney   Hypertension Father    GER disease Father    Bipolar disorder Paternal Uncle    Anxiety disorder Cousin    Drug abuse Cousin    Bipolar disorder Cousin    Colon cancer Neg Hx     Social History   Socioeconomic History   Marital status: Married    Spouse name: Not on file   Number of children: Not on file   Years of education: Not on file   Highest education level: Associate degree: academic program  Occupational History   Not on file  Tobacco Use   Smoking status: Never    Passive exposure: Never   Smokeless tobacco: Never   Tobacco comments:    Never smoked  Vaping Use   Vaping status: Never Used  Substance and Sexual Activity   Alcohol use: Yes    Alcohol/week: 0.0 standard drinks of alcohol    Comment: socially    Drug use: No   Sexual activity: Yes    Birth control/protection: Implant    Comment: nexplanon  implant  Other Topics Concern   Not on file  Social History Narrative   Not on file   Social Drivers of Health   Tobacco Use: Low Risk (12/12/2024)   Patient History    Smoking Tobacco Use: Never    Smokeless Tobacco Use: Never    Passive Exposure: Never  Financial Resource Strain: Medium Risk (12/11/2023)   Overall Financial Resource Strain (CARDIA)    Difficulty of Paying Living Expenses: Somewhat hard  Food Insecurity: Food Insecurity Present (12/11/2023)   Hunger Vital Sign    Worried About Running Out of Food in the Last Year: Sometimes true    Ran Out of Food in the Last Year: Patient declined  Transportation Needs: No Transportation Needs (12/11/2023)   PRAPARE - Administrator, Civil Service (Medical): No    Lack of Transportation (Non-Medical): No  Physical Activity: Insufficiently Active (12/11/2023)   Exercise Vital Sign    Days of Exercise per Week: 5 days    Minutes of Exercise per Session: 20 min  Stress: Patient Declined (12/11/2023)   [employer] of Occupational Health - Occupational Stress Questionnaire    Feeling of Stress : Patient declined  Social Connections: Unknown (12/11/2023)   Social Connection and Isolation Panel    Frequency of Communication with Friends and Family: More than three times a week    Frequency of Social Gatherings with Friends and Family: Once a week    Attends Religious Services: More than 4 times per year    Active Member of Clubs or Organizations: Patient declined    Attends Banker Meetings: Not on file    Marital Status: Married  Depression (PHQ2-9): Medium Risk (05/06/2024)   Depression (PHQ2-9)    PHQ-2 Score: 9  Alcohol Screen: Low Risk (12/11/2023)   Alcohol Screen    Last Alcohol Screening Score (AUDIT): 2  Housing: High Risk (12/11/2023)   Housing Stability Vital Sign    Unable to Pay for Housing in the Last  Year: Yes    Number of Times Moved in the Last Year: 0  Homeless in the Last Year: No  Utilities: Not on file  Health Literacy: Not on file     Review of Systems: A 12 point ROS discussed and pertinent positives are indicated in the HPI above.  All other systems are negative.  Review of Systems  Constitutional:  Positive for appetite change and fatigue. Negative for fever.  Respiratory:  Positive for cough. Negative for shortness of breath.   Cardiovascular:  Negative for chest pain.  Gastrointestinal:  Positive for abdominal pain and constipation. Negative for abdominal distention, diarrhea, nausea and vomiting.  Genitourinary:  Positive for hematuria.  Musculoskeletal:  Negative for back pain.  Neurological:  Negative for dizziness and headaches.    Vital Signs: BP 115/75   Pulse 75   Temp 98.1 F (36.7 C) (Oral)   Resp 15   Ht 5' 3 (1.6 m)   Wt 165 lb (74.8 kg)   SpO2 97%   BMI 29.23 kg/m   Physical Exam Vitals and nursing note reviewed.  Constitutional:      General: She is not in acute distress.    Appearance: She is ill-appearing.  HENT:     Head: Normocephalic.     Mouth/Throat:     Mouth: Mucous membranes are moist.     Pharynx: Oropharynx is clear. No oropharyngeal exudate or posterior oropharyngeal erythema.  Eyes:     General: No scleral icterus. Cardiovascular:     Rate and Rhythm: Normal rate and regular rhythm.  Pulmonary:     Effort: Pulmonary effort is normal.     Breath sounds: Normal breath sounds.  Abdominal:     General: There is no distension.     Palpations: Abdomen is soft.     Tenderness: There is no abdominal tenderness.  Skin:    General: Skin is warm and dry.     Coloration: Skin is not jaundiced.  Neurological:     Mental Status: She is alert and oriented to person, place, and time.  Psychiatric:        Mood and Affect: Mood normal.        Behavior: Behavior normal.        Thought Content: Thought content normal.         Judgment: Judgment normal.      MD Evaluation Airway: WNL Heart: WNL Abdomen: WNL Chest/ Lungs: WNL ASA  Classification: 2 Mallampati/Airway Score: One   Imaging: US  LIVER DOPPLER Result Date: 12/13/2024 EXAM: RIGHT UPPER QUADRANT ULTRASOUND WITH DOPPLER EVALUATION 12/13/2024 09:32:12 AM TECHNIQUE: Grayscale and Doppler imaging was performed of the right upper quadrant. COMPARISON: 12/12/2024, 12/04/2024 CLINICAL HISTORY: 221910 Elevated LFTs 221910; [id number] Abdominal pain [id number] FINDINGS: LIVER: Normal echogenicity. No evidence of intrahepatic biliary ductal dilatation. BILIARY SYSTEM: Cholecystectomy. No intrahepatic or extrahepatic biliary ductal dilation OTHER: No right upper quadrant ascites. VASCULAR: Doppler flow in the normal direction of the hepatic vasculature. Normal hepatopetal flow of the hepatic artery and portal vein. Normal hepatofugal flow of the hepatic veins. Normal doppler waveforms are visualized. The portal vein is of normal size and not dilated. Spleen: At the upper limits of normal for size, measuring 12.1 cm in craniocaudal dimension. IMPRESSION: 1. Patient portal vein. Otherwise, the hepatic vasculature demonstrates normal vascular doppler flow. 2. The spleen is at the upper limits of normal for size, measuring 12.1 cm. Electronically signed by: Rogelia Myers MD MD 12/13/2024 10:20 AM EST RP Workstation: [id number]   US  Abdomen Limited Result Date: 12/13/2024 EXAM: Right Upper Quadrant Abdominal Ultrasound 12/13/2024 09:32:27 AM  TECHNIQUE: Real-time ultrasonography of the right upper quadrant of the abdomen was performed. COMPARISON: 12/12/2024 CT of the abdomen and pelvis ;12/04/2024 MR of the abdomen CLINICAL HISTORY: Abdominal pain. FINDINGS: LIVER: Normal echogenicity. No intrahepatic biliary ductal dilatation. No evidence of mass. Hepatopetal flow in the portal vein. BILIARY SYSTEM: Cholecystectomy. Common bile duct is within normal limits measuring 4.4 mm. OTHER: No  right upper quadrant ascites. IMPRESSION: 1. Cholecystectomy. No intrahepatic or extrahepatic biliary ductal dilation. Electronically signed by: Rogelia Myers MD MD 12/13/2024 09:52 AM EST RP Workstation: [id number]   CT Renal Stone Study Result Date: 12/12/2024 EXAM: CT ABDOMEN AND PELVIS WITHOUT CONTRAST 12/12/2024 08:29:28 PM TECHNIQUE: CT of the abdomen and pelvis was performed without the administration of intravenous contrast. Multiplanar reformatted images are provided for review. Automated exposure control, iterative reconstruction, and/or weight-based adjustment of the mA/kV was utilized to reduce the radiation dose to as low as reasonably achievable. COMPARISON: CT abdomen and pelvis 11/22/2024. CLINICAL HISTORY: Abdominal/flank pain, stone suspected. FINDINGS: LOWER CHEST: No acute abnormality. LIVER: The liver is unremarkable. GALLBLADDER AND BILE DUCTS: Gallbladder is unremarkable. No biliary ductal dilatation. SPLEEN: No acute abnormality. PANCREAS: No acute abnormality. ADRENAL GLANDS: No acute abnormality. 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 There is stable scarring in the superior pole of the right kidney. No stones in the kidneys or ureters. No hydronephrosis. No perinephric or periureteral stranding. Urinary bladder is unremarkable. GI AND BOWEL: Stomach demonstrates no acute abnormality. The appendix is within normal limits. There is no bowel obstruction. PERITONEUM AND RETROPERITONEUM: No ascites. No free air. VASCULATURE: Aorta is normal in caliber. LYMPH NODES: No lymphadenopathy. REPRODUCTIVE ORGANS: No acute abnormality. BONES AND SOFT TISSUES: No acute osseous abnormality. No focal soft tissue abnormality. IMPRESSION: 1. No acute findings in the abdomen or pelvis. 2. Stable scarring in the superior pole of the right kidney. Electronically signed by: Greig Pique MD MD 12/12/2024 08:39 PM EST RP Workstation: [id number]   DG Chest 1 View Result Date:  12/10/2024 EXAM: 1 VIEW(S) XRAY OF THE CHEST 12/10/2024 07:55:00 PM COMPARISON: 11/22/2024 CLINICAL HISTORY: Cough FINDINGS: LUNGS AND PLEURA: No focal pulmonary opacity. No pleural effusion. No pneumothorax. HEART AND MEDIASTINUM: No acute abnormality of the cardiac and mediastinal silhouettes. BONES AND SOFT TISSUES: No acute osseous abnormality. IMPRESSION: 1. No acute process. Electronically signed by: Greig Pique MD 12/10/2024 08:03 PM EST RP Workstation: [id number]   MR ABDOMEN MRCP W WO CONTAST Result Date: 12/04/2024 CLINICAL DATA:  Abdominal pain, history of partial right nephrectomy and cholecystectomy EXAM: MRI ABDOMEN WITHOUT AND WITH CONTRAST (INCLUDING MRCP) TECHNIQUE: Multiplanar multisequence MR imaging of the abdomen was performed both before and after the administration of intravenous contrast. Heavily T2-weighted images of the biliary and pancreatic ducts were obtained, and three-dimensional MRCP images were rendered by post processing. CONTRAST:  7mL GADAVIST  GADOBUTROL  1 MMOL/ML IV SOLN COMPARISON:  11/22/2024 FINDINGS: Lower chest: No acute abnormality. Hepatobiliary: No solid liver abnormality is seen. Cholecystectomy. No biliary ductal dilatation. Pancreas: Unremarkable. No pancreatic ductal dilatation or surrounding inflammatory changes. Spleen: Normal in size without significant abnormality. Adrenals/Urinary Tract: Adrenal glands are unremarkable. 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 Kidneys are otherwise normal, without renal calculi, solid lesion, or hydronephrosis. Stomach/Bowel: Stomach is within normal limits. No evidence of bowel wall thickening, distention, or inflammatory changes. Vascular/Lymphatic: No significant vascular findings are present. No enlarged abdominal lymph nodes. Other: No abdominal wall hernia or abnormality. No ascites. Musculoskeletal: No acute or significant osseous findings.  IMPRESSION: 1. No acute MR findings of the  abdomen to explain abdominal pain. 2. 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 3. Status post cholecystectomy.  No biliary ductal dilatation. Electronically Signed   By: Marolyn JONETTA Jaksch M.D.   On: 12/04/2024 13:53   MR 3D Recon At Scanner Result Date: 12/04/2024 CLINICAL DATA:  Abdominal pain, history of partial right nephrectomy and cholecystectomy EXAM: MRI ABDOMEN WITHOUT AND WITH CONTRAST (INCLUDING MRCP) TECHNIQUE: Multiplanar multisequence MR imaging of the abdomen was performed both before and after the administration of intravenous contrast. Heavily T2-weighted images of the biliary and pancreatic ducts were obtained, and three-dimensional MRCP images were rendered by post processing. CONTRAST:  7mL GADAVIST  GADOBUTROL  1 MMOL/ML IV SOLN COMPARISON:  11/22/2024 FINDINGS: Lower chest: No acute abnormality. Hepatobiliary: No solid liver abnormality is seen. Cholecystectomy. No biliary ductal dilatation. Pancreas: Unremarkable. No pancreatic ductal dilatation or surrounding inflammatory changes. Spleen: Normal in size without significant abnormality. Adrenals/Urinary Tract: Adrenal glands are unremarkable. 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 Kidneys are otherwise normal, without renal calculi, solid lesion, or hydronephrosis. Stomach/Bowel: Stomach is within normal limits. No evidence of bowel wall thickening, distention, or inflammatory changes. Vascular/Lymphatic: No significant vascular findings are present. No enlarged abdominal lymph nodes. Other: No abdominal wall hernia or abnormality. No ascites. Musculoskeletal: No acute or significant osseous findings. IMPRESSION: 1. No acute MR findings of the abdomen to explain abdominal pain. 2. 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 3. Status post cholecystectomy.  No biliary ductal dilatation. Electronically Signed   By: Marolyn JONETTA Jaksch M.D.   On: 12/04/2024 13:53    CT ABDOMEN PELVIS W CONTRAST Result Date: 11/22/2024 CLINICAL DATA:  Diffuse abdominal pain and rectal bleeding. EXAM: CT ABDOMEN AND PELVIS WITH CONTRAST TECHNIQUE: Multidetector CT imaging of the abdomen and pelvis was performed using the standard protocol following bolus administration of intravenous contrast. RADIATION DOSE REDUCTION: This exam was performed according to the departmental dose-optimization program which includes automated exposure control, adjustment of the mA and/or kV according to patient size and/or use of iterative reconstruction technique. CONTRAST:  OMNIPAQUE  IOHEXOL  300 MG/ML  SOLN COMPARISON:  July 05, 2023 FINDINGS: Lower chest: No acute abnormality. Hepatobiliary: No focal liver abnormality is seen. Status post cholecystectomy. No biliary dilatation. Pancreas: Unremarkable. No pancreatic ductal dilatation or surrounding inflammatory changes. Spleen: Normal in size without focal abnormality. Adrenals/Urinary Tract: Adrenal glands are unremarkable. Kidneys are normal, without renal calculi, focal lesion, or hydronephrosis. Bladder is unremarkable. Stomach/Bowel: Stomach is within normal limits. Appendix appears normal. No evidence of bowel wall thickening, distention, or inflammatory changes. Vascular/Lymphatic: No significant vascular findings are present. No enlarged abdominal or pelvic lymph nodes. Reproductive: Uterus and bilateral adnexa are unremarkable. Other: No abdominal wall hernia or abnormality. No abdominopelvic ascites. Musculoskeletal: No acute or significant osseous findings. IMPRESSION: 1. No acute or active process within the abdomen or pelvis. 2. Evidence of prior cholecystectomy. Electronically Signed   By: Suzen Dials M.D.   On: 11/22/2024 19:23   DG Chest Portable 1 View Result Date: 11/22/2024 EXAM: 1 VIEW(S) XRAY OF THE CHEST 11/22/2024 05:05:00 PM COMPARISON: 06/24/2024 CLINICAL HISTORY: chest pain FINDINGS: LUNGS AND PLEURA: No focal  pulmonary opacity. No pleural effusion. No pneumothorax. HEART AND MEDIASTINUM: No acute abnormality of the cardiac and mediastinal silhouettes. BONES AND SOFT TISSUES: No acute osseous abnormality. IMPRESSION: 1. No acute cardiopulmonary abnormality. Electronically signed by: Greig Pique MD 11/22/2024 05:46 PM  EST RP Workstation: [id number]    Labs:  CBC: Recent Labs    11/27/24 0911 12/10/24 2037 12/12/24 1947 12/13/24 0359  WBC 8.7 18.2* 10.9* 8.2  HGB 13.8 13.3 12.5 11.1*  HCT 39.6 38.8 37.3 32.9*  PLT 269 325 272 231    COAGS: Recent Labs    12/12/24 1947 12/13/24 0359  INR 0.9 1.0    BMP: Recent Labs    11/22/24 1654 11/27/24 0910 12/10/24 2037 12/12/24 1947 12/13/24 0359  NA 136 139 138 136 138  K 4.3 4.7 4.0 4.0 4.1  CL 100 100 101 99 103  CO2 19* 23 26 21* 27  GLUCOSE 102* 134* 117* 73 93  BUN 12 12 9 6 7   CALCIUM 9.7 9.7 9.1 9.2 8.1*  CREATININE 0.81 1.01* 0.91 0.88 0.86  GFRNONAA >60  --  >60 >60 >60    LIVER FUNCTION TESTS: Recent Labs    11/27/24 0911 12/10/24 2037 12/12/24 1947 12/13/24 0359  BILITOT 0.3 <0.2 0.4 0.5  AST 66* 27 495* 232*  ALT 256* 26 393* 289*  ALKPHOS 168* 142* 206* 173*  PROT 6.8 6.6 7.2 5.8*  ALBUMIN 4.6 4.4 4.6 3.9    TUMOR MARKERS: No results for input(s): AFPTM, CEA, CA199, CHROMGRNA in the last 8760 hours.  Assessment and Plan:  39 y/o F currently undergoing outpatient workup for chronic abdominal pain who presented to the ED yesterday with abdominal pain, nausea, hematuria and weakness. She has had varying degrees of elevated LFTs over the last month or so leading to concern for autoimmune hepatitis. IR has been consulted for random liver biopsy to further direct care.  Plan: - [facility] to [facility] by noon today for procedure. If unable to arrive at [facility] IR by noon we will need to reschedule for next week. - Will need to recover at [facility] for several hours due to risk of significant  bleeding with this procedure. Will return to [facility] afterwards.  - Remain NPO until post procedure, sips with meds - Pre procedure labs reviewed, no additional labs indicated.  Risks and benefits of random liver biopsy was discussed with the patient and/or patient's family including, but not limited to bleeding, infection, damage to adjacent structures or low yield requiring additional tests.  All of the questions were answered and there is agreement to proceed.  Consent signed and in chart.  Thank you for this interesting consult.  I greatly enjoyed meeting Sandra Glover and look forward to participating in their care.  A copy of this report was sent to the requesting provider on this date.  Electronically Signed: Clotilda DELENA Hesselbach, PA-C 12/13/2024, 10:33 AM   I spent a total of 40 Minutes n face to face in clinical consultation, greater than 50% of which was counseling/coordinating care for elevated liver enzymes.  "

## 2024-12-13 NOTE — Progress Notes (Signed)
 " PROGRESS NOTE    [name]  FMW:[id number] DOB: 1986-03-31 DOA: 12/12/2024 PCP: [name], FNP   Brief Narrative:  HPI: Sandra Glover is a 39 y.o. female with medical history significant of hypertension, anxiety, IBS, GERD, chronic abdominal pain (undergoing outpatient workup with GI) who presents to the emergency department due to blood in urine, right lower abdominal pain, nausea, weakness which started today.     She was seen in the ED on 1/ 6/26 due to cough and abdominal pain due to worsening abdominal pain secondary to frequent cough, she has been having upper respiratory tract symptoms including runny nose and some shortness of breath.  She was treated at that time with IV hydration and Hycodan and was discharged home.   ED course In the emergency department, she was hemodynamically stable.  Workup in the ED showed normal CBC except for WBC of 10.9, BMP was normal except for bicarb of 21, AST 495, ALT 393, ALP 206.  Urinalysis was normal, lactic acid was normal CT abdomen and pelvis showed no acute findings in the abdomen or pelvis EKG personally reviewed shows sinus tachycardia at a rate of 124 bpm She was treated with IV ceftriaxone , pain medication, Zofran  and IV hydration was provided. Gastroenterologist (Dr. Cinderella) was consulted due to elevated liver enzymes, some recommendations were given. [facility] was asked to admit patient    Assessment & Plan:   Principal Problem:   Transaminasemia Active Problems:   Depression with anxiety   Constipation   ADHD (attention deficit hyperactivity disorder)   GERD (gastroesophageal reflux disease)  UTI and hematuria ruled out: Per H&P/admitting hospitalist, patient has UTI and she has been started on Rocephin .  Although patient does claim of having hematuria at home but UA is clean without any blood, nitrites, leukoesterase or white blood cells or bacteria.  Patient denies any burning urination.  Based on this, I do not  think she meets any criteria for UTI.  Will discontinue Rocephin .  Elevated liver enzymes: GI on board.  Patient is status post MRCP recently which did not show any etiology of elevated LFTs.  No mass or nodules.  GI recommends liver biopsy as they suspect possible AIH.  IR consulted.  Liver ultrasound result pending.  Acute anemia: Hemoglobin dropped to 11.1 with MCV on the upper normal range.  Will check B12 and folate and repeat CBC in the morning.  Essential hypertension: Patient on multiple antihypertensives PTA.  All of them are on hold, blood pressure controlled.  Will resume only Lopressor .  GERD: Continue Protonix .  ADHD: Resume Adderall.  Anxiety/bipolar 1 disorder/depression: Resume all PTA medications.  Constipation: Resume Linzess .  DVT prophylaxis: SCDs Start: 12/12/24 2340   Code Status: Full Code  Family Communication: Husband present at bedside.  Plan of care discussed with patient in length and he/she verbalized understanding and agreed with it.  Status is: Inpatient Remains inpatient appropriate because: Needs liver biopsy   Estimated body mass index is 29.23 kg/m as calculated from the following:   Height as of this encounter: 5' 3 (1.6 m).   Weight as of this encounter: 74.8 kg.    Nutritional Assessment: Body mass index is 29.23 kg/m.SABRA Seen by dietician.  I agree with the assessment and plan as outlined below: Nutrition Status:        . Skin Assessment: I have examined the patient's skin and I agree with the wound assessment as performed by the wound care RN as outlined below:  Consultants:  GI  Procedures:  None  Antimicrobials:  Anti-infectives (From admission, onward)    Start     Dose/Rate Route Frequency Ordered Stop   12/13/24 2000  cefTRIAXone  (ROCEPHIN ) 1 g in sodium chloride  0.9 % 100 mL IVPB  Status:  Discontinued        1 g 200 mL/hr over 30 Minutes Intravenous Every 24 hours 12/13/24 0310 12/13/24 1008   12/12/24 2000   cefTRIAXone  (ROCEPHIN ) 2 g in sodium chloride  0.9 % 100 mL IVPB        2 g 200 mL/hr over 30 Minutes Intravenous  Once 12/12/24 1951 12/12/24 2039         Subjective: Patient seen and examined in the ER, she was being evaluated by GI.  Husband at the bedside.  Patient was complaining of abdominal pain and generalized weakness and minimal nausea.  She tells us  that she has not had any bowel movement for last 5 days despite of taking MiraLAX  as well as Linzess .  She is passing flatus.  Objective: Vitals:   12/13/24 0805 12/13/24 0806 12/13/24 0947 12/13/24 1000  BP: 110/70  119/74 115/75  Pulse: 75 75    Resp:  10 13 15   Temp:   98.1 F (36.7 C)   TempSrc:   Oral   SpO2: 96% 97%    Weight:      Height:       No intake or output data in the 24 hours ending 12/13/24 1009 Filed Weights   12/12/24 1923  Weight: 74.8 kg    Examination:  General exam: Appears calm and comfortable obese Respiratory system: Clear to auscultation. Respiratory effort normal. Cardiovascular system: S1 & S2 heard, RRR. No JVD, murmurs, rubs, gallops or clicks. No pedal edema. Gastrointestinal system: Abdomen is nondistended, soft and right upper quadrant tenderness, very mild epigastric tenderness, no organomegaly or masses felt. Normal bowel sounds heard. Central nervous system: Alert and oriented. No focal neurological deficits. Extremities: Symmetric 5 x 5 power. Skin: No rashes, lesions or ulcers Psychiatry: Judgement and insight appear normal. Mood & affect appropriate.    Data Reviewed: I have personally reviewed following labs and imaging studies  CBC: Recent Labs  Lab 12/10/24 2037 12/12/24 1947 12/13/24 0359  WBC 18.2* 10.9* 8.2  NEUTROABS  --  7.4  --   HGB 13.3 12.5 11.1*  HCT 38.8 37.3 32.9*  MCV 97.5 96.6 97.3  PLT 325 272 231   Basic Metabolic Panel: Recent Labs  Lab 12/10/24 2037 12/12/24 1947 12/13/24 0359  NA 138 136 138  K 4.0 4.0 4.1  CL 101 99 103  CO2 26 21* 27   GLUCOSE 117* 73 93  BUN 9 6 7   CREATININE 0.91 0.88 0.86  CALCIUM 9.1 9.2 8.1*  MG  --   --  2.1  PHOS  --   --  3.4   GFR: Estimated Creatinine Clearance: 87.7 mL/min (by C-G formula based on SCr of 0.86 mg/dL). Liver Function Tests: Recent Labs  Lab 12/10/24 2037 12/12/24 1947 12/13/24 0359  AST 27 495* 232*  ALT 26 393* 289*  ALKPHOS 142* 206* 173*  BILITOT <0.2 0.4 0.5  PROT 6.6 7.2 5.8*  ALBUMIN 4.4 4.6 3.9   Recent Labs  Lab 12/10/24 2037  LIPASE 50   No results for input(s): AMMONIA in the last 168 hours. Coagulation Profile: Recent Labs  Lab 12/12/24 1947 12/13/24 0359  INR 0.9 1.0   Cardiac Enzymes: No results for input(s): CKTOTAL, CKMB,  CKMBINDEX, TROPONINI in the last 168 hours. BNP (last 3 results) No results for input(s): PROBNP in the last 8760 hours. HbA1C: No results for input(s): HGBA1C in the last 72 hours. CBG: No results for input(s): GLUCAP in the last 168 hours. Lipid Profile: No results for input(s): CHOL, HDL, LDLCALC, TRIG, CHOLHDL, LDLDIRECT in the last 72 hours. Thyroid  Function Tests: No results for input(s): TSH, T4TOTAL, FREET4, T3FREE, THYROIDAB in the last 72 hours. Anemia Panel: No results for input(s): VITAMINB12, FOLATE, FERRITIN, TIBC, IRON, RETICCTPCT in the last 72 hours. Sepsis Labs: Recent Labs  Lab 12/12/24 1947  LATICACIDVEN 1.7    Recent Results (from the past 240 hours)  Culture, blood (Routine x 2)     Status: None (Preliminary result)   Collection Time: 12/12/24  7:47 PM   Specimen: BLOOD  Result Value Ref Range Status   Specimen Description BLOOD BLOOD LEFT ARM  Final   Special Requests   Final    BOTTLES DRAWN AEROBIC AND ANAEROBIC Blood Culture adequate volume   Culture   Final    NO GROWTH < 12 HOURS Performed at [facility], [address], [place], [place] [address]    Report Status PENDING  Incomplete  Culture, blood (Routine x 2)     Status:  None (Preliminary result)   Collection Time: 12/12/24  7:56 PM   Specimen: BLOOD RIGHT HAND  Result Value Ref Range Status   Specimen Description BLOOD RIGHT HAND  Final   Special Requests   Final    BOTTLES DRAWN AEROBIC AND ANAEROBIC Blood Culture adequate volume   Culture   Final    NO GROWTH < 12 HOURS Performed at [facility], [address], [place], [place] [address]    Report Status PENDING  Incomplete     Radiology Studies: US  Abdomen Limited Result Date: 12/13/2024 EXAM: Right Upper Quadrant Abdominal Ultrasound 12/13/2024 09:32:27 AM TECHNIQUE: Real-time ultrasonography of the right upper quadrant of the abdomen was performed. COMPARISON: 12/12/2024 CT of the abdomen and pelvis ;12/04/2024 MR of the abdomen CLINICAL HISTORY: Abdominal pain. FINDINGS: LIVER: Normal echogenicity. No intrahepatic biliary ductal dilatation. No evidence of mass. Hepatopetal flow in the portal vein. BILIARY SYSTEM: Cholecystectomy. Common bile duct is within normal limits measuring 4.4 mm. OTHER: No right upper quadrant ascites. IMPRESSION: 1. Cholecystectomy. No intrahepatic or extrahepatic biliary ductal dilation. Electronically signed by: Rogelia Myers MD MD 12/13/2024 09:52 AM EST RP Workstation: [id number]   CT Renal Stone Study Result Date: 12/12/2024 EXAM: CT ABDOMEN AND PELVIS WITHOUT CONTRAST 12/12/2024 08:29:28 PM TECHNIQUE: CT of the abdomen and pelvis was performed without the administration of intravenous contrast. Multiplanar reformatted images are provided for review. Automated exposure control, iterative reconstruction, and/or weight-based adjustment of the mA/kV was utilized to reduce the radiation dose to as low as reasonably achievable. COMPARISON: CT abdomen and pelvis 11/22/2024. CLINICAL HISTORY: Abdominal/flank pain, stone suspected. FINDINGS: LOWER CHEST: No acute abnormality. LIVER: The liver is unremarkable. GALLBLADDER AND BILE DUCTS: Gallbladder is unremarkable. No biliary ductal  dilatation. SPLEEN: No acute abnormality. PANCREAS: No acute abnormality. ADRENAL GLANDS: No acute abnormality. 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 There is stable scarring in the superior pole of the right kidney. No stones in the kidneys or ureters. No hydronephrosis. No perinephric or periureteral stranding. Urinary bladder is unremarkable. GI AND BOWEL: Stomach demonstrates no acute abnormality. The appendix is within normal limits. There is no bowel obstruction. PERITONEUM AND RETROPERITONEUM: No ascites. No free air. VASCULATURE: Aorta is  normal in caliber. LYMPH NODES: No lymphadenopathy. REPRODUCTIVE ORGANS: No acute abnormality. BONES AND SOFT TISSUES: No acute osseous abnormality. No focal soft tissue abnormality. IMPRESSION: 1. No acute findings in the abdomen or pelvis. 2. Stable scarring in the superior pole of the right kidney. Electronically signed by: Greig Pique MD MD 12/12/2024 08:39 PM EST RP Workstation: [id number]    Scheduled Meds:  amphetamine -dextroamphetamine   20 mg Oral BID   FLUoxetine   40 mg Oral Daily   gabapentin   100 mg Oral TID   linaclotide   290 mcg Oral QAC breakfast   pantoprazole   40 mg Oral Daily   Plecanatide   1 tablet Oral Daily   QUEtiapine   100 mg Oral BID   Continuous Infusions:     LOS: 1 day   Fredia Skeeter, MD [facility]  12/13/2024, 10:09 AM   *Please note that this is a verbal dictation therefore any spelling or grammatical errors are due to the Dragon Medical One system interpretation.  Please page via Amion and do not message via secure chat for urgent patient care matters. Secure chat can be used for non urgent patient care matters.  How to contact the [facility] Attending or Consulting provider 7A - 7P or covering provider during after hours 7P -7A, for this patient?  Check the care team in [facility] and look for a) attending/consulting [facility] provider listed and b) the [facility] team listed. Page or secure chat  7A-7P. Log into [email or web address] and use [place]'s universal password to access. If you do not have the password, please contact the hospital operator. Locate the [facility] provider you are looking for under [facility] and page to a number that you can be directly reached. If you still have difficulty reaching the provider, please page the [facility] (Director on Call) for the Hospitalists listed on amion for assistance.  "

## 2024-12-14 DIAGNOSIS — R7401 Elevation of levels of liver transaminase levels: Secondary | ICD-10-CM | POA: Diagnosis not present

## 2024-12-14 LAB — BASIC METABOLIC PANEL WITH GFR
Anion gap: 8 (ref 5–15)
BUN: 8 mg/dL (ref 6–20)
CO2: 28 mmol/L (ref 22–32)
Calcium: 8.6 mg/dL — ABNORMAL LOW (ref 8.9–10.3)
Chloride: 104 mmol/L (ref 98–111)
Creatinine, Ser: 0.75 mg/dL (ref 0.44–1.00)
GFR, Estimated: >60 mL/min
Glucose, Bld: 92 mg/dL (ref 70–99)
Potassium: 4.1 mmol/L (ref 3.5–5.1)
Sodium: 140 mmol/L (ref 135–145)

## 2024-12-14 LAB — CBC WITH DIFFERENTIAL/PLATELET
Abs Immature Granulocytes: 0.03 K/uL (ref 0.00–0.07)
Basophils Absolute: 0 K/uL (ref 0.0–0.1)
Basophils Relative: 0 %
Eosinophils Absolute: 0.4 K/uL (ref 0.0–0.5)
Eosinophils Relative: 6 %
HCT: 37.7 % (ref 36.0–46.0)
Hemoglobin: 12.7 g/dL (ref 12.0–15.0)
Immature Granulocytes: 0 %
Lymphocytes Relative: 27 %
Lymphs Abs: 2 K/uL (ref 0.7–4.0)
MCH: 33.5 pg (ref 26.0–34.0)
MCHC: 33.7 g/dL (ref 30.0–36.0)
MCV: 99.5 fL (ref 80.0–100.0)
Monocytes Absolute: 0.6 K/uL (ref 0.1–1.0)
Monocytes Relative: 8 %
Neutro Abs: 4.4 K/uL (ref 1.7–7.7)
Neutrophils Relative %: 59 %
Platelets: 248 K/uL (ref 150–400)
RBC: 3.79 MIL/uL — ABNORMAL LOW (ref 3.87–5.11)
RDW: 11.9 % (ref 11.5–15.5)
WBC: 7.5 K/uL (ref 4.0–10.5)
nRBC: 0 % (ref 0.0–0.2)

## 2024-12-14 LAB — HEPATIC FUNCTION PANEL
ALT: 204 U/L — ABNORMAL HIGH (ref 0–44)
AST: 94 U/L — ABNORMAL HIGH (ref 15–41)
Albumin: 3.8 g/dL (ref 3.5–5.0)
Alkaline Phosphatase: 182 U/L — ABNORMAL HIGH (ref 38–126)
Bilirubin, Direct: 0.2 mg/dL (ref 0.0–0.2)
Indirect Bilirubin: 0.2 mg/dL — ABNORMAL LOW (ref 0.3–0.9)
Total Bilirubin: 0.3 mg/dL (ref 0.0–1.2)
Total Protein: 6 g/dL — ABNORMAL LOW (ref 6.5–8.1)

## 2024-12-14 LAB — EPSTEIN-BARR VIRUS (EBV) ANTIBODY PROFILE
EBV NA IgG: 52.2 U/mL — ABNORMAL HIGH (ref 0.0–17.9)
EBV VCA IgG: 132 U/mL — ABNORMAL HIGH (ref 0.0–17.9)
EBV VCA IgM: <36 U/mL (ref 0.0–35.9)

## 2024-12-14 LAB — CMV ANTIBODY, IGG (EIA): CMV Ab - IgG: <0.6 U/mL (ref 0.00–0.59)

## 2024-12-14 LAB — HSV(HERPES SIMPLEX VRS) I + II AB-IGG
HSV 1 Glycoprotein G Ab, IgG: NONREACTIVE
HSV 2 Glycoprotein G Ab, IgG: NONREACTIVE

## 2024-12-14 LAB — CERULOPLASMIN: Ceruloplasmin: 25.2 mg/dL (ref 19.0–39.0)

## 2024-12-14 LAB — CMV IGM: CMV IgM: <30 [AU]/ml (ref 0.0–29.9)

## 2024-12-14 MED ORDER — WITCH HAZEL-GLYCERIN EX PADS: MEDICATED_PAD | CUTANEOUS | Status: DC | PRN

## 2024-12-14 MED ORDER — HYDROMORPHONE HCL 1 MG/ML IJ SOLN
1.0000 mg | Freq: Once | INTRAMUSCULAR | Status: AC
  Administered 2024-12-14: 1 mg via INTRAVENOUS
  Filled 2024-12-14: qty 1

## 2024-12-14 NOTE — Progress Notes (Signed)
 " PROGRESS NOTE  Sandra Glover  FMW:[id number] DOB: 1986/11/18 DOA: 12/12/2024 PCP: [name], FNP  Consultants  Brief Narrative: 39 y.o. female with medical history significant of hypertension, anxiety, IBS, GERD, chronic abdominal pain (undergoing outpatient workup with GI) who presents to the emergency department due to blood in urine, right lower abdominal pain, nausea, weakness which started today.     She was seen in the ED on 1/ 6/26 due to cough and abdominal pain due to worsening abdominal pain secondary to frequent cough, she has been having upper respiratory tract symptoms including runny nose and some shortness of breath.  She was treated at that time with IV hydration and Hycodan and was discharged home.   ED course In the emergency department, she was hemodynamically stable.  Workup in the ED showed normal CBC except for WBC of 10.9, BMP was normal except for bicarb of 21, AST 495, ALT 393, ALP 206.  Urinalysis was normal, lactic acid was normal CT abdomen and pelvis showed no acute findings in the abdomen or pelvis EKG personally reviewed shows sinus tachycardia at a rate of 124 bpm She was treated with IV ceftriaxone , pain medication, Zofran  and IV hydration was provided. Gastroenterologist (Dr. Cinderella) was consulted due to elevated liver enzymes, some recommendations were given. [facility] was asked to admit patient   Assessment & Plan: Abd pain: - ongoing issue for some time.   - had acute episode of sharp abdominal pain mostly in epigastric region as well as rectal region after bowel movement today. - She reports this happens often after she has a bowel movement.  She reports she usually goes for a bowel movement every 4 to 6 days. - Was fairly uncomfortable on my exam.  She received a total of 1.5 mg Dilaudid  for relief of her pain.  I did check on her again about 3 hours later and her pain had completely resolved by that point. - Ongoing right upper quadrant pain which  is being worked up outpatient.  Concerns for possible autoimmune hepatitis, as below.  Elevated liver enzymes:  - GI on board.  Patient is status post MRCP recently which did not show any etiology of elevated LFTs.  No mass or nodules.  GI recommends liver biopsy as they suspect possible AIH.  IR consulted.  Liver ultrasound performed 1/9 and results still pending.  UTI and hematuria ruled out:  - Per H&P/admitting hospitalist, patient has UTI and she has been started on Rocephin .  Although patient does claim of having hematuria at home but UA is clean without any blood, nitrites, leukoesterase or white blood cells or bacteria.  Patient denies any burning urination.  Based on this, I do not think she meets any criteria for UTI.  Will discontinue Rocephin .   Acute anemia: Hemoglobin dropped to 11.1 with MCV on the upper normal range.  Will check B12 and folate and repeat CBC in the morning.   Essential hypertension:  Patient on multiple antihypertensives PTA.  All of them are on hold, blood pressure controlled.  Will resume only Lopressor .   GERD: Continue Protonix .   ADHD: Resume Adderall.   Anxiety/bipolar 1 disorder/depression: Resume all PTA medications.   Constipation:  - Ongoing issue for her.  She is on a host of stool softeners as well as her home Linzess .   -Despite this still with ongoing constipation.  Continue home medications.  Appreciate GI input..         DVT prophylaxis:  SCDs Start:  12/12/24 2340  Code Status:   Code Status: Full Code Family Communication: Mother, husband at bedside at different times today.  All questions answered. Level of care: Med-Surg Status is: Inpatient  Consults called: Gastroenterology, interventional radiology  Subjective: On initial exam patient writhing in bed in pain.  Describing sharp/shooting epigastric pain.  Recheck several hours later showed her resting comfortably bed after she received pain medication.  Able to tolerate a  little bit of lunch after pain medicine.  Objective: Vitals:   12/13/24 1806 12/13/24 2231 12/14/24 0234 12/14/24 1307  BP: 123/83 117/72 119/87 108/65  Pulse: 86 71 81 89  Resp: 20 17 17 16   Temp: 98.5 F (36.9 C) 97.8 F (36.6 C) 97.7 F (36.5 C) 98.5 F (36.9 C)  TempSrc: Oral Oral Oral Oral  SpO2: 98% 96% 95% 95%  Weight:      Height:        Intake/Output Summary (Last 24 hours) at 12/14/2024 1708 Last data filed at 12/14/2024 0424 Gross per 24 hour  Intake 720 ml  Output --  Net 720 ml   Filed Weights   12/12/24 1923  Weight: 74.8 kg   Body mass index is 29.23 kg/m.  Gen: 39 y.o. female in moderate distress, writhing in bed on initial exam.  On recheck she was much more comfortable and laying in bed without further pain. Pulm: Non-labored breathing.  Clear to auscultation bilaterally.  CV: Regular rate and rhythm. No murmur, rub, or gallop. No JVD GI: Abdomen soft, nondistended.  Tender to palpation throughout uppe quadrants.  On initial exam. Ext: Warm, no deformities, no pedal edema Skin: No rashes, lesions  Neuro: Alert and oriented. No focal neurological deficits. Psych: Calm  Judgement and insight appear normal. Mood & affect appropriate.     I have personally reviewed the following labs and images: CBC: Recent Labs  Lab 12/10/24 2037 12/12/24 1947 12/13/24 0359 12/14/24 0535  WBC 18.2* 10.9* 8.2 7.5  NEUTROABS  --  7.4  --  4.4  HGB 13.3 12.5 11.1* 12.7  HCT 38.8 37.3 32.9* 37.7  MCV 97.5 96.6 97.3 99.5  PLT 325 272 231 248   BMP &GFR Recent Labs  Lab 12/10/24 2037 12/12/24 1947 12/13/24 0359 12/14/24 0535  NA 138 136 138 140  K 4.0 4.0 4.1 4.1  CL 101 99 103 104  CO2 26 21* 27 28  GLUCOSE 117* 73 93 92  BUN 9 6 7 8   CREATININE 0.91 0.88 0.86 0.75  CALCIUM 9.1 9.2 8.1* 8.6*  MG  --   --  2.1  --   PHOS  --   --  3.4  --    Estimated Creatinine Clearance: 94.2 mL/min (by C-G formula based on SCr of 0.75 mg/dL). Liver &  Pancreas: Recent Labs  Lab 12/10/24 2037 12/12/24 1947 12/13/24 0359 12/14/24 0535  AST 27 495* 232* 94*  ALT 26 393* 289* 204*  ALKPHOS 142* 206* 173* 182*  BILITOT <0.2 0.4 0.5 0.3  PROT 6.6 7.2 5.8* 6.0*  ALBUMIN 4.4 4.6 3.9 3.8   Recent Labs  Lab 12/10/24 2037  LIPASE 50   No results for input(s): AMMONIA in the last 168 hours. Diabetic: No results for input(s): HGBA1C in the last 72 hours. No results for input(s): GLUCAP in the last 168 hours. Cardiac Enzymes: No results for input(s): CKTOTAL, CKMB, CKMBINDEX, TROPONINI in the last 168 hours. No results for input(s): PROBNP in the last 8760 hours. Coagulation Profile: Recent Labs  Lab 12/12/24 1947 12/13/24 0359  INR 0.9 1.0   Thyroid  Function Tests: No results for input(s): TSH, T4TOTAL, FREET4, T3FREE, THYROIDAB in the last 72 hours. Lipid Profile: No results for input(s): CHOL, HDL, LDLCALC, TRIG, CHOLHDL, LDLDIRECT in the last 72 hours. Anemia Panel: Recent Labs    12/13/24 1007  VITAMINB12 726  FOLATE 6.3   Urine analysis:    Component Value Date/Time   COLORURINE YELLOW 12/12/2024 1925   APPEARANCEUR HAZY (A) 12/12/2024 1925   LABSPEC 1.005 12/12/2024 1925   PHURINE 6.0 12/12/2024 1925   GLUCOSEU NEGATIVE 12/12/2024 1925   HGBUR NEGATIVE 12/12/2024 1925   BILIRUBINUR NEGATIVE 12/12/2024 1925   BILIRUBINUR negative 08/19/2022 1313   KETONESUR NEGATIVE 12/12/2024 1925   PROTEINUR NEGATIVE 12/12/2024 1925   UROBILINOGEN 0.2 08/19/2022 1313   UROBILINOGEN 0.2 09/06/2015 2351   NITRITE NEGATIVE 12/12/2024 1925   LEUKOCYTESUR NEGATIVE 12/12/2024 1925   Sepsis Labs: Invalid input(s): PROCALCITONIN, LACTICIDVEN  Microbiology: Recent Results (from the past 240 hours)  Culture, blood (Routine x 2)     Status: None (Preliminary result)   Collection Time: 12/12/24  7:47 PM   Specimen: BLOOD  Result Value Ref Range Status   Specimen Description BLOOD BLOOD  LEFT ARM  Final   Special Requests   Final    BOTTLES DRAWN AEROBIC AND ANAEROBIC Blood Culture adequate volume   Culture   Final    NO GROWTH 2 DAYS Performed at [facility], [address] [place], [place] [address]    Report Status PENDING  Incomplete  Culture, blood (Routine x 2)     Status: None (Preliminary result)   Collection Time: 12/12/24  7:56 PM   Specimen: BLOOD RIGHT HAND  Result Value Ref Range Status   Specimen Description BLOOD RIGHT HAND  Final   Special Requests   Final    BOTTLES DRAWN AEROBIC AND ANAEROBIC Blood Culture adequate volume   Culture   Final    NO GROWTH 2 DAYS Performed at [facility], [address], [place], [place] [address]    Report Status PENDING  Incomplete    Radiology Studies: No results found.  Scheduled Meds:  amphetamine -dextroamphetamine   20 mg Oral BID   docusate sodium   100 mg Oral Daily   FLUoxetine   40 mg Oral Daily   linaclotide   290 mcg Oral QAC breakfast   pantoprazole   40 mg Oral Daily   polyethylene glycol  17 g Oral BID   Continuous Infusions:   LOS: 2 days   35 minutes with more than 50% spent in reviewing records, counseling patient/family and coordinating care.  Reyes VEAR Gaw, MD [facility] [email or web address] 12/14/2024, 5:08 PM    "

## 2024-12-14 NOTE — Plan of Care (Signed)

## 2024-12-14 NOTE — Progress Notes (Signed)
 Contacted Dr. Elpidio concerning unrelieved pain and he ordered dilaudid  1 mg one time dose and that has given some relief .

## 2024-12-14 NOTE — Progress Notes (Addendum)
 Has had no more bms today and since 1 mg dose of dilaudid  has been more comfortable but just stated that she feels pain might be returning.   Gave dilaudid  and oxy to preempt pain.

## 2024-12-14 NOTE — Progress Notes (Addendum)
 Having tenderness and pain over abdomen and now rectal  pain due to having hemorrhoids and passing hard stools. Stools are brown , hard log shaped.  Crying due to pain and received oxycodone  5 mg and then dilauidd 0.5 mg.  States that she has the same problem at home but doesn't take anything for the pain.

## 2024-12-15 ENCOUNTER — Inpatient Hospital Stay (HOSPITAL_COMMUNITY)

## 2024-12-15 DIAGNOSIS — R1013 Epigastric pain: Secondary | ICD-10-CM

## 2024-12-15 DIAGNOSIS — K625 Hemorrhage of anus and rectum: Secondary | ICD-10-CM | POA: Diagnosis not present

## 2024-12-15 DIAGNOSIS — R748 Abnormal levels of other serum enzymes: Secondary | ICD-10-CM | POA: Diagnosis not present

## 2024-12-15 DIAGNOSIS — R7401 Elevation of levels of liver transaminase levels: Secondary | ICD-10-CM | POA: Diagnosis not present

## 2024-12-15 DIAGNOSIS — R109 Unspecified abdominal pain: Secondary | ICD-10-CM | POA: Diagnosis not present

## 2024-12-15 DIAGNOSIS — K5904 Chronic idiopathic constipation: Secondary | ICD-10-CM | POA: Diagnosis not present

## 2024-12-15 LAB — CBC
HCT: 35 % — ABNORMAL LOW (ref 36.0–46.0)
Hemoglobin: 12 g/dL (ref 12.0–15.0)
MCH: 33.9 pg (ref 26.0–34.0)
MCHC: 34.3 g/dL (ref 30.0–36.0)
MCV: 98.9 fL (ref 80.0–100.0)
Platelets: 237 K/uL (ref 150–400)
RBC: 3.54 MIL/uL — ABNORMAL LOW (ref 3.87–5.11)
RDW: 11.9 % (ref 11.5–15.5)
WBC: 7.6 K/uL (ref 4.0–10.5)
nRBC: 0 % (ref 0.0–0.2)

## 2024-12-15 LAB — COMPREHENSIVE METABOLIC PANEL WITH GFR
ALT: 155 U/L — ABNORMAL HIGH (ref 0–44)
AST: 64 U/L — ABNORMAL HIGH (ref 15–41)
Albumin: 3.8 g/dL (ref 3.5–5.0)
Alkaline Phosphatase: 194 U/L — ABNORMAL HIGH (ref 38–126)
Anion gap: 7 (ref 5–15)
BUN: 7 mg/dL (ref 6–20)
CO2: 28 mmol/L (ref 22–32)
Calcium: 8.6 mg/dL — ABNORMAL LOW (ref 8.9–10.3)
Chloride: 104 mmol/L (ref 98–111)
Creatinine, Ser: 0.81 mg/dL (ref 0.44–1.00)
GFR, Estimated: >60 mL/min
Glucose, Bld: 97 mg/dL (ref 70–99)
Potassium: 3.9 mmol/L (ref 3.5–5.1)
Sodium: 139 mmol/L (ref 135–145)
Total Bilirubin: 0.2 mg/dL (ref 0.0–1.2)
Total Protein: 6 g/dL — ABNORMAL LOW (ref 6.5–8.1)

## 2024-12-15 LAB — ANTI-SMOOTH MUSCLE ANTIBODY, IGG: F-Actin IgG: 2 U (ref 0–19)

## 2024-12-15 MED ORDER — SIMETHICONE 80 MG PO CHEW
80.0000 mg | CHEWABLE_TABLET | Freq: Three times a day (TID) | ORAL | Status: DC
  Administered 2024-12-15 – 2024-12-21 (×18): 80 mg via ORAL
  Filled 2024-12-15 (×16): qty 1

## 2024-12-15 NOTE — Progress Notes (Signed)
 Subjective: Patient complaining of worsening abdominal pain today.  Primarily epigastric/right upper quadrant.  States this worsened after she ate breakfast this morning.  Had a small bowel movement yesterday with mild rectal bleeding.  Objective: Vital signs in last 24 hours: Temp:  [98 F (36.7 C)-98.5 F (36.9 C)] 98.1 F (36.7 C) (01/11 0414) Pulse Rate:  [70-89] 70 (01/11 0414) Resp:  [16-18] 18 (01/11 0414) BP: (107-116)/(65-74) 116/74 (01/11 0414) SpO2:  [94 %-95 %] 94 % (01/11 0414) Last BM Date : 12/14/24 General:   Alert and oriented, pleasant Head:  Normocephalic and atraumatic. Eyes:  No icterus, sclera clear. Conjuctiva pink.  Abdomen:  Bowel sounds present, soft, tender to palpation, non-distended. No HSM or hernias noted. No rebound or guarding. No masses appreciated  Msk:  Symmetrical without gross deformities. Normal posture. Extremities:  Without clubbing or edema. Neurologic:  Alert and  oriented x4;  grossly normal neurologically. Skin:  Warm and dry, intact without significant lesions.  Cervical Nodes:  No significant cervical adenopathy. Psych:  Alert and cooperative. Normal mood and affect.  Intake/Output from previous day: 01/10 0701 - 01/11 0700 In: 360 [P.O.:360] Out: -  Intake/Output this shift: No intake/output data recorded.  Lab Results: Recent Labs    12/13/24 0359 12/14/24 0535 12/15/24 0537  WBC 8.2 7.5 7.6  HGB 11.1* 12.7 12.0  HCT 32.9* 37.7 35.0*  PLT 231 248 237   BMET Recent Labs    12/13/24 0359 12/14/24 0535 12/15/24 0537  NA 138 140 139  K 4.1 4.1 3.9  CL 103 104 104  CO2 27 28 28   GLUCOSE 93 92 97  BUN 7 8 7   CREATININE 0.86 0.75 0.81  CALCIUM 8.1* 8.6* 8.6*   LFT Recent Labs    12/13/24 0359 12/14/24 0535 12/15/24 0537  PROT 5.8* 6.0* 6.0*  ALBUMIN 3.9 3.8 3.8  AST 232* 94* 64*  ALT 289* 204* 155*  ALKPHOS 173* 182* 194*  BILITOT 0.5 0.3 0.2  BILIDIR  --  0.2  --   IBILI  --  0.2*  --    PT/INR Recent  Labs    12/12/24 1947 12/13/24 0359  LABPROT 12.7 13.3  INR 0.9 1.0   Hepatitis Panel Recent Labs    12/13/24 0359  HEPBSAG NON REACTIVE  HCVAB NON REACTIVE  HEPAIGM NON REACTIVE  HEPBIGM NON REACTIVE     Studies/Results: US  BIOPSY (LIVER) Result Date: 12/13/2024 INDICATION: 39 year old female with history of elevated liver enzymes. EXAM: ULTRASOUND BIOPSY CORE LIVER MEDICATIONS: None. ANESTHESIA/SEDATION: Moderate (conscious) sedation was employed during this procedure. A total of Versed  3 mg and Fentanyl  100 mcg was administered intravenously. Moderate Sedation Time: 10 minutes. The patient's level of consciousness and vital signs were monitored continuously by radiology nursing throughout the procedure under my direct supervision. COMPLICATIONS: None immediate. PROCEDURE: Informed written consent was obtained from the patient after a thorough discussion of the procedural risks, benefits and alternatives. All questions were addressed. Maximal Sterile Barrier Technique was utilized including caps, mask, sterile gowns, sterile gloves, sterile drape, hand hygiene and skin antiseptic. A timeout was performed prior to the initiation of the procedure. Preprocedure ultrasound demonstrated safe window in the right upper quadrant for nonfocal liver biopsy. The right upper quadrant was prepped and draped in standard fashion. Local anesthesia was administered subdermally at the planned entry site as well as under ultrasound guidance along the hepatic capsule. A skin nick was made. A 17 gauge introducer needle was advanced to the hepatic parenchyma under  ultrasound guidance. Next, a total of 2, 18 gauge core biopsies were obtained. The samples were placed in formalin and sent to Pathology. Under ultrasound guidance, a Gel-Foam slurry was administered along the needle entry tract as the introducer needle was withdrawn. Postprocedure ultrasound demonstrated no evidence of perihepatic fluid collection. The  patient tolerated the procedure well. IMPRESSION: Technically successful ultrasound-guided nonfocal core liver biopsy from the right lobe of the liver. Ester Sides, MD [facility] Electronically Signed   By: Ester Sides M.D.   On: 12/13/2024 13:22    Assessment/Plan:  1.  Elevated LFTs-etiology unclear.  Status post cholecystectomy.  ANA positive though immunoglobulin levels normal.  ASMA pending.  Negative for viral hepatitis.  Ceruloplasmin, HSV, CMV, EBV unremarkable.  Further serologies pending.  Imaging including CT, MRI/MRCP, ultrasound reassuring.  Underwent liver biopsy 12/13/2024, results pending.  Given her concomitant abdominal pain and episodic nature of her symptoms, sphincter of Oddi dysfunction also on differential.  May benefit from outpatient sphincter of Oddi manometry.  2.  Abdominal pain-worsening abdominal pain this morning.  Patient appears quite uncomfortable.  Will check KUB, possible her constipation playing a role.  Given her chronic NSAID use, will also proceed with upper endoscopy tomorrow to further evaluate for peptic ulcer disease, esophagitis, gastritis, H. Pylori, duodenitis, or other. Will also evaluate for esophageal stricture, Schatzki's ring, esophageal web or other.   The risks including infection, bleed, or perforation as well as benefits, limitations, alternatives and imponderables have been reviewed with the patient. Potential for esophageal dilation, biopsy, etc. have also been reviewed.  Questions have been answered. All parties agreeable.  3.  Chronic constipation-small bowel movement yesterday, first BM in 6 days.  Previously well-controlled on Trulance , now not covered.  Continue Linzess  290 mcg daily.  Continue MiraLAX  twice daily on top of this.  Likely worsened in the setting of ongoing opioid analgesia requirement.  Consider addition of Movantik.  4.  Rectal bleeding-mild, self-limiting,  likely hemorrhoidal in the setting of constipation.  Consider outpatient colonoscopy to further evaluate.  GI to continue to follow.  Carlin POUR. Cindie, D.O. Gastroenterology and Hepatology [facility]   LOS: 3 days    12/15/2024, 10:04 AM

## 2024-12-15 NOTE — Progress Notes (Signed)
 " PROGRESS NOTE  Sandra Glover  FMW:[id number] DOB: 1986/10/13 DOA: 12/12/2024 PCP: [name], FNP  Consultants  Brief Narrative: 39 y.o. female with medical history significant of hypertension, anxiety, IBS, GERD, chronic abdominal pain (undergoing outpatient workup with GI) who presents to the emergency department due to blood in urine, right lower abdominal pain, nausea, weakness which started today.     She was seen in the ED on 1/ 6/26 due to cough and abdominal pain due to worsening abdominal pain secondary to frequent cough, she has been having upper respiratory tract symptoms including runny nose and some shortness of breath.  She was treated at that time with IV hydration and Hycodan and was discharged home.   ED course In the emergency department, she was hemodynamically stable.  Workup in the ED showed normal CBC except for WBC of 10.9, BMP was normal except for bicarb of 21, AST 495, ALT 393, ALP 206.  Urinalysis was normal, lactic acid was normal CT abdomen and pelvis showed no acute findings in the abdomen or pelvis EKG personally reviewed shows sinus tachycardia at a rate of 124 bpm She was treated with IV ceftriaxone , pain medication, Zofran  and IV hydration was provided. TRH was asked to admit patient.  GI following.     Assessment & Plan: Abd pain: - ongoing issue for some time.   - has intermittent acute episodes of sharp abdominal pain mostly in epigastric region as well as rectal region after bowel movements.  Occurred AM 1/10 and again this AM.   - KUB obtained due to abd distension, non-obstructive bowel gas pattern.  - has been lying in bed since admission.  Would benefit from OOB and ambulation.   - Ongoing right upper quadrant pain which is being worked up outpatient.  Concerns for possible autoimmune hepatitis, as below.  Elevated liver enzymes:  - GI on board.  Patient is status post MRCP recently which did not show any etiology of elevated LFTs.  No  mass or nodules.  - s/p liver bx 1/9 -- results pending.  Majority of serologies negative thus far.   - s/p cholecystectomy.   - LFTs downtrending currently.    UTI and hematuria ruled out:  - concern earlier in admission but pt asx and UA negative, so abx were stopped.  No further issues.    Acute anemia:  - resolved.    Essential hypertension:  - Patient on multiple antihypertensives PTA.  All of them are on hold, blood pressure controlled.  Will resume only Lopressor .   GERD: Continue Protonix .   ADHD: Resume Adderall.   Anxiety/bipolar 1 disorder/depression:  - Resume all PTA medications. - could be contributing some to chronic abd pain above   Constipation:  - Ongoing issue for her.  She is on a host of stool softeners as well as her home Linzess .   -Despite this still with ongoing constipation.  Continue home medications.  Appreciate GI input..     DVT prophylaxis:  SCDs Start: 12/12/24 2340  Code Status:   Code Status: Full Code Family Communication: Mother, husband at bedside at different times today.  All questions answered. Level of care: Med-Surg Status is: Inpatient  Consults called: Gastroenterology, interventional radiology  Subjective: Patient with some pain on exam today, but much less pain on exam than yesterday.  Still with some  sharp/shooting epigastric pain but nothing like yesterday.  Able to eat this AM.  No N/V.    Objective: Vitals:   12/14/24  1307 12/14/24 1958 12/15/24 0414 12/15/24 1100  BP: 108/65 107/70 116/74 131/83  Pulse: 89 82 70 86  Resp: 16 17 18 18   Temp: 98.5 F (36.9 C) 98 F (36.7 C) 98.1 F (36.7 C) 98.1 F (36.7 C)  TempSrc: Oral Oral Oral Oral  SpO2: 95% 95% 94% 98%  Weight:      Height:        Intake/Output Summary (Last 24 hours) at 12/15/2024 1414 Last data filed at 12/15/2024 0303 Gross per 24 hour  Intake 360 ml  Output --  Net 360 ml   Filed Weights   12/12/24 1923  Weight: 74.8 kg   Body mass index is  29.23 kg/m.  Gen: 39 y.o. female in moderate distress, writhing in bed on initial exam.  On recheck she was much more comfortable and laying in bed without further pain. Pulm: Non-labored breathing.  Clear to auscultation bilaterally.  CV: Regular rate and rhythm. No murmur, rub, or gallop. No JVD GI: Abdomen soft, distended today.  Somewhat tympanic.  Mildly TTP BL upper quadrants, no guarding or rebound.     Ext: Warm, no deformities, no pedal edema Skin: No rashes, lesions  Neuro: Alert and oriented. No focal neurological deficits. Psych: Calm  Judgement and insight appear normal. Mood & affect appropriate.     I have personally reviewed the following labs and images: CBC: Recent Labs  Lab 12/10/24 2037 12/12/24 1947 12/13/24 0359 12/14/24 0535 12/15/24 0537  WBC 18.2* 10.9* 8.2 7.5 7.6  NEUTROABS  --  7.4  --  4.4  --   HGB 13.3 12.5 11.1* 12.7 12.0  HCT 38.8 37.3 32.9* 37.7 35.0*  MCV 97.5 96.6 97.3 99.5 98.9  PLT 325 272 231 248 237   BMP &GFR Recent Labs  Lab 12/10/24 2037 12/12/24 1947 12/13/24 0359 12/14/24 0535 12/15/24 0537  NA 138 136 138 140 139  K 4.0 4.0 4.1 4.1 3.9  CL 101 99 103 104 104  CO2 26 21* 27 28 28   GLUCOSE 117* 73 93 92 97  BUN 9 6 7 8 7   CREATININE 0.91 0.88 0.86 0.75 0.81  CALCIUM 9.1 9.2 8.1* 8.6* 8.6*  MG  --   --  2.1  --   --   PHOS  --   --  3.4  --   --    Estimated Creatinine Clearance: 93.1 mL/min (by C-G formula based on SCr of 0.81 mg/dL). Liver & Pancreas: Recent Labs  Lab 12/10/24 2037 12/12/24 1947 12/13/24 0359 12/14/24 0535 12/15/24 0537  AST 27 495* 232* 94* 64*  ALT 26 393* 289* 204* 155*  ALKPHOS 142* 206* 173* 182* 194*  BILITOT <0.2 0.4 0.5 0.3 0.2  PROT 6.6 7.2 5.8* 6.0* 6.0*  ALBUMIN 4.4 4.6 3.9 3.8 3.8   Recent Labs  Lab 12/10/24 2037  LIPASE 50   No results for input(s): AMMONIA in the last 168 hours. Diabetic: No results for input(s): HGBA1C in the last 72 hours. No results for input(s):  GLUCAP in the last 168 hours. Cardiac Enzymes: No results for input(s): CKTOTAL, CKMB, CKMBINDEX, TROPONINI in the last 168 hours. No results for input(s): PROBNP in the last 8760 hours. Coagulation Profile: Recent Labs  Lab 12/12/24 1947 12/13/24 0359  INR 0.9 1.0   Thyroid  Function Tests: No results for input(s): TSH, T4TOTAL, FREET4, T3FREE, THYROIDAB in the last 72 hours. Lipid Profile: No results for input(s): CHOL, HDL, LDLCALC, TRIG, CHOLHDL, LDLDIRECT in the last 72 hours. Anemia  Panel: Recent Labs    12/13/24 1007  VITAMINB12 726  FOLATE 6.3   Urine analysis:    Component Value Date/Time   COLORURINE YELLOW 12/12/2024 1925   APPEARANCEUR HAZY (A) 12/12/2024 1925   LABSPEC 1.005 12/12/2024 1925   PHURINE 6.0 12/12/2024 1925   GLUCOSEU NEGATIVE 12/12/2024 1925   HGBUR NEGATIVE 12/12/2024 1925   BILIRUBINUR NEGATIVE 12/12/2024 1925   BILIRUBINUR negative 08/19/2022 1313   KETONESUR NEGATIVE 12/12/2024 1925   PROTEINUR NEGATIVE 12/12/2024 1925   UROBILINOGEN 0.2 08/19/2022 1313   UROBILINOGEN 0.2 09/06/2015 2351   NITRITE NEGATIVE 12/12/2024 1925   LEUKOCYTESUR NEGATIVE 12/12/2024 1925   Sepsis Labs: Invalid input(s): PROCALCITONIN, LACTICIDVEN  Microbiology: Recent Results (from the past 240 hours)  Culture, blood (Routine x 2)     Status: None (Preliminary result)   Collection Time: 12/12/24  7:47 PM   Specimen: BLOOD  Result Value Ref Range Status   Specimen Description BLOOD BLOOD LEFT ARM  Final   Special Requests   Final    BOTTLES DRAWN AEROBIC AND ANAEROBIC Blood Culture adequate volume   Culture   Final    NO GROWTH 3 DAYS Performed at [facility], [address] [place], [place] [address]    Report Status PENDING  Incomplete  Culture, blood (Routine x 2)     Status: None (Preliminary result)   Collection Time: 12/12/24  7:56 PM   Specimen: BLOOD RIGHT HAND  Result Value Ref Range Status   Specimen  Description BLOOD RIGHT HAND  Final   Special Requests   Final    BOTTLES DRAWN AEROBIC AND ANAEROBIC Blood Culture adequate volume   Culture   Final    NO GROWTH 3 DAYS Performed at [facility], [address], [place], [place] [address]    Report Status PENDING  Incomplete    Radiology Studies: DG Abd 1 View Result Date: 12/15/2024 EXAM: 1 VIEW XRAY OF THE ABDOMEN 12/15/2024 10:11:00 AM COMPARISON: CT abdomen and pelvis 12/12/2024. CLINICAL HISTORY: 39 year old female with abdominal pain. FINDINGS: BOWEL: Volume of bowel gas has increased from the recent CT, but the pattern is nonobstructed. SOFT TISSUES: Surgical clips in right upper quadrant. No abnormal calcifications. BONES: No acute fracture. IMPRESSION: 1. Non-obstructed bowel gas pattern.  No acute radiographic finding. Electronically signed by: Helayne Hurst MD MD 12/15/2024 10:34 AM EST RP Workstation: [id number]    Scheduled Meds:  amphetamine -dextroamphetamine   20 mg Oral BID   docusate sodium   100 mg Oral Daily   FLUoxetine   40 mg Oral Daily   linaclotide   290 mcg Oral QAC breakfast   pantoprazole   40 mg Oral Daily   polyethylene glycol  17 g Oral BID   simethicone   80 mg Oral TID AC   Continuous Infusions:   LOS: 3 days   35 minutes with more than 50% spent in reviewing records, counseling patient/family and coordinating care.  Reyes VEAR Gaw, MD [facility] [email or web address] 12/15/2024, 2:14 PM    "

## 2024-12-15 NOTE — ED Provider Notes (Signed)
 " [facility] MEDICAL SURGICAL UNIT Provider Note   CSN: [id number] Arrival date & time: 12/12/24  1900     Patient presents with: Hematuria and Fever   Sandra Glover is a 39 y.o. female.   Patient with a history of irritable bowel and chronic abdominal pain.  She presented with cough and abdominal pain.  The history is provided by the patient.  Abdominal Pain Pain location:  Generalized Pain quality: aching   Pain radiates to:  Does not radiate Pain severity:  Mild Onset quality:  Sudden Timing:  Intermittent Progression:  Waxing and waning Chronicity:  New Associated symptoms: no chest pain, no cough, no diarrhea, no fatigue and no hematuria        Prior to Admission medications  Medication Sig Start Date End Date Taking? Authorizing Provider  benzonatate  (TESSALON ) 100 MG capsule Take 1 capsule (100 mg total) by mouth 3 (three) times daily as needed for cough. Do not take with alcohol or while operating or driving heavy machinery 06/09/73  Yes Chandra Harlene A, NP  ALPRAZolam  (XANAX ) 1 MG tablet Take 1 tablet (1 mg total) by mouth 2 (two) times daily as needed for anxiety. 08/15/24 08/15/25  Okey Barnie SAUNDERS, MD  amphetamine -dextroamphetamine  (ADDERALL) 20 MG tablet Take 1 tablet (20 mg total) by mouth 2 (two) times daily. 12/11/24 12/11/25  Okey Barnie SAUNDERS, MD  calcium-vitamin D  (OSCAL WITH D) 500-200 MG-UNIT tablet Take 1 tablet by mouth.    [provider]  Drospirenone -Estetrol  3-14.2 MG TABS Take 1 tablet by mouth daily. 12/22/21   Jayne Vonn VEAR, MD  etonogestrel  (NEXPLANON ) 68 MG IMPL implant 1 each by Subdermal route once.    [provider]  FLUoxetine  (PROZAC ) 40 MG capsule Take 1 capsule (40 mg total) by mouth daily. 12/11/24   Okey Barnie SAUNDERS, MD  gabapentin  (NEURONTIN ) 100 MG capsule Take 1 capsule (100 mg total) by mouth 3 (three) times daily. 07/25/23   Margrette Taft BRAVO, MD  linaclotide  (LINZESS ) 290 MCG CAPS capsule Take 1 capsule (290 mcg  total) by mouth daily before breakfast. 11/13/24   Kennedy Charmaine CROME, NP  lisinopril -hydrochlorothiazide  (ZESTORETIC ) 20-12.5 MG tablet TAKE 1 TABLET BY MOUTH DAILY 08/08/24   Bacchus, Gloria Z, FNP  lubiprostone  (AMITIZA ) 8 MCG capsule TAKE 1 CAPSULE(8 MCG) BY MOUTH TWICE DAILY WITH A MEAL 09/23/24   Kennedy Charmaine CROME, NP  meloxicam (MOBIC) 7.5 MG tablet Take 7.5 mg by mouth daily as needed for pain. 08/15/19   [provider]  methocarbamol  (ROBAXIN ) 500 MG tablet Take 1 tablet (500 mg total) by mouth every 6 (six) hours as needed for up to 7 days for muscle spasms (pain). 12/10/24 12/17/24  Kammerer, Megan L, DO  metoprolol  tartrate (LOPRESSOR ) 100 MG tablet Take 1 tablet (100 mg total) by mouth once for 1 dose. Take 90-120 minutes prior to scan. Hold for SBP less than 110. 08/20/24 08/21/24  Floretta Mallard, MD  ondansetron  (ZOFRAN ) 4 MG tablet Take 1 tablet (4 mg total) by mouth every 6 (six) hours. 12/02/24   Kennedy Charmaine CROME, NP  oxyCODONE  (ROXICODONE ) 5 MG immediate release tablet Take 1 tablet (5 mg total) by mouth every 6 (six) hours as needed for up to 10 doses for severe pain (pain score 7-10). 11/22/24   Young, Thersia RAMAN, PA-C  pantoprazole  (PROTONIX ) 40 MG tablet TAKE 1 TABLET(40 MG) BY MOUTH TWICE DAILY 12/02/24   Bacchus, Gloria Z, FNP  polyethylene glycol powder (GLYCOLAX /MIRALAX ) 17 GM/SCOOP powder 1-4 scoop  daily or as needed 12/17/21   Jayne Vonn DEL, MD  promethazine -dextromethorphan (PROMETHAZINE -DM) 6.25-15 MG/5ML syrup Take 5 mLs by mouth 4 (four) times daily as needed. 05/06/24   Bacchus, Meade PEDLAR, FNP  QUEtiapine  (SEROQUEL ) 100 MG tablet Take 1 tablet (100 mg total) by mouth 2 (two) times daily. 08/15/24   Okey Barnie SAUNDERS, MD  Vitamin D , Ergocalciferol , (DRISDOL ) 1.25 MG (50000 UNIT) CAPS capsule Take 1 capsule (50,000 Units total) by mouth every 7 (seven) days. 12/12/23   Bacchus, Meade PEDLAR, FNP    Allergies: Hydrocodone -acetaminophen , Lortab [hydrocodone -acetaminophen ], and  Morphine and codeine    Review of Systems  Constitutional:  Negative for appetite change and fatigue.  HENT:  Negative for congestion, ear discharge and sinus pressure.   Eyes:  Negative for discharge.  Respiratory:  Negative for cough.   Cardiovascular:  Negative for chest pain.  Gastrointestinal:  Positive for abdominal pain. Negative for diarrhea.  Genitourinary:  Negative for frequency and hematuria.  Musculoskeletal:  Negative for back pain.  Skin:  Negative for rash.  Neurological:  Negative for seizures and headaches.  Psychiatric/Behavioral:  Negative for hallucinations.     Updated Vital Signs BP 116/74 (BP Location: Left Arm)   Pulse 70   Temp 98.1 F (36.7 C) (Oral)   Resp 18   Ht 5' 3 (1.6 m)   Wt 74.8 kg   SpO2 94%   BMI 29.23 kg/m   Physical Exam Vitals and nursing note reviewed.  Constitutional:      Appearance: She is well-developed.  HENT:     Head: Normocephalic.     Nose: Nose normal.  Eyes:     General: No scleral icterus.    Conjunctiva/sclera: Conjunctivae normal.  Neck:     Thyroid : No thyromegaly.  Cardiovascular:     Rate and Rhythm: Normal rate and regular rhythm.     Heart sounds: No murmur heard.    No friction rub. No gallop.  Pulmonary:     Breath sounds: No stridor. No wheezing or rales.  Chest:     Chest wall: No tenderness.  Abdominal:     General: There is no distension.     Tenderness: There is no abdominal tenderness. There is no rebound.  Musculoskeletal:        General: Normal range of motion.     Cervical back: Neck supple.  Lymphadenopathy:     Cervical: No cervical adenopathy.  Skin:    Findings: No erythema or rash.  Neurological:     Mental Status: She is alert and oriented to person, place, and time.     Motor: No abnormal muscle tone.     Coordination: Coordination normal.  Psychiatric:        Behavior: Behavior normal.     (all labs ordered are listed, but only abnormal results are displayed) Labs  Reviewed  COMPREHENSIVE METABOLIC PANEL WITH GFR - Abnormal; Notable for the following components:      Result Value   CO2 21 (*)    AST 495 (*)    ALT 393 (*)    Alkaline Phosphatase 206 (*)    Anion gap 17 (*)    All other components within normal limits  CBC WITH DIFFERENTIAL/PLATELET - Abnormal; Notable for the following components:   WBC 10.9 (*)    RBC 3.86 (*)    Monocytes Absolute 1.1 (*)    All other components within normal limits  URINALYSIS, W/ REFLEX TO CULTURE (INFECTION SUSPECTED) - Abnormal; Notable for  the following components:   APPearance HAZY (*)    All other components within normal limits  COMPREHENSIVE METABOLIC PANEL WITH GFR - Abnormal; Notable for the following components:   Calcium 8.1 (*)    Total Protein 5.8 (*)    AST 232 (*)    ALT 289 (*)    Alkaline Phosphatase 173 (*)    All other components within normal limits  CBC - Abnormal; Notable for the following components:   RBC 3.38 (*)    Hemoglobin 11.1 (*)    HCT 32.9 (*)    All other components within normal limits  EPSTEIN-BARR VIRUS (EBV) ANTIBODY PROFILE - Abnormal; Notable for the following components:   EBV VCA IgG 132.0 (*)    EBV NA IgG 52.2 (*)    All other components within normal limits  CBC WITH DIFFERENTIAL/PLATELET - Abnormal; Notable for the following components:   RBC 3.79 (*)    All other components within normal limits  BASIC METABOLIC PANEL WITH GFR - Abnormal; Notable for the following components:   Calcium 8.6 (*)    All other components within normal limits  HEPATIC FUNCTION PANEL - Abnormal; Notable for the following components:   Total Protein 6.0 (*)    AST 94 (*)    ALT 204 (*)    Alkaline Phosphatase 182 (*)    Indirect Bilirubin 0.2 (*)    All other components within normal limits  COMPREHENSIVE METABOLIC PANEL WITH GFR - Abnormal; Notable for the following components:   Calcium 8.6 (*)    Total Protein 6.0 (*)    AST 64 (*)    ALT 155 (*)    Alkaline  Phosphatase 194 (*)    All other components within normal limits  CBC - Abnormal; Notable for the following components:   RBC 3.54 (*)    HCT 35.0 (*)    All other components within normal limits  CULTURE, BLOOD (ROUTINE X 2)  CULTURE, BLOOD (ROUTINE X 2)  LACTIC ACID, PLASMA  PROTIME-INR  HIV ANTIBODY (ROUTINE TESTING W REFLEX)  MAGNESIUM   PHOSPHORUS  HEPATITIS PANEL, ACUTE  PROTIME-INR  FOLATE  VITAMIN B12  CERULOPLASMIN  HSV(HERPES SIMPLEX VRS) I + II AB-IGG  CMV IGM  CMV ANTIBODY, IGG (EIA)  MISC [facility] TEST (SEND OUT)  ANTI-SMOOTH MUSCLE ANTIBODY, IGG  ALPHA-1-ANTITRYPSIN PHENOTYP  ANTI-MICROSOMAL ANTIBODY LIVER / KIDNEY  POC URINE PREG, ED  SURGICAL PATHOLOGY    EKG: EKG Interpretation Date/Time:  Thursday December 12 2024 19:29:33 EST Ventricular Rate:  124 PR Interval:  146 QRS Duration:  70 QT Interval:  318 QTC Calculation: 456 R Axis:   60  Text Interpretation: Sinus tachycardia Otherwise normal ECG When compared with ECG of 22-Nov-2024 16:27, Nonspecific T wave abnormality has replaced inverted T waves in Lateral leads Confirmed by Dasie Faden ([id number]) on 12/13/2024 10:11:36 AM  Radiology: US  BIOPSY (LIVER) Result Date: 12/13/2024 INDICATION: 39 year old female with history of elevated liver enzymes. EXAM: ULTRASOUND BIOPSY CORE LIVER MEDICATIONS: None. ANESTHESIA/SEDATION: Moderate (conscious) sedation was employed during this procedure. A total of Versed  3 mg and Fentanyl  100 mcg was administered intravenously. Moderate Sedation Time: 10 minutes. The patient's level of consciousness and vital signs were monitored continuously by radiology nursing throughout the procedure under my direct supervision. COMPLICATIONS: None immediate. PROCEDURE: Informed written consent was obtained from the patient after a thorough discussion of the procedural risks, benefits and alternatives. All questions were addressed. Maximal Sterile Barrier Technique was utilized including  caps, mask, sterile gowns,  sterile gloves, sterile drape, hand hygiene and skin antiseptic. A timeout was performed prior to the initiation of the procedure. Preprocedure ultrasound demonstrated safe window in the right upper quadrant for nonfocal liver biopsy. The right upper quadrant was prepped and draped in standard fashion. Local anesthesia was administered subdermally at the planned entry site as well as under ultrasound guidance along the hepatic capsule. A skin nick was made. A 17 gauge introducer needle was advanced to the hepatic parenchyma under ultrasound guidance. Next, a total of 2, 18 gauge core biopsies were obtained. The samples were placed in formalin and sent to Pathology. Under ultrasound guidance, a Gel-Foam slurry was administered along the needle entry tract as the introducer needle was withdrawn. Postprocedure ultrasound demonstrated no evidence of perihepatic fluid collection. The patient tolerated the procedure well. IMPRESSION: Technically successful ultrasound-guided nonfocal core liver biopsy from the right lobe of the liver. Ester Sides, MD [facility] Electronically Signed   By: Ester Sides M.D.   On: 12/13/2024 13:22     Procedures   Medications Ordered in the ED  ondansetron  (ZOFRAN ) tablet 4 mg (4 mg Oral Given 12/14/24 0646)    Or  ondansetron  (ZOFRAN ) injection 4 mg ( Intravenous See Alternative 12/14/24 0646)  HYDROmorphone  (DILAUDID ) injection 0.5 mg (0.5 mg Intravenous Given 12/15/24 0929)  amphetamine -dextroamphetamine  (ADDERALL) tablet 20 mg (20 mg Oral Given 12/15/24 0818)  ALPRAZolam  (XANAX ) tablet 1 mg (1 mg Oral Given 12/14/24 2111)  benzonatate  (TESSALON ) capsule 100 mg (has no administration in time range)  FLUoxetine  (PROZAC ) capsule 40 mg (40 mg Oral Given 12/15/24 0817)  linaclotide  (LINZESS ) capsule 290 mcg (290 mcg Oral Given 12/15/24 0818)  methocarbamol  (ROBAXIN ) tablet 500 mg (500 mg Oral  Given 12/13/24 1841)  oxyCODONE  (Oxy IR/ROXICODONE ) immediate release tablet 5 mg (5 mg Oral Given 12/14/24 2336)  pantoprazole  (PROTONIX ) EC tablet 40 mg (40 mg Oral Given 12/15/24 0819)  polyethylene glycol (MIRALAX  / GLYCOLAX ) packet 17 g (17 g Oral Given 12/15/24 0816)  docusate sodium  (COLACE) capsule 100 mg (100 mg Oral Given 12/15/24 0817)  witch hazel-glycerin  (TUCKS) pad (has no administration in time range)  sodium chloride  0.9 % bolus 1,000 mL (0 mLs Intravenous Stopped 12/12/24 2055)  ketorolac  (TORADOL ) 30 MG/ML injection 30 mg (30 mg Intravenous Given 12/12/24 2003)  ondansetron  (ZOFRAN ) injection 4 mg (4 mg Intravenous Given 12/12/24 1958)  cefTRIAXone  (ROCEPHIN ) 2 g in sodium chloride  0.9 % 100 mL IVPB (0 g Intravenous Stopped 12/12/24 2039)  midazolam  PF (VERSED ) injection (1 mg Intravenous Given 12/13/24 1250)  fentaNYL  (SUBLIMAZE ) injection (50 mcg Intravenous Given 12/13/24 1246)  lidocaine  (PF) (XYLOCAINE ) 1 % injection 10 mL (10 mLs Intradermal Given by Other 12/13/24 1240)  HYDROmorphone  (DILAUDID ) injection 1 mg (1 mg Intravenous Given 12/14/24 1157)                                    Medical Decision Making Amount and/or Complexity of Data Reviewed Labs: ordered. Radiology: ordered.  Risk Prescription drug management. Decision regarding hospitalization.   Patient with elevated liver enzymes of unknown cause.  She will be admitted to medicine with GI consult    Final diagnoses:  None    ED Discharge Orders     None          Suzette Pac, MD 12/15/24 1027  "

## 2024-12-15 NOTE — Plan of Care (Signed)

## 2024-12-16 ENCOUNTER — Inpatient Hospital Stay (HOSPITAL_COMMUNITY): Admitting: Certified Registered"

## 2024-12-16 ENCOUNTER — Encounter (HOSPITAL_COMMUNITY)
Admission: EM | Disposition: A | Discharge status: Home / Self Care | Payer: Self-pay | Source: Home / Self Care | Attending: Internal Medicine

## 2024-12-16 DIAGNOSIS — K449 Diaphragmatic hernia without obstruction or gangrene: Secondary | ICD-10-CM

## 2024-12-16 DIAGNOSIS — K3189 Other diseases of stomach and duodenum: Secondary | ICD-10-CM | POA: Diagnosis not present

## 2024-12-16 DIAGNOSIS — K259 Gastric ulcer, unspecified as acute or chronic, without hemorrhage or perforation: Secondary | ICD-10-CM

## 2024-12-16 DIAGNOSIS — K2950 Unspecified chronic gastritis without bleeding: Secondary | ICD-10-CM

## 2024-12-16 DIAGNOSIS — K2980 Duodenitis without bleeding: Secondary | ICD-10-CM

## 2024-12-16 DIAGNOSIS — R7401 Elevation of levels of liver transaminase levels: Secondary | ICD-10-CM | POA: Diagnosis not present

## 2024-12-16 HISTORY — PX: ESOPHAGOGASTRODUODENOSCOPY: SHX5428

## 2024-12-16 LAB — COMPREHENSIVE METABOLIC PANEL WITH GFR
ALT: 138 U/L — ABNORMAL HIGH (ref 0–44)
AST: 62 U/L — ABNORMAL HIGH (ref 15–41)
Albumin: 3.8 g/dL (ref 3.5–5.0)
Alkaline Phosphatase: 178 U/L — ABNORMAL HIGH (ref 38–126)
Anion gap: 7 (ref 5–15)
BUN: 7 mg/dL (ref 6–20)
CO2: 30 mmol/L (ref 22–32)
Calcium: 8.8 mg/dL — ABNORMAL LOW (ref 8.9–10.3)
Chloride: 103 mmol/L (ref 98–111)
Creatinine, Ser: 0.82 mg/dL (ref 0.44–1.00)
GFR, Estimated: >60 mL/min
Glucose, Bld: 92 mg/dL (ref 70–99)
Potassium: 3.9 mmol/L (ref 3.5–5.1)
Sodium: 139 mmol/L (ref 135–145)
Total Bilirubin: 0.3 mg/dL (ref 0.0–1.2)
Total Protein: 6.1 g/dL — ABNORMAL LOW (ref 6.5–8.1)

## 2024-12-16 LAB — MISC LABCORP TEST (SEND OUT): Labcorp test code: 520192

## 2024-12-16 LAB — CBC
HCT: 35.6 % — ABNORMAL LOW (ref 36.0–46.0)
Hemoglobin: 11.8 g/dL — ABNORMAL LOW (ref 12.0–15.0)
MCH: 32.8 pg (ref 26.0–34.0)
MCHC: 33.1 g/dL (ref 30.0–36.0)
MCV: 98.9 fL (ref 80.0–100.0)
Platelets: 257 K/uL (ref 150–400)
RBC: 3.6 MIL/uL — ABNORMAL LOW (ref 3.87–5.11)
RDW: 11.7 % (ref 11.5–15.5)
WBC: 7.4 K/uL (ref 4.0–10.5)
nRBC: 0 % (ref 0.0–0.2)

## 2024-12-16 LAB — ALPHA-1-ANTITRYPSIN PHENOTYP: A-1 Antitrypsin, Ser: 153 mg/dL (ref 100–188)

## 2024-12-16 LAB — ANTI-MICROSOMAL ANTIBODY LIVER / KIDNEY: LKM1 Ab: 0.5 U (ref 0.0–20.0)

## 2024-12-16 MED ORDER — HYDRALAZINE HCL 20 MG/ML IJ SOLN: 10.0000 mg | INTRAMUSCULAR | Status: DC | PRN

## 2024-12-16 MED ORDER — LACTATED RINGERS IV SOLN: INTRAVENOUS | Status: DC | PRN

## 2024-12-16 MED ORDER — TRAZODONE HCL 50 MG PO TABS
50.0000 mg | ORAL_TABLET | Freq: Every evening | ORAL | Status: DC | PRN
  Administered 2024-12-19 – 2024-12-20 (×2): 50 mg via ORAL
  Filled 2024-12-16 (×2): qty 1

## 2024-12-16 MED ORDER — PROPOFOL 10 MG/ML IV BOLUS
INTRAVENOUS | Status: DC | PRN
  Administered 2024-12-16: 100 mg via INTRAVENOUS
  Administered 2024-12-16 (×4): 50 mg via INTRAVENOUS
  Administered 2024-12-16: 100 mg via INTRAVENOUS

## 2024-12-16 MED ORDER — METOPROLOL TARTRATE 5 MG/5ML IV SOLN: 5.0000 mg | INTRAVENOUS | Status: DC | PRN

## 2024-12-16 MED ORDER — IPRATROPIUM-ALBUTEROL 0.5-2.5 (3) MG/3ML IN SOLN: 3.0000 mL | RESPIRATORY_TRACT | Status: DC | PRN

## 2024-12-16 MED ORDER — IPRATROPIUM-ALBUTEROL 0.5-2.5 (3) MG/3ML IN SOLN
RESPIRATORY_TRACT | Status: AC
  Filled 2024-12-16: qty 3

## 2024-12-16 MED ORDER — IPRATROPIUM-ALBUTEROL 0.5-2.5 (3) MG/3ML IN SOLN
3.0000 mL | RESPIRATORY_TRACT | Status: DC
  Administered 2024-12-16: 3 mL via RESPIRATORY_TRACT

## 2024-12-16 MED ORDER — GLUCAGON HCL RDNA (DIAGNOSTIC) 1 MG IJ SOLR: 1.0000 mg | INTRAMUSCULAR | Status: DC | PRN

## 2024-12-16 MED ORDER — LIDOCAINE HCL (CARDIAC) PF 100 MG/5ML IV SOSY
PREFILLED_SYRINGE | INTRAVENOUS | Status: DC | PRN
  Administered 2024-12-16: 50 mg via INTRAVENOUS

## 2024-12-16 MED ORDER — SODIUM CHLORIDE 0.9 % IV BOLUS
1000.0000 mL | Freq: Once | INTRAVENOUS | Status: AC
  Administered 2024-12-16: 1000 mL via INTRAVENOUS

## 2024-12-16 MED ORDER — ACETAMINOPHEN 325 MG PO TABS: 650.0000 mg | ORAL_TABLET | Freq: Four times a day (QID) | ORAL | Status: DC | PRN

## 2024-12-16 NOTE — Progress Notes (Signed)
 " PROGRESS NOTE    Sandra Glover  FMW:[id number] DOB: 05/28/1986 DOA: 12/12/2024 PCP: [name], FNP    Brief Narrative:   39 year old female with history of HTN, anxiety, IBS, GERD, chronic abdominal pain presents to the ED with hematuria and right lower abdominal pain.  Workup showed transaminitis, CT abdomen pelvis was unremarkable.  She was started on IV Rocephin  due to concerns of urinary tract infection.  Assessment & Plan:   Abd pain Transaminitis -Somewhat unclear of her chronic abdominal pain cause.  She has prior history of cholecystectomy.  CT abdomen pelvis and MRI has been unremarkable.  Previously her ANA was positive therefore further autoimmune workup has been ordered.  Negative hepatitis panel. -Plans for endoscopy today - Liver biopsy 1/9-   Hematuria Initial concerns of UTI therefore received IV Rocephin  but UA was negative therefore discontinued   Acute anemia:  - resolved.    Essential hypertension:  -Slowly resume.  IV as needed   GERD: Continue Protonix .   ADHD: Resume Adderall.   Anxiety/bipolar 1 disorder/depression:  -Continue Prozac , Adderall.  Xanax  as needed    Constipation:  Continue her home Colace, MiraLAX  and Linzess   DVT prophylaxis: SCDs Start: 12/12/24 2340   Code Status:   Code Status: Full Code Family Communication: Family is present at bedside Level of care: Med-Surg Status is: Inpatient   PT Follow up Recs:   Subjective: Still reporting of abdominal pain, awaiting her endoscopy today   Examination:  General exam: Appears calm and comfortable  Respiratory system: Clear to auscultation. Respiratory effort normal. Cardiovascular system: S1 & S2 heard, RRR. No JVD, murmurs, rubs, gallops or clicks. No pedal edema. Gastrointestinal system: Abdomen is nondistended, soft and nontender. No organomegaly or masses felt. Normal bowel sounds heard. Central nervous system: Alert and oriented. No focal neurological  deficits. Extremities: Symmetric 5 x 5 power. Skin: No rashes, lesions or ulcers Psychiatry: Judgement and insight appear normal. Mood & affect appropriate.                Diet Orders (From admission, onward)     Start     Ordered   12/16/24 0001  Diet NPO time specified Except for: Sips with Meds  Diet effective midnight       Question:  Except for  Answer:  Sips with Meds   12/15/24 1543            Objective: Vitals:   12/15/24 2102 12/16/24 0531 12/16/24 0841 12/16/24 1049  BP: 128/89 105/62 112/75 129/89  Pulse: 70 72  80  Resp: 20 16  12   Temp: 97.8 F (36.6 C) 97.8 F (36.6 C)  98.5 F (36.9 C)  TempSrc: Oral Oral  Oral  SpO2: 97% 94%  97%  Weight:    74.8 kg  Height:    5' 3 (1.6 m)    Intake/Output Summary (Last 24 hours) at 12/16/2024 1111 Last data filed at 12/15/2024 2000 Gross per 24 hour  Intake 240 ml  Output --  Net 240 ml   Filed Weights   12/12/24 1923 12/16/24 1049  Weight: 74.8 kg 74.8 kg    Scheduled Meds:  [MAR Hold] amphetamine -dextroamphetamine   20 mg Oral BID   [MAR Hold] docusate sodium   100 mg Oral Daily   [MAR Hold] FLUoxetine   40 mg Oral Daily   [MAR Hold] linaclotide   290 mcg Oral QAC breakfast   [MAR Hold] pantoprazole   40 mg Oral Daily   [MAR Hold] polyethylene glycol  17 g Oral BID   [MAR Hold] simethicone   80 mg Oral TID AC   Continuous Infusions:  [MAR Hold] sodium chloride       Nutritional status     Body mass index is 29.23 kg/m.  Data Reviewed:   CBC: Recent Labs  Lab 12/12/24 1947 12/13/24 0359 12/14/24 0535 12/15/24 0537 12/16/24 0440  WBC 10.9* 8.2 7.5 7.6 7.4  NEUTROABS 7.4  --  4.4  --   --   HGB 12.5 11.1* 12.7 12.0 11.8*  HCT 37.3 32.9* 37.7 35.0* 35.6*  MCV 96.6 97.3 99.5 98.9 98.9  PLT 272 231 248 237 257   Basic Metabolic Panel: Recent Labs  Lab 12/12/24 1947 12/13/24 0359 12/14/24 0535 12/15/24 0537 12/16/24 0440  NA 136 138 140 139 139  K 4.0 4.1 4.1 3.9 3.9  CL 99  103 104 104 103  CO2 21* 27 28 28 30   GLUCOSE 73 93 92 97 92  BUN 6 7 8 7 7   CREATININE 0.88 0.86 0.75 0.81 0.82  CALCIUM 9.2 8.1* 8.6* 8.6* 8.8*  MG  --  2.1  --   --   --   PHOS  --  3.4  --   --   --    GFR: Estimated Creatinine Clearance: 91.9 mL/min (by C-G formula based on SCr of 0.82 mg/dL). Liver Function Tests: Recent Labs  Lab 12/12/24 1947 12/13/24 0359 12/14/24 0535 12/15/24 0537 12/16/24 0440  AST 495* 232* 94* 64* 62*  ALT 393* 289* 204* 155* 138*  ALKPHOS 206* 173* 182* 194* 178*  BILITOT 0.4 0.5 0.3 0.2 0.3  PROT 7.2 5.8* 6.0* 6.0* 6.1*  ALBUMIN 4.6 3.9 3.8 3.8 3.8   Recent Labs  Lab 12/10/24 2037  LIPASE 50   No results for input(s): AMMONIA in the last 168 hours. Coagulation Profile: Recent Labs  Lab 12/12/24 1947 12/13/24 0359  INR 0.9 1.0   Cardiac Enzymes: No results for input(s): CKTOTAL, CKMB, CKMBINDEX, TROPONINI in the last 168 hours. BNP (last 3 results) No results for input(s): PROBNP in the last 8760 hours. HbA1C: No results for input(s): HGBA1C in the last 72 hours. CBG: No results for input(s): GLUCAP in the last 168 hours. Lipid Profile: No results for input(s): CHOL, HDL, LDLCALC, TRIG, CHOLHDL, LDLDIRECT in the last 72 hours. Thyroid  Function Tests: No results for input(s): TSH, T4TOTAL, FREET4, T3FREE, THYROIDAB in the last 72 hours. Anemia Panel: No results for input(s): VITAMINB12, FOLATE, FERRITIN, TIBC, IRON, RETICCTPCT in the last 72 hours. Sepsis Labs: Recent Labs  Lab 12/12/24 1947  LATICACIDVEN 1.7    Recent Results (from the past 240 hours)  Culture, blood (Routine x 2)     Status: None (Preliminary result)   Collection Time: 12/12/24  7:47 PM   Specimen: BLOOD  Result Value Ref Range Status   Specimen Description BLOOD BLOOD LEFT ARM  Final   Special Requests   Final    BOTTLES DRAWN AEROBIC AND ANAEROBIC Blood Culture adequate volume   Culture   Final     NO GROWTH 4 DAYS Performed at [facility], [address], [place], [place] [address]    Report Status PENDING  Incomplete  Culture, blood (Routine x 2)     Status: None (Preliminary result)   Collection Time: 12/12/24  7:56 PM   Specimen: BLOOD RIGHT HAND  Result Value Ref Range Status   Specimen Description BLOOD RIGHT HAND  Final   Special Requests   Final  BOTTLES DRAWN AEROBIC AND ANAEROBIC Blood Culture adequate volume   Culture   Final    NO GROWTH 4 DAYS Performed at [facility], [address] [place], [place] [address]    Report Status PENDING  Incomplete         Radiology Studies: DG Abd 1 View Result Date: 12/15/2024 EXAM: 1 VIEW XRAY OF THE ABDOMEN 12/15/2024 10:11:00 AM COMPARISON: CT abdomen and pelvis 12/12/2024. CLINICAL HISTORY: 39 year old female with abdominal pain. FINDINGS: BOWEL: Volume of bowel gas has increased from the recent CT, but the pattern is nonobstructed. SOFT TISSUES: Surgical clips in right upper quadrant. No abnormal calcifications. BONES: No acute fracture. IMPRESSION: 1. Non-obstructed bowel gas pattern.  No acute radiographic finding. Electronically signed by: Helayne Hurst MD MD 12/15/2024 10:34 AM EST RP Workstation: [id number]           LOS: 4 days   Time spent= 35 mins    Burgess JAYSON Dare, MD [facility]  If 7PM-7AM, please contact night-coverage  12/16/2024, 11:11 AM  "

## 2024-12-16 NOTE — Anesthesia Preprocedure Evaluation (Signed)
"                                    Anesthesia Evaluation  Patient identified by MRN, date of birth, ID band Patient awake    Reviewed: Allergy & Precautions, H&P , NPO status , Patient's Chart, lab work & pertinent test results, reviewed documented beta blocker date and time   Airway Mallampati: II  TM Distance: >3 FB Neck ROM: full    Dental no notable dental hx.    Pulmonary neg pulmonary ROS   Pulmonary exam normal breath sounds clear to auscultation       Cardiovascular Exercise Tolerance: Good hypertension,  Rhythm:regular Rate:Normal     Neuro/Psych  Headaches PSYCHIATRIC DISORDERS Anxiety Depression Bipolar Disorder    Neuromuscular disease    GI/Hepatic Neg liver ROS, hiatal hernia,GERD  ,,  Endo/Other  negative endocrine ROS    Renal/GU negative Renal ROS  negative genitourinary   Musculoskeletal   Abdominal   Peds  Hematology negative hematology ROS (+)   Anesthesia Other Findings   Reproductive/Obstetrics negative OB ROS                              Anesthesia Physical Anesthesia Plan  ASA: 2 and emergent  Anesthesia Plan: MAC   Post-op Pain Management:    Induction:   PONV Risk Score and Plan: Propofol  infusion  Airway Management Planned:   Additional Equipment:   Intra-op Plan:   Post-operative Plan:   Informed Consent: I have reviewed the patients History and Physical, chart, labs and discussed the procedure including the risks, benefits and alternatives for the proposed anesthesia with the patient or authorized representative who has indicated his/her understanding and acceptance.     Dental Advisory Given  Plan Discussed with: CRNA  Anesthesia Plan Comments:         Anesthesia Quick Evaluation  "

## 2024-12-16 NOTE — Plan of Care (Signed)
" °  Problem: Education: Goal: Knowledge of General Education information will improve Description: Including pain rating scale, medication(s)/side effects and non-pharmacologic comfort measures 12/16/2024 0036 by Olene Corean CROME, RN Outcome: Progressing 12/16/2024 0036 by Olene Corean CROME, RN Outcome: Progressing   Problem: Health Behavior/Discharge Planning: Goal: Ability to manage health-related needs will improve 12/16/2024 0036 by Olene Corean CROME, RN Outcome: Progressing 12/16/2024 0036 by Olene Corean CROME, RN Outcome: Progressing   Problem: Clinical Measurements: Goal: Ability to maintain clinical measurements within normal limits will improve 12/16/2024 0036 by Olene Corean CROME, RN Outcome: Progressing 12/16/2024 0036 by Olene Corean CROME, RN Outcome: Progressing Goal: Will remain free from infection 12/16/2024 0036 by Olene Corean CROME, RN Outcome: Progressing 12/16/2024 0036 by Olene Corean CROME, RN Outcome: Progressing Goal: Diagnostic test results will improve 12/16/2024 0036 by Olene Corean CROME, RN Outcome: Progressing 12/16/2024 0036 by Olene Corean CROME, RN Outcome: Progressing Goal: Respiratory complications will improve 12/16/2024 0036 by Olene Corean CROME, RN Outcome: Progressing 12/16/2024 0036 by Olene Corean CROME, RN Outcome: Progressing Goal: Cardiovascular complication will be avoided 12/16/2024 0036 by Olene Corean CROME, RN Outcome: Progressing 12/16/2024 0036 by Olene Corean CROME, RN Outcome: Progressing   Problem: Activity: Goal: Risk for activity intolerance will decrease 12/16/2024 0036 by Olene Corean CROME, RN Outcome: Progressing 12/16/2024 0036 by Olene Corean CROME, RN Outcome: Progressing   Problem: Nutrition: Goal: Adequate nutrition will be maintained 12/16/2024 0036 by Olene Corean CROME, RN Outcome: Progressing 12/16/2024 0036 by Olene Corean CROME,  RN Outcome: Progressing   Problem: Coping: Goal: Level of anxiety will decrease 12/16/2024 0036 by Olene Corean CROME, RN Outcome: Progressing 12/16/2024 0036 by Olene Corean CROME, RN Outcome: Progressing   Problem: Elimination: Goal: Will not experience complications related to bowel motility 12/16/2024 0036 by Olene Corean CROME, RN Outcome: Progressing 12/16/2024 0036 by Olene Corean CROME, RN Outcome: Progressing Goal: Will not experience complications related to urinary retention 12/16/2024 0036 by Olene Corean CROME, RN Outcome: Progressing 12/16/2024 0036 by Olene Corean CROME, RN Outcome: Progressing   Problem: Pain Managment: Goal: General experience of comfort will improve and/or be controlled 12/16/2024 0036 by Olene Corean CROME, RN Outcome: Progressing 12/16/2024 0036 by Olene Corean CROME, RN Outcome: Progressing   Problem: Safety: Goal: Ability to remain free from injury will improve 12/16/2024 0036 by Olene Corean CROME, RN Outcome: Progressing 12/16/2024 0036 by Olene Corean CROME, RN Outcome: Progressing   Problem: Skin Integrity: Goal: Risk for impaired skin integrity will decrease 12/16/2024 0036 by Olene Corean CROME, RN Outcome: Progressing 12/16/2024 0036 by Olene Corean CROME, RN Outcome: Progressing   "

## 2024-12-16 NOTE — Progress Notes (Signed)
 Pt has been NPO since midnight. Pt has only required x1 dose of Dilaudid  for pain and x1 dose of oxycodone  for pain. Zofran  given x1 for nausea without vomiting. Pt awaits to have EGD done today. Pt has been in bed most of the shift other than when she gets up to go to the bathroom. Guards stomach with both hands and has facial grimacing from pain almost all the time. Pt hopes to find out answers to why she is having abdominal pain like this after her procedure today. Resting in bed with call light within reach. CHG wipes have been provided to the patient along with instructions for bathing with them this am prior to procedure. Clean gown provided also. Pt stated she would proceed to perform CHG bath at this time.

## 2024-12-16 NOTE — Progress Notes (Signed)
 " PROGRESS NOTE    Sandra Glover  FMW:[id number] DOB: 1986/05/08 DOA: 12/12/2024 PCP: [name], FNP    Brief Narrative:   39 year old female with history of HTN, anxiety, IBS, GERD, chronic abdominal pain presents to the ED with hematuria and right lower abdominal pain.  Workup showed transaminitis, CT abdomen pelvis was unremarkable.  She was started on IV Rocephin  due to concerns of urinary tract infection.  Assessment & Plan:   Abd pain Transaminitis -Somewhat unclear of her chronic abdominal pain cause.  She has prior history of cholecystectomy.  CT abdomen pelvis and MRI has been unremarkable.  Previously her ANA was positive therefore further autoimmune workup has been ordered.  Negative hepatitis panel. -Plans for endoscopy today - Liver biopsy 1/9-   Hematuria, resolved Initial concerns of UTI therefore received IV Rocephin  but UA was negative therefore discontinued   Acute anemia:  - resolved.    Essential hypertension:  -Slowly resume.  IV as needed   GERD: Continue Protonix .   ADHD: Resume Adderall.   Anxiety/bipolar 1 disorder/depression:  -Continue Prozac , Adderall.  Xanax  as needed    Constipation:  Continue her home Colace, MiraLAX  and Linzess   DVT prophylaxis: SCDs Start: 12/12/24 2340   Code Status:   Code Status: Full Code Family Communication: Family is present at bedside Level of care: Med-Surg Status is: Inpatient   PT Follow up Recs:   Subjective: Still reporting of abdominal pain, awaiting her endoscopy today   Examination:  General exam: Appears calm and comfortable  Respiratory system: Clear to auscultation. Respiratory effort normal. Cardiovascular system: S1 & S2 heard, RRR. No JVD, murmurs, rubs, gallops or clicks. No pedal edema. Gastrointestinal system: Abdomen is nondistended, soft and nontender. No organomegaly or masses felt. Normal bowel sounds heard. Central nervous system: Alert and oriented. No focal  neurological deficits. Extremities: Symmetric 5 x 5 power. Skin: No rashes, lesions or ulcers Psychiatry: Judgement and insight appear normal. Mood & affect appropriate.                Diet Orders (From admission, onward)     Start     Ordered   12/16/24 0001  Diet NPO time specified Except for: Sips with Meds  Diet effective midnight       Question:  Except for  Answer:  Sips with Meds   12/15/24 1543            Objective: Vitals:   12/15/24 2102 12/16/24 0531 12/16/24 0841 12/16/24 1049  BP: 128/89 105/62 112/75 129/89  Pulse: 70 72  80  Resp: 20 16  12   Temp: 97.8 F (36.6 C) 97.8 F (36.6 C)  98.5 F (36.9 C)  TempSrc: Oral Oral  Oral  SpO2: 97% 94%  97%  Weight:    74.8 kg  Height:    5' 3 (1.6 m)    Intake/Output Summary (Last 24 hours) at 12/16/2024 1111 Last data filed at 12/15/2024 2000 Gross per 24 hour  Intake 240 ml  Output --  Net 240 ml   Filed Weights   12/12/24 1923 12/16/24 1049  Weight: 74.8 kg 74.8 kg    Scheduled Meds:  [MAR Hold] amphetamine -dextroamphetamine   20 mg Oral BID   [MAR Hold] docusate sodium   100 mg Oral Daily   [MAR Hold] FLUoxetine   40 mg Oral Daily   [MAR Hold] linaclotide   290 mcg Oral QAC breakfast   [MAR Hold] pantoprazole   40 mg Oral Daily   [MAR Hold] polyethylene glycol  17 g Oral BID   [MAR Hold] simethicone   80 mg Oral TID AC   Continuous Infusions:  [MAR Hold] sodium chloride       Nutritional status     Body mass index is 29.23 kg/m.  Data Reviewed:   CBC: Recent Labs  Lab 12/12/24 1947 12/13/24 0359 12/14/24 0535 12/15/24 0537 12/16/24 0440  WBC 10.9* 8.2 7.5 7.6 7.4  NEUTROABS 7.4  --  4.4  --   --   HGB 12.5 11.1* 12.7 12.0 11.8*  HCT 37.3 32.9* 37.7 35.0* 35.6*  MCV 96.6 97.3 99.5 98.9 98.9  PLT 272 231 248 237 257   Basic Metabolic Panel: Recent Labs  Lab 12/12/24 1947 12/13/24 0359 12/14/24 0535 12/15/24 0537 12/16/24 0440  NA 136 138 140 139 139  K 4.0 4.1 4.1 3.9  3.9  CL 99 103 104 104 103  CO2 21* 27 28 28 30   GLUCOSE 73 93 92 97 92  BUN 6 7 8 7 7   CREATININE 0.88 0.86 0.75 0.81 0.82  CALCIUM 9.2 8.1* 8.6* 8.6* 8.8*  MG  --  2.1  --   --   --   PHOS  --  3.4  --   --   --    GFR: Estimated Creatinine Clearance: 91.9 mL/min (by C-G formula based on SCr of 0.82 mg/dL). Liver Function Tests: Recent Labs  Lab 12/12/24 1947 12/13/24 0359 12/14/24 0535 12/15/24 0537 12/16/24 0440  AST 495* 232* 94* 64* 62*  ALT 393* 289* 204* 155* 138*  ALKPHOS 206* 173* 182* 194* 178*  BILITOT 0.4 0.5 0.3 0.2 0.3  PROT 7.2 5.8* 6.0* 6.0* 6.1*  ALBUMIN 4.6 3.9 3.8 3.8 3.8   Recent Labs  Lab 12/10/24 2037  LIPASE 50   No results for input(s): AMMONIA in the last 168 hours. Coagulation Profile: Recent Labs  Lab 12/12/24 1947 12/13/24 0359  INR 0.9 1.0   Cardiac Enzymes: No results for input(s): CKTOTAL, CKMB, CKMBINDEX, TROPONINI in the last 168 hours. BNP (last 3 results) No results for input(s): PROBNP in the last 8760 hours. HbA1C: No results for input(s): HGBA1C in the last 72 hours. CBG: No results for input(s): GLUCAP in the last 168 hours. Lipid Profile: No results for input(s): CHOL, HDL, LDLCALC, TRIG, CHOLHDL, LDLDIRECT in the last 72 hours. Thyroid  Function Tests: No results for input(s): TSH, T4TOTAL, FREET4, T3FREE, THYROIDAB in the last 72 hours. Anemia Panel: No results for input(s): VITAMINB12, FOLATE, FERRITIN, TIBC, IRON, RETICCTPCT in the last 72 hours. Sepsis Labs: Recent Labs  Lab 12/12/24 1947  LATICACIDVEN 1.7    Recent Results (from the past 240 hours)  Culture, blood (Routine x 2)     Status: None (Preliminary result)   Collection Time: 12/12/24  7:47 PM   Specimen: BLOOD  Result Value Ref Range Status   Specimen Description BLOOD BLOOD LEFT ARM  Final   Special Requests   Final    BOTTLES DRAWN AEROBIC AND ANAEROBIC Blood Culture adequate volume   Culture    Final    NO GROWTH 4 DAYS Performed at [facility], [address] [place], [place] [address]    Report Status PENDING  Incomplete  Culture, blood (Routine x 2)     Status: None (Preliminary result)   Collection Time: 12/12/24  7:56 PM   Specimen: BLOOD RIGHT HAND  Result Value Ref Range Status   Specimen Description BLOOD RIGHT HAND  Final   Special Requests   Final  BOTTLES DRAWN AEROBIC AND ANAEROBIC Blood Culture adequate volume   Culture   Final    NO GROWTH 4 DAYS Performed at [facility], [address] [place], [place] [address]    Report Status PENDING  Incomplete         Radiology Studies: DG Abd 1 View Result Date: 12/15/2024 EXAM: 1 VIEW XRAY OF THE ABDOMEN 12/15/2024 10:11:00 AM COMPARISON: CT abdomen and pelvis 12/12/2024. CLINICAL HISTORY: 39 year old female with abdominal pain. FINDINGS: BOWEL: Volume of bowel gas has increased from the recent CT, but the pattern is nonobstructed. SOFT TISSUES: Surgical clips in right upper quadrant. No abnormal calcifications. BONES: No acute fracture. IMPRESSION: 1. Non-obstructed bowel gas pattern.  No acute radiographic finding. Electronically signed by: Helayne Hurst MD MD 12/15/2024 10:34 AM EST RP Workstation: [id number]           LOS: 4 days   Time spent= 35 mins    Burgess JAYSON Dare, MD [facility]  If 7PM-7AM, please contact night-coverage  12/16/2024, 11:11 AM  "

## 2024-12-16 NOTE — Progress Notes (Signed)
" ° ° °  PROCEDURAL EXPEDITER PROGRESS NOTE  Patient Name: Sandra Glover  DOB:04-29-1986 Date of Admission: 12/12/2024  Date of Assessment:12/16/2024   -------------------------------------------------------------------------------------------------------------------   Brief clinical summary: 39 yr old having EGD today   Orders in place:  Yes   Communication with surgical team if no orders: n/a  Labs, test, and orders reviewed: Y  Requires surgical clearance:  No  What type of clearance: n/a  Clearance received: n/a  Barriers noted:n/a   Intervention provided by [facility] team: n/a  Barrier resolved:  not applicable   -------------------------------------------------------------------------------------------------------------------  [employer], Ronal DELENA Bald Please contact us  directly via secure chat (search for [facility]) or by calling us  at [phone] [facility]).  "

## 2024-12-16 NOTE — Interval H&P Note (Signed)
 History and Physical Interval Note:  12/16/2024 11:01 AM  Sandra Glover Nose  has presented today for surgery, with the diagnosis of Abdominal pain.  The various methods of treatment have been discussed with the patient and family. After consideration of risks, benefits and other options for treatment, the patient has consented to  Procedures: EGD (ESOPHAGOGASTRODUODENOSCOPY) (N/A) as a surgical intervention.  The patient's history has been reviewed, patient examined, no change in status, stable for surgery.  I have reviewed the patient's chart and labs.  Questions were answered to the patient's satisfaction.     Lamar Gaia Gullikson   seen in short stay.  Downward trend in LFTs noted.  Liver biopsy 3 days ago.  Here for EGD for epigastric and right upper quadrant abdominal pain.  Patient endorses intermittent esophageal dysphagia.  EGD today with possible esophageal dilation is feasible/appropriate per plan.  The risks, benefits, limitations, alternatives and imponderables have been reviewed with the patient. Potential for esophageal dilation, biopsy, etc. have also been reviewed.  Questions have been answered. All parties agreeable.

## 2024-12-16 NOTE — Transfer of Care (Signed)
 Immediate Anesthesia Transfer of Care Note  Patient: Sandra Glover  Procedure(s) Performed: EGD (ESOPHAGOGASTRODUODENOSCOPY)  Patient Location: PACU  Anesthesia Type:MAC  Level of Consciousness: drowsy, patient cooperative, and responds to stimulation  Airway & Oxygen Therapy: Patient Spontanous Breathing and Patient connected to nasal cannula oxygen  Post-op Assessment: Report given to RN and Post -op Vital signs reviewed and stable  Post vital signs: stable  Last Vitals:  Vitals Value Taken Time  BP 124/74   Temp 98.4   Pulse 95 12/16/24 11:33  Resp 18   SpO2 97 % 12/16/24 11:33  Vitals shown include unfiled device data.  Last Pain:  Vitals:   12/16/24 1109  TempSrc:   PainSc: 5       Patients Stated Pain Goal: 6 (12/16/24 1049)  Complications: No notable events documented.

## 2024-12-16 NOTE — Hospital Course (Addendum)
 Brief Narrative:   39 year old female with history of HTN, anxiety, IBS, GERD, chronic abdominal pain presents to the ED with hematuria and right lower abdominal pain.  Workup showed transaminitis, CT abdomen pelvis was unremarkable.  She was started on IV Rocephin  due to concerns of urinary tract infection.  Assessment & Plan:   Abd pain Transaminitis -Somewhat unclear of her chronic abdominal pain cause.  She has prior history of cholecystectomy.  CT abdomen pelvis and MRI has been unremarkable.  Previously her ANA was positive therefore further autoimmune workup has been ordered.  Negative hepatitis panel. -Plans for endoscopy today - Liver biopsy 1/9-   Hematuria, resolved Initial concerns of UTI therefore received IV Rocephin  but UA was negative therefore discontinued   Acute anemia:  - resolved.    Essential hypertension:  -Slowly resume.  IV as needed   GERD: Continue Protonix .   ADHD: Resume Adderall.   Anxiety/bipolar 1 disorder/depression:  -Continue Prozac , Adderall.  Xanax  as needed    Constipation:  Continue her home Colace, MiraLAX  and Linzess   DVT prophylaxis: SCDs Start: 12/12/24 2340   Code Status:   Code Status: Full Code Family Communication: Family is present at bedside Level of care: Med-Surg Status is: Inpatient   PT Follow up Recs:   Subjective: Still reporting of abdominal pain, awaiting her endoscopy today   Examination:  General exam: Appears calm and comfortable  Respiratory system: Clear to auscultation. Respiratory effort normal. Cardiovascular system: S1 & S2 heard, RRR. No JVD, murmurs, rubs, gallops or clicks. No pedal edema. Gastrointestinal system: Abdomen is nondistended, soft and nontender. No organomegaly or masses felt. Normal bowel sounds heard. Central nervous system: Alert and oriented. No focal neurological deficits. Extremities: Symmetric 5 x 5 power. Skin: No rashes, lesions or ulcers Psychiatry: Judgement and  insight appear normal. Mood & affect appropriate.

## 2024-12-16 NOTE — Op Note (Signed)
 [facility] Patient Name: Sandra Glover Procedure Date: 12/16/2024 10:50 AM MRN: [id number] Date of Birth: [birth year] Attending MD: Sandra Glover , MD, [id number] CSN: [id number] Age: 39 Admit Type: Inpatient Procedure:                Upper GI endoscopy Indications:              Epigastric abdominal pain, Abdominal pain in the                            right upper quadrant Providers:                Sandra Ozell Hollingshead, MD, Rosina Sprague, Devere Lodge,                            Daphne Mulch Technician, Technician Referring MD:              Medicines:                Propofol  per Anesthesia Complications:            No immediate complications. Estimated Blood Loss:     Estimated blood loss was minimal. Procedure:                Pre-Anesthesia Assessment:                           - Prior to the procedure, a History and Physical                            was performed, and patient medications and                            allergies were reviewed. The patient's tolerance of                            previous anesthesia was also reviewed. The risks                            and benefits of the procedure and the sedation                            options and risks were discussed with the patient.                            All questions were answered, and informed consent                            was obtained. Prior Anticoagulants: The patient has                            taken no anticoagulant or antiplatelet agents. ASA                            Grade Assessment: III - A patient with severe  systemic disease. After reviewing the risks and                            benefits, the patient was deemed in satisfactory                            condition to undergo the procedure.                           After obtaining informed consent, the endoscope was                            passed under direct vision. Throughout the                             procedure, the patient's blood pressure, pulse, and                            oxygen saturations were monitored continuously. The                            [id number] ([id number]) Upper was introduced through                            the mouth, and advanced to the second part of                            duodenum. The upper GI endoscopy was accomplished                            without difficulty. The patient tolerated the                            procedure well. Scope In: 11:15:15 AM Scope Out: 11:23:18 AM Total Procedure Duration: 0 hours 8 minutes 3 seconds  Findings:      The examined esophagus was normal.      A small hiatal hernia was present. Antral erosions with 2 almost       completely healed prepyloric ulcers. The remainder of the gastric       mucosa. Normal pylorus patent. Examination the bulb and proximal D2       revealed areas of denuded duodenal mucosa. Please see photos.      Biopsies of the abnormal antrum and duodenum taken for histologic study. Impression:               - Normal esophagus.                           - Small hiatal hernia. Antral erosions with 2 areas                            of healing ulceration?status post biopsy                           - Denuded areas of duodenal bulb and second  portion?status post biopsy Moderate Sedation:      Moderate (conscious) sedation was personally administered by an       anesthesia professional. The following parameters were monitored: oxygen       saturation, heart rate, blood pressure, respiratory rate, EKG, adequacy       of pulmonary ventilation, and response to care. Recommendation:           - Clear liquids for lunch; advance as tolerated.                            Twice daily PPI. Follow-up on pathology. Liver                            biopsy results will likely be out no sooner than                            tomorrow. Follow-up on pending serologies. At                             patient request, I called Sandra Glover at                            [phone]?reviewed findings and recommendations.                            Husband states patient does not like taking pain                            medication is not taking any NSAIDs at home.                            Further recommendations to follow.                           - Return patient to hospital ward for ongoing care. Procedure Code(s):        --- Professional ---                           [phone], Esophagogastroduodenoscopy, flexible,                            transoral; diagnostic, including collection of                            specimen(s) by brushing or washing, when performed                            (separate procedure) Diagnosis Code(s):        --- Professional ---                           K44.9, Diaphragmatic hernia without obstruction or                            gangrene  R10.13, Epigastric pain                           R10.11, Right upper quadrant pain CPT copyright 2022 [employer]. All rights reserved. The codes documented in this report are preliminary and upon coder review may  be revised to meet current compliance requirements. Sandra Glover. Sandra Hipps, MD Sandra Ozell Hollingshead, MD 12/16/2024 11:37:44 AM This report has been signed electronically. Number of Addenda: 0

## 2024-12-16 NOTE — Discharge Instructions (Signed)
 [email or web address] ( Food EBT/SNAP)

## 2024-12-17 ENCOUNTER — Encounter (HOSPITAL_COMMUNITY): Payer: Self-pay | Admitting: Internal Medicine

## 2024-12-17 ENCOUNTER — Encounter: Payer: Self-pay | Admitting: Gastroenterology

## 2024-12-17 ENCOUNTER — Inpatient Hospital Stay (HOSPITAL_COMMUNITY)

## 2024-12-17 DIAGNOSIS — K625 Hemorrhage of anus and rectum: Secondary | ICD-10-CM | POA: Diagnosis not present

## 2024-12-17 DIAGNOSIS — R1013 Epigastric pain: Secondary | ICD-10-CM | POA: Diagnosis not present

## 2024-12-17 DIAGNOSIS — K5904 Chronic idiopathic constipation: Secondary | ICD-10-CM | POA: Diagnosis not present

## 2024-12-17 DIAGNOSIS — R7401 Elevation of levels of liver transaminase levels: Secondary | ICD-10-CM | POA: Diagnosis not present

## 2024-12-17 LAB — COMPREHENSIVE METABOLIC PANEL WITH GFR
ALT: 171 U/L — ABNORMAL HIGH (ref 0–44)
AST: 134 U/L — ABNORMAL HIGH (ref 15–41)
Albumin: 3.7 g/dL (ref 3.5–5.0)
Alkaline Phosphatase: 168 U/L — ABNORMAL HIGH (ref 38–126)
Anion gap: 6 (ref 5–15)
BUN: 6 mg/dL (ref 6–20)
CO2: 29 mmol/L (ref 22–32)
Calcium: 8.6 mg/dL — ABNORMAL LOW (ref 8.9–10.3)
Chloride: 106 mmol/L (ref 98–111)
Creatinine, Ser: 0.73 mg/dL (ref 0.44–1.00)
GFR, Estimated: >60 mL/min
Glucose, Bld: 84 mg/dL (ref 70–99)
Potassium: 4.3 mmol/L (ref 3.5–5.1)
Sodium: 140 mmol/L (ref 135–145)
Total Bilirubin: 0.3 mg/dL (ref 0.0–1.2)
Total Protein: 5.8 g/dL — ABNORMAL LOW (ref 6.5–8.1)

## 2024-12-17 LAB — SURGICAL PATHOLOGY

## 2024-12-17 LAB — CULTURE, BLOOD (ROUTINE X 2)
Culture: NO GROWTH
Culture: NO GROWTH
Special Requests: ADEQUATE
Special Requests: ADEQUATE

## 2024-12-17 MED ORDER — IOHEXOL 300 MG/ML  SOLN
75.0000 mL | Freq: Once | INTRAMUSCULAR | Status: AC | PRN
  Administered 2024-12-17: 75 mL via INTRAVENOUS

## 2024-12-17 MED ORDER — POLYETHYLENE GLYCOL 3350 17 G PO PACK
17.0000 g | PACK | Freq: Three times a day (TID) | ORAL | Status: DC
  Administered 2024-12-17 – 2024-12-21 (×11): 17 g via ORAL
  Filled 2024-12-17 (×11): qty 1

## 2024-12-17 MED ORDER — SUCRALFATE 1 GM/10ML PO SUSP
1.0000 g | Freq: Three times a day (TID) | ORAL | Status: DC
  Administered 2024-12-17 – 2024-12-21 (×17): 1 g via ORAL
  Filled 2024-12-17 (×17): qty 10

## 2024-12-17 MED ORDER — PANTOPRAZOLE SODIUM 40 MG PO TBEC
40.0000 mg | DELAYED_RELEASE_TABLET | Freq: Two times a day (BID) | ORAL | Status: DC
  Administered 2024-12-17 – 2024-12-21 (×8): 40 mg via ORAL
  Filled 2024-12-17 (×8): qty 1

## 2024-12-17 NOTE — Progress Notes (Signed)
 " PROGRESS NOTE    Sandra Glover  FMW:[id number] DOB: [birth year] DOA: 12/12/2024 PCP: [name], FNP    Brief Narrative:   39 year old female with history of HTN, anxiety, IBS, GERD, chronic abdominal pain presents to the ED with hematuria and right lower abdominal pain.  Workup showed transaminitis, CT abdomen pelvis was unremarkable.  She was started on IV Rocephin  due to concerns of urinary tract infection.  GI team was consulted.  MRCP was unremarkable.  EGD on 1/12 showed normal esophagus, small hiatal hernia and antral healing erosion/ulcer.  Assessment & Plan:   Acute on chronic abd pain Transaminitis -Somewhat unclear of her chronic abdominal pain cause.  She has prior history of cholecystectomy.  CT abdomen pelvis and MRI has been unremarkable.  Previously her ANA was positive therefore further autoimmune workup has been ordered.  Negative hepatitis panel. - EGD 1/12 showed normal esophagus, small hiatal hernia, antral healing erosion/ulcer.  Biopsies done - PPI twice daily - Liver biopsy 1/9- pending.    Hematuria, resolved Initial concerns of UTI therefore received IV Rocephin  but UA was negative therefore discontinued  Left-sided neck pain - Tender to touch, unclear etiology.  Possible lymphadenopathy?.  Does have poor dentition according to her.  Will order CT neck   Acute anemia:  - resolved.    Essential hypertension:  -Slowly resume.  IV as needed   GERD: Continue Protonix .   ADHD: Resume Adderall.   Anxiety/bipolar 1 disorder/depression:  -Continue Prozac , Adderall.  Xanax  as needed    Constipation:  Continue her home Colace, MiraLAX  and Linzess   DVT prophylaxis: SCDs Start: 12/12/24 2340   Code Status:   Code Status: Full Code Family Communication: Family is present at bedside Level of care: Med-Surg. DC once cleared by GI and she is able to tolerate PO Status is: Inpatient   PT Follow up Recs:   Subjective:  Reporting of left-sided  neck pain Tolerated oral liquid diet  Examination:  General exam: Appears calm and comfortable  Respiratory system: Clear to auscultation. Respiratory effort normal. Cardiovascular system: S1 & S2 heard, RRR. No JVD, murmurs, rubs, gallops or clicks. No pedal edema. Gastrointestinal system: Abdomen is nondistended, soft and nontender. No organomegaly or masses felt. Normal bowel sounds heard. Central nervous system: Alert and oriented. No focal neurological deficits. Extremities: Symmetric 5 x 5 power. Skin: No rashes, lesions or ulcers Psychiatry: Judgement and insight appear normal. Mood & affect appropriate.                Diet Orders (From admission, onward)     Start     Ordered   12/16/24 1647  Diet full liquid Room service appropriate? Yes; Fluid consistency: Thin  Diet effective now       Question Answer Comment  Room service appropriate? Yes   Fluid consistency: Thin      12/16/24 1647            Objective: Vitals:   12/16/24 1200 12/16/24 1240 12/16/24 1940 12/17/24 0150  BP: 125/88 119/81 102/62 108/66  Pulse: (!) 106 98 68 64  Resp: (!) 21 18 20 20   Temp: 98 F (36.7 C) 98.4 F (36.9 C) 98.4 F (36.9 C) 98 F (36.7 C)  TempSrc:  Oral Oral Oral  SpO2: 96% 94% 94% 97%  Weight:      Height:       No intake or output data in the 24 hours ending 12/17/24 1142 Filed Weights   12/12/24 1923 12/16/24  1049  Weight: 74.8 kg 74.8 kg    Scheduled Meds:  amphetamine -dextroamphetamine   20 mg Oral BID   docusate sodium   100 mg Oral Daily   FLUoxetine   40 mg Oral Daily   linaclotide   290 mcg Oral QAC breakfast   pantoprazole   40 mg Oral BID AC   polyethylene glycol  17 g Oral TID   simethicone   80 mg Oral TID AC   sucralfate   1 g Oral TID WC & HS   Continuous Infusions:  Nutritional status     Body mass index is 29.23 kg/m.  Data Reviewed:   CBC: Recent Labs  Lab 12/12/24 1947 12/13/24 0359 12/14/24 0535 12/15/24 0537  12/16/24 0440  WBC 10.9* 8.2 7.5 7.6 7.4  NEUTROABS 7.4  --  4.4  --   --   HGB 12.5 11.1* 12.7 12.0 11.8*  HCT 37.3 32.9* 37.7 35.0* 35.6*  MCV 96.6 97.3 99.5 98.9 98.9  PLT 272 231 248 237 257   Basic Metabolic Panel: Recent Labs  Lab 12/13/24 0359 12/14/24 0535 12/15/24 0537 12/16/24 0440 12/17/24 0518  NA 138 140 139 139 140  K 4.1 4.1 3.9 3.9 4.3  CL 103 104 104 103 106  CO2 27 28 28 30 29   GLUCOSE 93 92 97 92 84  BUN 7 8 7 7 6   CREATININE 0.86 0.75 0.81 0.82 0.73  CALCIUM 8.1* 8.6* 8.6* 8.8* 8.6*  MG 2.1  --   --   --   --   PHOS 3.4  --   --   --   --    GFR: Estimated Creatinine Clearance: 94.2 mL/min (by C-G formula based on SCr of 0.73 mg/dL). Liver Function Tests: Recent Labs  Lab 12/13/24 0359 12/14/24 0535 12/15/24 0537 12/16/24 0440 12/17/24 0518  AST 232* 94* 64* 62* 134*  ALT 289* 204* 155* 138* 171*  ALKPHOS 173* 182* 194* 178* 168*  BILITOT 0.5 0.3 0.2 0.3 0.3  PROT 5.8* 6.0* 6.0* 6.1* 5.8*  ALBUMIN 3.9 3.8 3.8 3.8 3.7   Recent Labs  Lab 12/10/24 2037  LIPASE 50   No results for input(s): AMMONIA in the last 168 hours. Coagulation Profile: Recent Labs  Lab 12/12/24 1947 12/13/24 0359  INR 0.9 1.0   Cardiac Enzymes: No results for input(s): CKTOTAL, CKMB, CKMBINDEX, TROPONINI in the last 168 hours. BNP (last 3 results) No results for input(s): PROBNP in the last 8760 hours. HbA1C: No results for input(s): HGBA1C in the last 72 hours. CBG: No results for input(s): GLUCAP in the last 168 hours. Lipid Profile: No results for input(s): CHOL, HDL, LDLCALC, TRIG, CHOLHDL, LDLDIRECT in the last 72 hours. Thyroid  Function Tests: No results for input(s): TSH, T4TOTAL, FREET4, T3FREE, THYROIDAB in the last 72 hours. Anemia Panel: No results for input(s): VITAMINB12, FOLATE, FERRITIN, TIBC, IRON, RETICCTPCT in the last 72 hours. Sepsis Labs: Recent Labs  Lab 12/12/24 1947  LATICACIDVEN  1.7    Recent Results (from the past 240 hours)  Culture, blood (Routine x 2)     Status: None   Collection Time: 12/12/24  7:47 PM   Specimen: BLOOD  Result Value Ref Range Status   Specimen Description BLOOD BLOOD LEFT ARM  Final   Special Requests   Final    BOTTLES DRAWN AEROBIC AND ANAEROBIC Blood Culture adequate volume   Culture   Final    NO GROWTH 5 DAYS Performed at [facility], [address] [place], [place] [address]    Report  Status 12/17/2024 FINAL  Final  Culture, blood (Routine x 2)     Status: None   Collection Time: 12/12/24  7:56 PM   Specimen: BLOOD RIGHT HAND  Result Value Ref Range Status   Specimen Description BLOOD RIGHT HAND  Final   Special Requests   Final    BOTTLES DRAWN AEROBIC AND ANAEROBIC Blood Culture adequate volume   Culture   Final    NO GROWTH 5 DAYS Performed at [facility], [address] [place], [place] [address]    Report Status 12/17/2024 FINAL  Final         Radiology Studies: No results found.         LOS: 5 days   Time spent= 35 mins    Burgess JAYSON Dare, MD [facility]  If 7PM-7AM, please contact night-coverage  12/17/2024, 11:42 AM  "

## 2024-12-17 NOTE — Progress Notes (Signed)
 " Subjective: Still with some bloating but abdominal pain seems improved with change in diet. Endorses intermittent stabbing pain in her stomach that can occur in different areas. No BM still since 1/10. She has some nausea but no vomiting. She is passing flatus and belching.   Objective: Vital signs in last 24 hours: Temp:  [98 F (36.7 C)-98.5 F (36.9 C)] 98 F (36.7 C) (01/13 0150) Pulse Rate:  [64-106] 64 (01/13 0150) Resp:  [12-22] 20 (01/13 0150) BP: (91-129)/(62-94) 108/66 (01/13 0150) SpO2:  [91 %-97 %] 97 % (01/13 0150) Weight:  [74.8 kg] 74.8 kg (01/12 1049) Last BM Date : 12/13/24 General:   Alert and oriented, pleasant Head:  Normocephalic and atraumatic. Eyes:  No icterus, sclera clear. Conjuctiva pink.  Mouth:  Without lesions, mucosa pink and moist.  Neck:  Supple, without thyromegaly or masses.  Heart:  S1, S2 present, no murmurs noted.  Lungs: Clear to auscultation bilaterally, without wheezing, rales, or rhonchi.  Abdomen:  Bowel sounds present, soft, non-tender, non-distended. No HSM or hernias noted. No rebound or guarding. No masses appreciated  Extremities:  Without clubbing or edema. Neurologic:  Alert and  oriented x4;  grossly normal neurologically. Skin:  Warm and dry, intact without significant lesions.  Psych:  Alert and cooperative. Normal mood and affect.  Lab Results: Recent Labs    12/15/24 0537 12/16/24 0440  WBC 7.6 7.4  HGB 12.0 11.8*  HCT 35.0* 35.6*  PLT 237 257   BMET Recent Labs    12/15/24 0537 12/16/24 0440 12/17/24 0518  NA 139 139 140  K 3.9 3.9 4.3  CL 104 103 106  CO2 28 30 29   GLUCOSE 97 92 84  BUN 7 7 6   CREATININE 0.81 0.82 0.73  CALCIUM 8.6* 8.8* 8.6*   LFT Recent Labs    12/15/24 0537 12/16/24 0440 12/17/24 0518  PROT 6.0* 6.1* 5.8*  ALBUMIN 3.8 3.8 3.7  AST 64* 62* 134*  ALT 155* 138* 171*  ALKPHOS 194* 178* 168*  BILITOT 0.2 0.3 0.3    Assessment: Sandra Glover is a 39 year old female with  history of anxiety/depression, IBS with constipation, HTN, GERD, and recent chronic abdominal pain with history of cholecystectomy who presented to the ED with complaints of hematuria, lower abdominal pain, nausea, and weakness.  GI consulted for transaminitis and further evaluation of her abdominal pain which had been being worked up outpatient but recently acutely worsened.   Elevated LFTs:  -etiology unclear.   -Status post cholecystectomy.   -ANA positive though immunoglobulin levels normal.  -Antimicrosomal Ab Liver/kidney negative -A1A normal, ceruoloplasmin normal -ASMA negative  -previous IgG and IgM WNL, AMA negative as outpatient  -GGT 94 -Negative for viral hepatitis.   -Ceruloplasmin, HSV, CMV, EBV unremarkable.   -Imaging including CT, MRI/MRCP, ultrasound reassuring. -Underwent liver biopsy 12/13/2024, results pending.   Abdominal pain -most upper abdominal pain with some sharp pains diffusely -pain worse with eating, worse with certain foods  -endorses sensation of phantom GB attacks (s/p chole in 2011) -takes mobic PRN at home, reports using only occasionally  -underwent EGD yesterday  - Normal esophagus.             - Small hiatal hernia. Antral erosions with 2 areas of healing ulceration?status post biopsy             - Denuded areas of duodenal bulb and second  portion?status post biopsy  Given her concomitant abdominal pain and episodic nature of her  symptoms, sphincter of Oddi dysfunction also on differential.  May benefit from outpatient sphincter of Oddi manometry.  Chronic constipation: -Previously well-controlled on Trulance , now not covered.   -no BM since 1/10 -Continue Linzess  290 mcg daily.   -Continue MiraLAX , increase to TID  -Likely worsened in the setting of ongoing opioid analgesia requirement.   -Consider addition of Movantik.   Rectal bleeding: -noted on stool and on toilet tissue, some clots at times  -mild, likely hemorrhoidal in the  setting of constipation -intermittent over the past 3-4 months  -last TCS 2017, history of hemorrhoidectomy in 2017 -Consider outpatient colonoscopy to further evaluate    Plan: Await liver biopsy results Await EGD path results Trend LFTs PPI BID  Will start carafate  1g QID   Continue linzess  290mcg daily Increase miralax  to 17g TID  Consider movantik if constipation persists  outpatient colonoscopy for rectal bleeding  Continue full liquid diet for now    LOS: 5 days    12/17/2024, 9:04 AM   Sandra Luhrs L. Mariette, MSN, APRN, AGNP-C Adult-Gerontology Nurse Practitioner [facility]

## 2024-12-18 DIAGNOSIS — K219 Gastro-esophageal reflux disease without esophagitis: Secondary | ICD-10-CM | POA: Diagnosis not present

## 2024-12-18 DIAGNOSIS — R1011 Right upper quadrant pain: Secondary | ICD-10-CM | POA: Diagnosis not present

## 2024-12-18 DIAGNOSIS — F418 Other specified anxiety disorders: Secondary | ICD-10-CM | POA: Diagnosis not present

## 2024-12-18 DIAGNOSIS — R7989 Other specified abnormal findings of blood chemistry: Secondary | ICD-10-CM

## 2024-12-18 DIAGNOSIS — R1013 Epigastric pain: Secondary | ICD-10-CM | POA: Diagnosis not present

## 2024-12-18 DIAGNOSIS — K625 Hemorrhage of anus and rectum: Secondary | ICD-10-CM | POA: Diagnosis not present

## 2024-12-18 DIAGNOSIS — K5909 Other constipation: Secondary | ICD-10-CM

## 2024-12-18 DIAGNOSIS — F909 Attention-deficit hyperactivity disorder, unspecified type: Secondary | ICD-10-CM | POA: Diagnosis not present

## 2024-12-18 DIAGNOSIS — R7401 Elevation of levels of liver transaminase levels: Secondary | ICD-10-CM | POA: Diagnosis not present

## 2024-12-18 DIAGNOSIS — K5904 Chronic idiopathic constipation: Secondary | ICD-10-CM | POA: Diagnosis not present

## 2024-12-18 LAB — COMPREHENSIVE METABOLIC PANEL WITH GFR
ALT: 156 U/L — ABNORMAL HIGH (ref 0–44)
AST: 73 U/L — ABNORMAL HIGH (ref 15–41)
Albumin: 4 g/dL (ref 3.5–5.0)
Alkaline Phosphatase: 180 U/L — ABNORMAL HIGH (ref 38–126)
Anion gap: 13 (ref 5–15)
BUN: <5 mg/dL — ABNORMAL LOW (ref 6–20)
CO2: 24 mmol/L (ref 22–32)
Calcium: 9.1 mg/dL (ref 8.9–10.3)
Chloride: 102 mmol/L (ref 98–111)
Creatinine, Ser: 0.83 mg/dL (ref 0.44–1.00)
GFR, Estimated: >60 mL/min
Glucose, Bld: 109 mg/dL — ABNORMAL HIGH (ref 70–99)
Potassium: 3.9 mmol/L (ref 3.5–5.1)
Sodium: 139 mmol/L (ref 135–145)
Total Bilirubin: 0.3 mg/dL (ref 0.0–1.2)
Total Protein: 6.4 g/dL — ABNORMAL LOW (ref 6.5–8.1)

## 2024-12-18 NOTE — Progress Notes (Signed)
 " PROGRESS NOTE    Sandra Glover  FMW:[id number] DOB: 23-Jul-1986 DOA: 12/12/2024  PCP: [name], FNP   Brief narrative: 39 year old female with history of HTN, anxiety, IBS, GERD, chronic abdominal pain presents to the ED with hematuria and right lower abdominal pain.  Workup showed transaminitis, CT abdomen pelvis was unremarkable.  She was started on IV Rocephin  due to concerns of urinary tract infection.  GI team was consulted.  MRCP was unremarkable.  EGD on 1/12 showed normal esophagus, small hiatal hernia and antral healing erosion/ulcer.   Assessment and plan: 1-acute on chronic abdominal pain/transaminitis - So far tolerating full liquid diet - GI service on board and will follow recommendation - Continue twice a day PPI - Follow-up results from liver biopsy (done on 12/13/2024 and also pending results from biopsy taken during endoscopic evaluation on 12/16/2024. -Continue as needed analgesics and the use of Linzess .  2-hematuria - Resolved - There was initial concern for possible UTI, patient empirically was covered with Rocephin ; urinalysis/culture demonstrating no growth and antibiotic has been discontinued. - Patient advised to maintain adequate hydration.  3-left neck pain - CT scan demonstrating no acute abnormality - Continue analgesic and muscle relaxant.  4-essential hypertension - Continue current antihypertensive agents - Follow-up vital signs.  5-GERD - As mentioned above continue PPI and also Carafate  as recommended by GI service.  6-constipation - Continue Colace, MiraLAX  and Linzess .  7-history of ADHD - Continue Adderall.  8-anxiety/bipolar disorder - Continue Prozac  and as needed Xanax . - Overall stable mood appreciated.   Diet Orders (From admission, onward)     Start     Ordered   12/16/24 1647  Diet full liquid Room service appropriate? Yes; Fluid consistency: Thin  Diet effective now       Question Answer Comment  Room service  appropriate? Yes   Fluid consistency: Thin      12/16/24 1647           Subjective: Afebrile, no chest pain, no nausea, no vomiting.  So far tolerating liquid diet.  Still complaining of intermittent nausea and ongoing abdominal pain.  Patient reports passing flatus and having 2 small BMs.  Objective: Vitals:   12/17/24 1202 12/17/24 1900 12/18/24 0425 12/18/24 1230  BP: 124/79 (!) 109/58 107/68 116/83  Pulse: 72 77 78 82  Resp: 17 18 16    Temp: 97.8 F (36.6 C) 98.3 F (36.8 C) 97.9 F (36.6 C) 99.2 F (37.3 C)  TempSrc: Oral Oral Oral Oral  SpO2: 97% 98% 94% 97%  Weight:      Height:       General exam: Alert, awake, oriented x 3; reporting intermittent nausea and ongoing abdominal pain.  No fever. Respiratory system: Clear to auscultation. Respiratory effort normal.  Good saturation on room air. Cardiovascular system:RRR. No murmurs, rubs, gallops. Gastrointestinal system: Abdomen is nondistended, soft and no guarding appreciated on exam.  Positive bowel sounds. Central nervous system: Alert and oriented. No focal neurological deficits. Extremities: No cyanosis or clubbing. Skin: No petechiae. Psychiatry: Judgement and insight appear normal. Mood & affect appropriate.    Intake/Output Summary (Last 24 hours) at 12/18/2024 1828 Last data filed at 12/18/2024 0950 Gross per 24 hour  Intake 240 ml  Output --  Net 240 ml   Filed Weights   12/12/24 1923 12/16/24 1049  Weight: 74.8 kg 74.8 kg    Scheduled Meds:  amphetamine -dextroamphetamine   20 mg Oral BID   docusate sodium   100 mg Oral Daily  FLUoxetine   40 mg Oral Daily   linaclotide   290 mcg Oral QAC breakfast   pantoprazole   40 mg Oral BID AC   polyethylene glycol  17 g Oral TID   simethicone   80 mg Oral TID AC   sucralfate   1 g Oral TID WC & HS   Nutritional status     Body mass index is 29.23 kg/m.  Data Reviewed:   CBC: Recent Labs  Lab 12/12/24 1947 12/13/24 0359 12/14/24 0535  12/15/24 0537 12/16/24 0440  WBC 10.9* 8.2 7.5 7.6 7.4  NEUTROABS 7.4  --  4.4  --   --   HGB 12.5 11.1* 12.7 12.0 11.8*  HCT 37.3 32.9* 37.7 35.0* 35.6*  MCV 96.6 97.3 99.5 98.9 98.9  PLT 272 231 248 237 257   Basic Metabolic Panel: Recent Labs  Lab 12/13/24 0359 12/14/24 0535 12/15/24 0537 12/16/24 0440 12/17/24 0518 12/18/24 0511  NA 138 140 139 139 140 139  K 4.1 4.1 3.9 3.9 4.3 3.9  CL 103 104 104 103 106 102  CO2 27 28 28 30 29 24   GLUCOSE 93 92 97 92 84 109*  BUN 7 8 7 7 6  <5*  CREATININE 0.86 0.75 0.81 0.82 0.73 0.83  CALCIUM 8.1* 8.6* 8.6* 8.8* 8.6* 9.1  MG 2.1  --   --   --   --   --   PHOS 3.4  --   --   --   --   --    GFR: Estimated Creatinine Clearance: 90.8 mL/min (by C-G formula based on SCr of 0.83 mg/dL).  Liver Function Tests: Recent Labs  Lab 12/14/24 0535 12/15/24 0537 12/16/24 0440 12/17/24 0518 12/18/24 0511  AST 94* 64* 62* 134* 73*  ALT 204* 155* 138* 171* 156*  ALKPHOS 182* 194* 178* 168* 180*  BILITOT 0.3 0.2 0.3 0.3 0.3  PROT 6.0* 6.0* 6.1* 5.8* 6.4*  ALBUMIN 3.8 3.8 3.8 3.7 4.0   Coagulation Profile: Recent Labs  Lab 12/12/24 1947 12/13/24 0359  INR 0.9 1.0    Recent Labs  Lab 12/12/24 1947  LATICACIDVEN 1.7    Recent Results (from the past 240 hours)  Culture, blood (Routine x 2)     Status: None   Collection Time: 12/12/24  7:47 PM   Specimen: BLOOD  Result Value Ref Range Status   Specimen Description BLOOD BLOOD LEFT ARM  Final   Special Requests   Final    BOTTLES DRAWN AEROBIC AND ANAEROBIC Blood Culture adequate volume   Culture   Final    NO GROWTH 5 DAYS Performed at [facility], [address], [place], [place] [address]    Report Status 12/17/2024 FINAL  Final  Culture, blood (Routine x 2)     Status: None   Collection Time: 12/12/24  7:56 PM   Specimen: BLOOD RIGHT HAND  Result Value Ref Range Status   Specimen Description BLOOD RIGHT HAND  Final   Special Requests   Final    BOTTLES DRAWN  AEROBIC AND ANAEROBIC Blood Culture adequate volume   Culture   Final    NO GROWTH 5 DAYS Performed at [facility], [address], [place], [place] [address]    Report Status 12/17/2024 FINAL  Final     Radiology Studies: CT SOFT TISSUE NECK W CONTRAST Result Date: 12/17/2024 EXAM: CT NECK WITH CONTRAST 12/17/2024 04:30:10 PM TECHNIQUE: CT of the neck was performed with the administration of intravenous contrast. 75 mL of Omnipaque  was administered.  Multiplanar reformatted images are provided for review. Automated exposure control, iterative reconstruction, and/or weight based adjustment of the mA/kV was utilized to reduce the radiation dose to as low as reasonably achievable. COMPARISON: CT cervical spine 12/14/2022. CLINICAL HISTORY: 39 year old female.  New onset left side neck pain. Soft tissue infection suspected, neck. Abnormal liver enzymes, recent ultrasound guided liver biopsy. FINDINGS: AERODIGESTIVE TRACT: At the larynx, the glottis is closed. No discrete mass. No edema. SALIVARY GLANDS: The parotid and submandibular glands are unremarkable. THYROID : Unremarkable. LYMPH NODES: Bilateral cervical lymph nodes appear stable since 2024 and normal. SOFT TISSUES: No mass or fluid collection. 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 VASCULATURE: Major vascular structures in the bilateral neck and at the skull base are enhancing and patent. OTHER: Visualized sinuses and mastoid air cells are well aerated. Visualized lungs are clear. IMPRESSION: 1. No acute or inflammatory process identified in the neck. Electronically signed by: Helayne Hurst MD 12/17/2024 05:46 PM EST RP Workstation: [id number]     LOS: 6 days   Time spent= 35 mins    Eric Nunnery, MD [facility]  If 7PM-7AM, please contact night-coverage  12/18/2024, 6:28 PM  "

## 2024-12-18 NOTE — Plan of Care (Signed)

## 2024-12-18 NOTE — Progress Notes (Addendum)
 " Subjective: States she has been feeling a lot better until she had a good BM this morning which seemed to cause her some abdominal discomfort. She notes 2 smaller stools with a lot of gas yesterday. She feels full liquid diet is much better tolerated than solids. Feels carafate  has helped as well, reports she did not need dilaudid  last night.   Objective: Vital signs in last 24 hours: Temp:  [97.8 F (36.6 C)-98.3 F (36.8 C)] 97.9 F (36.6 C) (01/14 0425) Pulse Rate:  [72-78] 78 (01/14 0425) Resp:  [16-18] 16 (01/14 0425) BP: (107-124)/(58-79) 107/68 (01/14 0425) SpO2:  [94 %-98 %] 94 % (01/14 0425) Last BM Date : 12/18/24 General:   Alert and oriented, pleasant Head:  Normocephalic and atraumatic. Eyes:  No icterus, sclera clear. Conjuctiva pink.  Mouth:  Without lesions, mucosa pink and moist.  Heart:  S1, S2 present, no murmurs noted.  Lungs: Clear to auscultation bilaterally, without wheezing, rales, or rhonchi.  Abdomen:  Bowel sounds present, soft, generalized TTP non-distended. No HSM or hernias noted. No rebound or guarding. No masses appreciated  Neurologic:  Alert and  oriented x4;  grossly normal neurologically. Skin:  Warm and dry, intact without significant lesions.  Psych:  Alert and cooperative. Normal mood and affect.  Intake/Output from previous day: No intake/output data recorded. Intake/Output this shift: No intake/output data recorded.  Lab Results: Recent Labs    12/16/24 0440  WBC 7.4  HGB 11.8*  HCT 35.6*  PLT 257   BMET Recent Labs    12/16/24 0440 12/17/24 0518 12/18/24 0511  NA 139 140 139  K 3.9 4.3 3.9  CL 103 106 102  CO2 30 29 24   GLUCOSE 92 84 109*  BUN 7 6 <5*  CREATININE 0.82 0.73 0.83  CALCIUM 8.8* 8.6* 9.1   LFT Recent Labs    12/16/24 0440 12/17/24 0518 12/18/24 0511  PROT 6.1* 5.8* 6.4*  ALBUMIN 3.8 3.7 4.0  AST 62* 134* 73*  ALT 138* 171* 156*  ALKPHOS 178* 168* 180*  BILITOT 0.3 0.3 0.3     Studies/Results: CT SOFT TISSUE NECK W CONTRAST Result Date: 12/17/2024 EXAM: CT NECK WITH CONTRAST 12/17/2024 04:30:10 PM TECHNIQUE: CT of the neck was performed with the administration of intravenous contrast. 75 mL of Omnipaque  was administered. Multiplanar reformatted images are provided for review. Automated exposure control, iterative reconstruction, and/or weight based adjustment of the mA/kV was utilized to reduce the radiation dose to as low as reasonably achievable. COMPARISON: CT cervical spine 12/14/2022. CLINICAL HISTORY: 39 year old female.  New onset left side neck pain. Soft tissue infection suspected, neck. Abnormal liver enzymes, recent ultrasound guided liver biopsy. FINDINGS: AERODIGESTIVE TRACT: At the larynx, the glottis is closed. No discrete mass. No edema. SALIVARY GLANDS: The parotid and submandibular glands are unremarkable. THYROID : Unremarkable. LYMPH NODES: Bilateral cervical lymph nodes appear stable since 2024 and normal. SOFT TISSUES: No mass or fluid collection. 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 VASCULATURE: Major vascular structures in the bilateral neck and at the skull base are enhancing and patent. OTHER: Visualized sinuses and mastoid air cells are well aerated. Visualized lungs are clear. IMPRESSION: 1. No acute or inflammatory process identified in the neck. Electronically signed by: Helayne Hurst MD 12/17/2024 05:46 PM EST RP Workstation: [id number]    Assessment: Sandra Glover is a 39 year old female with history of anxiety/depression, IBS with constipation, HTN, GERD,  and recent chronic abdominal pain with history of cholecystectomy who presented to the ED with complaints of hematuria, lower abdominal pain, nausea, and weakness.  GI consulted for transaminitis and further evaluation of her abdominal pain which had been being worked up outpatient but  recently acutely worsened.    Elevated LFTs:  -etiology unclear.   -Status post cholecystectomy.   -ANA positive though immunoglobulin levels normal.  -Antimicrosomal Ab Liver/kidney negative -A1A normal, ceruoloplasmin normal -ASMA negative  -previous IgG and IgM WNL, AMA negative as outpatient  -GGT 94 -Negative for viral hepatitis.   -Ceruloplasmin, HSV, CMV, EBV unremarkable.   -Imaging including CT, MRI/MRCP, ultrasound reassuring. -Underwent liver biopsy 12/13/2024, results pending. -improving some today: AST 73 (134), ALT 156 (171) Alk phos 180 (194)   Abdominal pain -most upper abdominal pain with some sharp pains diffusely -pain worse with eating, worse with certain foods  -endorses sensation of phantom GB attacks (s/p chole in 2011) -takes mobic PRN at home, reports using only occasionally -some improvement with Carafate  -underwent EGD Monday - Normal esophagus.             - Small hiatal hernia. Antral erosions with 2 areas of healing ulceration?status post biopsy             - Denuded areas of duodenal bulb and second  portion?status post biopsy   EGD Biopsy results: A. STOMACH, BIOPSY:  Reactive gastropathy with focal activity and mild chronic gastritis  Negative for H. pylori, intestinal metaplasia, dysplasia and carcinoma   B. DUODENAL BIOPSY:  Mild acute duodenitis with gastric metaplasia and focal erosion  consistent with peptic duodenitis   I did discuss EGD biopsy results with the patient today  She continues to denie any NSAIDs and states no mobic for the past month, uses very infrequently    Given her concomitant abdominal pain and episodic nature of her symptoms, sphincter of Oddi dysfunction also on differential.  May benefit from further evaluation of this in the future with EUS.   Chronic constipation: -Previously well-controlled on Trulance , now not covered.   -no BM since 1/10, finally had a good BM this morning, passing flatus  -Continue Linzess   290 mcg daily.   -Continue MiraLAX  17g TID  -Likely worsened in the setting of ongoing opioid analgesia requirement.   -Consider addition of Movantik if constipation persists    Rectal bleeding: -noted on stool and on toilet tissue, some clots at times  -mild, likely hemorrhoidal in the setting of constipation -intermittent over the past 3-4 months  -last TCS 2017, history of hemorrhoidectomy in 2017 -Consider outpatient colonoscopy to further evaluate     Plan: Await liver biopsy results Trend LFTs PPI BID  Continue carafate  1g QID   Continue linzess  daily Increase miralax  to 17g TID  Consider movantik if constipation persists  outpatient colonoscopy for rectal bleeding  Continue full liquid diet for now     LOS: 6 days    12/18/2024, 9:48 AM   Mystie Ormand L. Mariette, MSN, APRN, AGNP-C Adult-Gerontology Nurse Practitioner [facility]

## 2024-12-19 DIAGNOSIS — R7989 Other specified abnormal findings of blood chemistry: Secondary | ICD-10-CM | POA: Diagnosis not present

## 2024-12-19 DIAGNOSIS — R7401 Elevation of levels of liver transaminase levels: Secondary | ICD-10-CM | POA: Diagnosis not present

## 2024-12-19 DIAGNOSIS — K5909 Other constipation: Secondary | ICD-10-CM | POA: Diagnosis not present

## 2024-12-19 DIAGNOSIS — K5904 Chronic idiopathic constipation: Secondary | ICD-10-CM | POA: Diagnosis not present

## 2024-12-19 DIAGNOSIS — K625 Hemorrhage of anus and rectum: Secondary | ICD-10-CM | POA: Diagnosis not present

## 2024-12-19 DIAGNOSIS — R1011 Right upper quadrant pain: Secondary | ICD-10-CM | POA: Diagnosis not present

## 2024-12-19 DIAGNOSIS — R1010 Upper abdominal pain, unspecified: Secondary | ICD-10-CM | POA: Diagnosis not present

## 2024-12-19 LAB — COMPREHENSIVE METABOLIC PANEL WITH GFR
ALT: 122 U/L — ABNORMAL HIGH (ref 0–44)
AST: 53 U/L — ABNORMAL HIGH (ref 15–41)
Albumin: 3.8 g/dL (ref 3.5–5.0)
Alkaline Phosphatase: 163 U/L — ABNORMAL HIGH (ref 38–126)
Anion gap: 12 (ref 5–15)
BUN: 6 mg/dL (ref 6–20)
CO2: 24 mmol/L (ref 22–32)
Calcium: 8.8 mg/dL — ABNORMAL LOW (ref 8.9–10.3)
Chloride: 102 mmol/L (ref 98–111)
Creatinine, Ser: 0.86 mg/dL (ref 0.44–1.00)
GFR, Estimated: >60 mL/min
Glucose, Bld: 95 mg/dL (ref 70–99)
Potassium: 3.8 mmol/L (ref 3.5–5.1)
Sodium: 138 mmol/L (ref 135–145)
Total Bilirubin: 0.3 mg/dL (ref 0.0–1.2)
Total Protein: 5.9 g/dL — ABNORMAL LOW (ref 6.5–8.1)

## 2024-12-19 LAB — SURGICAL PATHOLOGY

## 2024-12-19 MED ORDER — SODIUM CHLORIDE 0.9 % IV SOLN: INTRAVENOUS | Status: AC

## 2024-12-19 MED ORDER — ENSURE PLUS HIGH PROTEIN PO LIQD
237.0000 mL | Freq: Three times a day (TID) | ORAL | Status: DC
  Administered 2024-12-19 – 2024-12-21 (×6): 237 mL via ORAL

## 2024-12-19 NOTE — Progress Notes (Signed)
 Initial Nutrition Assessment  DOCUMENTATION CODES:  Not applicable  INTERVENTION:  Ensure Plus High Protein po TID, each supplement provides 350 kcal and 20 grams of protein. Magic cup TID with meals, each supplement provides 290 kcal and 9 grams of protein  NUTRITION DIAGNOSIS:  Inadequate oral intake related to decreased appetite, nausea as evidenced by per patient/family report.  GOAL:  Patient will meet greater than or equal to 90% of their needs  MONITOR:  PO intake, Supplement acceptance, Diet advancement  REASON FOR ASSESSMENT:  Malnutrition Screening Tool   ASSESSMENT:  39 yo female admitted with UTI, transaminitis, abdominal pain. PMH includes anxiety, depression, HTN, hemorrhoids, osteopenia, arthritis, cholecystectomy (2011), partial nephrectomy at age 50 months.  12/31: MRCP, no acute findings 1/9: Liver biopsy, results pending 1/12: UGI endoscopy, small hiatal hernia seen   Patient reports ongoing abdominal pain. She has been able to tolerate liquids okay. PTA, intake has been poor for several months with a lot of weight loss reported. She occasionally drinks a protein shake at home, but has never tried Ensure/Boost supplements.   Usual weights:  89.4 kg (05/06/24) 81.6 kg (06/24/24) 77.1 kg (08/20/24) 76.2 kg (11/22/24) Current weight: 74.8 kg (12/16/24)  Weight loss from June to July 2025 was severe (9%), weight loss has continued over the past 6 months, but rate of weight loss has decreased.  Diet History: 1/9 NPO-->regular diet 1/12 NPO-->clear liquids-->full liquids  Average Meal Intake: 1/14-1/15: 83% intake x 3 recorded meals  Nutritionally Relevant Medications: Scheduled Meds:  amphetamine -dextroamphetamine   20 mg Oral BID   docusate sodium   100 mg Oral Daily   FLUoxetine   40 mg Oral Daily   linaclotide   290 mcg Oral QAC breakfast   pantoprazole   40 mg Oral BID AC   polyethylene glycol  17 g Oral TID   simethicone   80 mg Oral TID AC   sucralfate    1 g Oral TID WC & HS   Continuous Infusions: PRN Meds:.acetaminophen , ALPRAZolam , benzonatate , glucagon  (human recombinant), hydrALAZINE , HYDROmorphone  (DILAUDID ) injection, ipratropium-albuterol , methocarbamol , metoprolol  tartrate, ondansetron  **OR** ondansetron  (ZOFRAN ) IV, oxyCODONE , traZODone , witch hazel-glycerin   Labs Reviewed.  NUTRITION - FOCUSED PHYSICAL EXAM: Flowsheet Row Most Recent Value  Orbital Region No depletion  Upper Arm Region No depletion  Thoracic and Lumbar Region No depletion  Buccal Region No depletion  Temple Region No depletion  Clavicle Bone Region No depletion  Clavicle and Acromion Bone Region No depletion  Scapular Bone Region No depletion  Dorsal Hand Mild depletion  Patellar Region No depletion  Anterior Thigh Region No depletion  Posterior Calf Region Mild depletion  Edema (RD Assessment) None  Hair Reviewed  Eyes Reviewed  Mouth Reviewed  Skin Reviewed  Nails Reviewed   Diet Order:   Diet Order             Diet full liquid Room service appropriate? Yes; Fluid consistency: Thin  Diet effective now                  EDUCATION NEEDS:  Not appropriate for education at this time  Skin:  Skin Assessment: Reviewed RN Assessment  Last BM:  1/15 diarrhea per patient  Height:  Ht Readings from Last 1 Encounters:  12/16/24 5' 3 (1.6 m)   Weight:  Wt Readings from Last 1 Encounters:  12/16/24 74.8 kg   Ideal Body Weight:  52.3 kg  BMI:  Body mass index is 29.23 kg/m.  Estimated Nutritional Needs:  Kcal:  1700-1900 Protein:  85-100 gm Fluid:  1.7-1.9 L   Suzen HUNT RD, LDN, CNSC Contact via secure chat. If unavailable, use group chat RD Inpatient.

## 2024-12-19 NOTE — Progress Notes (Signed)
 " PROGRESS NOTE    Sandra Glover  FMW:[id number] DOB: 1986-10-16 DOA: 12/12/2024  PCP: [name], FNP   Brief narrative: 39 year old female with history of HTN, anxiety, IBS, GERD, chronic abdominal pain presents to the ED with hematuria and right lower abdominal pain.  Workup showed transaminitis, CT abdomen pelvis was unremarkable.  She was started on IV Rocephin  due to concerns of urinary tract infection.  GI team was consulted.  MRCP was unremarkable.  EGD on 1/12 showed normal esophagus, small hiatal hernia and antral healing erosion/ulcer.   Assessment and plan: 1-acute on chronic abdominal pain/transaminitis - So far tolerating full liquid diet - GI service on board and will follow recommendation - Continue twice a day PPI - Follow-up results from liver biopsy (done on 12/13/2024 and also pending results from biopsy taken during endoscopic evaluation on 12/16/2024. -Continue as needed analgesics and the use of Linzess . - Continue to maintain adequate hydration. - LFTs continue trending down.  2-hematuria - Resolved - There was initial concern for possible UTI, patient empirically was covered with Rocephin ; urinalysis/culture demonstrating no growth and antibiotic has been discontinued. - Patient advised to maintain adequate hydration.  3-left neck pain - CT scan demonstrating no acute abnormality - Continue analgesic and muscle relaxant.  4-essential hypertension - Continue current antihypertensive agents - Follow-up vital signs.  5-GERD - As mentioned above continue PPI and also Carafate  as recommended by GI service.  6-constipation - Continue Colace, MiraLAX  and Linzess .  7-history of ADHD - Continue Adderall.  8-anxiety/bipolar disorder - Continue Prozac  and as needed Xanax . - Overall stable mood appreciated.   Diet Orders (From admission, onward)     Start     Ordered   12/16/24 1647  Diet full liquid Room service appropriate? Yes; Fluid  consistency: Thin  Diet effective now       Question Answer Comment  Room service appropriate? Yes   Fluid consistency: Thin      12/16/24 1647           Subjective: No fever, no chest pain, no vomiting.  Reported intermittent nausea and still ongoing abdominal pain.  Expressed to be able to tolerate full liquid diet.  Feeling dehydrated.  Objective: Vitals:   12/18/24 1230 12/18/24 2100 12/19/24 0500 12/19/24 1331  BP: 116/83 112/68 104/64 128/79  Pulse: 82 78 73 79  Resp:  19 18 18   Temp: 99.2 F (37.3 C) 98 F (36.7 C) 97.9 F (36.6 C) 98.6 F (37 C)  TempSrc: Oral Oral Oral Oral  SpO2: 97% 93% 92% 92%  Weight:      Height:       General exam: Alert, awake, oriented x 3; continue reporting intermittent abdominal pain and some nausea.  No vomiting. Respiratory system: Clear to auscultation. Respiratory effort normal.  Good saturation on room air. Cardiovascular system:RRR. No murmurs, rubs, gallops. Gastrointestinal system: Abdomen is nondistended, soft and no guarding; positive bowel sounds. Central nervous system: Alert and oriented. No focal neurological deficits. Extremities: No cyanosis or clubbing. Skin: No petechiae. Psychiatry: Judgement and insight appear normal. Mood & affect appropriate.    Intake/Output Summary (Last 24 hours) at 12/19/2024 1908 Last data filed at 12/19/2024 1331 Gross per 24 hour  Intake 480 ml  Output --  Net 480 ml   Filed Weights   12/12/24 1923 12/16/24 1049  Weight: 74.8 kg 74.8 kg    Scheduled Meds:  amphetamine -dextroamphetamine   20 mg Oral BID   docusate sodium   100 mg Oral  Daily   feeding supplement  237 mL Oral TID BM   FLUoxetine   40 mg Oral Daily   linaclotide   290 mcg Oral QAC breakfast   pantoprazole   40 mg Oral BID AC   polyethylene glycol  17 g Oral TID   simethicone   80 mg Oral TID AC   sucralfate   1 g Oral TID WC & HS   Nutritional status Signs/Symptoms: per patient/family report Interventions: Ensure  Enlive (each supplement provides 350kcal and 20 grams of protein), Magic cup Body mass index is 29.23 kg/m.  Data Reviewed:   CBC: Recent Labs  Lab 12/12/24 1947 12/13/24 0359 12/14/24 0535 12/15/24 0537 12/16/24 0440  WBC 10.9* 8.2 7.5 7.6 7.4  NEUTROABS 7.4  --  4.4  --   --   HGB 12.5 11.1* 12.7 12.0 11.8*  HCT 37.3 32.9* 37.7 35.0* 35.6*  MCV 96.6 97.3 99.5 98.9 98.9  PLT 272 231 248 237 257   Basic Metabolic Panel: Recent Labs  Lab 12/13/24 0359 12/14/24 0535 12/15/24 0537 12/16/24 0440 12/17/24 0518 12/18/24 0511 12/19/24 0402  NA 138   < > 139 139 140 139 138  K 4.1   < > 3.9 3.9 4.3 3.9 3.8  CL 103   < > 104 103 106 102 102  CO2 27   < > 28 30 29 24 24   GLUCOSE 93   < > 97 92 84 109* 95  BUN 7   < > 7 7 6  <5* 6  CREATININE 0.86   < > 0.81 0.82 0.73 0.83 0.86  CALCIUM 8.1*   < > 8.6* 8.8* 8.6* 9.1 8.8*  MG 2.1  --   --   --   --   --   --   PHOS 3.4  --   --   --   --   --   --    < > = values in this interval not displayed.   GFR: Estimated Creatinine Clearance: 87.7 mL/min (by C-G formula based on SCr of 0.86 mg/dL).  Liver Function Tests: Recent Labs  Lab 12/15/24 0537 12/16/24 0440 12/17/24 0518 12/18/24 0511 12/19/24 0402  AST 64* 62* 134* 73* 53*  ALT 155* 138* 171* 156* 122*  ALKPHOS 194* 178* 168* 180* 163*  BILITOT 0.2 0.3 0.3 0.3 0.3  PROT 6.0* 6.1* 5.8* 6.4* 5.9*  ALBUMIN 3.8 3.8 3.7 4.0 3.8   Coagulation Profile: Recent Labs  Lab 12/12/24 1947 12/13/24 0359  INR 0.9 1.0    Recent Labs  Lab 12/12/24 1947  LATICACIDVEN 1.7    Recent Results (from the past 240 hours)  Culture, blood (Routine x 2)     Status: None   Collection Time: 12/12/24  7:47 PM   Specimen: BLOOD  Result Value Ref Range Status   Specimen Description BLOOD BLOOD LEFT ARM  Final   Special Requests   Final    BOTTLES DRAWN AEROBIC AND ANAEROBIC Blood Culture adequate volume   Culture   Final    NO GROWTH 5 DAYS Performed at [facility], [address], [place], [place] [address]    Report Status 12/17/2024 FINAL  Final  Culture, blood (Routine x 2)     Status: None   Collection Time: 12/12/24  7:56 PM   Specimen: BLOOD RIGHT HAND  Result Value Ref Range Status   Specimen Description BLOOD RIGHT HAND  Final   Special Requests   Final    BOTTLES DRAWN AEROBIC AND ANAEROBIC Blood  Culture adequate volume   Culture   Final    NO GROWTH 5 DAYS Performed at [facility], [address], [place], [place] [address]    Report Status 12/17/2024 FINAL  Final     Radiology Studies: No results found.    LOS: 7 days   Time spent= 35 mins    Eric Nunnery, MD [facility]  If 7PM-7AM, please contact night-coverage  12/19/2024, 7:08 PM  "

## 2024-12-19 NOTE — Plan of Care (Signed)
   Problem: Clinical Measurements: Goal: Will remain free from infection Outcome: Progressing Goal: Diagnostic test results will improve Outcome: Progressing   Problem: Nutrition: Goal: Adequate nutrition will be maintained Outcome: Progressing

## 2024-12-19 NOTE — Plan of Care (Signed)
" °  Problem: Nutrition: Goal: Adequate nutrition will be maintained Outcome: Progressing   Problem: Elimination: Goal: Will not experience complications related to bowel motility Outcome: Progressing   Problem: Pain Managment: Goal: General experience of comfort will improve and/or be controlled Outcome: Progressing   Problem: Education: Goal: Knowledge of General Education information will improve Description: Including pain rating scale, medication(s)/side effects and non-pharmacologic comfort measures Outcome: Adequate for Discharge   Problem: Health Behavior/Discharge Planning: Goal: Ability to manage health-related needs will improve Outcome: Adequate for Discharge   Problem: Clinical Measurements: Goal: Ability to maintain clinical measurements within normal limits will improve Outcome: Adequate for Discharge   Problem: Activity: Goal: Risk for activity intolerance will decrease Outcome: Adequate for Discharge   Problem: Coping: Goal: Level of anxiety will decrease Outcome: Adequate for Discharge   Problem: Safety: Goal: Ability to remain free from injury will improve Outcome: Adequate for Discharge   Problem: Skin Integrity: Goal: Risk for impaired skin integrity will decrease Outcome: Adequate for Discharge   "

## 2024-12-19 NOTE — Progress Notes (Signed)
 "  Gastroenterology Progress Note   Referring Provider: No ref. provider found Primary Care Physician:  [name], FNP Primary Gastroenterologist:  Carlin POUR. Cindie, DO   Patient ID: Sandra Glover; [id number]; [birth year]   Subjective:    Bowels are moving. Stool this morning pretty loose. No blood in stool. Ate few bites of tomato soup yesterday (typically triggers her symptoms) and felt onset of her gallbladder attack type symptoms but did not become severe. In the most recent weeks, symptoms have begun after eating raw vegetables, tomato based foods. Trying to avoid since her attack 12/19. She is having some ruq pain today. Denies significant etoh use, rare occasion. Denies nsaids. Started tumeric after her gb like attack 12/19 but stopped after 1-2 weeks when she was advised to by our office. No other herbal medications or supplements.   Objective:   Vital signs in last 24 hours: Temp:  [97.9 F (36.6 C)-99.2 F (37.3 C)] 97.9 F (36.6 C) (01/15 0500) Pulse Rate:  [73-82] 73 (01/15 0500) Resp:  [18-19] 18 (01/15 0500) BP: (104-116)/(64-83) 104/64 (01/15 0500) SpO2:  [92 %-97 %] 92 % (01/15 0500) Last BM Date : 12/18/24 General:   Alert,  Well-developed, well-nourished, pleasant and cooperative in NAD. Looks uncomfortable Head:  Normocephalic and atraumatic. Eyes:  Sclera clear, no icterus.   Abdomen:  Soft,  nondistended. No masses, hepatosplenomegaly or hernias noted. Normal bowel sounds, without guarding, and without rebound.  Mild to moderate right mid to upper abdominal pain. Extremities:  Without clubbing, deformity or edema. Neurologic:  Alert and  oriented x4;  grossly normal neurologically. Skin:  Intact without significant lesions or rashes. Psych:  Alert and cooperative. Normal mood and affect.  Intake/Output from previous day: 01/14 0701 - 01/15 0700 In: 240 [P.O.:240] Out: -  Intake/Output this shift: No intake/output data recorded.  Lab  Results: CBC No results for input(s): WBC, HGB, HCT, MCV, PLT in the last 72 hours. BMET Recent Labs    12/17/24 0518 12/18/24 0511 12/19/24 0402  NA 140 139 138  K 4.3 3.9 3.8  CL 106 102 102  CO2 29 24 24   GLUCOSE 84 109* 95  BUN 6 <5* 6  CREATININE 0.73 0.83 0.86  CALCIUM 8.6* 9.1 8.8*   LFTs Recent Labs    12/17/24 0518 12/18/24 0511 12/19/24 0402  BILITOT 0.3 0.3 0.3  ALKPHOS 168* 180* 163*  AST 134* 73* 53*  ALT 171* 156* 122*  PROT 5.8* 6.4* 5.9*  ALBUMIN 3.7 4.0 3.8   No results for input(s): LIPASE in the last 72 hours. PT/INR No results for input(s): LABPROT, INR in the last 72 hours.       Imaging Studies: CT SOFT TISSUE NECK W CONTRAST Result Date: 12/17/2024 EXAM: CT NECK WITH CONTRAST 12/17/2024 04:30:10 PM TECHNIQUE: CT of the neck was performed with the administration of intravenous contrast. 75 mL of Omnipaque  was administered. Multiplanar reformatted images are provided for review. Automated exposure control, iterative reconstruction, and/or weight based adjustment of the mA/kV was utilized to reduce the radiation dose to as low as reasonably achievable. COMPARISON: CT cervical spine 12/14/2022. CLINICAL HISTORY: 39 year old female.  New onset left side neck pain. Soft tissue infection suspected, neck. Abnormal liver enzymes, recent ultrasound guided liver biopsy. FINDINGS: AERODIGESTIVE TRACT: At the larynx, the glottis is closed. No discrete mass. No edema. SALIVARY GLANDS: The parotid and submandibular glands are unremarkable. THYROID : Unremarkable. LYMPH NODES: Bilateral cervical lymph nodes appear stable since 2024 and normal. SOFT TISSUES:  No mass or fluid collection. 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 VASCULATURE: Major vascular structures in the bilateral neck and at the skull base are enhancing and patent. OTHER: Visualized  sinuses and mastoid air cells are well aerated. Visualized lungs are clear. IMPRESSION: 1. No acute or inflammatory process identified in the neck. Electronically signed by: Helayne Hurst MD 12/17/2024 05:46 PM EST RP Workstation: [id number]   DG Abd 1 View Result Date: 12/15/2024 EXAM: 1 VIEW XRAY OF THE ABDOMEN 12/15/2024 10:11:00 AM COMPARISON: CT abdomen and pelvis 12/12/2024. CLINICAL HISTORY: 39 year old female with abdominal pain. FINDINGS: BOWEL: Volume of bowel gas has increased from the recent CT, but the pattern is nonobstructed. SOFT TISSUES: Surgical clips in right upper quadrant. No abnormal calcifications. BONES: No acute fracture. IMPRESSION: 1. Non-obstructed bowel gas pattern.  No acute radiographic finding. Electronically signed by: Helayne Hurst MD MD 12/15/2024 10:34 AM EST RP Workstation: [id number]   US  BIOPSY (LIVER) Result Date: 12/13/2024 INDICATION: 39 year old female with history of elevated liver enzymes. EXAM: ULTRASOUND BIOPSY CORE LIVER MEDICATIONS: None. ANESTHESIA/SEDATION: Moderate (conscious) sedation was employed during this procedure. A total of Versed  3 mg and Fentanyl  100 mcg was administered intravenously. Moderate Sedation Time: 10 minutes. The patient's level of consciousness and vital signs were monitored continuously by radiology nursing throughout the procedure under my direct supervision. COMPLICATIONS: None immediate. PROCEDURE: Informed written consent was obtained from the patient after a thorough discussion of the procedural risks, benefits and alternatives. All questions were addressed. Maximal Sterile Barrier Technique was utilized including caps, mask, sterile gowns, sterile gloves, sterile drape, hand hygiene and skin antiseptic. A timeout was performed prior to the initiation of the procedure. Preprocedure ultrasound demonstrated safe window in the right upper quadrant for nonfocal liver biopsy. The right upper quadrant was prepped and draped in standard  fashion. Local anesthesia was administered subdermally at the planned entry site as well as under ultrasound guidance along the hepatic capsule. A skin nick was made. A 17 gauge introducer needle was advanced to the hepatic parenchyma under ultrasound guidance. Next, a total of 2, 18 gauge core biopsies were obtained. The samples were placed in formalin and sent to Pathology. Under ultrasound guidance, a Gel-Foam slurry was administered along the needle entry tract as the introducer needle was withdrawn. Postprocedure ultrasound demonstrated no evidence of perihepatic fluid collection. The patient tolerated the procedure well. IMPRESSION: Technically successful ultrasound-guided nonfocal core liver biopsy from the right lobe of the liver. Ester Sides, MD [facility] Electronically Signed   By: Ester Sides M.D.   On: 12/13/2024 13:22   US  LIVER DOPPLER Result Date: 12/13/2024 EXAM: RIGHT UPPER QUADRANT ULTRASOUND WITH DOPPLER EVALUATION 12/13/2024 09:32:12 AM TECHNIQUE: Grayscale and Doppler imaging was performed of the right upper quadrant. COMPARISON: 12/12/2024, 12/04/2024 CLINICAL HISTORY: [id number] Elevated LFTs [id number]; [id number] Abdominal pain [id number] FINDINGS: LIVER: Normal echogenicity. No evidence of intrahepatic biliary ductal dilatation. BILIARY SYSTEM: Cholecystectomy. No intrahepatic or extrahepatic biliary ductal dilation OTHER: No right upper quadrant ascites. VASCULAR: Doppler flow in the normal direction of the hepatic vasculature. Normal hepatopetal flow of the hepatic artery and portal vein. Normal hepatofugal flow of the hepatic veins. Normal doppler waveforms are visualized. The portal vein is of normal size and not dilated. Spleen: At the upper limits of normal for size, measuring 12.1 cm in craniocaudal dimension. IMPRESSION: 1. Patient portal vein. Otherwise, the hepatic vasculature demonstrates  normal vascular doppler flow. 2. The  spleen is at the upper limits of normal for size, measuring 12.1 cm. Electronically signed by: Rogelia Myers MD MD 12/13/2024 10:20 AM EST RP Workstation: [id number]   US  Abdomen Limited Result Date: 12/13/2024 EXAM: Right Upper Quadrant Abdominal Ultrasound 12/13/2024 09:32:27 AM TECHNIQUE: Real-time ultrasonography of the right upper quadrant of the abdomen was performed. COMPARISON: 12/12/2024 CT of the abdomen and pelvis ;12/04/2024 MR of the abdomen CLINICAL HISTORY: Abdominal pain. FINDINGS: LIVER: Normal echogenicity. No intrahepatic biliary ductal dilatation. No evidence of mass. Hepatopetal flow in the portal vein. BILIARY SYSTEM: Cholecystectomy. Common bile duct is within normal limits measuring 4.4 mm. OTHER: No right upper quadrant ascites. IMPRESSION: 1. Cholecystectomy. No intrahepatic or extrahepatic biliary ductal dilation. Electronically signed by: Rogelia Myers MD MD 12/13/2024 09:52 AM EST RP Workstation: [id number]   CT Renal Stone Study Result Date: 12/12/2024 EXAM: CT ABDOMEN AND PELVIS WITHOUT CONTRAST 12/12/2024 08:29:28 PM TECHNIQUE: CT of the abdomen and pelvis was performed without the administration of intravenous contrast. Multiplanar reformatted images are provided for review. Automated exposure control, iterative reconstruction, and/or weight-based adjustment of the mA/kV was utilized to reduce the radiation dose to as low as reasonably achievable. COMPARISON: CT abdomen and pelvis 11/22/2024. CLINICAL HISTORY: Abdominal/flank pain, stone suspected. FINDINGS: LOWER CHEST: No acute abnormality. LIVER: The liver is unremarkable. GALLBLADDER AND BILE DUCTS: Gallbladder is unremarkable. No biliary ductal dilatation. SPLEEN: No acute abnormality. PANCREAS: No acute abnormality. ADRENAL GLANDS: No acute abnormality. 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 There is stable scarring in the superior pole of the right kidney. No stones in the kidneys or  ureters. No hydronephrosis. No perinephric or periureteral stranding. Urinary bladder is unremarkable. GI AND BOWEL: Stomach demonstrates no acute abnormality. The appendix is within normal limits. There is no bowel obstruction. PERITONEUM AND RETROPERITONEUM: No ascites. No free air. VASCULATURE: Aorta is normal in caliber. LYMPH NODES: No lymphadenopathy. REPRODUCTIVE ORGANS: No acute abnormality. BONES AND SOFT TISSUES: No acute osseous abnormality. No focal soft tissue abnormality. IMPRESSION: 1. No acute findings in the abdomen or pelvis. 2. Stable scarring in the superior pole of the right kidney. Electronically signed by: Greig Pique MD MD 12/12/2024 08:39 PM EST RP Workstation: [id number]   DG Chest 1 View Result Date: 12/10/2024 EXAM: 1 VIEW(S) XRAY OF THE CHEST 12/10/2024 07:55:00 PM COMPARISON: 11/22/2024 CLINICAL HISTORY: Cough FINDINGS: LUNGS AND PLEURA: No focal pulmonary opacity. No pleural effusion. No pneumothorax. HEART AND MEDIASTINUM: No acute abnormality of the cardiac and mediastinal silhouettes. BONES AND SOFT TISSUES: No acute osseous abnormality. IMPRESSION: 1. No acute process. Electronically signed by: Greig Pique MD 12/10/2024 08:03 PM EST RP Workstation: [id number]   MR ABDOMEN MRCP W WO CONTAST Result Date: 12/04/2024 CLINICAL DATA:  Abdominal pain, history of partial right nephrectomy and cholecystectomy EXAM: MRI ABDOMEN WITHOUT AND WITH CONTRAST (INCLUDING MRCP) TECHNIQUE: Multiplanar multisequence MR imaging of the abdomen was performed both before and after the administration of intravenous contrast. Heavily T2-weighted images of the biliary and pancreatic ducts were obtained, and three-dimensional MRCP images were rendered by post processing. CONTRAST:  7mL GADAVIST  GADOBUTROL  1 MMOL/ML IV SOLN COMPARISON:  11/22/2024 FINDINGS: Lower chest: No acute abnormality. Hepatobiliary: No solid liver abnormality is seen. Cholecystectomy. No biliary ductal dilatation. Pancreas:  Unremarkable. No pancreatic ductal dilatation or surrounding inflammatory changes. Spleen: Normal in size without significant abnormality. Adrenals/Urinary Tract: Adrenal glands are unremarkable. 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 Kidneys are otherwise normal,  without renal calculi, solid lesion, or hydronephrosis. Stomach/Bowel: Stomach is within normal limits. No evidence of bowel wall thickening, distention, or inflammatory changes. Vascular/Lymphatic: No significant vascular findings are present. No enlarged abdominal lymph nodes. Other: No abdominal wall hernia or abnormality. No ascites. Musculoskeletal: No acute or significant osseous findings. IMPRESSION: 1. No acute MR findings of the abdomen to explain abdominal pain. 2. 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 3. Status post cholecystectomy.  No biliary ductal dilatation. Electronically Signed   By: Marolyn JONETTA Jaksch M.D.   On: 12/04/2024 13:53   MR 3D Recon At Scanner Result Date: 12/04/2024 CLINICAL DATA:  Abdominal pain, history of partial right nephrectomy and cholecystectomy EXAM: MRI ABDOMEN WITHOUT AND WITH CONTRAST (INCLUDING MRCP) TECHNIQUE: Multiplanar multisequence MR imaging of the abdomen was performed both before and after the administration of intravenous contrast. Heavily T2-weighted images of the biliary and pancreatic ducts were obtained, and three-dimensional MRCP images were rendered by post processing. CONTRAST:  7mL GADAVIST  GADOBUTROL  1 MMOL/ML IV SOLN COMPARISON:  11/22/2024 FINDINGS: Lower chest: No acute abnormality. Hepatobiliary: No solid liver abnormality is seen. Cholecystectomy. No biliary ductal dilatation. Pancreas: Unremarkable. No pancreatic ductal dilatation or surrounding inflammatory changes. Spleen: Normal in size without significant abnormality. Adrenals/Urinary Tract: Adrenal glands are unremarkable. Unchanged partial nephrectomy of the superior  pole of the right kidney. Kidneys are otherwise normal, without renal calculi, solid lesion, or hydronephrosis. Stomach/Bowel: Stomach is within normal limits. No evidence of bowel wall thickening, distention, or inflammatory changes. Vascular/Lymphatic: No significant vascular findings are present. No enlarged abdominal lymph nodes. Other: No abdominal wall hernia or abnormality. No ascites. Musculoskeletal: No acute or significant osseous findings. IMPRESSION: 1. No acute MR findings of the abdomen to explain abdominal pain. 2. 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 3. Status post cholecystectomy.  No biliary ductal dilatation. Electronically Signed   By: Marolyn JONETTA Jaksch M.D.   On: 12/04/2024 13:53   CT ABDOMEN PELVIS W CONTRAST Result Date: 11/22/2024 CLINICAL DATA:  Diffuse abdominal pain and rectal bleeding. EXAM: CT ABDOMEN AND PELVIS WITH CONTRAST TECHNIQUE: Multidetector CT imaging of the abdomen and pelvis was performed using the standard protocol following bolus administration of intravenous contrast. RADIATION DOSE REDUCTION: This exam was performed according to the departmental dose-optimization program which includes automated exposure control, adjustment of the mA and/or kV according to patient size and/or use of iterative reconstruction technique. CONTRAST:  OMNIPAQUE  IOHEXOL  300 MG/ML  SOLN COMPARISON:  July 05, 2023 FINDINGS: Lower chest: No acute abnormality. Hepatobiliary: No focal liver abnormality is seen. Status post cholecystectomy. No biliary dilatation. Pancreas: Unremarkable. No pancreatic ductal dilatation or surrounding inflammatory changes. Spleen: Normal in size without focal abnormality. Adrenals/Urinary Tract: Adrenal glands are unremarkable. Kidneys are normal, without renal calculi, focal lesion, or hydronephrosis. Bladder is unremarkable. Stomach/Bowel: Stomach is within normal limits. Appendix appears normal. No evidence of bowel wall  thickening, distention, or inflammatory changes. Vascular/Lymphatic: No significant vascular findings are present. No enlarged abdominal or pelvic lymph nodes. Reproductive: Uterus and bilateral adnexa are unremarkable. Other: No abdominal wall hernia or abnormality. No abdominopelvic ascites. Musculoskeletal: No acute or significant osseous findings. IMPRESSION: 1. No acute or active process within the abdomen or pelvis. 2. Evidence of prior cholecystectomy. Electronically Signed   By: Suzen Dials M.D.   On: 11/22/2024 19:23   DG Chest Portable 1 View Result Date: 11/22/2024 EXAM: 1 VIEW(S) XRAY OF THE CHEST 11/22/2024 05:05:00 PM COMPARISON: 06/24/2024 CLINICAL HISTORY:  chest pain FINDINGS: LUNGS AND PLEURA: No focal pulmonary opacity. No pleural effusion. No pneumothorax. HEART AND MEDIASTINUM: No acute abnormality of the cardiac and mediastinal silhouettes. BONES AND SOFT TISSUES: No acute osseous abnormality. IMPRESSION: 1. No acute cardiopulmonary abnormality. Electronically signed by: Greig Pique MD 11/22/2024 05:46 PM EST RP Workstation: [id number]  [7 weeks]  Assessment:   Sandra Glover is a 39 year old female with history of anxiety/depression, IBS with constipation, HTN, GERD, and recent chronic abdominal pain with history of cholecystectomy who presented to the ED with complaints of hematuria, lower abdominal pain, nausea, and weakness.  GI consulted for elevated LFTs and further evaluation of her abdominal pain which recently acutely worsened.    Elevated LFTs:  -etiology unclear.   -Status post cholecystectomy 2011, chronic cholecystitis with viscous bile but no calculi on path -recurrent episodes of RUQ pain associated with N/V often postprandially. Episode 12/19 and 1/6. Increased frequency of episodes over past 8 months.  -12/11/2023: Tbili <0.2, AP 163, AST 18, ALT 19 -LFTs have been trending up and down since 11/22/24 acute onset RUQ pain and N/V AST  191>>66>>27>>495>>232>>62>>134>>73>>53 ALT 85>>245>>256>>26>>393>>289>>204>>138>>171>>156>>122 -Alkaline phosphatase 144>> 164>> 168>> 142>> 206>> 173>>182>>194>>168>>180>>163 (she had alkaline phosphatase of 163 12/2023) -ANA positive though immunoglobulin levels normal.  -Antimicrosomal Ab Liver/kidney negative -A1A phenotype normal, ceruoloplasmin normal -ASMA negative  -previous IgG and IgM WNL, AMA negative as outpatient  -GGT 238>>94 -Negative for viral hepatitis.   -Ceruloplasmin, HSV, CMV, EBV unremarkable.   -Imaging including CT, MRI/MRCP, ultrasound reassuring. -Underwent liver biopsy 12/13/2024, results pending. -improving some today: AST 53 (73), ALT 122 (156) Alk phos 163 (180) -tumeric for 1-2 weeks after episode of abdominal pain 11/22/24. No other supplements   Abdominal pain -most upper abdominal pain with some sharp pains diffusely -pain worse with eating, worse with certain foods associated with vomiting  -endorses sensation of phantom GB attacks (s/p chole in 2011), pains similar to prior to getting her gallbladder removed. Episodes started about 8 months ago and becoming more frequent. -takes mobic PRN at home for neck pain, reports using only rarely. No other NSAIDs. No etoh of significance. -some improvement with Carafate  -underwent EGD Monday - Normal esophagus.             - Small hiatal hernia. Antral erosions with 2 areas of healing ulceration?status post biopsy             - Denuded areas of duodenal bulb and second  portion?status post biopsy  EGD Biopsy results:  A. STOMACH, BIOPSY:  Reactive gastropathy with focal activity and mild chronic gastritis  Negative for H. pylori, intestinal metaplasia, dysplasia and carcinoma   B. DUODENAL BIOPSY:  Mild acute duodenitis with gastric metaplasia and focal erosion  consistent with peptic duodenitis    Patient aware of EGD biopsy results   She continues to deny any NSAIDs and states no mobic for the past  month, uses very infrequently    Given her concomitant abdominal pain and episodic nature of her symptoms, sphincter of Oddi dysfunction also on differential. Bump in her LFTs seem to correlate with her episodes of abdominal pain and vomiting. May benefit from further evaluation as outpatient, ie EUS to rule out microlithiasis vs sphincter of Oddi manometry.   Chronic constipation: -Previously well-controlled on Trulance , now not covered.   -no BM since 1/10, finally had a good BM yesterday and today  -Continue Linzess  290 mcg daily.   -Continue MiraLAX  17g TID  -Likely worsened in the setting  of ongoing opioid analgesia requirement. -Chronic issues in the setting of rectocele    Rectal bleeding: -noted on stool and on toilet tissue, some clots at times  -mild, likely hemorrhoidal in the setting of constipation -intermittent over the past 3-4 months  -last TCS 2017, history of hemorrhoidectomy in 2017 -Consider outpatient colonoscopy to further evaluate    Plan:   Continue to trend LFTs. Continue PPI BID. Continue carafate  1g QID. Continue Linzess  290mcg daily. Continue Miralax  17g TID for now. Goal of dose reduction to BID. Outpatient colonoscopy for rectal bleeding. Await liver biopsy. May require outpatient work up for Sphincter of Oddi dysfunction, rule out microlithiasis.  Avoid food triggers.    LOS: 7 days   Sandra Glover. Sandra Glover [facility] [phone] 1/15/20267:16 AM    "

## 2024-12-20 ENCOUNTER — Telehealth: Payer: Self-pay | Admitting: Gastroenterology

## 2024-12-20 DIAGNOSIS — K589 Irritable bowel syndrome without diarrhea: Secondary | ICD-10-CM | POA: Diagnosis not present

## 2024-12-20 DIAGNOSIS — K5909 Other constipation: Secondary | ICD-10-CM | POA: Diagnosis not present

## 2024-12-20 DIAGNOSIS — K625 Hemorrhage of anus and rectum: Secondary | ICD-10-CM | POA: Diagnosis not present

## 2024-12-20 DIAGNOSIS — R1011 Right upper quadrant pain: Secondary | ICD-10-CM | POA: Diagnosis not present

## 2024-12-20 DIAGNOSIS — Z9049 Acquired absence of other specified parts of digestive tract: Secondary | ICD-10-CM | POA: Diagnosis not present

## 2024-12-20 DIAGNOSIS — K5904 Chronic idiopathic constipation: Secondary | ICD-10-CM | POA: Diagnosis not present

## 2024-12-20 DIAGNOSIS — R1010 Upper abdominal pain, unspecified: Secondary | ICD-10-CM | POA: Diagnosis not present

## 2024-12-20 DIAGNOSIS — R7401 Elevation of levels of liver transaminase levels: Secondary | ICD-10-CM | POA: Diagnosis not present

## 2024-12-20 LAB — COMPREHENSIVE METABOLIC PANEL WITH GFR
ALT: 96 U/L — ABNORMAL HIGH (ref 0–44)
AST: 39 U/L (ref 15–41)
Albumin: 3.6 g/dL (ref 3.5–5.0)
Alkaline Phosphatase: 152 U/L — ABNORMAL HIGH (ref 38–126)
Anion gap: 12 (ref 5–15)
BUN: 7 mg/dL (ref 6–20)
CO2: 23 mmol/L (ref 22–32)
Calcium: 8.5 mg/dL — ABNORMAL LOW (ref 8.9–10.3)
Chloride: 104 mmol/L (ref 98–111)
Creatinine, Ser: 0.83 mg/dL (ref 0.44–1.00)
GFR, Estimated: >60 mL/min
Glucose, Bld: 91 mg/dL (ref 70–99)
Potassium: 3.7 mmol/L (ref 3.5–5.1)
Sodium: 139 mmol/L (ref 135–145)
Total Bilirubin: 0.3 mg/dL (ref 0.0–1.2)
Total Protein: 5.7 g/dL — ABNORMAL LOW (ref 6.5–8.1)

## 2024-12-20 MED ORDER — HYOSCYAMINE SULFATE 0.125 MG SL SUBL: 0.1250 mg | SUBLINGUAL_TABLET | Freq: Four times a day (QID) | SUBLINGUAL | Status: DC | PRN

## 2024-12-20 NOTE — Plan of Care (Signed)

## 2024-12-20 NOTE — Progress Notes (Signed)
 "  Gastroenterology Progress Note   Referring Provider: No ref. provider found Primary Care Physician:  [name], FNP Primary Gastroenterologist:  Carlin POUR. Cindie, DO   Patient ID: Sandra Glover; [id number]; 10-14-1986    Subjective   Having some fairly frequent belching, still very nervous about diet.  Stools were little bit looser today but notes that yesterday she had some more formed solid pieces with looser stool.  Had 2 bowel movements yesterday.  Up until couple days ago had not had any bowel movements in about a week.  Still having upper abdominal pain mostly in the right upper quadrant and somewhat in the epigastrium.  Objective   Vital signs in last 24 hours Temp:  [97.6 F (36.4 C)-98.6 F (37 C)] 97.9 F (36.6 C) (01/16 0356) Pulse Rate:  [76-79] 76 (01/16 0356) Resp:  [18] 18 (01/16 0356) BP: (94-128)/(59-79) 94/59 (01/16 0356) SpO2:  [92 %-93 %] 93 % (01/16 0356) Last BM Date : 12/19/24  Physical Exam General:   Alert and oriented, pleasant Head:  Normocephalic and atraumatic. Eyes:  No icterus, sclera clear. Conjuctiva pink.  Mouth:  Without lesions, mucosa pink and moist.  Abdomen:  Bowel sounds present, soft, non-distended. Ttp to RUQ and epigastrium. No HSM or hernias noted. No rebound or guarding. No masses appreciated  Neurologic:  Alert and  oriented x4;  grossly normal neurologically. Skin:  Warm and dry, intact without significant lesions.  Psych:  Alert and cooperative. Normal mood and affect.  Intake/Output from previous day: 01/15 0701 - 01/16 0700 In: 1230 [P.O.:480; I.V.:750] Out: -  Intake/Output this shift: Total I/O In: 240 [P.O.:240] Out: -   Lab Results  No results for input(s): WBC, HGB, HCT, PLT in the last 72 hours. BMET Recent Labs    12/18/24 0511 12/19/24 0402 12/20/24 0538  NA 139 138 139  K 3.9 3.8 3.7  CL 102 102 104  CO2 24 24 23   GLUCOSE 109* 95 91  BUN <5* 6 7  CREATININE 0.83 0.86 0.83   CALCIUM 9.1 8.8* 8.5*   LFT Recent Labs    12/18/24 0511 12/19/24 0402 12/20/24 0538  PROT 6.4* 5.9* 5.7*  ALBUMIN 4.0 3.8 3.6  AST 73* 53* 39  ALT 156* 122* 96*  ALKPHOS 180* 163* 152*  BILITOT 0.3 0.3 0.3   PT/INR No results for input(s): LABPROT, INR in the last 72 hours. Hepatitis Panel No results for input(s): HEPBSAG, HCVAB, HEPAIGM, HEPBIGM in the last 72 hours.  Studies/Results CT SOFT TISSUE NECK W CONTRAST Result Date: 12/17/2024 EXAM: CT NECK WITH CONTRAST 12/17/2024 04:30:10 PM TECHNIQUE: CT of the neck was performed with the administration of intravenous contrast. 75 mL of Omnipaque  was administered. Multiplanar reformatted images are provided for review. Automated exposure control, iterative reconstruction, and/or weight based adjustment of the mA/kV was utilized to reduce the radiation dose to as low as reasonably achievable. COMPARISON: CT cervical spine 12/14/2022. CLINICAL HISTORY: 39 year old female.  New onset left side neck pain. Soft tissue infection suspected, neck. Abnormal liver enzymes, recent ultrasound guided liver biopsy. FINDINGS: AERODIGESTIVE TRACT: At the larynx, the glottis is closed. No discrete mass. No edema. SALIVARY GLANDS: The parotid and submandibular glands are unremarkable. THYROID : Unremarkable. LYMPH NODES: Bilateral cervical lymph nodes appear stable since 2024 and normal. SOFT TISSUES: No mass or fluid collection. 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 VASCULATURE: Major vascular structures in the bilateral  neck and at the skull base are enhancing and patent. OTHER: Visualized sinuses and mastoid air cells are well aerated. Visualized lungs are clear. IMPRESSION: 1. No acute or inflammatory process identified in the neck. Electronically signed by: Helayne Hurst MD 12/17/2024 05:46 PM EST RP Workstation: [id number]   DG Abd 1  View Result Date: 12/15/2024 EXAM: 1 VIEW XRAY OF THE ABDOMEN 12/15/2024 10:11:00 AM COMPARISON: CT abdomen and pelvis 12/12/2024. CLINICAL HISTORY: 39 year old female with abdominal pain. FINDINGS: BOWEL: Volume of bowel gas has increased from the recent CT, but the pattern is nonobstructed. SOFT TISSUES: Surgical clips in right upper quadrant. No abnormal calcifications. BONES: No acute fracture. IMPRESSION: 1. Non-obstructed bowel gas pattern.  No acute radiographic finding. Electronically signed by: Helayne Hurst MD MD 12/15/2024 10:34 AM EST RP Workstation: [id number]   US  BIOPSY (LIVER) Result Date: 12/13/2024 INDICATION: 39 year old female with history of elevated liver enzymes. EXAM: ULTRASOUND BIOPSY CORE LIVER MEDICATIONS: None. ANESTHESIA/SEDATION: Moderate (conscious) sedation was employed during this procedure. A total of Versed  3 mg and Fentanyl  100 mcg was administered intravenously. Moderate Sedation Time: 10 minutes. The patient's level of consciousness and vital signs were monitored continuously by radiology nursing throughout the procedure under my direct supervision. COMPLICATIONS: None immediate. PROCEDURE: Informed written consent was obtained from the patient after a thorough discussion of the procedural risks, benefits and alternatives. All questions were addressed. Maximal Sterile Barrier Technique was utilized including caps, mask, sterile gowns, sterile gloves, sterile drape, hand hygiene and skin antiseptic. A timeout was performed prior to the initiation of the procedure. Preprocedure ultrasound demonstrated safe window in the right upper quadrant for nonfocal liver biopsy. The right upper quadrant was prepped and draped in standard fashion. Local anesthesia was administered subdermally at the planned entry site as well as under ultrasound guidance along the hepatic capsule. A skin nick was made. A 17 gauge introducer needle was advanced to the hepatic parenchyma under ultrasound  guidance. Next, a total of 2, 18 gauge core biopsies were obtained. The samples were placed in formalin and sent to Pathology. Under ultrasound guidance, a Gel-Foam slurry was administered along the needle entry tract as the introducer needle was withdrawn. Postprocedure ultrasound demonstrated no evidence of perihepatic fluid collection. The patient tolerated the procedure well. IMPRESSION: Technically successful ultrasound-guided nonfocal core liver biopsy from the right lobe of the liver. Ester Sides, MD [facility] Electronically Signed   By: Ester Sides M.D.   On: 12/13/2024 13:22   US  LIVER DOPPLER Result Date: 12/13/2024 EXAM: RIGHT UPPER QUADRANT ULTRASOUND WITH DOPPLER EVALUATION 12/13/2024 09:32:12 AM TECHNIQUE: Grayscale and Doppler imaging was performed of the right upper quadrant. COMPARISON: 12/12/2024, 12/04/2024 CLINICAL HISTORY: [id number] Elevated LFTs [id number]; [id number] Abdominal pain [id number] FINDINGS: LIVER: Normal echogenicity. No evidence of intrahepatic biliary ductal dilatation. BILIARY SYSTEM: Cholecystectomy. No intrahepatic or extrahepatic biliary ductal dilation OTHER: No right upper quadrant ascites. VASCULAR: Doppler flow in the normal direction of the hepatic vasculature. Normal hepatopetal flow of the hepatic artery and portal vein. Normal hepatofugal flow of the hepatic veins. Normal doppler waveforms are visualized. The portal vein is of normal size and not dilated. Spleen: At the upper limits of normal for size, measuring 12.1 cm in craniocaudal dimension. IMPRESSION: 1. Patient portal vein. Otherwise, the hepatic vasculature demonstrates normal vascular doppler flow. 2. The spleen is at the upper limits of normal for size, measuring 12.1 cm. Electronically signed by: Rogelia Myers MD MD 12/13/2024 10:20 AM EST RP Workstation:  [id number]   US  Abdomen Limited Result Date: 12/13/2024 EXAM: Right Upper Quadrant Abdominal  Ultrasound 12/13/2024 09:32:27 AM TECHNIQUE: Real-time ultrasonography of the right upper quadrant of the abdomen was performed. COMPARISON: 12/12/2024 CT of the abdomen and pelvis ;12/04/2024 MR of the abdomen CLINICAL HISTORY: Abdominal pain. FINDINGS: LIVER: Normal echogenicity. No intrahepatic biliary ductal dilatation. No evidence of mass. Hepatopetal flow in the portal vein. BILIARY SYSTEM: Cholecystectomy. Common bile duct is within normal limits measuring 4.4 mm. OTHER: No right upper quadrant ascites. IMPRESSION: 1. Cholecystectomy. No intrahepatic or extrahepatic biliary ductal dilation. Electronically signed by: Rogelia Myers MD MD 12/13/2024 09:52 AM EST RP Workstation: [id number]   CT Renal Stone Study Result Date: 12/12/2024 EXAM: CT ABDOMEN AND PELVIS WITHOUT CONTRAST 12/12/2024 08:29:28 PM TECHNIQUE: CT of the abdomen and pelvis was performed without the administration of intravenous contrast. Multiplanar reformatted images are provided for review. Automated exposure control, iterative reconstruction, and/or weight-based adjustment of the mA/kV was utilized to reduce the radiation dose to as low as reasonably achievable. COMPARISON: CT abdomen and pelvis 11/22/2024. CLINICAL HISTORY: Abdominal/flank pain, stone suspected. FINDINGS: LOWER CHEST: No acute abnormality. LIVER: The liver is unremarkable. GALLBLADDER AND BILE DUCTS: Gallbladder is unremarkable. No biliary ductal dilatation. SPLEEN: No acute abnormality. PANCREAS: No acute abnormality. ADRENAL GLANDS: No acute abnormality. 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 There is stable scarring in the superior pole of the right kidney. No stones in the kidneys or ureters. No hydronephrosis. No perinephric or periureteral stranding. Urinary bladder is unremarkable. GI AND BOWEL: Stomach demonstrates no acute abnormality. The appendix is within normal limits. There is no bowel obstruction. PERITONEUM AND  RETROPERITONEUM: No ascites. No free air. VASCULATURE: Aorta is normal in caliber. LYMPH NODES: No lymphadenopathy. REPRODUCTIVE ORGANS: No acute abnormality. BONES AND SOFT TISSUES: No acute osseous abnormality. No focal soft tissue abnormality. IMPRESSION: 1. No acute findings in the abdomen or pelvis. 2. Stable scarring in the superior pole of the right kidney. Electronically signed by: Greig Pique MD MD 12/12/2024 08:39 PM EST RP Workstation: [id number]   DG Chest 1 View Result Date: 12/10/2024 EXAM: 1 VIEW(S) XRAY OF THE CHEST 12/10/2024 07:55:00 PM COMPARISON: 11/22/2024 CLINICAL HISTORY: Cough FINDINGS: LUNGS AND PLEURA: No focal pulmonary opacity. No pleural effusion. No pneumothorax. HEART AND MEDIASTINUM: No acute abnormality of the cardiac and mediastinal silhouettes. BONES AND SOFT TISSUES: No acute osseous abnormality. IMPRESSION: 1. No acute process. Electronically signed by: Greig Pique MD 12/10/2024 08:03 PM EST RP Workstation: [id number]   MR ABDOMEN MRCP W WO CONTAST Result Date: 12/04/2024 CLINICAL DATA:  Abdominal pain, history of partial right nephrectomy and cholecystectomy EXAM: MRI ABDOMEN WITHOUT AND WITH CONTRAST (INCLUDING MRCP) TECHNIQUE: Multiplanar multisequence MR imaging of the abdomen was performed both before and after the administration of intravenous contrast. Heavily T2-weighted images of the biliary and pancreatic ducts were obtained, and three-dimensional MRCP images were rendered by post processing. CONTRAST:  7mL GADAVIST  GADOBUTROL  1 MMOL/ML IV SOLN COMPARISON:  11/22/2024 FINDINGS: Lower chest: No acute abnormality. Hepatobiliary: No solid liver abnormality is seen. Cholecystectomy. No biliary ductal dilatation. Pancreas: Unremarkable. No pancreatic ductal dilatation or surrounding inflammatory changes. Spleen: Normal in size without significant abnormality. Adrenals/Urinary Tract: Adrenal glands are unremarkable. Unchanged partial nephrectomy of the superior pole  of the right kidney. Kidneys are otherwise normal, without renal calculi, solid lesion, or hydronephrosis. Stomach/Bowel: Stomach is within normal limits. No evidence of bowel wall thickening, distention, or inflammatory changes. Vascular/Lymphatic: No significant vascular findings are present. No enlarged  abdominal lymph nodes. Other: No abdominal wall hernia or abnormality. No ascites. Musculoskeletal: No acute or significant osseous findings. IMPRESSION: 1. No acute MR findings of the abdomen to explain abdominal pain. 2. 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 3. Status post cholecystectomy.  No biliary ductal dilatation. Electronically Signed   By: Marolyn JONETTA Jaksch M.D.   On: 12/04/2024 13:53   MR 3D Recon At Scanner Result Date: 12/04/2024 CLINICAL DATA:  Abdominal pain, history of partial right nephrectomy and cholecystectomy EXAM: MRI ABDOMEN WITHOUT AND WITH CONTRAST (INCLUDING MRCP) TECHNIQUE: Multiplanar multisequence MR imaging of the abdomen was performed both before and after the administration of intravenous contrast. Heavily T2-weighted images of the biliary and pancreatic ducts were obtained, and three-dimensional MRCP images were rendered by post processing. CONTRAST:  7mL GADAVIST  GADOBUTROL  1 MMOL/ML IV SOLN COMPARISON:  11/22/2024 FINDINGS: Lower chest: No acute abnormality. Hepatobiliary: No solid liver abnormality is seen. Cholecystectomy. No biliary ductal dilatation. Pancreas: Unremarkable. No pancreatic ductal dilatation or surrounding inflammatory changes. Spleen: Normal in size without significant abnormality. Adrenals/Urinary Tract: Adrenal glands are unremarkable. 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 Kidneys are otherwise normal, without renal calculi, solid lesion, or hydronephrosis. Stomach/Bowel: Stomach is within normal limits. No evidence of bowel wall thickening, distention, or inflammatory changes.  Vascular/Lymphatic: No significant vascular findings are present. No enlarged abdominal lymph nodes. Other: No abdominal wall hernia or abnormality. No ascites. Musculoskeletal: No acute or significant osseous findings. IMPRESSION: 1. No acute MR findings of the abdomen to explain abdominal pain. 2. 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 3. Status post cholecystectomy.  No biliary ductal dilatation. Electronically Signed   By: Marolyn JONETTA Jaksch M.D.   On: 12/04/2024 13:53   CT ABDOMEN PELVIS W CONTRAST Result Date: 11/22/2024 CLINICAL DATA:  Diffuse abdominal pain and rectal bleeding. EXAM: CT ABDOMEN AND PELVIS WITH CONTRAST TECHNIQUE: Multidetector CT imaging of the abdomen and pelvis was performed using the standard protocol following bolus administration of intravenous contrast. RADIATION DOSE REDUCTION: This exam was performed according to the departmental dose-optimization program which includes automated exposure control, adjustment of the mA and/or kV according to patient size and/or use of iterative reconstruction technique. CONTRAST:  OMNIPAQUE  IOHEXOL  300 MG/ML  SOLN COMPARISON:  July 05, 2023 FINDINGS: Lower chest: No acute abnormality. Hepatobiliary: No focal liver abnormality is seen. Status post cholecystectomy. No biliary dilatation. Pancreas: Unremarkable. No pancreatic ductal dilatation or surrounding inflammatory changes. Spleen: Normal in size without focal abnormality. Adrenals/Urinary Tract: Adrenal glands are unremarkable. Kidneys are normal, without renal calculi, focal lesion, or hydronephrosis. Bladder is unremarkable. Stomach/Bowel: Stomach is within normal limits. Appendix appears normal. No evidence of bowel wall thickening, distention, or inflammatory changes. Vascular/Lymphatic: No significant vascular findings are present. No enlarged abdominal or pelvic lymph nodes. Reproductive: Uterus and bilateral adnexa are unremarkable. Other: No abdominal  wall hernia or abnormality. No abdominopelvic ascites. Musculoskeletal: No acute or significant osseous findings. IMPRESSION: 1. No acute or active process within the abdomen or pelvis. 2. Evidence of prior cholecystectomy. Electronically Signed   By: Suzen Dials M.D.   On: 11/22/2024 19:23   DG Chest Portable 1 View Result Date: 11/22/2024 EXAM: 1 VIEW(S) XRAY OF THE CHEST 11/22/2024 05:05:00 PM COMPARISON: 06/24/2024 CLINICAL HISTORY: chest pain FINDINGS: LUNGS AND PLEURA: No focal pulmonary opacity. No pleural effusion. No pneumothorax. HEART AND MEDIASTINUM: No acute abnormality of the cardiac and mediastinal silhouettes. BONES AND SOFT TISSUES: No  acute osseous abnormality. IMPRESSION: 1. No acute cardiopulmonary abnormality. Electronically signed by: Greig Pique MD 11/22/2024 05:46 PM EST RP Workstation: [id number]    Assessment  39 y.o. female with a history of anxiety/depression, IBS-C, HTN, GERD, and chronic upper abdominal pain with history of cholecystectomy who presented to the ED with hematuria, lower abdominal pain, upper abdominal pain, nausea, and weakness.  GI consulted given transaminitis and further evaluation of abdominal pain.  Elevated LFTs: - S/p cholecystectomy 1011 secondary to chronic cholecystitis with viscous bile but no calculi on path. -Patient was having recurrent episodes of RUQ pain associated with nausea and vomiting postprandially.  Has been intermittent but recently increasing in frequency over the last 8 months. -LFTs have been up and down since 12/19 at time of an acute episode of RUQ pain with nausea and vomiting.      AST 191>> 66>> 27>> 395>> 232>> 62>> 134>> 73>> 53>> 39      ALT 85>> 245>> 256>> 26>> 393>> 289>> 204>> 138>> 171>> 156>> 122>>96      Alk Phos 144>>164>>168>>142>>206>>173>>182>>194>> 168> 180>>163>> 152 - Workup thus far with ANA positive with normal immunoglobulins, antiliver kidney antibody negative, A1A phenotype normal,  ceruloplasmin normal, ASMA negative, IgG and IgM normal, AMA negative, elevated GGT, negative viral hepatitis, CMV, EBV, HSV unremarkable -CT, MRI/MRCP, and ultrasound reassuring without any biliary ductal dilation or obvious/occult choledocholithiasis -Began having some mild elevation of LFTs even prior to turmeric use but did admit to this for about 1-2 weeks  Liver biopsy with mild non specific changes including minimal steatosis, sinusoidal dilatation, scan portal and lobular inflammation, no pathologic fibrosis. No evidence of AIH, Dili possibility.  Despite having some lobular necrotizing inflammation and portal inflammation with interface hepatitis AIH is less likely given overall negative autoimmune antibody workup, this can be seen commonly in MASH/MASLD,   As of now suspect mostly that elevation in LFTs is secondary to [facility] as well as prior significant elevation secondary to possible Dili and/or Sphincter of Oddi dysfunction (Type II).  Will have her referred to [facility] or [facility] for EUS +/- ERCP to rule out occult stone and potentially perform sphincterotomy.  I gave her written education about sphincter of Oddi dysfunction as well as potential treatment options and information about management of symptoms and diet.  Advised that opioid pain medication likely can worsen sphincter of Oddi therefore we will trial Levsin  as an antispasmodic to help manage pain.  Abdominal pain: - Primarily upper abdominal pain, at times more in the RUQ but usually across RUQ and epigastrium. - Continues to have symptoms worsened postprandially, trying to identify trigger foods - Has endorsed symptoms of phantom gallbladder attacks similar to what she experienced prior to cholecystectomy - Previously taking Mobic at home as needed for neck pain but no other NSAIDs or significant EtOH use - Underwent EGD Monday 12/16/2024  Normal esophagus  Small hiatal hernia  Antral erosions with 2 areas of healing  ulceration s/p biopsy  Denuded areas of duodenal bulb and second portion?  S/p biopsy  Pathology results with reactive gastropathy with focal activity and mild chronic gastritis and mild acute duodenitis with gastric metaplasia and focal erosion consistent with peptic duodenitis and negative for H. Pylori - It is most likely that her upper abdominal pain is secondary to sphincter of Oddi dysfunction as noted above and as well as gastritis and duodenitis.  Observed frequent belching of the patient today during examination - Will continue on PPI twice daily given presence of  healing ulcers and ongoing erosions.  Chronic constipation, IBS: - Had previously been well-controlled on Trulance  outpatient however insurance coverage declined so currently on Linzess  - Awaiting appeal of Trulance  outpatient - Had not had a bowel movement in about a week since prior to admission however began having bowel movements for the last 48 hours which are looser in nature - For now we will continue Linzess  290 mcg daily and MiraLAX  twice daily - Will need to increase MiraLAX  to 3 times daily outpatient if Levsin  worsens constipation  Rectal bleeding: - Noted within the stool and on toilet tissue with occasional clots - primarily occurring with bowel movements therefore suspect most likely hemorrhoidal in nature given has been intermittent even prior to hospitalization - History of hemorrhoidectomy in 2017 and this was also timing of last colonoscopy - Will plan for outpatient colonoscopy after upper GI symptoms are improved. - Continue hemorrhoid cream as needed.  Plan / Recommendations  Continue PPI BID Levsin  0.125 mg SL every 6 hours as needed.  Continue carafate  1g QID for 3 more weeks post discharge.  Continue Linzess  290 mcg daily (working on outpatient appeal for Trulance ) Miralax  17g BID Limit opioid use. Plan for eventual outpatient colonoscopy  Gave education regarding potential Sphincter of Oddi  dysfunction Low fat diet and avoidance of trigger foods (handout provided) - could do food diary Will refer to [facility] vs [facility] for EUS +/- ERCP and potential sphincter of oddi manometry vs sphincterotomy Advance diet as tolerated  Ok for discharge from GI standpoint within the next 24 hours- follow up will be arranged   LOS: 8 days    12/20/2024, 11:36 AM   Charmaine Melia, MSN, FNP-BC, AGACNP-BC [facility]   "

## 2024-12-20 NOTE — Plan of Care (Signed)
" °  Problem: Health Behavior/Discharge Planning: Goal: Ability to manage health-related needs will improve Outcome: Progressing   Problem: Clinical Measurements: Goal: Ability to maintain clinical measurements within normal limits will improve Outcome: Progressing   Problem: Activity: Goal: Risk for activity intolerance will decrease Outcome: Progressing   Problem: Coping: Goal: Level of anxiety will decrease Outcome: Progressing   Problem: Elimination: Goal: Will not experience complications related to bowel motility Outcome: Progressing   Problem: Safety: Goal: Ability to remain free from injury will improve Outcome: Progressing   Problem: Education: Goal: Knowledge of General Education information will improve Description: Including pain rating scale, medication(s)/side effects and non-pharmacologic comfort measures Outcome: Adequate for Discharge   Problem: Nutrition: Goal: Adequate nutrition will be maintained Outcome: Not Progressing   Problem: Pain Managment: Goal: General experience of comfort will improve and/or be controlled Outcome: Not Progressing   "

## 2024-12-20 NOTE — Progress Notes (Signed)
 Nurse at bedside patient has slight gas pain in abd. Awaiting medication refill in staff pixas to give. No other complaints or questions at this time.

## 2024-12-20 NOTE — Progress Notes (Signed)
 " PROGRESS NOTE    Sandra Glover  FMW:[id number] DOB: 03-26-1986 DOA: 12/12/2024  PCP: [name], FNP   Brief narrative: 39 year old female with history of HTN, anxiety, IBS, GERD, chronic abdominal pain presents to the ED with hematuria and right lower abdominal pain.  Workup showed transaminitis, CT abdomen pelvis was unremarkable.  She was started on IV Rocephin  due to concerns of urinary tract infection.  GI team was consulted.  MRCP was unremarkable.  EGD on 1/12 showed normal esophagus, small hiatal hernia and antral healing erosion/ulcer.   Assessment and plan: 1-acute on chronic abdominal pain/transaminitis - So far tolerating full liquid diet - GI service on board and will follow recommendation. - Continue twice a day PPI - Liver biopsy results demonstrating mild nonspecific changes including minimal asteatosis, sinusoidal dilatation, and a scattered portal and lobular inflammation.  There is no pathologic fibrosis.  No evidence of autoimmune hepatitis. - Continue to follow biopsy results from endoscopic evaluation. -Continue using Linzess . - Patient advised to continue maintaining adequate oral hydration. - LFTs continue trending down. - Will minimize the use of opiates; continue simethicone  and the use of Levsin .  Continue advancing diet.  Follow Toradol  prior to discharge based on GI recommendations.  2-hematuria - Resolved - There was initial concern for possible UTI, patient empirically was covered with Rocephin ; urinalysis/culture demonstrating no growth and antibiotic has been discontinued. - Patient advised to maintain adequate hydration.  3-left neck pain - CT scan demonstrating no acute abnormality - Continue analgesic and muscle relaxant.  4-essential hypertension - Continue current antihypertensive agents - Follow-up vital signs.  5-GERD - As mentioned above continue PPI and also Carafate  as recommended by GI service.  6-constipation - Continue  Colace, MiraLAX  and Linzess .  7-history of ADHD - Continue Adderall.  8-anxiety/bipolar disorder - Continue Prozac  and as needed Xanax . - Overall stable mood appreciated.   Diet Orders (From admission, onward)     Start     Ordered   12/16/24 1647  Diet full liquid Room service appropriate? Yes; Fluid consistency: Thin  Diet effective now       Question Answer Comment  Room service appropriate? Yes   Fluid consistency: Thin      12/16/24 1647           Subjective: No fever, no chest pain, no vomiting.  Patient reports no shortness of breath and he is feeling better today.  Still with intermittent nausea and complaining of abdominal pain/bloating.  Objective: Vitals:   12/19/24 1331 12/19/24 2035 12/20/24 0356 12/20/24 1347  BP: 128/79 120/75 (!) 94/59 111/67  Pulse: 79 76 76 88  Resp: 18 18 18 20   Temp: 98.6 F (37 C) 97.6 F (36.4 C) 97.9 F (36.6 C) 98.7 F (37.1 C)  TempSrc: Oral Oral Oral Oral  SpO2: 92% 93% 93% 93%  Weight:      Height:       General exam: Alert, awake, oriented x 3; reports no vomiting.  Still with ongoing abdominal pain. Respiratory system: Good saturation on room air. Cardiovascular system:RRR. No murmurs, rubs, gallops. Gastrointestinal system: Abdomen is nondistended, soft and without guarding.  Positive bowel sounds appreciated. Central nervous system: Alert and oriented. No focal neurological deficits. Extremities: No C/C/E, +pedal pulses Skin: No rashes, lesions or ulcers Psychiatry: Judgement and insight appear normal. Mood & affect appropriate.    Intake/Output Summary (Last 24 hours) at 12/20/2024 1725 Last data filed at 12/20/2024 1347 Gross per 24 hour  Intake 1230 ml  Output --  Net 1230 ml   Filed Weights   12/12/24 1923 12/16/24 1049  Weight: 74.8 kg 74.8 kg    Scheduled Meds:  amphetamine -dextroamphetamine   20 mg Oral BID   docusate sodium   100 mg Oral Daily   feeding supplement  237 mL Oral TID BM   FLUoxetine    40 mg Oral Daily   linaclotide   290 mcg Oral QAC breakfast   pantoprazole   40 mg Oral BID AC   polyethylene glycol  17 g Oral TID   simethicone   80 mg Oral TID AC   sucralfate   1 g Oral TID WC & HS   Nutritional status Signs/Symptoms: per patient/family report Interventions: Ensure Enlive (each supplement provides 350kcal and 20 grams of protein), Magic cup Body mass index is 29.23 kg/m.  Data Reviewed:   CBC: Recent Labs  Lab 12/14/24 0535 12/15/24 0537 12/16/24 0440  WBC 7.5 7.6 7.4  NEUTROABS 4.4  --   --   HGB 12.7 12.0 11.8*  HCT 37.7 35.0* 35.6*  MCV 99.5 98.9 98.9  PLT 248 237 257   Basic Metabolic Panel: Recent Labs  Lab 12/16/24 0440 12/17/24 0518 12/18/24 0511 12/19/24 0402 12/20/24 0538  NA 139 140 139 138 139  K 3.9 4.3 3.9 3.8 3.7  CL 103 106 102 102 104  CO2 30 29 24 24 23   GLUCOSE 92 84 109* 95 91  BUN 7 6 <5* 6 7  CREATININE 0.82 0.73 0.83 0.86 0.83  CALCIUM 8.8* 8.6* 9.1 8.8* 8.5*   GFR: Estimated Creatinine Clearance: 90.8 mL/min (by C-G formula based on SCr of 0.83 mg/dL).  Liver Function Tests: Recent Labs  Lab 12/16/24 0440 12/17/24 0518 12/18/24 0511 12/19/24 0402 12/20/24 0538  AST 62* 134* 73* 53* 39  ALT 138* 171* 156* 122* 96*  ALKPHOS 178* 168* 180* 163* 152*  BILITOT 0.3 0.3 0.3 0.3 0.3  PROT 6.1* 5.8* 6.4* 5.9* 5.7*  ALBUMIN 3.8 3.7 4.0 3.8 3.6   Coagulation Profile: No results for input(s): INR, PROTIME in the last 168 hours.   No results for input(s): PROCALCITON, LATICACIDVEN in the last 168 hours.   Recent Results (from the past 240 hours)  Culture, blood (Routine x 2)     Status: None   Collection Time: 12/12/24  7:47 PM   Specimen: BLOOD  Result Value Ref Range Status   Specimen Description BLOOD BLOOD LEFT ARM  Final   Special Requests   Final    BOTTLES DRAWN AEROBIC AND ANAEROBIC Blood Culture adequate volume   Culture   Final    NO GROWTH 5 DAYS Performed at [facility], [address] [place], [place] [address]    Report Status 12/17/2024 FINAL  Final  Culture, blood (Routine x 2)     Status: None   Collection Time: 12/12/24  7:56 PM   Specimen: BLOOD RIGHT HAND  Result Value Ref Range Status   Specimen Description BLOOD RIGHT HAND  Final   Special Requests   Final    BOTTLES DRAWN AEROBIC AND ANAEROBIC Blood Culture adequate volume   Culture   Final    NO GROWTH 5 DAYS Performed at [facility], [address], [place], [place] [address]    Report Status 12/17/2024 FINAL  Final     Radiology Studies: No results found.    LOS: 8 days   Time spent= 35 mins    Eric Nunnery, MD [facility]  If 7PM-7AM, please contact night-coverage  12/20/2024, 5:25  PM  "

## 2024-12-20 NOTE — Anesthesia Postprocedure Evaluation (Signed)
"   Anesthesia Post Note  Patient: Sandra Glover  Procedure(s) Performed: EGD (ESOPHAGOGASTRODUODENOSCOPY)  Patient location during evaluation: Phase II Anesthesia Type: MAC Level of consciousness: awake Pain management: pain level controlled Vital Signs Assessment: post-procedure vital signs reviewed and stable Respiratory status: spontaneous breathing and respiratory function stable Cardiovascular status: blood pressure returned to baseline and stable Postop Assessment: no headache and no apparent nausea or vomiting Anesthetic complications: no Comments: Late entry   No notable events documented.   Last Vitals:  Vitals:   12/19/24 2035 12/20/24 0356  BP: 120/75 (!) 94/59  Pulse: 76 76  Resp: 18 18  Temp: 36.4 C 36.6 C  SpO2: 93% 93%    Last Pain:  Vitals:   12/20/24 0356  TempSrc: Oral  PainSc:                  Yvonna PARAS Almetta Liddicoat      "

## 2024-12-20 NOTE — Telephone Encounter (Signed)
 Jhonny- Please arrange for hospital follow up with Dr. Cindie or myself in 3-4 weeks.   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

## 2024-12-21 DIAGNOSIS — R1013 Epigastric pain: Secondary | ICD-10-CM | POA: Diagnosis not present

## 2024-12-21 DIAGNOSIS — R7401 Elevation of levels of liver transaminase levels: Secondary | ICD-10-CM | POA: Diagnosis not present

## 2024-12-21 DIAGNOSIS — K5904 Chronic idiopathic constipation: Secondary | ICD-10-CM | POA: Diagnosis not present

## 2024-12-21 DIAGNOSIS — F909 Attention-deficit hyperactivity disorder, unspecified type: Secondary | ICD-10-CM

## 2024-12-21 DIAGNOSIS — F418 Other specified anxiety disorders: Secondary | ICD-10-CM

## 2024-12-21 LAB — COMPREHENSIVE METABOLIC PANEL WITH GFR
ALT: 82 U/L — ABNORMAL HIGH (ref 0–44)
AST: 32 U/L (ref 15–41)
Albumin: 3.9 g/dL (ref 3.5–5.0)
Alkaline Phosphatase: 160 U/L — ABNORMAL HIGH (ref 38–126)
Anion gap: 12 (ref 5–15)
BUN: 8 mg/dL (ref 6–20)
CO2: 25 mmol/L (ref 22–32)
Calcium: 9.1 mg/dL (ref 8.9–10.3)
Chloride: 104 mmol/L (ref 98–111)
Creatinine, Ser: 0.82 mg/dL (ref 0.44–1.00)
GFR, Estimated: >60 mL/min
Glucose, Bld: 85 mg/dL (ref 70–99)
Potassium: 4.2 mmol/L (ref 3.5–5.1)
Sodium: 141 mmol/L (ref 135–145)
Total Bilirubin: 0.2 mg/dL (ref 0.0–1.2)
Total Protein: 6 g/dL — ABNORMAL LOW (ref 6.5–8.1)

## 2024-12-21 MED ORDER — SUCRALFATE 1 GM/10ML PO SUSP: 1.0000 g | Freq: Three times a day (TID) | ORAL | 0 refills | Status: AC

## 2024-12-21 MED ORDER — METHOCARBAMOL 500 MG PO TABS: 500.0000 mg | ORAL_TABLET | Freq: Three times a day (TID) | ORAL | 0 refills | Status: AC | PRN

## 2024-12-21 MED ORDER — SIMETHICONE 80 MG PO CHEW: 80.0000 mg | CHEWABLE_TABLET | Freq: Three times a day (TID) | ORAL | 0 refills | Status: AC

## 2024-12-21 MED ORDER — GABAPENTIN 100 MG PO CAPS: 100.0000 mg | ORAL_CAPSULE | Freq: Two times a day (BID) | ORAL | Status: AC

## 2024-12-21 MED ORDER — DOCUSATE SODIUM 100 MG PO CAPS: 100.0000 mg | ORAL_CAPSULE | Freq: Every day | ORAL | 1 refills | Status: AC

## 2024-12-21 MED ORDER — HYOSCYAMINE SULFATE 0.125 MG SL SUBL: 0.1250 mg | SUBLINGUAL_TABLET | Freq: Four times a day (QID) | SUBLINGUAL | 0 refills | Status: AC | PRN

## 2024-12-21 MED ORDER — OXYCODONE HCL 5 MG PO TABS: 5.0000 mg | ORAL_TABLET | Freq: Four times a day (QID) | ORAL | 0 refills | Status: AC | PRN

## 2024-12-21 MED ORDER — QUETIAPINE FUMARATE 100 MG PO TABS: 100.0000 mg | ORAL_TABLET | Freq: Every day | ORAL | Status: DC

## 2024-12-21 NOTE — Discharge Summary (Signed)
 " Physician Discharge Summary   Patient: Sandra Glover MRN: [id number] DOB: Mar 07, 1986  Admit date:     12/12/2024  Discharge date: 12/21/24  Discharge Physician: Eric Nunnery   PCP: [name], FNP   Recommendations at discharge:  Repeat CBC to follow hemoglobin trend/stability Repeat complete metabolic panel to follow electrolytes, LFTs and renal function. Reassess blood pressure and adjust antihypertensive regimen as needed. Continue assisting patient with weight loss management.  Discharge Diagnoses: Principal Problem:   Transaminasemia Active Problems:   Depression with anxiety   Constipation   ADHD (attention deficit hyperactivity disorder)   GERD (gastroesophageal reflux disease)   Abdominal pain, epigastric   RUQ pain  Brief narrative: 39 year old female with history of HTN, anxiety, IBS, GERD, chronic abdominal pain presents to the ED with hematuria and right lower abdominal pain.  Workup showed transaminitis, CT abdomen pelvis was unremarkable.  She was started on IV Rocephin  due to concerns of urinary tract infection.  GI team was consulted.  MRCP was unremarkable.  EGD on 1/12 showed normal esophagus, small hiatal hernia and antral healing erosion/ulcer.   Assessment and Plan: 1-acute on chronic abdominal pain/transaminitis - So far tolerating full liquid diet - GI service on board and will follow recommendation. - Continue twice a day PPI - Liver biopsy results demonstrating mild nonspecific changes including minimal asteatosis, sinusoidal dilatation, and a scattered portal and lobular inflammation.  There is no pathologic fibrosis.  No evidence of autoimmune hepatitis. - Continue to follow biopsy results from endoscopic evaluation. -Continue using Linzess . - Patient advised to continue maintaining adequate oral hydration. - LFTs continue trending down. - Will minimize the use of opiates; continue simethicone  and the use of Levsin .  Continue advancing  diet.     2-hematuria - Resolved - There was initial concern for possible UTI, patient empirically was covered with Rocephin ; urinalysis/culture demonstrating no growth and antibiotic has been discontinued. - Patient advised to maintain adequate hydration.   3-left neck pain - CT scan demonstrating no acute abnormality - Continue analgesic and muscle relaxant.   4-essential hypertension - Blood pressure remained stable/soft without the use of antihypertensive agents - Will continue holding them at discharge; especially given the use of diuretics when patient is currently having some difficulties with nutrition and hydration to minimize the chances of dehydration. - Follow-up with PCP to reassess vital signs and further adjust medications as required.   5-GERD - As mentioned above continue PPI and also Carafate  as recommended by GI service.   6-constipation - Continue Colace, MiraLAX  and Linzess .   7-history of ADHD - Continue Adderall.   8-anxiety/bipolar disorder - Continue Prozac  and as needed Xanax . - Overall stable mood appreciated.  9-overweight - Low-calorie diet and increase physical activity discussed with patient -Body mass index is 29.23 kg/m.    Consultants: GI service Procedures performed: See below for x-ray report. Disposition: Home Diet recommendation: Heart healthy/low-sodium diet.  DISCHARGE MEDICATION: Allergies as of 12/21/2024       Reactions   Hydrocodone -acetaminophen  Nausea Only   Lortab [hydrocodone -acetaminophen ] Nausea And Vomiting   Morphine And Codeine Rash        Medication List     STOP taking these medications    HYDROcodone  bit-homatropine 5-1.5 MG/5ML syrup Commonly known as: HYCODAN   lisinopril -hydrochlorothiazide  20-12.5 MG tablet Commonly known as: ZESTORETIC    lubiprostone  8 MCG capsule Commonly known as: AMITIZA    meloxicam 7.5 MG tablet Commonly known as: MOBIC   metoprolol  tartrate 100 MG tablet Commonly  known as: Lopressor    ondansetron  4 MG tablet Commonly known as: ZOFRAN    promethazine -dextromethorphan 6.25-15 MG/5ML syrup Commonly known as: PROMETHAZINE -DM       TAKE these medications    ALPRAZolam  1 MG tablet Commonly known as: Xanax  Take 1 tablet (1 mg total) by mouth 2 (two) times daily as needed for anxiety.   amphetamine -dextroamphetamine  20 MG tablet Commonly known as: Adderall Take 1 tablet (20 mg total) by mouth 2 (two) times daily.   benzonatate  100 MG capsule Commonly known as: TESSALON  Take 1 capsule (100 mg total) by mouth 3 (three) times daily as needed for cough. Do not take with alcohol or while operating or driving heavy machinery   calcium-vitamin D  500-200 MG-UNIT tablet Commonly known as: OSCAL WITH D Take 1 tablet by mouth.   docusate sodium  100 MG capsule Commonly known as: COLACE Take 1 capsule (100 mg total) by mouth daily. Start taking on: December 22, 2024   Drospirenone -Estetrol  3-14.2 MG Tabs Take 1 tablet by mouth daily.   etonogestrel  68 MG Impl implant Commonly known as: NEXPLANON  1 each by Subdermal route once.   FLUoxetine  40 MG capsule Commonly known as: PROZAC  Take 1 capsule (40 mg total) by mouth daily.   gabapentin  100 MG capsule Commonly known as: NEURONTIN  Take 1 capsule (100 mg total) by mouth 2 (two) times daily.   hyoscyamine  0.125 MG SL tablet Commonly known as: LEVSIN  SL Take 1 tablet (0.125 mg total) by mouth every 6 (six) hours as needed for cramping (abdominal pain).   linaclotide  290 MCG Caps capsule Commonly known as: Linzess  Take 1 capsule (290 mcg total) by mouth daily before breakfast.   methocarbamol  500 MG tablet Commonly known as: ROBAXIN  Take 1 tablet (500 mg total) by mouth every 8 (eight) hours as needed for muscle spasms (pain). What changed: when to take this   oxyCODONE  5 MG immediate release tablet Commonly known as: Roxicodone  Take 1 tablet (5 mg total) by mouth every 6 (six) hours as  needed for up to 20 doses for severe pain (pain score 7-10).   pantoprazole  40 MG tablet Commonly known as: PROTONIX  TAKE 1 TABLET(40 MG) BY MOUTH TWICE DAILY   polyethylene glycol powder 17 GM/SCOOP powder Commonly known as: GLYCOLAX /MIRALAX  1-4 scoop daily or as needed   QUEtiapine  100 MG tablet Commonly known as: SEROQUEL  Take 1 tablet (100 mg total) by mouth at bedtime. What changed: when to take this   simethicone  80 MG chewable tablet Commonly known as: MYLICON Chew 1 tablet (80 mg total) by mouth 3 (three) times daily before meals.   sucralfate  1 GM/10ML suspension Commonly known as: CARAFATE  Take 10 mLs (1 g total) by mouth 4 (four) times daily -  with meals and at bedtime.   Vitamin D  (Ergocalciferol ) 1.25 MG (50000 UNIT) Caps capsule Commonly known as: DRISDOL  Take 1 capsule (50,000 Units total) by mouth every 7 (seven) days.        Follow-up Information     Bacchus, Meade PEDLAR, FNP. Schedule an appointment as soon as possible for a visit in 10 day(s).   Specialty: Family Medicine Contact information: [address] [place] [address] [place] [address] [phone]                Discharge Exam: Fredricka Weights   12/12/24 1923 12/16/24 1049  Weight: 74.8 kg 74.8 kg    General exam: Alert, awake, oriented x 3; reports no vomiting.  Still with ongoing abdominal pain. Respiratory system: Good saturation  on room air. Cardiovascular system:RRR. No murmurs, rubs, gallops. Gastrointestinal system: Abdomen is nondistended, soft and without guarding.  Positive bowel sounds appreciated. Central nervous system: Alert and oriented. No focal neurological deficits. Extremities: No C/C/E, +pedal pulses Skin: No rashes, lesions or ulcers Psychiatry: Judgement and insight appear normal. Mood & affect appropriate.   Condition at discharge: Stable and improved.  The results of significant diagnostics from this hospitalization (including imaging, microbiology, ancillary and  laboratory) are listed below for reference.   Imaging Studies: CT SOFT TISSUE NECK W CONTRAST Result Date: 12/17/2024 EXAM: CT NECK WITH CONTRAST 12/17/2024 04:30:10 PM TECHNIQUE: CT of the neck was performed with the administration of intravenous contrast. 75 mL of Omnipaque  was administered. Multiplanar reformatted images are provided for review. Automated exposure control, iterative reconstruction, and/or weight based adjustment of the mA/kV was utilized to reduce the radiation dose to as low as reasonably achievable. COMPARISON: CT cervical spine 12/14/2022. CLINICAL HISTORY: 39 year old female.  New onset left side neck pain. Soft tissue infection suspected, neck. Abnormal liver enzymes, recent ultrasound guided liver biopsy. FINDINGS: AERODIGESTIVE TRACT: At the larynx, the glottis is closed. No discrete mass. No edema. SALIVARY GLANDS: The parotid and submandibular glands are unremarkable. THYROID : Unremarkable. LYMPH NODES: Bilateral cervical lymph nodes appear stable since 2024 and normal. SOFT TISSUES: No mass or fluid collection. 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 VASCULATURE: Major vascular structures in the bilateral neck and at the skull base are enhancing and patent. OTHER: Visualized sinuses and mastoid air cells are well aerated. Visualized lungs are clear. IMPRESSION: 1. No acute or inflammatory process identified in the neck. Electronically signed by: Helayne Hurst MD 12/17/2024 05:46 PM EST RP Workstation: [id number]   DG Abd 1 View Result Date: 12/15/2024 EXAM: 1 VIEW XRAY OF THE ABDOMEN 12/15/2024 10:11:00 AM COMPARISON: CT abdomen and pelvis 12/12/2024. CLINICAL HISTORY: 39 year old female with abdominal pain. FINDINGS: BOWEL: Volume of bowel gas has increased from the recent CT, but the pattern is nonobstructed. SOFT TISSUES: Surgical clips in right upper quadrant. No abnormal  calcifications. BONES: No acute fracture. IMPRESSION: 1. Non-obstructed bowel gas pattern.  No acute radiographic finding. Electronically signed by: Helayne Hurst MD MD 12/15/2024 10:34 AM EST RP Workstation: [id number]   US  BIOPSY (LIVER) Result Date: 12/13/2024 INDICATION: 39 year old female with history of elevated liver enzymes. EXAM: ULTRASOUND BIOPSY CORE LIVER MEDICATIONS: None. ANESTHESIA/SEDATION: Moderate (conscious) sedation was employed during this procedure. A total of Versed  3 mg and Fentanyl  100 mcg was administered intravenously. Moderate Sedation Time: 10 minutes. The patient's level of consciousness and vital signs were monitored continuously by radiology nursing throughout the procedure under my direct supervision. COMPLICATIONS: None immediate. PROCEDURE: Informed written consent was obtained from the patient after a thorough discussion of the procedural risks, benefits and alternatives. All questions were addressed. Maximal Sterile Barrier Technique was utilized including caps, mask, sterile gowns, sterile gloves, sterile drape, hand hygiene and skin antiseptic. A timeout was performed prior to the initiation of the procedure. Preprocedure ultrasound demonstrated safe window in the right upper quadrant for nonfocal liver biopsy. The right upper quadrant was prepped and draped in standard fashion. Local anesthesia was administered subdermally at the planned entry site as well as under ultrasound guidance along the hepatic capsule. A skin nick was made. A 17 gauge introducer needle was advanced to the hepatic parenchyma under ultrasound guidance. Next, a total of 2, 18 gauge core biopsies were  obtained. The samples were placed in formalin and sent to Pathology. Under ultrasound guidance, a Gel-Foam slurry was administered along the needle entry tract as the introducer needle was withdrawn. Postprocedure ultrasound demonstrated no evidence of perihepatic fluid collection. The patient tolerated the  procedure well. IMPRESSION: Technically successful ultrasound-guided nonfocal core liver biopsy from the right lobe of the liver. Ester Sides, MD [facility] Electronically Signed   By: Ester Sides M.D.   On: 12/13/2024 13:22   US  LIVER DOPPLER Result Date: 12/13/2024 EXAM: RIGHT UPPER QUADRANT ULTRASOUND WITH DOPPLER EVALUATION 12/13/2024 09:32:12 AM TECHNIQUE: Grayscale and Doppler imaging was performed of the right upper quadrant. COMPARISON: 12/12/2024, 12/04/2024 CLINICAL HISTORY: 221910 Elevated LFTs 221910; [id number] Abdominal pain [id number] FINDINGS: LIVER: Normal echogenicity. No evidence of intrahepatic biliary ductal dilatation. BILIARY SYSTEM: Cholecystectomy. No intrahepatic or extrahepatic biliary ductal dilation OTHER: No right upper quadrant ascites. VASCULAR: Doppler flow in the normal direction of the hepatic vasculature. Normal hepatopetal flow of the hepatic artery and portal vein. Normal hepatofugal flow of the hepatic veins. Normal doppler waveforms are visualized. The portal vein is of normal size and not dilated. Spleen: At the upper limits of normal for size, measuring 12.1 cm in craniocaudal dimension. IMPRESSION: 1. Patient portal vein. Otherwise, the hepatic vasculature demonstrates normal vascular doppler flow. 2. The spleen is at the upper limits of normal for size, measuring 12.1 cm. Electronically signed by: Rogelia Myers MD MD 12/13/2024 10:20 AM EST RP Workstation: [id number]   US  Abdomen Limited Result Date: 12/13/2024 EXAM: Right Upper Quadrant Abdominal Ultrasound 12/13/2024 09:32:27 AM TECHNIQUE: Real-time ultrasonography of the right upper quadrant of the abdomen was performed. COMPARISON: 12/12/2024 CT of the abdomen and pelvis ;12/04/2024 MR of the abdomen CLINICAL HISTORY: Abdominal pain. FINDINGS: LIVER: Normal echogenicity. No intrahepatic biliary ductal dilatation. No evidence of mass. Hepatopetal flow in the  portal vein. BILIARY SYSTEM: Cholecystectomy. Common bile duct is within normal limits measuring 4.4 mm. OTHER: No right upper quadrant ascites. IMPRESSION: 1. Cholecystectomy. No intrahepatic or extrahepatic biliary ductal dilation. Electronically signed by: Rogelia Myers MD MD 12/13/2024 09:52 AM EST RP Workstation: [id number]   CT Renal Stone Study Result Date: 12/12/2024 EXAM: CT ABDOMEN AND PELVIS WITHOUT CONTRAST 12/12/2024 08:29:28 PM TECHNIQUE: CT of the abdomen and pelvis was performed without the administration of intravenous contrast. Multiplanar reformatted images are provided for review. Automated exposure control, iterative reconstruction, and/or weight-based adjustment of the mA/kV was utilized to reduce the radiation dose to as low as reasonably achievable. COMPARISON: CT abdomen and pelvis 11/22/2024. CLINICAL HISTORY: Abdominal/flank pain, stone suspected. FINDINGS: LOWER CHEST: No acute abnormality. LIVER: The liver is unremarkable. GALLBLADDER AND BILE DUCTS: Gallbladder is unremarkable. No biliary ductal dilatation. SPLEEN: No acute abnormality. PANCREAS: No acute abnormality. ADRENAL GLANDS: No acute abnormality. 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 There is stable scarring in the superior pole of the right kidney. No stones in the kidneys or ureters. No hydronephrosis. No perinephric or periureteral stranding. Urinary bladder is unremarkable. GI AND BOWEL: Stomach demonstrates no acute abnormality. The appendix is within normal limits. There is no bowel obstruction. PERITONEUM AND RETROPERITONEUM: No ascites. No free air. VASCULATURE: Aorta is normal in caliber. LYMPH NODES: No lymphadenopathy. REPRODUCTIVE ORGANS: No acute abnormality. BONES AND SOFT TISSUES: No acute osseous abnormality. No focal soft tissue abnormality. IMPRESSION: 1. No acute findings in the abdomen or pelvis. 2. Stable scarring in the superior pole of the right kidney. Electronically  signed by: Greig Pique  MD MD 12/12/2024 08:39 PM EST RP Workstation: [id number]   DG Chest 1 View Result Date: 12/10/2024 EXAM: 1 VIEW(S) XRAY OF THE CHEST 12/10/2024 07:55:00 PM COMPARISON: 11/22/2024 CLINICAL HISTORY: Cough FINDINGS: LUNGS AND PLEURA: No focal pulmonary opacity. No pleural effusion. No pneumothorax. HEART AND MEDIASTINUM: No acute abnormality of the cardiac and mediastinal silhouettes. BONES AND SOFT TISSUES: No acute osseous abnormality. IMPRESSION: 1. No acute process. Electronically signed by: Greig Pique MD 12/10/2024 08:03 PM EST RP Workstation: [id number]   MR ABDOMEN MRCP W WO CONTAST Result Date: 12/04/2024 CLINICAL DATA:  Abdominal pain, history of partial right nephrectomy and cholecystectomy EXAM: MRI ABDOMEN WITHOUT AND WITH CONTRAST (INCLUDING MRCP) TECHNIQUE: Multiplanar multisequence MR imaging of the abdomen was performed both before and after the administration of intravenous contrast. Heavily T2-weighted images of the biliary and pancreatic ducts were obtained, and three-dimensional MRCP images were rendered by post processing. CONTRAST:  7mL GADAVIST  GADOBUTROL  1 MMOL/ML IV SOLN COMPARISON:  11/22/2024 FINDINGS: Lower chest: No acute abnormality. Hepatobiliary: No solid liver abnormality is seen. Cholecystectomy. No biliary ductal dilatation. Pancreas: Unremarkable. No pancreatic ductal dilatation or surrounding inflammatory changes. Spleen: Normal in size without significant abnormality. Adrenals/Urinary Tract: Adrenal glands are unremarkable. 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 Kidneys are otherwise normal, without renal calculi, solid lesion, or hydronephrosis. Stomach/Bowel: Stomach is within normal limits. No evidence of bowel wall thickening, distention, or inflammatory changes. Vascular/Lymphatic: No significant vascular findings are present. No enlarged abdominal lymph nodes. Other: No abdominal wall hernia or abnormality. No  ascites. Musculoskeletal: No acute or significant osseous findings. IMPRESSION: 1. No acute MR findings of the abdomen to explain abdominal pain. 2. 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 3. Status post cholecystectomy.  No biliary ductal dilatation. Electronically Signed   By: Marolyn JONETTA Jaksch M.D.   On: 12/04/2024 13:53   MR 3D Recon At Scanner Result Date: 12/04/2024 CLINICAL DATA:  Abdominal pain, history of partial right nephrectomy and cholecystectomy EXAM: MRI ABDOMEN WITHOUT AND WITH CONTRAST (INCLUDING MRCP) TECHNIQUE: Multiplanar multisequence MR imaging of the abdomen was performed both before and after the administration of intravenous contrast. Heavily T2-weighted images of the biliary and pancreatic ducts were obtained, and three-dimensional MRCP images were rendered by post processing. CONTRAST:  7mL GADAVIST  GADOBUTROL  1 MMOL/ML IV SOLN COMPARISON:  11/22/2024 FINDINGS: Lower chest: No acute abnormality. Hepatobiliary: No solid liver abnormality is seen. Cholecystectomy. No biliary ductal dilatation. Pancreas: Unremarkable. No pancreatic ductal dilatation or surrounding inflammatory changes. Spleen: Normal in size without significant abnormality. Adrenals/Urinary Tract: Adrenal glands are unremarkable. 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 Kidneys are otherwise normal, without renal calculi, solid lesion, or hydronephrosis. Stomach/Bowel: Stomach is within normal limits. No evidence of bowel wall thickening, distention, or inflammatory changes. Vascular/Lymphatic: No significant vascular findings are present. No enlarged abdominal lymph nodes. Other: No abdominal wall hernia or abnormality. No ascites. Musculoskeletal: No acute or significant osseous findings. IMPRESSION: 1. No acute MR findings of the abdomen to explain abdominal pain. 2. 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 3. Status post  cholecystectomy.  No biliary ductal dilatation. Electronically Signed   By: Marolyn JONETTA Jaksch M.D.   On: 12/04/2024 13:53   CT ABDOMEN PELVIS W CONTRAST Result Date: 11/22/2024 CLINICAL DATA:  Diffuse abdominal pain and rectal bleeding. EXAM: CT ABDOMEN AND PELVIS WITH CONTRAST TECHNIQUE: Multidetector CT imaging of the abdomen and pelvis was performed using the  standard protocol following bolus administration of intravenous contrast. RADIATION DOSE REDUCTION: This exam was performed according to the departmental dose-optimization program which includes automated exposure control, adjustment of the mA and/or kV according to patient size and/or use of iterative reconstruction technique. CONTRAST:  OMNIPAQUE  IOHEXOL  300 MG/ML  SOLN COMPARISON:  July 05, 2023 FINDINGS: Lower chest: No acute abnormality. Hepatobiliary: No focal liver abnormality is seen. Status post cholecystectomy. No biliary dilatation. Pancreas: Unremarkable. No pancreatic ductal dilatation or surrounding inflammatory changes. Spleen: Normal in size without focal abnormality. Adrenals/Urinary Tract: Adrenal glands are unremarkable. Kidneys are normal, without renal calculi, focal lesion, or hydronephrosis. Bladder is unremarkable. Stomach/Bowel: Stomach is within normal limits. Appendix appears normal. No evidence of bowel wall thickening, distention, or inflammatory changes. Vascular/Lymphatic: No significant vascular findings are present. No enlarged abdominal or pelvic lymph nodes. Reproductive: Uterus and bilateral adnexa are unremarkable. Other: No abdominal wall hernia or abnormality. No abdominopelvic ascites. Musculoskeletal: No acute or significant osseous findings. IMPRESSION: 1. No acute or active process within the abdomen or pelvis. 2. Evidence of prior cholecystectomy. Electronically Signed   By: Suzen Dials M.D.   On: 11/22/2024 19:23   DG Chest Portable 1 View Result Date: 11/22/2024 EXAM: 1 VIEW(S) XRAY OF THE CHEST  11/22/2024 05:05:00 PM COMPARISON: 06/24/2024 CLINICAL HISTORY: chest pain FINDINGS: LUNGS AND PLEURA: No focal pulmonary opacity. No pleural effusion. No pneumothorax. HEART AND MEDIASTINUM: No acute abnormality of the cardiac and mediastinal silhouettes. BONES AND SOFT TISSUES: No acute osseous abnormality. IMPRESSION: 1. No acute cardiopulmonary abnormality. Electronically signed by: Greig Pique MD 11/22/2024 05:46 PM EST RP Workstation: [id number]    Microbiology: Results for orders placed or performed during the hospital encounter of 12/12/24  Culture, blood (Routine x 2)     Status: None   Collection Time: 12/12/24  7:47 PM   Specimen: BLOOD  Result Value Ref Range Status   Specimen Description BLOOD BLOOD LEFT ARM  Final   Special Requests   Final    BOTTLES DRAWN AEROBIC AND ANAEROBIC Blood Culture adequate volume   Culture   Final    NO GROWTH 5 DAYS Performed at [facility], [address], [place], [place] [address]    Report Status 12/17/2024 FINAL  Final  Culture, blood (Routine x 2)     Status: None   Collection Time: 12/12/24  7:56 PM   Specimen: BLOOD RIGHT HAND  Result Value Ref Range Status   Specimen Description BLOOD RIGHT HAND  Final   Special Requests   Final    BOTTLES DRAWN AEROBIC AND ANAEROBIC Blood Culture adequate volume   Culture   Final    NO GROWTH 5 DAYS Performed at [facility], [address], [place], [place] [address]    Report Status 12/17/2024 FINAL  Final    Labs: CBC: Recent Labs  Lab 12/15/24 0537 12/16/24 0440  WBC 7.6 7.4  HGB 12.0 11.8*  HCT 35.0* 35.6*  MCV 98.9 98.9  PLT 237 257   Basic Metabolic Panel: Recent Labs  Lab 12/17/24 0518 12/18/24 0511 12/19/24 0402 12/20/24 0538 12/21/24 0411  NA 140 139 138 139 141  K 4.3 3.9 3.8 3.7 4.2  CL 106 102 102 104 104  CO2 29 24 24 23 25   GLUCOSE 84 109* 95 91 85  BUN 6 <5* 6 7 8   CREATININE 0.73 0.83 0.86 0.83 0.82  CALCIUM 8.6* 9.1 8.8* 8.5* 9.1   Liver Function  Tests: Recent Labs  Lab 12/17/24 0518 12/18/24  9488 12/19/24 0402 12/20/24 0538 12/21/24 0411  AST 134* 73* 53* 39 32  ALT 171* 156* 122* 96* 82*  ALKPHOS 168* 180* 163* 152* 160*  BILITOT 0.3 0.3 0.3 0.3 0.2  PROT 5.8* 6.4* 5.9* 5.7* 6.0*  ALBUMIN 3.7 4.0 3.8 3.6 3.9   CBG: No results for input(s): GLUCAP in the last 168 hours.  Discharge time spent:  35 minutes.  Signed: Eric Nunnery, MD [facility] 12/21/2024 "

## 2024-12-23 ENCOUNTER — Ambulatory Visit: Payer: Self-pay | Admitting: Internal Medicine

## 2024-12-23 ENCOUNTER — Telehealth: Payer: Self-pay

## 2024-12-23 NOTE — Transitions of Care (Post Inpatient/ED Visit) (Signed)
 "  12/23/2024  Name: Sandra Glover MRN: [id number] DOB: 1986/08/19  Today's TOC FU Call Status: Today's TOC FU Call Status:: Successful TOC FU Call Completed TOC FU Call Complete Date: 12/23/24  Patient's Name and Date of Birth confirmed. Name, DOB  Transition Care Management Follow-up Telephone Call Date of Discharge: 12/21/24 [facility] Type of Discharge: Inpatient Admission Primary Inpatient Discharge Diagnosis:: elevated liver How have you been since you were released from the hospital?: Better Any questions or concerns?: No  Items Reviewed: Did you receive and understand the discharge instructions provided?: Yes Medications obtained,verified, and reconciled?: Yes (Medications Reviewed) Any new allergies since your discharge?: No Dietary orders reviewed?: Yes Do you have support at home?: Yes People in Home [RPT]: spouse  Medications Reviewed Today: Medications Reviewed Today     Reviewed by Emmitt Pan, LPN (Licensed Practical Nurse) on 12/23/24 at 1146  Med List Status: <None>   Medication Order Taking? Sig Documenting Provider Last Dose Status Informant  ALPRAZolam  (XANAX ) 1 MG tablet [id number] Yes Take 1 tablet (1 mg total) by mouth 2 (two) times daily as needed for anxiety. Okey Barnie SAUNDERS, MD  Active Self, Pharmacy Records  amphetamine -dextroamphetamine  (ADDERALL) 20 MG tablet [id number] Yes Take 1 tablet (20 mg total) by mouth 2 (two) times daily. Okey Barnie SAUNDERS, MD  Active Self, Pharmacy Records  benzonatate  (TESSALON ) 100 MG capsule [id number]  Take 1 capsule (100 mg total) by mouth 3 (three) times daily as needed for cough. Do not take with alcohol or while operating or driving heavy machinery Chandra Harlene LABOR, NP  Active Self, Pharmacy Records           Med Note RANDENE INOCENTE BROCKS   Dju Dec 14, 2024  5:56 PM) No longer taking  calcium-vitamin D  (OSCAL WITH D) 500-200 MG-UNIT tablet [id number] Yes Take 1 tablet by mouth. [provider]  Active Self, Pharmacy Records  docusate sodium  (COLACE) 100 MG capsule [id number] Yes Take 1 capsule (100 mg total) by mouth daily. Ricky Fines, MD  Active   Drospirenone -Estetrol  3-14.2 MG TABS [id number] Yes Take 1 tablet by mouth daily. Jayne Vonn VEAR, MD  Active Self, Pharmacy Records           Med Note RANDENE INOCENTE BROCKS   Dju Dec 14, 2024  5:57 PM) No longer taking  etonogestrel  (NEXPLANON ) 68 MG IMPL implant [id number] Yes 1 each by Subdermal route once. [provider]  Active Self, Pharmacy Records  FLUoxetine  (PROZAC ) 40 MG capsule [id number] Yes Take 1 capsule (40 mg total) by mouth daily. Okey Barnie SAUNDERS, MD  Active Self, Pharmacy Records  gabapentin  (NEURONTIN ) 100 MG capsule [id number] Yes Take 1 capsule (100 mg total) by mouth 2 (two) times daily. Ricky Fines, MD  Active   hyoscyamine  (LEVSIN  SL) 0.125 MG SL tablet [id number] Yes Take 1 tablet (0.125 mg total) by mouth every 6 (six) hours as needed for cramping (abdominal pain). Ricky Fines, MD  Active   linaclotide  (LINZESS ) 290 MCG CAPS capsule [id number] Yes Take 1 capsule (290 mcg total) by mouth daily before breakfast. Kennedy Charmaine CROME, NP  Active Self, Pharmacy Records  methocarbamol  (ROBAXIN ) 500 MG tablet [id number] Yes Take 1 tablet (500 mg total) by mouth every 8 (eight) hours as needed for muscle spasms (pain). Ricky Fines, MD  Active   oxyCODONE  (ROXICODONE ) 5 MG immediate release tablet [id number] Yes Take 1 tablet (5 mg total) by mouth every 6 (six) hours as needed for up to  20 doses for severe pain (pain score 7-10). Ricky Fines, MD  Active   pantoprazole  (PROTONIX ) 40 MG tablet [id number] Yes TAKE 1 TABLET(40 MG) BY MOUTH TWICE DAILY Bacchus, Gloria Z, FNP  Active Self, Pharmacy Records  polyethylene glycol powder (GLYCOLAX /MIRALAX ) 17 GM/SCOOP powder [id number] Yes 1-4 scoop daily or as needed Jayne Vonn DEL, MD  Active Self, Pharmacy Records  QUEtiapine  (SEROQUEL ) 100 MG tablet [id number] Yes  Take 1 tablet (100 mg total) by mouth at bedtime. Ricky Fines, MD  Active   simethicone  [facility]) 80 MG chewable tablet [id number] Yes Chew 1 tablet (80 mg total) by mouth 3 (three) times daily before meals. Ricky Fines, MD  Active   sucralfate  (CARAFATE ) 1 GM/10ML suspension [id number] Yes Take 10 mLs (1 g total) by mouth 4 (four) times daily -  with meals and at bedtime. Ricky Fines, MD  Active   Vitamin D , Ergocalciferol , (DRISDOL ) 1.25 MG (50000 UNIT) CAPS capsule [id number] Yes Take 1 capsule (50,000 Units total) by mouth every 7 (seven) days. Bacchus, Meade PEDLAR, FNP  Active Self, Pharmacy Records            Home Care and Equipment/Supplies: Were Home Health Services Ordered?: NA Any new equipment or medical supplies ordered?: NA  Functional Questionnaire: Do you need assistance with bathing/showering or dressing?: No Do you need assistance with meal preparation?: No Do you need assistance with eating?: No Do you have difficulty maintaining continence: No Do you need assistance with getting out of bed/getting out of a chair/moving?: No Do you have difficulty managing or taking your medications?: No  Follow up appointments reviewed: PCP Follow-up appointment confirmed?: Yes Date of PCP follow-up appointment?: 12/26/24 Follow-up Provider: [facility] Follow-up appointment confirmed?: No Reason Specialist Follow-Up Not Confirmed: Patient has Specialist Provider Number and will Call for Appointment Do you need transportation to your follow-up appointment?: No Do you understand care options if your condition(s) worsen?: Yes-patient verbalized understanding    SIGNATURE Julian Lemmings, LPN [facility] Nurse Health Advisor Direct Dial [phone]  "

## 2024-12-24 ENCOUNTER — Ambulatory Visit: Admitting: Gastroenterology

## 2024-12-25 NOTE — Telephone Encounter (Signed)
 Referral sent to [facility] and they will contact patient

## 2024-12-26 ENCOUNTER — Telehealth: Admitting: Family Medicine

## 2024-12-26 ENCOUNTER — Encounter: Payer: Self-pay | Admitting: Gastroenterology

## 2024-12-26 ENCOUNTER — Encounter: Payer: Self-pay | Admitting: Family Medicine

## 2024-12-26 DIAGNOSIS — I10 Essential (primary) hypertension: Secondary | ICD-10-CM | POA: Diagnosis not present

## 2024-12-26 NOTE — Progress Notes (Signed)
 "  Virtual Visit via Video Note  I connected with Sandra Glover on 12/26/24 at  9:00 AM EST by a video enabled telemedicine application and verified that I am speaking with the correct person using two identifiers.  Patient Location: Home Provider Location: Home Office  I discussed the limitations, risks, security, and privacy concerns of performing an evaluation and management service by video and the availability of in person appointments. I also discussed with the patient that there may be a patient responsible charge related to this service. The patient expressed understanding and agreed to proceed.  Subjective: PCP: Sandra Meade PEDLAR, FNP  Chief Complaint  Patient presents with   Follow-up    5 month f/u    HPI The patient presents today for management of chronic conditions and reports no complaints or concerns at this time. She reports adherence to her treatment regimen with no side effects or adverse effects from her medications.   ROS: Per HPI Current Medications[1]  Observations/Objective: There were no vitals filed for this visit. Physical Exam Patient is well-developed, well-nourished in no acute distress.  Resting comfortably at home.  Head is normocephalic, atraumatic.  No labored breathing.  Speech is clear and coherent with logical content.  Patient is alert and oriented at baseline.   Assessment and Plan: Primary hypertension Assessment & Plan: Controlled Blood Pressure in Clinic The patients blood pressure is controlled in the clinic today. Encouraged to continue treatment regimen The patient remains asymptomatic. Advised to maintain a low-sodium diet and increase physical activity as tolerated.     Encouraged the patient to continue her/his treatment regimen as prescribed. A heart-healthy diet was encouraged, along with increased physical activity as tolerated.   Follow Up Instructions: No follow-ups on file.   I discussed the assessment and  treatment plan with the patient. The patient was provided an opportunity to ask questions, and all were answered. The patient agreed with the plan and demonstrated an understanding of the instructions.   The patient was advised to call back or seek an in-person evaluation if the symptoms worsen or if the condition fails to improve as anticipated.  The above assessment and management plan was discussed with the patient. The patient verbalized understanding of and has agreed to the management plan.   Meade Glover Edman, FNP     [1]  Current Outpatient Medications:    ALPRAZolam  (XANAX ) 1 MG tablet, Take 1 tablet (1 mg total) by mouth 2 (two) times daily as needed for anxiety., Disp: 60 tablet, Rfl: 2   amphetamine -dextroamphetamine  (ADDERALL) 20 MG tablet, Take 1 tablet (20 mg total) by mouth 2 (two) times daily., Disp: 60 tablet, Rfl: 0   calcium-vitamin D  (OSCAL WITH D) 500-200 MG-UNIT tablet, Take 1 tablet by mouth., Disp: , Rfl:    docusate sodium  (COLACE) 100 MG capsule, Take 1 capsule (100 mg total) by mouth daily., Disp: 30 capsule, Rfl: 1   Drospirenone -Estetrol  3-14.2 MG TABS, Take 1 tablet by mouth daily., Disp: 28 tablet, Rfl: 12   etonogestrel  (NEXPLANON ) 68 MG IMPL implant, 1 each by Subdermal route once., Disp: , Rfl:    FLUoxetine  (PROZAC ) 40 MG capsule, Take 1 capsule (40 mg total) by mouth daily., Disp: 30 capsule, Rfl: 2   gabapentin  (NEURONTIN ) 100 MG capsule, Take 1 capsule (100 mg total) by mouth 2 (two) times daily., Disp: , Rfl:    hyoscyamine  (LEVSIN  SL) 0.125 MG SL tablet, Take 1 tablet (0.125 mg total) by mouth every 6 (six)  hours as needed for cramping (abdominal pain)., Disp: 45 tablet, Rfl: 0   linaclotide  (LINZESS ) 290 MCG CAPS capsule, Take 1 capsule (290 mcg total) by mouth daily before breakfast., Disp: 30 capsule, Rfl: 5   methocarbamol  (ROBAXIN ) 500 MG tablet, Take 1 tablet (500 mg total) by mouth every 8 (eight) hours as needed for muscle spasms (pain)., Disp:  30 tablet, Rfl: 0   oxyCODONE  (ROXICODONE ) 5 MG immediate release tablet, Take 1 tablet (5 mg total) by mouth every 6 (six) hours as needed for up to 20 doses for severe pain (pain score 7-10)., Disp: 20 tablet, Rfl: 0   pantoprazole  (PROTONIX ) 40 MG tablet, TAKE 1 TABLET(40 MG) BY MOUTH TWICE DAILY, Disp: 90 tablet, Rfl: 3   polyethylene glycol powder (GLYCOLAX /MIRALAX ) 17 GM/SCOOP powder, 1-4 scoop daily or as needed, Disp: 255 g, Rfl: 11   QUEtiapine  (SEROQUEL ) 100 MG tablet, Take 1 tablet (100 mg total) by mouth at bedtime., Disp: , Rfl:    simethicone  (MYLICON) 80 MG chewable tablet, Chew 1 tablet (80 mg total) by mouth 3 (three) times daily before meals., Disp: 45 tablet, Rfl: 0   sucralfate  (CARAFATE ) 1 GM/10ML suspension, Take 10 mLs (1 g total) by mouth 4 (four) times daily -  with meals and at bedtime., Disp: 420 mL, Rfl: 0   Vitamin D , Ergocalciferol , (DRISDOL ) 1.25 MG (50000 UNIT) CAPS capsule, Take 1 capsule (50,000 Units total) by mouth every 7 (seven) days., Disp: 20 capsule, Rfl: 1  "

## 2024-12-26 NOTE — Assessment & Plan Note (Signed)
 Controlled Blood Pressure in Clinic The patients blood pressure is controlled in the clinic today. Encouraged to continue treatment regimen The patient remains asymptomatic. Advised to maintain a low-sodium diet and increase physical activity as tolerated.

## 2024-12-27 ENCOUNTER — Telehealth: Payer: Self-pay | Admitting: Gastroenterology

## 2024-12-27 ENCOUNTER — Encounter: Payer: Self-pay | Admitting: Gastroenterology

## 2024-12-27 NOTE — Telephone Encounter (Signed)
 Trulance  appeal letter written and placed on your desk.  Charmaine Melia, MSN, APRN, FNP-BC, AGACNP-BC [facility]

## 2024-12-31 ENCOUNTER — Encounter (HOSPITAL_COMMUNITY): Payer: Self-pay | Admitting: Psychiatry

## 2024-12-31 ENCOUNTER — Telehealth (HOSPITAL_COMMUNITY): Admitting: Psychiatry

## 2024-12-31 DIAGNOSIS — F4310 Post-traumatic stress disorder, unspecified: Secondary | ICD-10-CM

## 2024-12-31 DIAGNOSIS — F900 Attention-deficit hyperactivity disorder, predominantly inattentive type: Secondary | ICD-10-CM

## 2024-12-31 DIAGNOSIS — Z0289 Encounter for other administrative examinations: Secondary | ICD-10-CM

## 2024-12-31 DIAGNOSIS — F331 Major depressive disorder, recurrent, moderate: Secondary | ICD-10-CM

## 2024-12-31 MED ORDER — AMPHETAMINE-DEXTROAMPHETAMINE 20 MG PO TABS: 20.0000 mg | ORAL_TABLET | Freq: Two times a day (BID) | ORAL | 0 refills | Status: AC

## 2024-12-31 MED ORDER — FLUOXETINE HCL 40 MG PO CAPS: 40.0000 mg | ORAL_CAPSULE | Freq: Every day | ORAL | 2 refills | Status: AC

## 2024-12-31 MED ORDER — ALPRAZOLAM 1 MG PO TABS: 1.0000 mg | ORAL_TABLET | Freq: Two times a day (BID) | ORAL | 2 refills | Status: AC | PRN

## 2024-12-31 MED ORDER — QUETIAPINE FUMARATE 100 MG PO TABS: 100.0000 mg | ORAL_TABLET | Freq: Every day | ORAL | 2 refills | Status: AC

## 2024-12-31 NOTE — Telephone Encounter (Signed)
 Pt came and picked up FMLA paper work

## 2024-12-31 NOTE — Progress Notes (Signed)
 [facility] MD/PA/NP OP Progress Note  12/31/2024 11:18 AM Sandra Glover  MRN:  [id number]  Chief Complaint:  Chief Complaint  Patient presents with   Anxiety   Depression   ADHD   HPI: This patient is a 39 year old married white female who lives with her husband and 2 children in [place] . 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  She just got out of the hospital last week after being in there 9 days for transaminitis and a urinary tract infection.  She had endoscopy which showed some gastric erosion.  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  She is frustrated because she tried to go back to work last week and they would not allow her to stay because her provider had filled out the correct FMLA forms yet.  She really wants to go back to work at a weight the surgery since she feels much better and thinks that she can concentrate fine at work.  She is a little bit more depressed about all of this but so far she is functioning fairly well.  She is getting enough sleep.  She is anxious about the work situation but the Xanax  has been helpful. Visit Diagnosis:    ICD-10-CM   1. Attention deficit hyperactivity disorder (ADHD), predominantly inattentive type  F90.0     2. PTSD (post-traumatic stress disorder)  F43.10     3. Major depressive disorder, recurrent episode, moderate (HCC)  F33.1       Past Psychiatric History: Long-term outpatient treatment  Past Medical History:  Past Medical History:  Diagnosis Date   Anxiety    Anxiety    Arthritis    Depression    History of anemia 2013   History of depression    states stopped med. 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 infection   Hypertension    Osteopenia    Pertussis    Postcoital bleeding  07/14/2015   Sinus infection 01/25/2013   started antibiotic 01/24/2013 x 10 days; nasal congestion, clear drainage from nose   Tonsillar and adenoid hypertrophy 01/2013   Vaginal irritation 03/25/2014   Mild yeast but has history of yeast, will not treat til after first trimester,try yogurt    Past Surgical History:  Procedure Laterality Date   CHOLECYSTECTOMY  04/23/2010   laparoscopic   COLONOSCOPY WITH PROPOFOL  N/A 05/17/2016   Procedure: COLONOSCOPY WITH PROPOFOL ;  Surgeon: Margo LITTIE Haddock, MD;  [facility];  Service: Endoscopy;  Laterality: N/A;  1000   ESOPHAGOGASTRODUODENOSCOPY N/A 12/16/2024   Procedure: EGD (ESOPHAGOGASTRODUODENOSCOPY);  Surgeon: Shaaron Lamar HERO, MD;  Location: [facility];  Service: Endoscopy;  Laterality: N/A;   HEMORRHOID BANDING N/A 05/17/2016   Procedure: HEMORRHOID BANDING;  Surgeon: Margo LITTIE Haddock, MD;  [facility]: [facility];  Service: Endoscopy;  Laterality: N/A;   HEMORRHOID SURGERY N/A 06/08/2016   Procedure: SIMPLE HEMORRHOIDECTOMY;  Surgeon: Selinda Artist Moats, MD;  Location: [facility];  Service: General;  Laterality: N/A;   PARTIAL NEPHRECTOMY Right age 80 mos.   TONSILLECTOMY AND ADENOIDECTOMY N/A 01/29/2013   Procedure: TONSILLECTOMY AND ADENOIDECTOMY;  Surgeon: Ana LELON Moccasin, MD;  Location: [facility];  Service: ENT;  Laterality: N/A;  Family Psychiatric History: See below  Family History:  Family History  Problem Relation Age of Onset   COPD Mother    Depression Mother    Anxiety disorder Mother    Hypertension Mother    Irritable bowel syndrome Mother    Arthritis Mother    Skin cancer Mother    Thyroid  cancer Mother    Ovarian cancer Maternal Aunt    Ovarian cancer Maternal Grandmother    Cancer Maternal Grandmother        bladder, kidney   Hypertension Father    GER disease Father    Bipolar disorder Paternal Uncle    Anxiety disorder Cousin    Drug abuse Cousin    Bipolar disorder Cousin    Colon cancer Neg  Hx     Social History:  Social History   Socioeconomic History   Marital status: Married    Spouse name: Not on file   Number of children: Not on file   Years of education: Not on file   Highest education level: Associate degree: academic program  Occupational History   Not on file  Tobacco Use   Smoking status: Never    Passive exposure: Never   Smokeless tobacco: Never   Tobacco comments:    Never smoked  Vaping Use   Vaping status: Never Used  Substance and Sexual Activity   Alcohol use: Yes    Alcohol/week: 0.0 standard drinks of alcohol    Comment: socially   Drug use: No   Sexual activity: Yes    Birth control/protection: Implant    Comment: nexplanon  implant  Other Topics Concern   Not on file  Social History Narrative   Not on file   Social Drivers of Health   Tobacco Use: Low Risk (12/31/2024)   Patient History    Smoking Tobacco Use: Never    Smokeless Tobacco Use: Never    Passive Exposure: Never  Financial Resource Strain: Medium Risk (12/11/2023)   Overall Financial Resource Strain (CARDIA)    Difficulty of Paying Living Expenses: Somewhat hard  Food Insecurity: Food Insecurity Present (12/13/2024)   Epic    Worried About Programme Researcher, Broadcasting/film/video in the Last Year: Sometimes true    Ran Out of Food in the Last Year: Patient declined  Transportation Needs: No Transportation Needs (12/13/2024)   Epic    Lack of Transportation (Medical): No    Lack of Transportation (Non-Medical): No  Physical Activity: Insufficiently Active (12/11/2023)   Exercise Vital Sign    Days of Exercise per Week: 5 days    Minutes of Exercise per Session: 20 min  Stress: Patient Declined (12/11/2023)   [employer] of Occupational Health - Occupational Stress Questionnaire    Feeling of Stress : Patient declined  Social Connections: Unknown (12/11/2023)   Social Connection and Isolation Panel    Frequency of Communication with Friends and Family: More than three times a week     Frequency of Social Gatherings with Friends and Family: Once a week    Attends Religious Services: More than 4 times per year    Active Member of Clubs or Organizations: Patient declined    Attends Banker Meetings: Not on file    Marital Status: Married  Depression (PHQ2-9): Low Risk (12/26/2024)   Depression (PHQ2-9)    PHQ-2 Score: 0  Alcohol Screen: Low Risk (12/11/2023)   Alcohol Screen    Last Alcohol Screening Score (AUDIT): 2  Housing: Low Risk (12/13/2024)   Epic  Unable to Pay for Housing in the Last Year: No    Number of Times Moved in the Last Year: 0    Homeless in the Last Year: No  Utilities: Not At Risk (12/13/2024)   Epic    Threatened with loss of utilities: No  Health Literacy: Not on file    Allergies: Allergies[1]  Metabolic Disorder Labs: Lab Results  Component Value Date   HGBA1C 5.3 12/11/2023   No results found for: PROLACTIN Lab Results  Component Value Date   CHOL 187 12/11/2023   TRIG 135 12/11/2023   HDL 60 12/11/2023   CHOLHDL 3.1 12/11/2023   LDLCALC 103 (H) 12/11/2023   LDLCALC 106 (H) 04/25/2023   Lab Results  Component Value Date   TSH 1.390 12/11/2023   TSH 1.170 04/25/2023    Therapeutic Level Labs: No results found for: LITHIUM No results found for: VALPROATE No results found for: CBMZ  Current Medications: Current Outpatient Medications  Medication Sig Dispense Refill   ALPRAZolam  (XANAX ) 1 MG tablet Take 1 tablet (1 mg total) by mouth 2 (two) times daily as needed for anxiety. 60 tablet 2   amphetamine -dextroamphetamine  (ADDERALL) 20 MG tablet Take 1 tablet (20 mg total) by mouth 2 (two) times daily. 60 tablet 0   calcium-vitamin D  (OSCAL WITH D) 500-200 MG-UNIT tablet Take 1 tablet by mouth.     docusate sodium  (COLACE) 100 MG capsule Take 1 capsule (100 mg total) by mouth daily. 30 capsule 1   Drospirenone -Estetrol  3-14.2 MG TABS Take 1 tablet by mouth daily. 28 tablet 12   etonogestrel  (NEXPLANON ) 68  MG IMPL implant 1 each by Subdermal route once.     FLUoxetine  (PROZAC ) 40 MG capsule Take 1 capsule (40 mg total) by mouth daily. 30 capsule 2   gabapentin  (NEURONTIN ) 100 MG capsule Take 1 capsule (100 mg total) by mouth 2 (two) times daily.     hyoscyamine  (LEVSIN  SL) 0.125 MG SL tablet Take 1 tablet (0.125 mg total) by mouth every 6 (six) hours as needed for cramping (abdominal pain). 45 tablet 0   linaclotide  (LINZESS ) 290 MCG CAPS capsule Take 1 capsule (290 mcg total) by mouth daily before breakfast. 30 capsule 5   methocarbamol  (ROBAXIN ) 500 MG tablet Take 1 tablet (500 mg total) by mouth every 8 (eight) hours as needed for muscle spasms (pain). 30 tablet 0   oxyCODONE  (ROXICODONE ) 5 MG immediate release tablet Take 1 tablet (5 mg total) by mouth every 6 (six) hours as needed for up to 20 doses for severe pain (pain score 7-10). 20 tablet 0   pantoprazole  (PROTONIX ) 40 MG tablet TAKE 1 TABLET(40 MG) BY MOUTH TWICE DAILY 90 tablet 3   polyethylene glycol powder (GLYCOLAX /MIRALAX ) 17 GM/SCOOP powder 1-4 scoop daily or as needed 255 g 11   QUEtiapine  (SEROQUEL ) 100 MG tablet Take 1 tablet (100 mg total) by mouth at bedtime. 30 tablet 2   simethicone  (MYLICON) 80 MG chewable tablet Chew 1 tablet (80 mg total) by mouth 3 (three) times daily before meals. 45 tablet 0   sucralfate  (CARAFATE ) 1 GM/10ML suspension Take 10 mLs (1 g total) by mouth 4 (four) times daily -  with meals and at bedtime. 420 mL 0   Vitamin D , Ergocalciferol , (DRISDOL ) 1.25 MG (50000 UNIT) CAPS capsule Take 1 capsule (50,000 Units total) by mouth every 7 (seven) days. 20 capsule 1   No current facility-administered medications for this visit.     Musculoskeletal: Strength & Muscle Tone: within  normal limits Gait & Station: normal Patient leans: N/A  Psychiatric Specialty Exam: Review of Systems  Gastrointestinal:  Positive for abdominal pain.  Psychiatric/Behavioral:  The patient is nervous/anxious.   All other  systems reviewed and are negative.   There were no vitals taken for this visit.There is no height or weight on file to calculate BMI.  General Appearance: Casual and Fairly Groomed  Eye Contact:  Good  Speech:  Clear and Coherent  Volume:  Normal  Mood:  Anxious  Affect:  Congruent  Thought Process:  Goal Directed  Orientation:  Full (Time, Place, and Person)  Thought Content: Rumination   Suicidal Thoughts:  No  Homicidal Thoughts:  No  Memory:  Immediate;   Good Recent;   Good Remote;   NA  Judgement:  Good  Insight:  Good  Psychomotor Activity:  Normal  Concentration:  Concentration: Good and Attention Span: Good  Recall:  Good  Fund of Knowledge: Good  Language: Good  Akathisia:  No  Handed:  Right  AIMS (if indicated): not done  Assets:  Communication Skills Desire for Improvement Resilience Social Support Talents/Skills  ADL's:  Intact  Cognition: WNL  Sleep:  Good   Screenings: GAD-7    Flowsheet Row Telemedicine from 12/26/2024 in [facility] Office Visit from 05/06/2024 in [facility] Primary Care Office Visit from 04/25/2023 in [facility] Primary Care Counselor from 09/27/2018 in [facility] [place]  Total GAD-7 Score 0 7 13 18    PHQ2-9    Flowsheet Row Telemedicine from 12/26/2024 in [facility] Care Office Visit from 05/06/2024 in [facility] Primary Care Office Visit from 04/25/2023 in [facility] Primary Care Video Visit from 07/13/2022 in [facility] at [place] Video Visit from 03/23/2022 in [facility] at [facility] Total Score 0 2 3 0 0  PHQ-9 Total Score 0 9 12 -- --   Flowsheet Row ED to Hosp-Admission (Discharged) from 12/12/2024 in [facility] ED from 12/10/2024 in [facility] Emergency Department at [facility] ED from 11/22/2024 in [facility]  Emergency Department at [facility]  C-SSRS RISK CATEGORY No Risk No Risk No Risk     Assessment and Plan: This patient is a 39 year old female with a history of major depression generalized anxiety ADD and possible bipolar disorder.  She is dealing with a lot of stressors right now but so far is holding her own.  She will continue Adderall 20 mg twice daily for ADD, Seroquel  100 mg twice daily for mood stabilization, Prozac  40 mg daily for depression and Xanax  1 mg twice daily as needed for anxiety.  Since she is having more difficulties right now she will return to see me in 4 weeks  Collaboration of Care: Collaboration of Care: Primary Care Provider AEB notes are shared with PCP on the epic system  Patient/Guardian was advised Release of Information must be obtained prior to any record release in order to collaborate their care with an outside provider. Patient/Guardian was advised if they have not already done so to contact the registration department to sign all necessary forms in order for us  to release information regarding their care.   Consent: Patient/Guardian gives verbal consent for treatment and assignment of benefits for services provided during this visit. Patient/Guardian expressed understanding and agreed to proceed.    Barnie Gull, MD 12/31/2024, 11:18 AM     [  1]  Allergies Allergen Reactions   Hydrocodone -Acetaminophen  Nausea Only   Lortab [Hydrocodone -Acetaminophen ] Nausea And Vomiting   Morphine And Codeine Rash

## 2025-01-01 NOTE — Telephone Encounter (Signed)
Faxed the appeal letter

## 2025-01-21 ENCOUNTER — Ambulatory Visit: Admitting: Gastroenterology

## 2025-02-06 ENCOUNTER — Ambulatory Visit: Admitting: Gastroenterology

## 2025-05-21 ENCOUNTER — Ambulatory Visit: Payer: Self-pay | Admitting: Nurse Practitioner
